# Patient Record
Sex: Female | Born: 1945 | Race: White | Hispanic: No | Marital: Single | Smoking: Never smoker
Health system: Southern US, Community
[De-identification: ages and names within clinical notes are randomized; demographics above are authoritative.]

## PROBLEM LIST (undated history)

## (undated) DIAGNOSIS — R519 Headache, unspecified: Secondary | ICD-10-CM

## (undated) DIAGNOSIS — L0390 Cellulitis, unspecified: Secondary | ICD-10-CM

## (undated) DIAGNOSIS — E039 Hypothyroidism, unspecified: Secondary | ICD-10-CM

## (undated) DIAGNOSIS — E079 Disorder of thyroid, unspecified: Secondary | ICD-10-CM

## (undated) DIAGNOSIS — K529 Noninfective gastroenteritis and colitis, unspecified: Secondary | ICD-10-CM

## (undated) DIAGNOSIS — M109 Gout, unspecified: Secondary | ICD-10-CM

## (undated) DIAGNOSIS — G8929 Other chronic pain: Secondary | ICD-10-CM

## (undated) DIAGNOSIS — E876 Hypokalemia: Secondary | ICD-10-CM

## (undated) DIAGNOSIS — R1310 Dysphagia, unspecified: Secondary | ICD-10-CM

## (undated) DIAGNOSIS — F329 Major depressive disorder, single episode, unspecified: Secondary | ICD-10-CM

## (undated) DIAGNOSIS — F419 Anxiety disorder, unspecified: Secondary | ICD-10-CM

## (undated) DIAGNOSIS — F29 Unspecified psychosis not due to a substance or known physiological condition: Secondary | ICD-10-CM

## (undated) DIAGNOSIS — M810 Age-related osteoporosis without current pathological fracture: Secondary | ICD-10-CM

## (undated) DIAGNOSIS — I82409 Acute embolism and thrombosis of unspecified deep veins of unspecified lower extremity: Secondary | ICD-10-CM

## (undated) DIAGNOSIS — L89309 Pressure ulcer of unspecified buttock, unspecified stage: Secondary | ICD-10-CM

## (undated) DIAGNOSIS — M25551 Pain in right hip: Secondary | ICD-10-CM

## (undated) DIAGNOSIS — T8859XA Other complications of anesthesia, initial encounter: Secondary | ICD-10-CM

## (undated) DIAGNOSIS — J189 Pneumonia, unspecified organism: Secondary | ICD-10-CM

## (undated) DIAGNOSIS — R112 Nausea with vomiting, unspecified: Secondary | ICD-10-CM

## (undated) DIAGNOSIS — R7881 Bacteremia: Secondary | ICD-10-CM

## (undated) DIAGNOSIS — F32A Depression, unspecified: Secondary | ICD-10-CM

## (undated) DIAGNOSIS — T4145XA Adverse effect of unspecified anesthetic, initial encounter: Secondary | ICD-10-CM

## (undated) DIAGNOSIS — D649 Anemia, unspecified: Secondary | ICD-10-CM

## (undated) DIAGNOSIS — Z9889 Other specified postprocedural states: Secondary | ICD-10-CM

## (undated) DIAGNOSIS — M549 Dorsalgia, unspecified: Secondary | ICD-10-CM

## (undated) DIAGNOSIS — K589 Irritable bowel syndrome without diarrhea: Secondary | ICD-10-CM

## (undated) DIAGNOSIS — M6281 Muscle weakness (generalized): Secondary | ICD-10-CM

## (undated) DIAGNOSIS — E785 Hyperlipidemia, unspecified: Secondary | ICD-10-CM

## (undated) DIAGNOSIS — L8990 Pressure ulcer of unspecified site, unspecified stage: Secondary | ICD-10-CM

## (undated) DIAGNOSIS — K219 Gastro-esophageal reflux disease without esophagitis: Secondary | ICD-10-CM

## (undated) HISTORY — DX: Headache, unspecified: R51.9

## (undated) HISTORY — DX: Hypothyroidism, unspecified: E03.9

## (undated) HISTORY — DX: Acute embolism and thrombosis of unspecified deep veins of unspecified lower extremity: I82.409

---

## 2000-07-04 ENCOUNTER — Encounter: Payer: Self-pay | Admitting: Rheumatology

## 2000-07-04 ENCOUNTER — Encounter (HOSPITAL_COMMUNITY): Admission: RE | Admit: 2000-07-04 | Discharge: 2000-08-03 | Payer: Self-pay | Admitting: Rheumatology

## 2000-08-29 ENCOUNTER — Encounter (HOSPITAL_COMMUNITY): Admission: RE | Admit: 2000-08-29 | Discharge: 2000-09-28 | Payer: Self-pay | Admitting: Rheumatology

## 2005-03-26 HISTORY — PX: COLONOSCOPY: SHX174

## 2005-08-03 ENCOUNTER — Ambulatory Visit: Payer: Self-pay | Admitting: Internal Medicine

## 2005-08-29 ENCOUNTER — Ambulatory Visit: Payer: Self-pay | Admitting: Internal Medicine

## 2005-08-29 ENCOUNTER — Encounter (INDEPENDENT_AMBULATORY_CARE_PROVIDER_SITE_OTHER): Payer: Self-pay | Admitting: Specialist

## 2005-08-29 ENCOUNTER — Ambulatory Visit (HOSPITAL_COMMUNITY): Admission: RE | Admit: 2005-08-29 | Discharge: 2005-08-29 | Payer: Self-pay | Admitting: Internal Medicine

## 2005-09-04 ENCOUNTER — Ambulatory Visit (HOSPITAL_COMMUNITY): Admission: RE | Admit: 2005-09-04 | Discharge: 2005-09-04 | Payer: Self-pay | Admitting: Internal Medicine

## 2006-09-12 ENCOUNTER — Ambulatory Visit (HOSPITAL_COMMUNITY): Admission: RE | Admit: 2006-09-12 | Discharge: 2006-09-12 | Payer: Self-pay | Admitting: Internal Medicine

## 2007-01-27 ENCOUNTER — Emergency Department (HOSPITAL_COMMUNITY): Admission: EM | Admit: 2007-01-27 | Discharge: 2007-01-27 | Payer: Self-pay | Admitting: Emergency Medicine

## 2007-04-14 ENCOUNTER — Emergency Department (HOSPITAL_COMMUNITY): Admission: EM | Admit: 2007-04-14 | Discharge: 2007-04-15 | Payer: Self-pay | Admitting: Emergency Medicine

## 2007-09-05 ENCOUNTER — Emergency Department (HOSPITAL_COMMUNITY): Admission: EM | Admit: 2007-09-05 | Discharge: 2007-09-05 | Payer: Self-pay | Admitting: Emergency Medicine

## 2007-10-08 ENCOUNTER — Ambulatory Visit (HOSPITAL_COMMUNITY): Admission: RE | Admit: 2007-10-08 | Discharge: 2007-10-08 | Payer: Self-pay | Admitting: Internal Medicine

## 2008-03-25 ENCOUNTER — Ambulatory Visit (HOSPITAL_COMMUNITY): Admission: RE | Admit: 2008-03-25 | Discharge: 2008-03-25 | Payer: Self-pay | Admitting: Internal Medicine

## 2008-10-19 ENCOUNTER — Ambulatory Visit (HOSPITAL_COMMUNITY): Admission: RE | Admit: 2008-10-19 | Discharge: 2008-10-19 | Payer: Self-pay | Admitting: Internal Medicine

## 2008-10-28 ENCOUNTER — Ambulatory Visit (HOSPITAL_COMMUNITY): Admission: RE | Admit: 2008-10-28 | Discharge: 2008-10-28 | Payer: Self-pay | Admitting: Internal Medicine

## 2010-01-04 ENCOUNTER — Emergency Department (HOSPITAL_COMMUNITY): Admission: EM | Admit: 2010-01-04 | Discharge: 2010-01-04 | Payer: Self-pay | Admitting: Emergency Medicine

## 2010-01-04 ENCOUNTER — Encounter: Payer: Self-pay | Admitting: Orthopedic Surgery

## 2010-01-09 ENCOUNTER — Ambulatory Visit: Payer: Self-pay | Admitting: Orthopedic Surgery

## 2010-01-09 DIAGNOSIS — S52123A Displaced fracture of head of unspecified radius, initial encounter for closed fracture: Secondary | ICD-10-CM | POA: Insufficient documentation

## 2010-01-09 DIAGNOSIS — S53106A Unspecified dislocation of unspecified ulnohumeral joint, initial encounter: Secondary | ICD-10-CM | POA: Insufficient documentation

## 2010-01-16 ENCOUNTER — Encounter: Payer: Self-pay | Admitting: Orthopedic Surgery

## 2010-01-23 ENCOUNTER — Ambulatory Visit: Payer: Self-pay | Admitting: Orthopedic Surgery

## 2010-01-24 ENCOUNTER — Encounter: Payer: Self-pay | Admitting: Orthopedic Surgery

## 2010-01-31 ENCOUNTER — Encounter: Payer: Self-pay | Admitting: Orthopedic Surgery

## 2010-03-11 ENCOUNTER — Emergency Department (HOSPITAL_COMMUNITY)
Admission: EM | Admit: 2010-03-11 | Discharge: 2010-03-11 | Payer: Self-pay | Source: Home / Self Care | Admitting: Emergency Medicine

## 2010-04-25 NOTE — Letter (Signed)
Summary: History form  History form   Imported By: Jacklynn Ganong 01/16/2010 12:10:40  _____________________________________________________________________  External Attachment:    Type:   Image     Comment:   External Document

## 2010-04-25 NOTE — Miscellaneous (Signed)
Summary: Nursing home order  Nursing home order   Imported By: Jacklynn Ganong 01/24/2010 11:26:32  _____________________________________________________________________  External Attachment:    Type:   Image     Comment:   External Document

## 2010-04-25 NOTE — Miscellaneous (Signed)
Summary: Nursing Home order  Nursing Home order   Imported By: Cammie Sickle 01/23/2010 17:40:19  _____________________________________________________________________  External Attachment:    Type:   Image     Comment:   External Document

## 2010-04-25 NOTE — Miscellaneous (Signed)
Summary: order to discontinue medication  order to discontinue medication   Imported By: Jacklynn Ganong 01/31/2010 14:49:37  _____________________________________________________________________  External Attachment:    Type:   Image     Comment:   External Document

## 2010-04-25 NOTE — Miscellaneous (Signed)
Summary: wrote dc for sling and faxed to [facility]  Clinical Lists Changes

## 2010-04-25 NOTE — Assessment & Plan Note (Signed)
Summary: [facility] FOL/UP/FX RT ELBOW/XRAY [facility] 01/04/10/MEDICAID/CAF   Visit Type:  new patient Referring Provider:  [facility]  CC:  right elbow fracture.  History of Present Illness: this is a 1st visit for this 65 year old female, [facility]. She fell on October 13, landed on her RIGHT elbow. The x-ray shows a radial head fracture, nondisplaced.  She complains of throbbing, burning, severe pain and swelling over the RIGHT elbow.  X-rays been, reviewed it's a neck fracture with minimal to no angulation or displacement.  She denies any shoulder pain or wrist pain.  The splint was a little tight, and was starting to interfere with her circulation.  Blood one Synthes plate was removed. The hand pinked up nicely.    Allergies (verified): No Known Drug Allergies  Past History:  Past Medical History: Depression IBS GERD Gout  Past Surgical History: Sinus surgery  Cholecystectomy  Review of Systems Constitutional:  Denies weight loss, weight gain, fever, chills, and fatigue. Cardiovascular:  Denies chest pain, palpitations, fainting, and murmurs. Respiratory:  Denies short of breath, wheezing, couch, tightness, pain on inspiration, and snoring . Gastrointestinal:  Complains of heartburn, diarrhea, and constipation; denies nausea, vomiting, and blood in your stools. Genitourinary:  Denies frequency, urgency, difficulty urinating, painful urination, flank pain, and bleeding in urine. Neurologic:  Complains of dizziness; denies numbness, tingling, unsteady gait, tremors, and seizure. Musculoskeletal:  Complains of joint pain and swelling; denies instability, stiffness, redness, heat, and muscle pain. Endocrine:  Denies excessive thirst, exessive urination, and heat or cold intolerance. Psychiatric:  Complains of nervousness, depression, and anxiety; denies hallucinations. Skin:  Denies changes in the skin, poor healing, rash, itching, and redness. HEENT:  Denies  blurred or double vision, eye pain, redness, and watering. Immunology:  Denies seasonal allergies, sinus problems, and allergic to bee stings. Hemoatologic:  Denies easy bleeding and brusing.  Physical Exam  Additional Exam:  normal development. Adequate grooming, and hygiene. No gross deformity. Patient is found to be in a sling and sugar tong splint in the RIGHT upper extremity.  The radial head is tender with the arm has approximately 50 of elbow flexion. It does not quite come to full extension. There is no instability. Muscle tone is normal. There is a skin abrasion, but no open fracture. The pulse and temperature of the arm are normal, including the radial and ulnar pulse. There is no lymphadenopathy in the axilla. Sensation is normal in the hand. The patient is oriented x3. Her mood is pleasant. Her gait is slow, but normal.  LOWER EXTREMS: Normal alignment and no atrophy, subluxation or tremor or contracture       Impression & Recommendations:  Problem # 1:  CLOSED FRACTURE OF HEAD OF RADIUS (ZOX-096.04) Assessment New  hospital films are as dictated.  Recommend sling for 2 weeks, then x-rayed, and active range of motion as tolerated.  Orders: New Patient Level III (54098) Rad Head Fx (11914)  Patient Instructions: 1)  Please schedule a follow-up appointment in 2 weeks. 2)  xrays of the elbow    Orders Added: 1)  New Patient Level III [99203] 2)  Rad Head Fx [24650]

## 2010-04-25 NOTE — Assessment & Plan Note (Signed)
Summary: 2 WE XR ELBOW/MEDICAID/BSF   Visit Type:  Follow-up Referring Provider:  [facility]  CC:  right elbow fracture.  History of Present Illness:   Xrays today.  ZO:[id number], date of injury October 13  Treatment:sling  MEDS: hydrocodone  Complaints:RIGHT elbow soreness  Today, scheduled for:x-ray, examination  Examination reveals full passive range of motion of the elbow with tenderness over the radial head  AP and lateral RIGHT elbow x-ray show nondisplaced radial head fracture impression nondisplaced radial head fracture in good position  Assessment start range of motion exercises for this nondisplaced RIGHT radial head fracture.  Allergies: No Known Drug Allergies   Other Orders: Post-Op Check (09811) Elbow x-ray, 2 views (91478)  Patient Instructions: 1)  Please schedule a follow-up appointment as needed.   Orders Added: 1)  Post-Op Check [99024] 2)  Elbow x-ray, 2 views [73070]

## 2010-04-25 NOTE — Miscellaneous (Signed)
Summary: Nursing Home Order from Dr. Romeo Apple  Nursing Home Order from Dr. Romeo Apple   Imported By: Jacklynn Ganong 01/16/2010 12:16:07  _____________________________________________________________________  External Attachment:    Type:   Image     Comment:   External Document

## 2010-05-28 ENCOUNTER — Emergency Department (HOSPITAL_COMMUNITY): Payer: Medicare Other

## 2010-05-28 ENCOUNTER — Emergency Department (HOSPITAL_COMMUNITY)
Admission: EM | Admit: 2010-05-28 | Discharge: 2010-05-28 | Disposition: A | Discharge status: Home / Self Care | Payer: Medicare Other | Attending: Emergency Medicine | Admitting: Emergency Medicine

## 2010-05-28 DIAGNOSIS — Z043 Encounter for examination and observation following other accident: Secondary | ICD-10-CM | POA: Insufficient documentation

## 2010-05-28 DIAGNOSIS — K219 Gastro-esophageal reflux disease without esophagitis: Secondary | ICD-10-CM | POA: Insufficient documentation

## 2010-05-28 DIAGNOSIS — W1809XA Striking against other object with subsequent fall, initial encounter: Secondary | ICD-10-CM | POA: Insufficient documentation

## 2010-05-28 DIAGNOSIS — Z79899 Other long term (current) drug therapy: Secondary | ICD-10-CM | POA: Insufficient documentation

## 2010-05-28 DIAGNOSIS — Y9210 Unspecified residential institution as the place of occurrence of the external cause: Secondary | ICD-10-CM | POA: Insufficient documentation

## 2010-05-28 DIAGNOSIS — S2249XA Multiple fractures of ribs, unspecified side, initial encounter for closed fracture: Secondary | ICD-10-CM | POA: Insufficient documentation

## 2010-05-28 DIAGNOSIS — F329 Major depressive disorder, single episode, unspecified: Secondary | ICD-10-CM | POA: Insufficient documentation

## 2010-05-28 DIAGNOSIS — F3289 Other specified depressive episodes: Secondary | ICD-10-CM | POA: Insufficient documentation

## 2010-05-28 LAB — URINALYSIS, ROUTINE W REFLEX MICROSCOPIC
Nitrite: NEGATIVE
Specific Gravity, Urine: 1.01 (ref 1.005–1.030)
pH: 6 (ref 5.0–8.0)

## 2010-05-28 LAB — POCT CARDIAC MARKERS
CKMB, poc: <1 ng/mL — ABNORMAL LOW (ref 1.0–8.0)
Troponin i, poc: <0.05 ng/mL (ref 0.00–0.09)

## 2010-05-28 LAB — BASIC METABOLIC PANEL
GFR calc non Af Amer: >60 mL/min (ref >60)
Potassium: 3.7 mEq/L (ref 3.5–5.1)
Sodium: 141 mEq/L (ref 135–145)

## 2010-05-28 LAB — CBC
HCT: 35.8 % — ABNORMAL LOW (ref 36.0–46.0)
MCHC: 33 g/dL (ref 30.0–36.0)
MCV: 90.2 fL (ref 78.0–100.0)
Platelets: 319 10*3/uL (ref 150–400)
RDW: 13.7 % (ref 11.5–15.5)

## 2010-05-28 LAB — DIFFERENTIAL
Basophils Absolute: 0 10*3/uL (ref 0.0–0.1)
Eosinophils Absolute: 0.1 10*3/uL (ref 0.0–0.7)
Eosinophils Relative: 2 % (ref 0–5)
Lymphocytes Relative: 18 % (ref 12–46)
Lymphs Abs: 1.1 10*3/uL (ref 0.7–4.0)
Monocytes Absolute: 0.7 10*3/uL (ref 0.1–1.0)

## 2010-06-19 ENCOUNTER — Other Ambulatory Visit (HOSPITAL_COMMUNITY): Payer: Self-pay | Admitting: Internal Medicine

## 2010-06-19 ENCOUNTER — Ambulatory Visit (HOSPITAL_COMMUNITY)
Admission: RE | Admit: 2010-06-19 | Discharge: 2010-06-19 | Disposition: A | Discharge status: Home / Self Care | Payer: Medicare Other | Source: Ambulatory Visit | Attending: Internal Medicine | Admitting: Internal Medicine

## 2010-06-19 DIAGNOSIS — M25559 Pain in unspecified hip: Secondary | ICD-10-CM | POA: Insufficient documentation

## 2010-08-11 NOTE — Op Note (Signed)
NAME:  [name], [name]               ACCOUNT NO.:  [id number]   MEDICAL RECORD NO.:  [id number]          PATIENT TYPE:  AMB   LOCATION:  DAY                           FACILITY:  [facility]   PHYSICIAN:  R. Roetta Sessions, M.D. DATE OF BIRTH:  Aug 13, 1945   DATE OF PROCEDURE:  08/29/2005  DATE OF DISCHARGE:                                 OPERATIVE REPORT   PROCEDURE PERFORMED:  Colonoscopy with biopsy, ileoscopy.   INDICATIONS FOR PROCEDURE:  [name] is a 65 year old lady with chronic  diarrhea, intermittent hematochezia, history of colonic adenomatous polyps.  She tells me today what is bothering her the most is she has dysuria and  increased urinary frequency.  She was started on Bentyl 10 mg by mouth each  morning with no improvement in her symptoms recently.  Colonoscopy is now  being done primarily to further evaluate hematochezia.  This approach has been discussed with the patient at length.  Potential  risks, benefits and alternatives have been reviewed, questions have been  answered, she is agreeable.  Please see documentation in the medical  records.   PROCEDURE NOTE:  Oxygen saturations, blood pressure, pulse and respirations  were monitored throughout the entire procedure.   CONSCIOUS SEDATION:  Versed 2 mg IV, Demerol 50 mg IV.   INSTRUMENT USED:  Olympus video chip system.   FINDINGS:  Digital rectal exam revealed no abnormalities.   ENDOSCOPIC FINDINGS:  Prep was good.   Rectum:  Examination of the rectal mucosa including retroflex view of the  anal verge revealed only internal hemorrhoids.  Colon:  Colonic mucosa was surveyed from the rectosigmoid junction to the  left, transverse and right colon to the area of the appendiceal orifice and  ileocecal valve and cecum.  These structures were well seen and photographed  for the record.  The terminal ileum was intubated to 10 cm.  From this  level, the scope was slowly withdrawn.  All previously mentioned mucosal  surfaces were  again seen.  The colonic mucosa appeared entirely normal  except for two adjacent 5 mm polyps at 40 cm.  They were cold  biopsied/removed.  The remainder of the colonic mucosa, distal 10 cm of  terminal ileal mucosa appeared entirely normal.  The patient tolerated the  procedure well, was reacted in endoscopy.   IMPRESSION:  1.  Internal hemorrhoids, otherwise normal rectum.  2.  Diminutive polyps, middescending colon at 40 cm.  Cold biopsied/removed.      Remainder of colonic mucosa to terminal ileum appeared normal.   RECOMMENDATIONS:  1.  Hemorrhoid literature provided to [name].  2.  10-day course of Anusol HC suppositories 1 per rectum at bedtime.  3.  Follow up on pathology.  4.  Further recommendations to follow.      Jonathon Bellows, M.D.  Electronically Signed     RMR/MEDQ  D:  08/29/2005  T:  08/29/2005  Job:  [id number]

## 2010-08-11 NOTE — Consult Note (Signed)
[name], [name]               ACCOUNT NO.:  [id number]   MEDICAL RECORD NO.:  [id number]            PATIENT TYPE:   LOCATION:                                 FACILITY:   PHYSICIAN:  R. Roetta Sessions, M.D.      DATE OF BIRTH:   DATE OF CONSULTATION:  DATE OF DISCHARGE:                                   CONSULTATION   REQUESTING PHYSICIAN:  Dr. Virgina Organ.   REASON FOR CONSULTATION:  Chronic diarrhea.   HISTORY OF PRESENT ILLNESS:  [name] is a 65 year old Caucasian female with  significant psychiatric illness who comes today accompanied by case worker  from mental health. She presents for chronic diarrhea. She is a resident of  [facility] at this time. She states she has had chronic diarrhea  for years. When we saw her back in 2003, she was complaining of the same. In  July 2003, she had a colonoscopy which revealed three small polyps with  tubular adenomas, small external hemorrhoids. Random biopsies were  unremarkable. She also had EGD at the time which revealed small sliding  hiatal hernia, normal esophagus, but her esophagus was stretched with the 54-  [place] Maloney dilator given history of solid-food dysphagia. She said she  is having two to three loose stools a day. She does occasionally not have  diarrhea. She complains of hematochezia. She has abdominal cramping relieved  with defecation. She complains of significant post-prandial diarrhea. She  says she has lost some weight but is not well documented. She does have some  occasional heartburn. Complains of dysuria which has been worked up  extensively with Dr. Virgina Organ. Denies any dysphagia or odynophagia.   CURRENT MEDICATIONS:  1.  Xanax 0.5 mg t.i.d.  2.  Klonopin 0.5 mg t.i.d.  3.  Detrol 4 mg daily.  4.  Levsin 0.25 mg b.i.d.  5.  Premarin vaginally twice a week.  6.  Protonix 40 mg daily.  7.  Zyprexa 15 mg nightly.  8.  Tylenol 2 t.i.d. p.r.n.  9.  Imodium AD 2 p.o. p.r.n.   ALLERGIES:  BENADRYL.   PAST MEDICAL HISTORY:  1.  Major depression, mood disorder, obsessive compulsive disorder.  2.  Vaginitis.  3.  Gastroesophageal reflux disease.  4.  Anxiety.  5.  History of UTIs.  6.  History of IBS.  7.  History of hypothyroidism although not on treatment at this time.  8.  She has had prior sinus surgery.  9.  Bilateral cataract extraction.  10. Cholecystectomy.   SOCIAL HISTORY:  She is divorced. She is on disability secondary to  psychiatric illness. She lives at [facility]. She has a son. Quit  smoking about a year ago. Was smoking two to three packs a day previously.  No alcohol use.   FAMILY HISTORY:  Negative for colorectal cancer. Brother died of lung  cancer.   PHYSICAL EXAMINATION:  VITAL SIGNS:  Weight 131.5, height 5 foot 7,  temperature 98.1, blood pressure 118/68, pulse 76.  GENERAL:  Pleasant, thin, Caucasian female in no acute distress.  SKIN:  Warm and dry. No jaundice.  HEENT:  Conjunctivae are pink. Sclerae are nonicteric. Oropharyngeal mucosa  moist and pink. No lesions, erythema or exudate. No lymphadenopathy or  thyromegaly.  CHEST:  Lungs are clear to auscultation.  CARDIAC:  Reveals regular rate and rhythm. Normal S1 and S2. No murmurs,  rubs, or gallops.  ABDOMEN:  Positive bowel sounds, soft, nontender, nondistended. No  organomegaly or masses. No rebound tenderness or guarding. No abdominal  bruits or hernias.  EXTREMITIES:  No edema.   IMPRESSION:  [name] is a 65 year old lady with chronic diarrhea and  history of intermittent hematochezia. Last colonoscopy was in July of 2003  as outlined above. She did have tubular adenomas at the time. Biopsies were  negative for microscopic colitis. Not mentioned above, she evaluated back in  March 2007 for diarrhea by Dr. Virgina Organ. O&P was negative. She had a  Helicobacter pylori stool antigen which was negative as well, but other  stool studies I do not have any results for, if they were  done. This may  very well be IBS with hematochezia due to hemorrhoids, but with history of  tubular adenomas, we do need to consider colonoscopy at this time. Cannot  exclude possibility of infectious enteritis, given that she lives in a rest  home.   PLAN:  1.  Colonoscopy in the near future.  2.  Will stop her Levsin and trial Bentyl 10 mg p.o. q.a.c., a prescription      for #90, three refills given.  3.  Further recommendations to follow.   Procedure to be performed by Dr. Karilyn Cota, Dr. Jena Gauss co-signer in Dr. Patty Sermons  absence.      Tana Coast, P.AJonathon Bellows, M.D.  Electronically Signed    LL/MEDQ  D:  08/03/2005  T:  08/03/2005  Job:  [id number]

## 2010-12-14 LAB — URINE CULTURE: Colony Count: 10000

## 2010-12-14 LAB — URINALYSIS, ROUTINE W REFLEX MICROSCOPIC
Bilirubin Urine: NEGATIVE
Nitrite: POSITIVE — AB
Specific Gravity, Urine: 1.025
Urobilinogen, UA: 0.2

## 2010-12-14 LAB — URINE MICROSCOPIC-ADD ON

## 2010-12-21 LAB — URIC ACID: Uric Acid, Serum: 2.8

## 2011-01-02 LAB — DIFFERENTIAL
Eosinophils Relative: 3
Lymphocytes Relative: 19
Lymphs Abs: 1.3
Monocytes Absolute: 0.5
Monocytes Relative: 7
Neutro Abs: 5

## 2011-01-02 LAB — COMPREHENSIVE METABOLIC PANEL
BUN: 22
Calcium: 9.1
Creatinine, Ser: 0.69
Glucose, Bld: 107 — ABNORMAL HIGH
Total Protein: 6.3

## 2011-01-02 LAB — URINALYSIS, ROUTINE W REFLEX MICROSCOPIC
Bilirubin Urine: NEGATIVE
Ketones, ur: NEGATIVE
Nitrite: NEGATIVE
Specific Gravity, Urine: 1.02
Urobilinogen, UA: 0.2

## 2011-01-02 LAB — CBC
HCT: 32.7 — ABNORMAL LOW
Hemoglobin: 11.2 — ABNORMAL LOW
MCHC: 34.1
MCV: 88.4
RDW: 14.2 — ABNORMAL HIGH

## 2011-01-02 LAB — URINE MICROSCOPIC-ADD ON

## 2011-03-29 DIAGNOSIS — K589 Irritable bowel syndrome without diarrhea: Secondary | ICD-10-CM

## 2011-03-29 DIAGNOSIS — K219 Gastro-esophageal reflux disease without esophagitis: Secondary | ICD-10-CM

## 2011-03-29 DIAGNOSIS — R32 Unspecified urinary incontinence: Secondary | ICD-10-CM

## 2011-03-29 DIAGNOSIS — R3911 Hesitancy of micturition: Secondary | ICD-10-CM

## 2011-03-29 DIAGNOSIS — R159 Full incontinence of feces: Secondary | ICD-10-CM

## 2011-04-03 DIAGNOSIS — R32 Unspecified urinary incontinence: Secondary | ICD-10-CM

## 2011-04-03 DIAGNOSIS — R3911 Hesitancy of micturition: Secondary | ICD-10-CM

## 2011-04-03 DIAGNOSIS — K219 Gastro-esophageal reflux disease without esophagitis: Secondary | ICD-10-CM

## 2011-04-03 DIAGNOSIS — K589 Irritable bowel syndrome without diarrhea: Secondary | ICD-10-CM

## 2011-04-03 DIAGNOSIS — R159 Full incontinence of feces: Secondary | ICD-10-CM

## 2011-04-29 ENCOUNTER — Inpatient Hospital Stay (HOSPITAL_COMMUNITY)
Admission: EM | Admit: 2011-04-29 | Discharge: 2011-05-04 | DRG: 470 | Disposition: A | Discharge status: Skilled Nursing Facility | Payer: Medicare Other | Attending: Internal Medicine | Admitting: Internal Medicine

## 2011-04-29 ENCOUNTER — Emergency Department (HOSPITAL_COMMUNITY): Payer: Medicare Other

## 2011-04-29 ENCOUNTER — Encounter (HOSPITAL_COMMUNITY): Payer: Self-pay

## 2011-04-29 DIAGNOSIS — S72001A Fracture of unspecified part of neck of right femur, initial encounter for closed fracture: Secondary | ICD-10-CM | POA: Diagnosis present

## 2011-04-29 DIAGNOSIS — K589 Irritable bowel syndrome without diarrhea: Secondary | ICD-10-CM | POA: Diagnosis present

## 2011-04-29 DIAGNOSIS — D649 Anemia, unspecified: Secondary | ICD-10-CM | POA: Diagnosis not present

## 2011-04-29 DIAGNOSIS — K219 Gastro-esophageal reflux disease without esophagitis: Secondary | ICD-10-CM | POA: Diagnosis present

## 2011-04-29 DIAGNOSIS — S72009A Fracture of unspecified part of neck of unspecified femur, initial encounter for closed fracture: Principal | ICD-10-CM | POA: Diagnosis present

## 2011-04-29 DIAGNOSIS — W1809XA Striking against other object with subsequent fall, initial encounter: Secondary | ICD-10-CM | POA: Diagnosis present

## 2011-04-29 DIAGNOSIS — E876 Hypokalemia: Secondary | ICD-10-CM | POA: Diagnosis not present

## 2011-04-29 DIAGNOSIS — M109 Gout, unspecified: Secondary | ICD-10-CM | POA: Diagnosis present

## 2011-04-29 DIAGNOSIS — F341 Dysthymic disorder: Secondary | ICD-10-CM | POA: Diagnosis present

## 2011-04-29 DIAGNOSIS — Y9210 Unspecified residential institution as the place of occurrence of the external cause: Secondary | ICD-10-CM | POA: Diagnosis present

## 2011-04-29 DIAGNOSIS — Z23 Encounter for immunization: Secondary | ICD-10-CM

## 2011-04-29 DIAGNOSIS — W19XXXA Unspecified fall, initial encounter: Secondary | ICD-10-CM | POA: Diagnosis present

## 2011-04-29 HISTORY — DX: Irritable bowel syndrome, unspecified: K58.9

## 2011-04-29 HISTORY — DX: Major depressive disorder, single episode, unspecified: F32.9

## 2011-04-29 HISTORY — DX: Gastro-esophageal reflux disease without esophagitis: K21.9

## 2011-04-29 HISTORY — DX: Depression, unspecified: F32.A

## 2011-04-29 MED ORDER — ONDANSETRON HCL 4 MG/2ML IJ SOLN
4.0000 mg | Freq: Once | INTRAMUSCULAR | Status: AC
  Administered 2011-04-30: 4 mg via INTRAVENOUS
  Filled 2011-04-29: qty 2

## 2011-04-29 MED ORDER — MORPHINE SULFATE 2 MG/ML IJ SOLN
2.0000 mg | Freq: Once | INTRAMUSCULAR | Status: AC
  Administered 2011-04-30: 2 mg via INTRAVENOUS
  Filled 2011-04-29: qty 1

## 2011-04-29 NOTE — ED Notes (Signed)
Pt presents with head pain, right arm and leg pain after falling. Pt is a [employer] pt.

## 2011-04-29 NOTE — ED Provider Notes (Signed)
History     CSN: [id number]  Arrival date & time 04/29/11  2107   First MD Initiated Contact with Patient 04/29/11 2144      Chief Complaint  Patient presents with  . Fall  . Head Injury  . Leg Pain  . Arm Pain    (Consider location/radiation/quality/duration/timing/severity/associated sxs/prior treatment) HPI Comments: Patient presents for evaluation of a fall that she took just prior to arrival this evening. She describes trying to put on her hands in order to walk to the bathroom, tripped and landed against her were wardrobe. She has pain in her her right shoulder, right upper thigh and hip, and she reports hitting her right head on the wardrobe, but denies head pain. She denies loss of consciousness, dizziness, nausea or vomiting. She states she cannot bear weight on her right leg without severe pain. Pain is constant but worse with movement.    Patient is a 66 y.o. female presenting with fall, head injury, leg pain, and arm pain. The history is provided by the patient.  Fall Pertinent negatives include no fever, no numbness, no abdominal pain, no nausea and no headaches.  Head Injury  Pertinent negatives include no numbness and no weakness.  Leg Pain  Pertinent negatives include no numbness.  Arm Pain Associated symptoms include arthralgias. Pertinent negatives include no abdominal pain, chest pain, congestion, fever, headaches, joint swelling, nausea, neck pain, numbness, rash, sore throat or weakness.    Past Medical History  Diagnosis Date  . IBS (irritable bowel syndrome)   . Depression   . GERD (gastroesophageal reflux disease)   . Gout     History reviewed. No pertinent past surgical history.  No family history on file.  History  Substance Use Topics  . Smoking status: Never Smoker   . Smokeless tobacco: Not on file  . Alcohol Use: No    OB History    Grav Para Term Preterm Abortions TAB SAB Ect Mult Living                  Review of Systems    Constitutional: Negative for fever.  HENT: Negative for congestion, sore throat and neck pain.   Eyes: Negative.   Respiratory: Negative for chest tightness and shortness of breath.   Cardiovascular: Negative for chest pain.  Gastrointestinal: Negative for nausea and abdominal pain.  Genitourinary: Negative.   Musculoskeletal: Positive for arthralgias. Negative for joint swelling.  Skin: Negative.  Negative for rash and wound.  Neurological: Negative for dizziness, weakness, light-headedness, numbness and headaches.  Hematological: Negative.   Psychiatric/Behavioral: Negative.     Allergies  Review of patient's allergies indicates no known allergies.  Home Medications  No current outpatient prescriptions on file.  BP 99/60  Pulse 56  Temp(Src) 97.5 F (36.4 C) (Oral)  Resp 18  Ht 5\' 7"  (1.702 m)  Wt 150 lb (68.04 kg)  BMI 23.49 kg/m2  SpO2 98%  Physical Exam  Nursing note and vitals reviewed. Constitutional: She is oriented to person, place, and time. She appears well-developed and well-nourished.  HENT:  Head: Normocephalic and atraumatic.  Eyes: Conjunctivae are normal.  Neck: Normal range of motion.  Cardiovascular: Normal rate, regular rhythm, normal heart sounds and intact distal pulses.   Pulmonary/Chest: Effort normal and breath sounds normal. She has no wheezes.  Abdominal: Soft. Bowel sounds are normal. There is no tenderness.  Musculoskeletal: She exhibits tenderness.       Right shoulder: She exhibits tenderness.  Right hip: She exhibits tenderness. She exhibits no swelling.       Right leg is shortened and externally rotated. Bilateral pedal pulses are intact.  Neurological: She is alert and oriented to person, place, and time.  Skin: Skin is warm and dry.  Psychiatric: She has a normal mood and affect.    ED Course  Procedures (including critical care time)  Results for orders placed during the hospital encounter of 04/29/11  CBC       Component Value Range   WBC 9.1  4.0 - 10.5 (K/uL)   RBC 4.16  3.87 - 5.11 (MIL/uL)   Hemoglobin 12.5  12.0 - 15.0 (g/dL)   HCT 78.2  95.6 - 21.3 (%)   MCV 90.6  78.0 - 100.0 (fL)   MCH 30.0  26.0 - 34.0 (pg)   MCHC 33.2  30.0 - 36.0 (g/dL)   RDW 08.6  57.8 - 46.9 (%)   Platelets 231  150 - 400 (K/uL)  DIFFERENTIAL      Component Value Range   Neutrophils Relative 83 (*) 43 - 77 (%)   Neutro Abs 7.5  1.7 - 7.7 (K/uL)   Lymphocytes Relative 11 (*) 12 - 46 (%)   Lymphs Abs 1.0  0.7 - 4.0 (K/uL)   Monocytes Relative 5  3 - 12 (%)   Monocytes Absolute 0.4  0.1 - 1.0 (K/uL)   Eosinophils Relative 1  0 - 5 (%)   Eosinophils Absolute 0.1  0.0 - 0.7 (K/uL)   Basophils Relative 0  0 - 1 (%)   Basophils Absolute 0.0  0.0 - 0.1 (K/uL)  BASIC METABOLIC PANEL      Component Value Range   Sodium 134 (*) 135 - 145 (mEq/L)   Potassium 3.5  3.5 - 5.1 (mEq/L)   Chloride 98  96 - 112 (mEq/L)   CO2 25  19 - 32 (mEq/L)   Glucose, Bld 120 (*) 70 - 99 (mg/dL)   BUN 7  6 - 23 (mg/dL)   Creatinine, Ser 6.29  0.50 - 1.10 (mg/dL)   Calcium 9.7  8.4 - 52.8 (mg/dL)   GFR calc non Af Amer 65 (*) >90 (mL/min)   GFR calc Af Amer 76 (*) >90 (mL/min)  PROTIME-INR      Component Value Range   Prothrombin Time 13.4  11.6 - 15.2 (seconds)   INR 1.00  0.00 - 1.49       Dg Chest 1 View  04/29/2011  *RADIOLOGY REPORT*  Clinical Data: Larey Seat, contusion.  CHEST - 1 VIEW  Comparison: 05/28/2010  Findings: Old left rib fractures.  Lungs clear.  Heart size upper limits normal.  No effusion.  No pneumothorax.  IMPRESSION:  1.  No acute disease.  Original Report Authenticated By: Osa Craver, M.D.   Dg Shoulder Right  04/29/2011  *RADIOLOGY REPORT*  Clinical Data: Pain post fall.  RIGHT SHOULDER - 2+ VIEW  Comparison: None.  Findings: Negative for fracture, dislocation, or other acute abnormality.  Normal alignment and mineralization. No significant degenerative change.  Regional soft tissues unremarkable.   IMPRESSION:  Negative  Original Report Authenticated By: Osa Craver, M.D.   Dg Hip Complete Right  04/29/2011  *RADIOLOGY REPORT*  Clinical Data: Right hip pain status post fall  RIGHT HIP - COMPLETE 2+ VIEW  Comparison: 06/19/2010  Findings: Right femoral neck fracture with proximal migration of the distal component.  The femoral head remains seated within the acetabulum.  The pubic  rami remain intact.  No aggressive osseous lesion identified.  IMPRESSION: Right femoral neck fracture.  Original Report Authenticated By: Waneta Martins, M.D.   Ct Head Wo Contrast  04/29/2011  *RADIOLOGY REPORT*  Clinical Data: Status post fall, head pain.  CT HEAD WITHOUT CONTRAST,CT CERVICAL SPINE WITHOUT CONTRAST  Technique:  Contiguous axial images were obtained from the base of the skull through the vertex without contrast.,Technique: Multidetector CT imaging of the cervical spine was performed. Multiplanar CT image reconstructions were also generated.  Comparison: None.  Findings:  Head: Prominence of the sulci, cisterns, and ventricles, in keeping with volume loss. There are subcortical and periventricular white matter hypodensities, a nonspecific finding most often seen with chronic microangiopathic changes.  There is no evidence for acute hemorrhage, overt hydrocephalus, mass lesion, or abnormal extra-axial fluid collection.  No definite CT evidence for acute cortical based (large artery) infarction. The visualized paranasal sinuses and mastoid air cells are predominately clear.  No displaced calvarial fracture.  Cervical spine:  C1 bone island.  No acute fracture or dislocation. Maintained craniocervical relationship.  Mild multilevel degenerative changes with anterior osteophyte formation at C5-6. No central canal narrowing.  Lung apices are clear.  No prevertebral soft tissue swelling.  IMPRESSION: White matter hypodensities, a nonspecific finding most often seen with chronic microangiopathic change.  No  definite acute intracranial abnormality.  No acute fracture or dislocation of the cervical spine.  Original Report Authenticated By: Waneta Martins, M.D.   Ct Cervical Spine Wo Contrast  04/29/2011  *RADIOLOGY REPORT*  Clinical Data: Status post fall, head pain.  CT HEAD WITHOUT CONTRAST,CT CERVICAL SPINE WITHOUT CONTRAST  Technique:  Contiguous axial images were obtained from the base of the skull through the vertex without contrast.,Technique: Multidetector CT imaging of the cervical spine was performed. Multiplanar CT image reconstructions were also generated.  Comparison: None.  Findings:  Head: Prominence of the sulci, cisterns, and ventricles, in keeping with volume loss. There are subcortical and periventricular white matter hypodensities, a nonspecific finding most often seen with chronic microangiopathic changes.  There is no evidence for acute hemorrhage, overt hydrocephalus, mass lesion, or abnormal extra-axial fluid collection.  No definite CT evidence for acute cortical based (large artery) infarction. The visualized paranasal sinuses and mastoid air cells are predominately clear.  No displaced calvarial fracture.  Cervical spine:  C1 bone island.  No acute fracture or dislocation. Maintained craniocervical relationship.  Mild multilevel degenerative changes with anterior osteophyte formation at C5-6. No central canal narrowing.  Lung apices are clear.  No prevertebral soft tissue swelling.  IMPRESSION: White matter hypodensities, a nonspecific finding most often seen with chronic microangiopathic change.  No definite acute intracranial abnormality.  No acute fracture or dislocation of the cervical spine.  Original Report Authenticated By: Waneta Martins, M.D.     1. Hip fracture       MDM  Spoke with Dr. Romeo Apple regarding patient's injury. He will consult with this patient, requests medical admission.  Dr. Colon Branch spoke with Dr. Onalee Hua with [facility] hospitalist, who will admit this  patient.        Candis Musa, PA 04/30/11 0006    Date: 04/30/2011  Rate: 66  Rhythm: normal sinus rhythm  QRS Axis: normal  Intervals: normal  ST/T Wave abnormalities: normal  Conduction Disutrbances:none  Narrative Interpretation:   Old EKG Reviewed: unchanged    Candis Musa, PA 04/30/11 0043  Candis Musa, PA 04/30/11 0126

## 2011-04-29 NOTE — ED Notes (Addendum)
Pt from [facility] and was changing clothes at facility and fell, lac to posterior of head, c/o right knee, right elbow and right upper thigh/ hip pain since fall, small skin tear noted to right elbow as well; reported that pt not able to put weight on right leg

## 2011-04-29 NOTE — ED Provider Notes (Addendum)
Patient is nursing home patient who fell. Pain to head with no lesions. Pain to right arm with no lesions. Pain to right leg with focal tenderness at the hip with limited ROM due to pain. CT  head  And cervical spine negative.Right hip with femoral neck fracture. Patient will be admitted.   Medical screening examination/treatment/procedure(s) were conducted as a shared visit with non-physician practitioner(s) and myself.  I personally evaluated the patient during the encounter.  Nicoletta Dress. Colon Branch, MD 04/29/11 1610  [id number] Midlevel spoke with Dr. Romeo Apple who will consult on the patient. He has requested a medical admission.  [id number] Spoke with Dr. Onalee Hua who will admit patient on behalf of Dr. Felecia Shelling.Patient to med surg bed.She asked why patient was in a nursing home. Patient advised that she is there for psychiatric history and inability to care for herself.   Nicoletta Dress. Colon Branch, MD 04/30/11 [phone]

## 2011-04-30 ENCOUNTER — Other Ambulatory Visit: Payer: Self-pay

## 2011-04-30 ENCOUNTER — Encounter (HOSPITAL_COMMUNITY): Payer: Self-pay | Admitting: *Deleted

## 2011-04-30 DIAGNOSIS — S72001A Fracture of unspecified part of neck of right femur, initial encounter for closed fracture: Secondary | ICD-10-CM | POA: Diagnosis present

## 2011-04-30 DIAGNOSIS — W19XXXA Unspecified fall, initial encounter: Secondary | ICD-10-CM | POA: Diagnosis present

## 2011-04-30 DIAGNOSIS — S72009A Fracture of unspecified part of neck of unspecified femur, initial encounter for closed fracture: Principal | ICD-10-CM

## 2011-04-30 LAB — BASIC METABOLIC PANEL
BUN: 7 mg/dL (ref 6–23)
BUN: 7 mg/dL (ref 6–23)
CO2: 25 mEq/L (ref 19–32)
CO2: 25 mEq/L (ref 19–32)
Calcium: 9.7 mg/dL (ref 8.4–10.5)
Chloride: 102 mEq/L (ref 96–112)
Chloride: 98 mEq/L (ref 96–112)
Creatinine, Ser: 0.9 mg/dL (ref 0.50–1.10)
GFR calc Af Amer: 76 mL/min — ABNORMAL LOW (ref >90)
GFR calc Af Amer: >90 mL/min (ref >90)
Glucose, Bld: 148 mg/dL — ABNORMAL HIGH (ref 70–99)
Potassium: 3.5 mEq/L (ref 3.5–5.1)

## 2011-04-30 LAB — URINALYSIS, ROUTINE W REFLEX MICROSCOPIC
Bilirubin Urine: NEGATIVE
Glucose, UA: NEGATIVE mg/dL
Ketones, ur: NEGATIVE mg/dL
Leukocytes, UA: NEGATIVE
Protein, ur: NEGATIVE mg/dL
pH: 6 (ref 5.0–8.0)

## 2011-04-30 LAB — DIFFERENTIAL
Basophils Absolute: 0 10*3/uL (ref 0.0–0.1)
Basophils Relative: 0 % (ref 0–1)
Eosinophils Relative: 1 % (ref 0–5)
Monocytes Absolute: 0.4 10*3/uL (ref 0.1–1.0)
Monocytes Relative: 5 % (ref 3–12)
Neutro Abs: 7.5 10*3/uL (ref 1.7–7.7)

## 2011-04-30 LAB — CBC
HCT: 35.1 % — ABNORMAL LOW (ref 36.0–46.0)
HCT: 37.7 % (ref 36.0–46.0)
Hemoglobin: 12 g/dL (ref 12.0–15.0)
Hemoglobin: 12.5 g/dL (ref 12.0–15.0)
MCH: 30.8 pg (ref 26.0–34.0)
MCHC: 33.2 g/dL (ref 30.0–36.0)
MCHC: 34.2 g/dL (ref 30.0–36.0)
MCV: 90 fL (ref 78.0–100.0)
MCV: 90.6 fL (ref 78.0–100.0)
RDW: 13.5 % (ref 11.5–15.5)

## 2011-04-30 LAB — ABO/RH: ABO/RH(D): O POS

## 2011-04-30 MED ORDER — SODIUM CHLORIDE 0.9 % IV SOLN: INTRAVENOUS | Status: DC

## 2011-04-30 MED ORDER — POTASSIUM CHLORIDE CRYS ER 10 MEQ PO TBCR
30.0000 meq | EXTENDED_RELEASE_TABLET | Freq: Two times a day (BID) | ORAL | Status: DC
  Administered 2011-04-30 (×2): 30 meq via ORAL
  Filled 2011-04-30: qty 1
  Filled 2011-04-30: qty 3

## 2011-04-30 MED ORDER — POTASSIUM CHLORIDE IN NACL 20-0.9 MEQ/L-% IV SOLN
INTRAVENOUS | Status: AC
  Administered 2011-04-30: 1000 mL via INTRAVENOUS

## 2011-04-30 MED ORDER — HYDROMORPHONE HCL PF 1 MG/ML IJ SOLN
1.0000 mg | INTRAMUSCULAR | Status: DC | PRN
  Administered 2011-04-30 – 2011-05-01 (×6): 1 mg via INTRAVENOUS
  Filled 2011-04-30 (×7): qty 1

## 2011-04-30 MED ORDER — CEFAZOLIN SODIUM 1-5 GM-% IV SOLN: 1.0000 g | INTRAVENOUS | Status: DC

## 2011-04-30 MED ORDER — SODIUM CHLORIDE 0.9 % IV SOLN
INTRAVENOUS | Status: AC
  Administered 2011-04-30: 1000 mL via INTRAVENOUS

## 2011-04-30 MED ORDER — ONDANSETRON HCL 4 MG/2ML IJ SOLN
4.0000 mg | Freq: Four times a day (QID) | INTRAMUSCULAR | Status: DC | PRN
  Administered 2011-04-30 – 2011-05-01 (×2): 4 mg via INTRAVENOUS
  Filled 2011-04-30 (×2): qty 2

## 2011-04-30 MED ORDER — ONDANSETRON HCL 4 MG PO TABS
4.0000 mg | ORAL_TABLET | Freq: Four times a day (QID) | ORAL | Status: DC | PRN
  Administered 2011-04-30: 4 mg via ORAL
  Filled 2011-04-30: qty 1

## 2011-04-30 MED ORDER — CHLORHEXIDINE GLUCONATE 4 % EX LIQD: 60.0000 mL | Freq: Once | CUTANEOUS | Status: DC

## 2011-04-30 NOTE — H&P (Signed)
PCP:   Avon Gully, MD, MD   Chief Complaint:  Mechanical fall  HPI: 66 year old female who has a history of depression, GERD, anxiety issues who lives in an assisted living for really unclear reasons. She cannot say why she lives there but is also not oriented to time and I do not know at her baseline mental status is. She did get up however to try on some pants earlier tonight and fail and hurt her right hip and subsequently now has a right hip fracture. She denies passing out. However on not really sure how reliable her history is. There is no record of dementia in her records sent from the assisted living. Only what is listed here in her past medical history. She denies any history of dementia and says that she has a history of major depression only. She denies running any fevers any recent illnesses nausea vomiting or diarrhea.  Review of Systems:  Otherwise unobtainable  Past Medical History: Past Medical History  Diagnosis Date  . IBS (irritable bowel syndrome)   . Depression   . GERD (gastroesophageal reflux disease)   . Gout    History reviewed. No pertinent past surgical history.  Medications: Prior to Admission medications   Not on File    Allergies:  No Known Allergies  Social History:  reports that she has never smoked. She does not have any smokeless tobacco history on file. She reports that she does not drink alcohol or use illicit drugs.  Family History: No family history on file.  Physical Exam: Filed Vitals:   04/29/11 2117 04/30/11 0051  BP: 99/60 102/60  Pulse: 56 60  Temp: 97.5 F (36.4 C) 99.2 F (37.3 C)  TempSrc: Oral Oral  Resp: 18 18  Height: 5\' 7"  (1.702 m)   Weight: 68.04 kg (150 lb)   SpO2: 98% 93%   BP 109/69  Pulse 55  Temp(Src) 98 F (36.7 C) (Oral)  Resp 18  Ht 5\' 7"  (1.702 m)  Wt 68.4 kg (150 lb 12.7 oz)  BMI 23.62 kg/m2  SpO2 93% General appearance: alert, cooperative and no distress Lungs: clear to auscultation  bilaterally Heart: regular rate and rhythm, S1, S2 normal, no murmur, click, rub or gallop Abdomen: soft, non-tender; bowel sounds normal; no masses,  no organomegaly Extremities: extremities normal, atraumatic, no cyanosis or edema Pulses: 2+ and symmetric Skin: Skin color, texture, turgor normal. No rashes or lesions Neurologic: Grossly normal oriented to person and to place but not to time she thinks the years 2010. She does not recall the name of her only son or her grandchildren. Unclear as to whether this is because she never sees her family her because she has forgotten.    Labs on Admission:   [facility] 04/30/11 0002  NA 134*  K 3.5  CL 98  CO2 25  GLUCOSE 120*  BUN 7  CREATININE 0.90  CALCIUM 9.7  MG --  PHOS --    Basename 04/30/11 0002  WBC 9.1  NEUTROABS 7.5  HGB 12.5  HCT 37.7  MCV 90.6  PLT 231    Radiological Exams on Admission: Dg Chest 1 View  04/29/2011  *RADIOLOGY REPORT*  Clinical Data: Larey Seat, contusion.  CHEST - 1 VIEW  Comparison: 05/28/2010  Findings: Old left rib fractures.  Lungs clear.  Heart size upper limits normal.  No effusion.  No pneumothorax.  IMPRESSION:  1.  No acute disease.  Original Report Authenticated By: Osa Craver, M.D.   Dg Shoulder  Right  04/29/2011  *RADIOLOGY REPORT*  Clinical Data: Pain post fall.  RIGHT SHOULDER - 2+ VIEW  Comparison: None.  Findings: Negative for fracture, dislocation, or other acute abnormality.  Normal alignment and mineralization. No significant degenerative change.  Regional soft tissues unremarkable.  IMPRESSION:  Negative  Original Report Authenticated By: Osa Craver, M.D.   Dg Hip Complete Right  04/29/2011  *RADIOLOGY REPORT*  Clinical Data: Right hip pain status post fall  RIGHT HIP - COMPLETE 2+ VIEW  Comparison: 06/19/2010  Findings: Right femoral neck fracture with proximal migration of the distal component.  The femoral head remains seated within the acetabulum.  The pubic rami  remain intact.  No aggressive osseous lesion identified.  IMPRESSION: Right femoral neck fracture.  Original Report Authenticated By: Waneta Martins, M.D.   Ct Head Wo Contrast  04/29/2011  *RADIOLOGY REPORT*  Clinical Data: Status post fall, head pain.  CT HEAD WITHOUT CONTRAST,CT CERVICAL SPINE WITHOUT CONTRAST  Technique:  Contiguous axial images were obtained from the base of the skull through the vertex without contrast.,Technique: Multidetector CT imaging of the cervical spine was performed. Multiplanar CT image reconstructions were also generated.  Comparison: None.  Findings:  Head: Prominence of the sulci, cisterns, and ventricles, in keeping with volume loss. There are subcortical and periventricular white matter hypodensities, a nonspecific finding most often seen with chronic microangiopathic changes.  There is no evidence for acute hemorrhage, overt hydrocephalus, mass lesion, or abnormal extra-axial fluid collection.  No definite CT evidence for acute cortical based (large artery) infarction. The visualized paranasal sinuses and mastoid air cells are predominately clear.  No displaced calvarial fracture.  Cervical spine:  C1 bone island.  No acute fracture or dislocation. Maintained craniocervical relationship.  Mild multilevel degenerative changes with anterior osteophyte formation at C5-6. No central canal narrowing.  Lung apices are clear.  No prevertebral soft tissue swelling.  IMPRESSION: White matter hypodensities, a nonspecific finding most often seen with chronic microangiopathic change.  No definite acute intracranial abnormality.  No acute fracture or dislocation of the cervical spine.  Original Report Authenticated By: Waneta Martins, M.D.   Ct Cervical Spine Wo Contrast  04/29/2011  *RADIOLOGY REPORT*  Clinical Data: Status post fall, head pain.  CT HEAD WITHOUT CONTRAST,CT CERVICAL SPINE WITHOUT CONTRAST  Technique:  Contiguous axial images were obtained from the base of the  skull through the vertex without contrast.,Technique: Multidetector CT imaging of the cervical spine was performed. Multiplanar CT image reconstructions were also generated.  Comparison: None.  Findings:  Head: Prominence of the sulci, cisterns, and ventricles, in keeping with volume loss. There are subcortical and periventricular white matter hypodensities, a nonspecific finding most often seen with chronic microangiopathic changes.  There is no evidence for acute hemorrhage, overt hydrocephalus, mass lesion, or abnormal extra-axial fluid collection.  No definite CT evidence for acute cortical based (large artery) infarction. The visualized paranasal sinuses and mastoid air cells are predominately clear.  No displaced calvarial fracture.  Cervical spine:  C1 bone island.  No acute fracture or dislocation. Maintained craniocervical relationship.  Mild multilevel degenerative changes with anterior osteophyte formation at C5-6. No central canal narrowing.  Lung apices are clear.  No prevertebral soft tissue swelling.  IMPRESSION: White matter hypodensities, a nonspecific finding most often seen with chronic microangiopathic change.  No definite acute intracranial abnormality.  No acute fracture or dislocation of the cervical spine.  Original Report Authenticated By: Waneta Martins, M.D.    Assessment/Plan Present  on Admission:  66 year old female who fell reported by the assisted living and has a subsequent right hip fracture  .Fall mechanical  .Hip fracture, right auricle consult Place n.p.o. Clarify what her baseline mental status is and why she is in assisted living. Her medical history is really not that significant to explain why she is requiring assisted living. She does say that she was under mental health services and a guy named Barbara Cower who is her counselor put her in assisted living but she doesn't really know why. I'm not sure she is a ward of the state and who her healthcare power of attorney is.  We'll obtain social work consult to try to figure this out from the assisted living. Presumptive full code.   Jazzmon Prindle [phone] 04/30/2011, 2:39 AM

## 2011-04-30 NOTE — Consult Note (Signed)
Reason for Consult:fracture right hip  Referring Physician: [name] is an 66 y.o. female.  HPI: 66 [facility], has severe dementia presents with an unexplained fall. Initial emergency room evaluation revealed the patient had right hip fracture. She also has severe depression. Is unclear who admitted her to the assisted living she cannot give a history.  Past Medical History  Diagnosis Date  . IBS (irritable bowel syndrome)   . Depression   . GERD (gastroesophageal reflux disease)   . Gout     History reviewed. No pertinent past surgical history.  History reviewed. No pertinent family history.  Social History:  reports that she has never smoked. She does not have any smokeless tobacco history on file. She reports that she does not drink alcohol or use illicit drugs.  Allergies:  Allergies  Allergen Reactions  . Diphenhydramine Other (See Comments)    Feels bad    Medications: I have reviewed the patient's current medications.  Results for orders placed during the hospital encounter of 04/29/11 (from the past 48 hour(s))  CBC     Status: Normal   Collection Time   04/30/11 12:02 AM      Component Value Range Comment   WBC 9.1  4.0 - 10.5 (K/uL)    RBC 4.16  3.87 - 5.11 (MIL/uL)    Hemoglobin 12.5  12.0 - 15.0 (g/dL)    HCT 16.1  09.6 - 04.5 (%)    MCV 90.6  78.0 - 100.0 (fL)    MCH 30.0  26.0 - 34.0 (pg)    MCHC 33.2  30.0 - 36.0 (g/dL)    RDW 40.9  81.1 - 91.4 (%)    Platelets 231  150 - 400 (K/uL)   DIFFERENTIAL     Status: Abnormal   Collection Time   04/30/11 12:02 AM      Component Value Range Comment   Neutrophils Relative 83 (*) 43 - 77 (%)    Neutro Abs 7.5  1.7 - 7.7 (K/uL)    Lymphocytes Relative 11 (*) 12 - 46 (%)    Lymphs Abs 1.0  0.7 - 4.0 (K/uL)    Monocytes Relative 5  3 - 12 (%)    Monocytes Absolute 0.4  0.1 - 1.0 (K/uL)    Eosinophils Relative 1  0 - 5 (%)    Eosinophils Absolute 0.1  0.0 - 0.7  (K/uL)    Basophils Relative 0  0 - 1 (%)    Basophils Absolute 0.0  0.0 - 0.1 (K/uL)   BASIC METABOLIC PANEL     Status: Abnormal   Collection Time   04/30/11 12:02 AM      Component Value Range Comment   Sodium 134 (*) 135 - 145 (mEq/L)    Potassium 3.5  3.5 - 5.1 (mEq/L)    Chloride 98  96 - 112 (mEq/L)    CO2 25  19 - 32 (mEq/L)    Glucose, Bld 120 (*) 70 - 99 (mg/dL)    BUN 7  6 - 23 (mg/dL)    Creatinine, Ser 7.82  0.50 - 1.10 (mg/dL)    Calcium 9.7  8.4 - 10.5 (mg/dL)    GFR calc non Af Amer 65 (*) >90 (mL/min)    GFR calc Af Amer 76 (*) >90 (mL/min)   PROTIME-INR     Status: Normal   Collection Time   04/30/11 12:02 AM      Component Value  Range Comment   Prothrombin Time 13.4  11.6 - 15.2 (seconds)    INR 1.00  0.00 - 1.49    URINALYSIS, ROUTINE W REFLEX MICROSCOPIC     Status: Abnormal   Collection Time   04/30/11  1:24 AM      Component Value Range Comment   Color, Urine YELLOW  YELLOW     APPearance CLEAR  CLEAR     Specific Gravity, Urine <1.005 (*) 1.005 - 1.030     pH 6.0  5.0 - 8.0     Glucose, UA NEGATIVE  NEGATIVE (mg/dL)    Hgb urine dipstick TRACE (*) NEGATIVE     Bilirubin Urine NEGATIVE  NEGATIVE     Ketones, ur NEGATIVE  NEGATIVE (mg/dL)    Protein, ur NEGATIVE  NEGATIVE (mg/dL)    Urobilinogen, UA 0.2  0.0 - 1.0 (mg/dL)    Nitrite NEGATIVE  NEGATIVE     Leukocytes, UA NEGATIVE  NEGATIVE    URINE MICROSCOPIC-ADD ON     Status: Abnormal   Collection Time   04/30/11  1:24 AM      Component Value Range Comment   Squamous Epithelial / LPF FEW (*) RARE     WBC, UA 0-2  <3 (WBC/hpf)    RBC / HPF 0-2  <3 (RBC/hpf)    Bacteria, UA RARE  RARE    BASIC METABOLIC PANEL     Status: Abnormal   Collection Time   04/30/11  4:50 AM      Component Value Range Comment   Sodium 136  135 - 145 (mEq/L)    Potassium 3.5  3.5 - 5.1 (mEq/L)    Chloride 102  96 - 112 (mEq/L)    CO2 25  19 - 32 (mEq/L)    Glucose, Bld 148 (*) 70 - 99 (mg/dL)    BUN 7  6 - 23 (mg/dL)     Creatinine, Ser 4.09  0.50 - 1.10 (mg/dL)    Calcium 9.4  8.4 - 10.5 (mg/dL)    GFR calc non Af Amer 87 (*) >90 (mL/min)    GFR calc Af Amer >90  >90 (mL/min)   CBC     Status: Abnormal   Collection Time   04/30/11  4:50 AM      Component Value Range Comment   WBC 7.3  4.0 - 10.5 (K/uL)    RBC 3.90  3.87 - 5.11 (MIL/uL)    Hemoglobin 12.0  12.0 - 15.0 (g/dL)    HCT 81.1 (*) 91.4 - 46.0 (%)    MCV 90.0  78.0 - 100.0 (fL)    MCH 30.8  26.0 - 34.0 (pg)    MCHC 34.2  30.0 - 36.0 (g/dL)    RDW 78.2  95.6 - 21.3 (%)    Platelets 235  150 - 400 (K/uL)     Dg Chest 1 View  04/29/2011  *RADIOLOGY REPORT*  Clinical Data: Larey Seat, contusion.  CHEST - 1 VIEW  Comparison: 05/28/2010  Findings: Old left rib fractures.  Lungs clear.  Heart size upper limits normal.  No effusion.  No pneumothorax.  IMPRESSION:  1.  No acute disease.  Original Report Authenticated By: Osa Craver, M.D.   Dg Shoulder Right  04/29/2011  *RADIOLOGY REPORT*  Clinical Data: Pain post fall.  RIGHT SHOULDER - 2+ VIEW  Comparison: None.  Findings: Negative for fracture, dislocation, or other acute abnormality.  Normal alignment and mineralization. No significant degenerative change.  Regional soft tissues unremarkable.  IMPRESSION:  Negative  Original Report Authenticated By: Osa Craver, M.D.   Dg Hip Complete Right  04/29/2011  *RADIOLOGY REPORT*  Clinical Data: Right hip pain status post fall  RIGHT HIP - COMPLETE 2+ VIEW  Comparison: 06/19/2010  Findings: Right femoral neck fracture with proximal migration of the distal component.  The femoral head remains seated within the acetabulum.  The pubic rami remain intact.  No aggressive osseous lesion identified.  IMPRESSION: Right femoral neck fracture.  Original Report Authenticated By: Waneta Martins, M.D.   Ct Head Wo Contrast  04/29/2011  *RADIOLOGY REPORT*  Clinical Data: Status post fall, head pain.  CT HEAD WITHOUT CONTRAST,CT CERVICAL SPINE WITHOUT CONTRAST   Technique:  Contiguous axial images were obtained from the base of the skull through the vertex without contrast.,Technique: Multidetector CT imaging of the cervical spine was performed. Multiplanar CT image reconstructions were also generated.  Comparison: None.  Findings:  Head: Prominence of the sulci, cisterns, and ventricles, in keeping with volume loss. There are subcortical and periventricular white matter hypodensities, a nonspecific finding most often seen with chronic microangiopathic changes.  There is no evidence for acute hemorrhage, overt hydrocephalus, mass lesion, or abnormal extra-axial fluid collection.  No definite CT evidence for acute cortical based (large artery) infarction. The visualized paranasal sinuses and mastoid air cells are predominately clear.  No displaced calvarial fracture.  Cervical spine:  C1 bone island.  No acute fracture or dislocation. Maintained craniocervical relationship.  Mild multilevel degenerative changes with anterior osteophyte formation at C5-6. No central canal narrowing.  Lung apices are clear.  No prevertebral soft tissue swelling.  IMPRESSION: White matter hypodensities, a nonspecific finding most often seen with chronic microangiopathic change.  No definite acute intracranial abnormality.  No acute fracture or dislocation of the cervical spine.  Original Report Authenticated By: Waneta Martins, M.D.   Ct Cervical Spine Wo Contrast  04/29/2011  *RADIOLOGY REPORT*  Clinical Data: Status post fall, head pain.  CT HEAD WITHOUT CONTRAST,CT CERVICAL SPINE WITHOUT CONTRAST  Technique:  Contiguous axial images were obtained from the base of the skull through the vertex without contrast.,Technique: Multidetector CT imaging of the cervical spine was performed. Multiplanar CT image reconstructions were also generated.  Comparison: None.  Findings:  Head: Prominence of the sulci, cisterns, and ventricles, in keeping with volume loss. There are subcortical and  periventricular white matter hypodensities, a nonspecific finding most often seen with chronic microangiopathic changes.  There is no evidence for acute hemorrhage, overt hydrocephalus, mass lesion, or abnormal extra-axial fluid collection.  No definite CT evidence for acute cortical based (large artery) infarction. The visualized paranasal sinuses and mastoid air cells are predominately clear.  No displaced calvarial fracture.  Cervical spine:  C1 bone island.  No acute fracture or dislocation. Maintained craniocervical relationship.  Mild multilevel degenerative changes with anterior osteophyte formation at C5-6. No central canal narrowing.  Lung apices are clear.  No prevertebral soft tissue swelling.  IMPRESSION: White matter hypodensities, a nonspecific finding most often seen with chronic microangiopathic change.  No definite acute intracranial abnormality.  No acute fracture or dislocation of the cervical spine.  Original Report Authenticated By: Waneta Martins, M.D.    Review of Systems  Unable to perform ROS: dementia   Blood pressure 112/67, pulse 58, temperature 97.1 F (36.2 C), temperature source Oral, resp. rate 18, height 5\' 7"  (1.702 m), weight 68.4 kg (150 lb 12.7 oz), SpO2 90.00%. Physical Exam  Constitutional: She appears well-developed  and well-nourished. No distress.  HENT:  Head: Normocephalic and atraumatic.  Eyes: Pupils are equal, round, and reactive to light.  Neck: Normal range of motion. No JVD present.  Cardiovascular: Normal rate.   Respiratory: Effort normal.  GI: Soft.  Neurological: She is alert.  Skin: Skin is warm and dry.  Psychiatric: She has a normal mood and affect. Her behavior is normal.       Thought content and judgment abnormal   CDV: pulses are normal   Skin: normal  Lymph: nodes were not palpable/normal  Neuro: normal sensation  Upper extremity exam  Inspection and palpation revealed no abnormalities in the upper extremities.  Range of  motion is full without contracture.  Motor exam is normal with grade 5 strength.  The joints are fully reduced without subluxation.  There is no atrophy or tremor and muscle tone is normal.  All joints are stable.   LEFT Lower extremity exam  Ambulation CAN NOT AMBULATE  Inspection and palpation revealed no tenderness or abnormality in alignment in the lower extremities. Range of motion is full.  Strength is grade 5.  And all joints are stable.  Right lower extremity externally rotated slightly short. Tenderness around the greater trochanter and any attempts at range of motion. Muscle tone is normal.   Assessment/Plan: X-ray shows right femoral neck fracture with complete displacement.  I will speak with the power of attorney name is Mrs.[email or web address]  phone number is [phone] or [phone].  SURGERY. RT BIPOLAR, DEPUY TUES FEB 2013  TYPE AND CROSS FOR 2 U HG IS 12  ADD POTASSIUM   Fuller Canada 04/30/2011, 11:16 AM

## 2011-04-30 NOTE — ED Notes (Signed)
Patient lying on stretcher.  Patient awaiting admission for right hip fracture.

## 2011-04-30 NOTE — Progress Notes (Signed)
CSW initiated SNF bed search as pt will need rehab following surgery scheduled for tomorrow.  Full note in shadow chart.  Awaiting return call from pt's guardian, Chasity with [facility].  CSW to continue to follow.   [name]

## 2011-04-30 NOTE — Plan of Care (Signed)
Problem: Consults Goal: Hip/Femur Fracture Patient Education See Patient Education Module for education specifics. Outcome: Progressing Patient being educated on need for surgery to repair fracture Goal: Skin Care Protocol Initiated - if indicated If consults are not indicated, leave blank or document N/A Outcome: Progressing Turn Q2, barrier creme, heels floated  Problem: Phase I Progression Outcomes Goal: Pre op pain controlled with appropriate interventions Outcome: Progressing Dilaudid iv therapy for pain management Goal: Pre op Medical MD consult, if indicated Outcome: Progressing Order for consult  Problem: Phase II Progression Outcomes Goal: Discharge plan established Outcome: Progressing Originally from [facility]

## 2011-04-30 NOTE — Progress Notes (Signed)
NAMELEXINGTON, DEVINE               ACCOUNT NO.:  [id number]  MEDICAL RECORD NO.:  [id number]  LOCATION:  A301                          FACILITY:  [facility]  PHYSICIAN:  Destyne Goodreau D. Felecia Shelling, MD   DATE OF BIRTH:  03/02/1946  DATE OF PROCEDURE:  04/30/2011 DATE OF DISCHARGE:                                PROGRESS NOTE   SUBJECTIVE:  The patient complains of pain on her hip where she fell and broke her hip.  No fever or chills.  OBJECTIVE:  GENERAL:  The patient is alert, awake, and chronically sick looking. VITAL SIGNS:  Blood pressure 99/60, pulse 55, respiratory rate 18, temperature 97 degrees Fahrenheit. CHEST:  Decreased air entry, few rhonchi. CARDIOVASCULAR SYSTEM:  First and second heart sounds heard.  No murmur. No gallop. ABDOMEN:  Soft and lax.  Bowel sound is positive.  No mass or organomegaly. EXTREMITIES:  The patient has tenderness and swelling of right hip.  No leg edema.  ASSESSMENT: 1. Right hip fracture. 2. History of anxiety and depression disorder. 3. History of urinary incontinence. 4. History of chronic diarrhea. 5. Irritable bowel syndrome.  PLAN:  We will continue the patient on pain management.  We will do Orthopedic consult.  Continue regular treatment.     Eilidh Marcano D. Felecia Shelling, MD     TDF/MEDQ  D:  04/30/2011  T:  04/30/2011  Job:  [id number]

## 2011-05-01 ENCOUNTER — Inpatient Hospital Stay (HOSPITAL_COMMUNITY): Payer: Medicare Other | Admitting: Anesthesiology

## 2011-05-01 ENCOUNTER — Encounter (HOSPITAL_COMMUNITY): Payer: Self-pay | Admitting: *Deleted

## 2011-05-01 ENCOUNTER — Encounter (HOSPITAL_COMMUNITY): Payer: Self-pay | Admitting: Anesthesiology

## 2011-05-01 ENCOUNTER — Inpatient Hospital Stay (HOSPITAL_COMMUNITY): Payer: Medicare Other

## 2011-05-01 ENCOUNTER — Encounter (HOSPITAL_COMMUNITY)
Admission: EM | Disposition: A | Discharge status: Skilled Nursing Facility | Payer: Self-pay | Source: Home / Self Care | Attending: Internal Medicine

## 2011-05-01 HISTORY — PX: HIP ARTHROPLASTY: SHX981

## 2011-05-01 LAB — BASIC METABOLIC PANEL
Calcium: 9.5 mg/dL (ref 8.4–10.5)
GFR calc Af Amer: >90 mL/min (ref >90)
GFR calc non Af Amer: 88 mL/min — ABNORMAL LOW (ref >90)
Glucose, Bld: 113 mg/dL — ABNORMAL HIGH (ref 70–99)
Potassium: 4.8 mEq/L (ref 3.5–5.1)
Sodium: 141 mEq/L (ref 135–145)

## 2011-05-01 SURGERY — HEMIARTHROPLASTY, HIP, DIRECT ANTERIOR APPROACH, FOR FRACTURE
Anesthesia: Spinal | Site: Hip | Laterality: Right | Wound class: Clean

## 2011-05-01 MED ORDER — FENTANYL CITRATE 0.05 MG/ML IJ SOLN
INTRAMUSCULAR | Status: DC | PRN
  Administered 2011-05-01: 50 ug via INTRAVENOUS
  Administered 2011-05-01: 20 ug via INTRAVENOUS
  Administered 2011-05-01: 20 ug via INTRATHECAL

## 2011-05-01 MED ORDER — MENTHOL 3 MG MT LOZG: 1.0000 | LOZENGE | OROMUCOSAL | Status: DC | PRN

## 2011-05-01 MED ORDER — ONDANSETRON HCL 4 MG/2ML IJ SOLN
4.0000 mg | Freq: Once | INTRAMUSCULAR | Status: AC | PRN
  Administered 2011-05-01: 4 mg via INTRAVENOUS

## 2011-05-01 MED ORDER — MIDAZOLAM HCL 2 MG/2ML IJ SOLN
1.0000 mg | INTRAMUSCULAR | Status: DC | PRN
  Administered 2011-05-01: 2 mg via INTRAVENOUS

## 2011-05-01 MED ORDER — SIMVASTATIN 20 MG PO TABS
20.0000 mg | ORAL_TABLET | Freq: Every evening | ORAL | Status: DC
  Administered 2011-05-01 – 2011-05-03 (×3): 20 mg via ORAL
  Filled 2011-05-01 (×3): qty 1

## 2011-05-01 MED ORDER — SODIUM CHLORIDE 0.9 % IR SOLN
Status: DC | PRN
  Administered 2011-05-01: 1000 mL

## 2011-05-01 MED ORDER — PHENOL 1.4 % MT LIQD: 1.0000 | OROMUCOSAL | Status: DC | PRN

## 2011-05-01 MED ORDER — DICYCLOMINE HCL 10 MG PO CAPS
10.0000 mg | ORAL_CAPSULE | Freq: Three times a day (TID) | ORAL | Status: DC
  Administered 2011-05-02 – 2011-05-04 (×8): 10 mg via ORAL
  Filled 2011-05-01 (×8): qty 1

## 2011-05-01 MED ORDER — ACETAMINOPHEN 650 MG RE SUPP: 650.0000 mg | Freq: Four times a day (QID) | RECTAL | Status: DC | PRN

## 2011-05-01 MED ORDER — FLUOXETINE HCL 20 MG PO CAPS
60.0000 mg | ORAL_CAPSULE | Freq: Every day | ORAL | Status: DC
  Administered 2011-05-01 – 2011-05-03 (×3): 60 mg via ORAL
  Filled 2011-05-01 (×3): qty 3

## 2011-05-01 MED ORDER — BISACODYL 5 MG PO TBEC
5.0000 mg | DELAYED_RELEASE_TABLET | Freq: Every day | ORAL | Status: DC | PRN
Start: 2011-05-01 — End: 2011-05-04

## 2011-05-01 MED ORDER — DOCUSATE SODIUM 100 MG PO CAPS
100.0000 mg | ORAL_CAPSULE | Freq: Two times a day (BID) | ORAL | Status: DC
  Administered 2011-05-01 – 2011-05-04 (×6): 100 mg via ORAL
  Filled 2011-05-01 (×6): qty 1

## 2011-05-01 MED ORDER — LACTATED RINGERS IV SOLN
INTRAVENOUS | Status: DC
  Administered 2011-05-01: 1000 mL via INTRAVENOUS
  Administered 2011-05-01: 15:00:00 via INTRAVENOUS

## 2011-05-01 MED ORDER — FESOTERODINE FUMARATE ER 4 MG PO TB24
4.0000 mg | ORAL_TABLET | Freq: Every day | ORAL | Status: DC
  Administered 2011-05-01 – 2011-05-04 (×4): 4 mg via ORAL
  Filled 2011-05-01 (×7): qty 1

## 2011-05-01 MED ORDER — SODIUM CHLORIDE 0.9 % IV SOLN
INTRAVENOUS | Status: DC
  Administered 2011-05-01 – 2011-05-03 (×4): via INTRAVENOUS

## 2011-05-01 MED ORDER — FENTANYL CITRATE 0.05 MG/ML IJ SOLN: 25.0000 ug | INTRAMUSCULAR | Status: DC | PRN

## 2011-05-01 MED ORDER — PROPOFOL 10 MG/ML IV BOLUS
INTRAVENOUS | Status: DC | PRN
  Administered 2011-05-01 (×4): 20 mg via INTRAVENOUS

## 2011-05-01 MED ORDER — ONDANSETRON HCL 4 MG/2ML IJ SOLN
INTRAMUSCULAR | Status: AC
  Administered 2011-05-01: 4 mg via INTRAVENOUS
  Filled 2011-05-01: qty 2

## 2011-05-01 MED ORDER — PROPOFOL 10 MG/ML IV EMUL
INTRAVENOUS | Status: DC | PRN
  Administered 2011-05-01: 25 ug/kg/min via INTRAVENOUS

## 2011-05-01 MED ORDER — ONDANSETRON HCL 4 MG/2ML IJ SOLN
4.0000 mg | Freq: Once | INTRAMUSCULAR | Status: AC
  Administered 2011-05-01: 4 mg via INTRAVENOUS

## 2011-05-01 MED ORDER — BUPIVACAINE IN DEXTROSE 0.75-8.25 % IT SOLN
INTRATHECAL | Status: DC | PRN
  Administered 2011-05-01: 15 mg via INTRATHECAL

## 2011-05-01 MED ORDER — CEFAZOLIN SODIUM 1-5 GM-% IV SOLN
INTRAVENOUS | Status: AC
  Filled 2011-05-01: qty 100

## 2011-05-01 MED ORDER — SODIUM CHLORIDE 0.9 % IV SOLN
INTRAVENOUS | Status: AC
  Administered 2011-05-01: 09:00:00 via INTRAVENOUS

## 2011-05-01 MED ORDER — PROPOFOL 10 MG/ML IV EMUL
INTRAVENOUS | Status: AC
  Filled 2011-05-01: qty 20

## 2011-05-01 MED ORDER — FENTANYL CITRATE 0.05 MG/ML IJ SOLN
INTRAMUSCULAR | Status: AC
  Filled 2011-05-01: qty 2

## 2011-05-01 MED ORDER — MIDAZOLAM HCL 2 MG/2ML IJ SOLN
INTRAMUSCULAR | Status: AC
  Filled 2011-05-01: qty 2

## 2011-05-01 MED ORDER — CEFAZOLIN SODIUM 1-5 GM-% IV SOLN
INTRAVENOUS | Status: DC | PRN
  Administered 2011-05-01: 1 g via INTRAVENOUS

## 2011-05-01 MED ORDER — MORPHINE SULFATE 2 MG/ML IJ SOLN
2.0000 mg | INTRAMUSCULAR | Status: DC | PRN
  Administered 2011-05-01 – 2011-05-02 (×6): 2 mg via INTRAVENOUS
  Filled 2011-05-01 (×6): qty 1

## 2011-05-01 MED ORDER — SENNA 8.6 MG PO TABS
1.0000 | ORAL_TABLET | Freq: Two times a day (BID) | ORAL | Status: DC
  Administered 2011-05-01 – 2011-05-04 (×6): 8.6 mg via ORAL
  Filled 2011-05-01 (×6): qty 1

## 2011-05-01 MED ORDER — CEFAZOLIN SODIUM 1-5 GM-% IV SOLN
1.0000 g | Freq: Four times a day (QID) | INTRAVENOUS | Status: AC
  Administered 2011-05-01 – 2011-05-02 (×3): 1 g via INTRAVENOUS
  Filled 2011-05-01 (×3): qty 50

## 2011-05-01 MED ORDER — ONDANSETRON HCL 4 MG PO TABS: 4.0000 mg | ORAL_TABLET | Freq: Four times a day (QID) | ORAL | Status: DC | PRN

## 2011-05-01 MED ORDER — METHOCARBAMOL 100 MG/ML IJ SOLN
500.0000 mg | Freq: Four times a day (QID) | INTRAVENOUS | Status: DC | PRN
  Filled 2011-05-01: qty 5

## 2011-05-01 MED ORDER — PANTOPRAZOLE SODIUM 40 MG PO TBEC
40.0000 mg | DELAYED_RELEASE_TABLET | Freq: Every day | ORAL | Status: DC
  Administered 2011-05-01 – 2011-05-04 (×4): 40 mg via ORAL
  Filled 2011-05-01 (×4): qty 1

## 2011-05-01 MED ORDER — BUPROPION HCL ER (XL) 300 MG PO TB24
450.0000 mg | ORAL_TABLET | Freq: Every day | ORAL | Status: DC
  Administered 2011-05-01 – 2011-05-04 (×4): 450 mg via ORAL
  Filled 2011-05-01 (×8): qty 1

## 2011-05-01 MED ORDER — SENNOSIDES-DOCUSATE SODIUM 8.6-50 MG PO TABS: 1.0000 | ORAL_TABLET | Freq: Every evening | ORAL | Status: DC | PRN

## 2011-05-01 MED ORDER — ONDANSETRON HCL 4 MG/2ML IJ SOLN: 4.0000 mg | Freq: Four times a day (QID) | INTRAMUSCULAR | Status: DC | PRN

## 2011-05-01 MED ORDER — MIDAZOLAM HCL 5 MG/5ML IJ SOLN
INTRAMUSCULAR | Status: DC | PRN
  Administered 2011-05-01: 2 mg via INTRAVENOUS

## 2011-05-01 MED ORDER — METOCLOPRAMIDE HCL 10 MG PO TABS: 5.0000 mg | ORAL_TABLET | Freq: Three times a day (TID) | ORAL | Status: DC | PRN

## 2011-05-01 MED ORDER — FLEET ENEMA 7-19 GM/118ML RE ENEM
1.0000 | ENEMA | Freq: Once | RECTAL | Status: AC | PRN
Start: 2011-05-01 — End: 2011-05-01

## 2011-05-01 MED ORDER — SODIUM CHLORIDE 0.9 % IJ SOLN
INTRAMUSCULAR | Status: AC
  Filled 2011-05-01: qty 3

## 2011-05-01 MED ORDER — BUPIVACAINE IN DEXTROSE 0.75-8.25 % IT SOLN
INTRATHECAL | Status: AC
  Filled 2011-05-01: qty 2

## 2011-05-01 MED ORDER — ALUM & MAG HYDROXIDE-SIMETH 200-200-20 MG/5ML PO SUSP: 30.0000 mL | ORAL | Status: DC | PRN

## 2011-05-01 MED ORDER — METOCLOPRAMIDE HCL 5 MG/ML IJ SOLN: 5.0000 mg | Freq: Three times a day (TID) | INTRAMUSCULAR | Status: DC | PRN

## 2011-05-01 MED ORDER — BUPIVACAINE HCL (PF) 0.5 % IJ SOLN
INTRAMUSCULAR | Status: DC | PRN
  Administered 2011-05-01: 60 mL

## 2011-05-01 MED ORDER — OLANZAPINE 5 MG PO TABS
10.0000 mg | ORAL_TABLET | Freq: Every day | ORAL | Status: DC
  Administered 2011-05-01 – 2011-05-03 (×3): 10 mg via ORAL
  Filled 2011-05-01 (×3): qty 2

## 2011-05-01 MED ORDER — CEFAZOLIN SODIUM 1-5 GM-% IV SOLN
INTRAVENOUS | Status: AC
  Filled 2011-05-01: qty 50

## 2011-05-01 MED ORDER — BUPIVACAINE-EPINEPHRINE PF 0.5-1:200000 % IJ SOLN
INTRAMUSCULAR | Status: AC
  Filled 2011-05-01: qty 20

## 2011-05-01 MED ORDER — ACETAMINOPHEN 325 MG PO TABS
650.0000 mg | ORAL_TABLET | Freq: Four times a day (QID) | ORAL | Status: DC | PRN
  Administered 2011-05-02 – 2011-05-03 (×2): 650 mg via ORAL
  Filled 2011-05-01 (×2): qty 2

## 2011-05-01 MED ORDER — MIDAZOLAM HCL 2 MG/2ML IJ SOLN
INTRAMUSCULAR | Status: AC
  Administered 2011-05-01: 2 mg via INTRAVENOUS
  Filled 2011-05-01: qty 2

## 2011-05-01 MED ORDER — METHOCARBAMOL 500 MG PO TABS
500.0000 mg | ORAL_TABLET | Freq: Four times a day (QID) | ORAL | Status: DC | PRN
  Administered 2011-05-02 (×2): 500 mg via ORAL
  Filled 2011-05-01 (×2): qty 1

## 2011-05-01 MED ORDER — ACETAMINOPHEN 10 MG/ML IV SOLN
1000.0000 mg | Freq: Four times a day (QID) | INTRAVENOUS | Status: DC
  Administered 2011-05-01: 1000 mg via INTRAVENOUS

## 2011-05-01 MED ORDER — ACETAMINOPHEN 10 MG/ML IV SOLN
INTRAVENOUS | Status: AC
  Administered 2011-05-01: 1000 mg via INTRAVENOUS
  Filled 2011-05-01: qty 100

## 2011-05-01 SURGICAL SUPPLY — 53 items
BAG HAMPER (MISCELLANEOUS) ×2 IMPLANT
BIT DRILL 2.8X128 (BIT) ×2 IMPLANT
BLADE HEX COATED 2.75 (ELECTRODE) ×2 IMPLANT
BLADE SAGITTAL 25.0X1.27X90 (BLADE) ×2 IMPLANT
BRUSH FEMORAL CANAL (MISCELLANEOUS) IMPLANT
CHLORAPREP W/TINT 26ML (MISCELLANEOUS) ×2 IMPLANT
CLOTH BEACON ORANGE TIMEOUT ST (SAFETY) ×2 IMPLANT
COVER LIGHT HANDLE STERIS (MISCELLANEOUS) ×4 IMPLANT
COVER PROBE W GEL 5X96 (DRAPES) ×2 IMPLANT
DECANTER SPIKE VIAL GLASS SM (MISCELLANEOUS) ×4 IMPLANT
DRAIN TROCAR  MED 1/8 (DRAIN) IMPLANT
DRAPE HIP W/POCKET STRL (DRAPE) ×2 IMPLANT
DRAPE U-SHAPE 47X51 STRL (DRAPES) ×2 IMPLANT
DRSG MEPILEX BORDER 4X12 (GAUZE/BANDAGES/DRESSINGS) ×1 IMPLANT
ELECT REM PT RETURN 9FT ADLT (ELECTROSURGICAL) ×2
ELECTRODE REM PT RTRN 9FT ADLT (ELECTROSURGICAL) ×1 IMPLANT
FACESHIELD LNG OPTICON STERILE (SAFETY) ×2 IMPLANT
GLOVE ECLIPSE 6.5 STRL STRAW (GLOVE) ×3 IMPLANT
GLOVE ECLIPSE 7.0 STRL STRAW (GLOVE) ×1 IMPLANT
GLOVE EXAM NITRILE MD LF STRL (GLOVE) ×1 IMPLANT
GLOVE INDICATOR 7.0 STRL GRN (GLOVE) ×1 IMPLANT
GLOVE INDICATOR 7.5 STRL GRN (GLOVE) ×1 IMPLANT
GLOVE SKINSENSE NS SZ8.0 LF (GLOVE) ×2
GLOVE SKINSENSE STRL SZ8.0 LF (GLOVE) ×2 IMPLANT
GLOVE SS N UNI LF 8.5 STRL (GLOVE) ×2 IMPLANT
GOWN STRL REIN XL XLG (GOWN DISPOSABLE) ×7 IMPLANT
INST SET MAJOR BONE (KITS) ×2 IMPLANT
KIT BLADEGUARD II DBL (SET/KITS/TRAYS/PACK) ×2 IMPLANT
KIT ROOM TURNOVER APOR (KITS) ×2 IMPLANT
MANIFOLD NEPTUNE II (INSTRUMENTS) ×2 IMPLANT
MARKER SKIN DUAL TIP RULER LAB (MISCELLANEOUS) ×3 IMPLANT
NDL HYPO 21X1.5 SAFETY (NEEDLE) ×1 IMPLANT
NEEDLE HYPO 21X1.5 SAFETY (NEEDLE) ×2 IMPLANT
NS IRRIG 1000ML POUR BTL (IV SOLUTION) ×3 IMPLANT
PACK TOTAL JOINT (CUSTOM PROCEDURE TRAY) ×2 IMPLANT
PAD ARMBOARD 7.5X6 YLW CONV (MISCELLANEOUS) ×2 IMPLANT
PILLOW HIP ABDUCTION LRG (ORTHOPEDIC SUPPLIES) IMPLANT
PILLOW HIP ABDUCTION MED (ORTHOPEDIC SUPPLIES) ×1 IMPLANT
PIN STMN 9X.142 IN (PIN) ×2 IMPLANT
SET BASIN LINEN APH (SET/KITS/TRAYS/PACK) ×2 IMPLANT
STAPLER VISISTAT 35W (STAPLE) ×2 IMPLANT
SUT BRALON NAB BRD #1 30IN (SUTURE) ×2 IMPLANT
SUT ETHIBOND 5 LR DA (SUTURE) ×4 IMPLANT
SUT MON AB 0 CT1 (SUTURE) ×2 IMPLANT
SUT MON AB 2-0 CT1 36 (SUTURE) ×2 IMPLANT
SUT VIC AB 1 CT1 27 (SUTURE) ×4
SUT VIC AB 1 CT1 27XBRD ANTBC (SUTURE) ×2 IMPLANT
SYR 30ML LL (SYRINGE) ×2 IMPLANT
SYR BULB IRRIGATION 50ML (SYRINGE) ×2 IMPLANT
TOWER CARTRIDGE SMART MIX (DISPOSABLE) IMPLANT
TRAY FOLEY CATH 14FR (SET/KITS/TRAYS/PACK) IMPLANT
WATER STERILE IRR 1000ML POUR (IV SOLUTION) ×4 IMPLANT
YANKAUER SUCT 12FT TUBE ARGYLE (SUCTIONS) ×2 IMPLANT

## 2011-05-01 NOTE — Plan of Care (Signed)
Problem: Phase I Progression Outcomes Goal: Pre op-initial discharge plan identified Outcome: Completed/Met Date Met:  05/01/11 Pt will go to snf

## 2011-05-01 NOTE — Progress Notes (Signed)
CSW spoke with pt's guardian Chasity regarding d/c plan.  She accepts bed at [facility] for pt.  [facility] notified and were also made aware of 30 day pasarr.  [facility] to arrange appointment to complete admission paperwork.  Pt scheduled for surgery today. CSW to continue to follow.  [name]

## 2011-05-01 NOTE — H&P (View-Only) (Signed)
Patient ID: [name], female   DOB: 03/26/1945, 65 y.o.   MRN: [id number] Labs  CBC    Component Value Date/Time   WBC 7.3 04/30/2011 0450   RBC 3.90 04/30/2011 0450   HGB 12.0 04/30/2011 0450   HCT 35.1* 04/30/2011 0450   PLT 235 04/30/2011 0450   MCV 90.0 04/30/2011 0450   MCH 30.8 04/30/2011 0450   MCHC 34.2 04/30/2011 0450   RDW 13.5 04/30/2011 0450   LYMPHSABS 1.0 04/30/2011 0002   MONOABS 0.4 04/30/2011 0002   EOSABS 0.1 04/30/2011 0002   BASOSABS 0.0 04/30/2011 0002     BMET    Component Value Date/Time   NA 141 05/01/2011 0452   K 4.8 05/01/2011 0452   CL 109 05/01/2011 0452   CO2 26 05/01/2011 0452   GLUCOSE 113* 05/01/2011 0452   BUN 5* 05/01/2011 0452   CREATININE 0.72 05/01/2011 0452   CALCIUM 9.5 05/01/2011 0452   GFRNONAA 88* 05/01/2011 0452   GFRAA >90 05/01/2011 0452    ekg and blood completed   

## 2011-05-01 NOTE — Transfer of Care (Signed)
Immediate Anesthesia Transfer of Care Note  Patient: [name]  Procedure(s) Performed:  ARTHROPLASTY BIPOLAR HIP  Patient Location: PACU  Anesthesia Type: General  Level of Consciousness: awake, alert  and oriented  Airway & Oxygen Therapy: Patient Spontanous Breathing and Patient connected to nasal cannula oxygen  Post-op Assessment: Report given to PACU RN  Post vital signs: Reviewed and stable  Complications: No apparent anesthesia complications

## 2011-05-01 NOTE — Interval H&P Note (Signed)
History and Physical Interval Note:  05/01/2011 1:06 PM  [name]  has presented today for surgery, with the diagnosis of RIGHT HIP FRACTURE - FEMORAL NECK DISPLACED  The various methods of treatment have been discussed with the patient and family. After consideration of risks, benefits and other options for treatment, the patient has consented to  Procedure(s): ARTHROPLASTY BIPOLAR RIGHT  HIP as a surgical intervention .  The patients' history has been reviewed, patient examined, no change in status, stable for surgery.  I have reviewed the patients' chart and labs.  Questions were answered to the patient's satisfaction.     Fuller Canada

## 2011-05-01 NOTE — Progress Notes (Signed)
Awake. Asking for judy. Sensation level at L1. Unable to move lower extremities.

## 2011-05-01 NOTE — Anesthesia Preprocedure Evaluation (Signed)
Anesthesia Evaluation  Patient identified by MRN, date of birth, ID band  Airway Mallampati: II      Dental  (+) Edentulous Upper and Edentulous Lower   Pulmonary  clear to auscultation        Cardiovascular neg cardio ROS Regular Normal    Neuro/Psych Depression    GI/Hepatic GERD-  Controlled and Medicated,  Endo/Other    Renal/GU      Musculoskeletal   Abdominal   Peds  Hematology   Anesthesia Other Findings   Reproductive/Obstetrics                           Anesthesia Physical Anesthesia Plan  ASA: III  Anesthesia Plan: Spinal   Post-op Pain Management:    Induction:   Airway Management Planned: Nasal Cannula  Additional Equipment:   Intra-op Plan:   Post-operative Plan:   Informed Consent: I have reviewed the patients History and Physical, chart, labs and discussed the procedure including the risks, benefits and alternatives for the proposed anesthesia with the patient or authorized representative who has indicated his/her understanding and acceptance.     Plan Discussed with:   Anesthesia Plan Comments:         Anesthesia Quick Evaluation

## 2011-05-01 NOTE — Progress Notes (Signed)
[name], [name]               ACCOUNT NO.:  [id number]  MEDICAL RECORD NO.:  [id number]  LOCATION:  A301                          FACILITY:  [facility]  PHYSICIAN:  Lucah Petta D. Felecia Shelling, MD   DATE OF BIRTH:  05/05/1945  DATE OF PROCEDURE:  05/01/2011 DATE OF DISCHARGE:                                PROGRESS NOTE   SUBJECTIVE:  Complains of right hip pain.  She is planned for surgery today.  OBJECTIVE:  GENERAL:  The patient is alert, awake and chronically sick looking. VITALS:  Blood pressure 115/64, pulse 70, respiratory rate 18 and temperature 99 degrees Fahrenheit. CHEST:  Decreased air entry, few rhonchi. CARDIOVASCULAR SYSTEM:  First and second heart sounds heard.  No murmur. No gallop.  ABDOMEN:  Soft and lax.  Bowel sound is positive.  No mass or organomegaly. EXTREMITIES:  No leg edema.  LABORATORY DATA:  Sodium 141, potassium 4.8, chloride 101, carbon dioxide 26, glucose 113, BUN 5, creatinine 0.72, calcium 9.5.  ASSESSMENT: 1. Right hip fracture secondary to fall. 2. History of anxiety depression disorder. 3. Irritable bowel syndrome.  PLAN:  The patient is planned for surgery today.  Continue pain management and current treatment.     Nahome Bublitz D. Felecia Shelling, MD     TDF/MEDQ  D:  05/01/2011  T:  05/01/2011  Job:  [id number]

## 2011-05-01 NOTE — Addendum Note (Signed)
Addendum  created 05/01/11 1528 by Glynn Octave, CRNA   Modules edited:Anesthesia Medication Administration

## 2011-05-01 NOTE — Anesthesia Procedure Notes (Signed)
Spinal  Patient location during procedure: OR Start time: 05/01/2011 1:36 PM Staffing CRNA/Resident: Glynn Octave Preanesthetic Checklist Completed: patient identified, site marked, surgical consent, pre-op evaluation, timeout performed, IV checked, risks and benefits discussed and monitors and equipment checked Spinal Block Patient position: right lateral decubitus Prep: Betadine Patient monitoring: heart rate, cardiac monitor, continuous pulse ox and blood pressure Approach: right paramedian Location: L3-4 Injection technique: single-shot Needle Needle type: Spinocan  Needle gauge: 22 G Needle length: 9 cm Assessment Sensory level: T8 Additional Notes Tray # [id number] 01/2012

## 2011-05-01 NOTE — Progress Notes (Signed)
Patient ID: [name], female   DOB: Dec 19, 1945, 66 y.o.   MRN: [id number] Labs  CBC    Component Value Date/Time   WBC 7.3 04/30/2011 0450   RBC 3.90 04/30/2011 0450   HGB 12.0 04/30/2011 0450   HCT 35.1* 04/30/2011 0450   PLT 235 04/30/2011 0450   MCV 90.0 04/30/2011 0450   MCH 30.8 04/30/2011 0450   MCHC 34.2 04/30/2011 0450   RDW 13.5 04/30/2011 0450   LYMPHSABS 1.0 04/30/2011 0002   MONOABS 0.4 04/30/2011 0002   EOSABS 0.1 04/30/2011 0002   BASOSABS 0.0 04/30/2011 0002     BMET    Component Value Date/Time   NA 141 05/01/2011 0452   K 4.8 05/01/2011 0452   CL 109 05/01/2011 0452   CO2 26 05/01/2011 0452   GLUCOSE 113* 05/01/2011 0452   BUN 5* 05/01/2011 0452   CREATININE 0.72 05/01/2011 0452   CALCIUM 9.5 05/01/2011 0452   GFRNONAA 88* 05/01/2011 0452   GFRAA >90 05/01/2011 0452    ekg and blood completed

## 2011-05-01 NOTE — Progress Notes (Signed)
Awake. Wants to see [name]. Denies pain. Sensation level L1. Unable to move lower extremities. Abduction pillow in place. Rt pedal pulse palpable. Rt toes pink in color/warm to touch. Report called to ciecely rn. Informed her to verify that the tylenol IV order crossed over to there orders. Voiced understanding.

## 2011-05-01 NOTE — Brief Op Note (Signed)
04/29/2011 - 05/01/2011  3:22 PM  PATIENT:  [name]  66 y.o. female  PRE-OPERATIVE DIAGNOSIS:  RIGHT HIP FRACTURE - FEMORAL NECK DISPLACED  POST-OPERATIVE DIAGNOSIS:  RIGHT HIP FRACTURE - FEMORAL NECK DISPLACED  PROCEDURE:  Procedure(s): ARTHROPLASTY BIPOLAR HIP  SURGEON:  Surgeon(s): Fuller Canada, MD  PHYSICIAN ASSISTANT:   ASSISTANTS: Chloey ASHLEY   ANESTHESIA:   spinal  EBL:  Total I/O In: 1000 [I.V.:1000] Out: 405 [Urine:345; Blood:60]  BLOOD ADMINISTERED:none  DRAINS: none   LOCAL MEDICATIONS USED:  MARCAINE WITH EPI 60-CC  SPECIMEN:  No Specimen  DISPOSITION OF SPECIMEN:  N/A  COUNTS:  YES  TOURNIQUET:  * No tourniquets in log *  DICTATION: .Dragon Dictation  PLAN OF CARE: Admit to inpatient   PATIENT DISPOSITION:  PACU - hemodynamically stable.   Delay start of Pharmacological VTE agent (>24hrs) due to surgical blood loss or risk of bleeding:  HOLD X 24 HRS

## 2011-05-01 NOTE — Addendum Note (Signed)
Addendum  created 05/01/11 1529 by Glynn Octave, CRNA   Modules edited:Anesthesia Medication Administration

## 2011-05-01 NOTE — Op Note (Signed)
04/29/2011 - 05/01/2011  3:22 PM  PATIENT:  [name]  66 y.o. female  PRE-OPERATIVE DIAGNOSIS:  RIGHT HIP FRACTURE - FEMORAL NECK DISPLACED  POST-OPERATIVE DIAGNOSIS:  RIGHT HIP FRACTURE - FEMORAL NECK DISPLACED  PROCEDURE:  Procedure(s): ARTHROPLASTY BIPOLAR HIP  SURGEON:  Surgeon(s): Fuller Canada, MD  PHYSICIAN ASSISTANT:   ASSISTANTS: Dewana ASHLEY   ANESTHESIA:   spinal  EBL:  Total I/O In: 1000 [I.V.:1000] Out: 405 [Urine:345; Blood:60]  BLOOD ADMINISTERED:none  DRAINS: none   LOCAL MEDICATIONS USED:  MARCAINE WITH EPI 60-CC  SPECIMEN:  No Specimen  DISPOSITION OF SPECIMEN:  N/A  COUNTS:  YES  TOURNIQUET:  * No tourniquets in log *  DICTATION: .Dragon Dictation  PLAN OF CARE: Admit to inpatient   PATIENT DISPOSITION:  PACU - hemodynamically stable.   Delay start of Pharmacological VTE agent (>24hrs) due to surgical blood loss or risk of bleeding:  HOLD X 24 HRS   DETAILS OF PROCEDURE:  The patient was identified in the preop holding area and the surgical site was marked and countersigned by the surgeon, the chart was updated. The consent was signed.  The patient was taken to the operating room for spinal anesthetic and then placed in the lateral decubitus position with appropriate padding and axillary roll. 1g of ANCEF were given because of the patient's weight of < 80 KG   After sterile prep and drape the timeout was executed  A lateral incision was made centered over the RIGHT greater trochanter to perform a direct lateral approach to the hip. After dividing the subcutaneous tissue down to the fascia, the fascia was split in line with the skin incision. Electrocautery was used to obtain hemostasis.   After deep retractors were placed, the gluteus medius was defined and the anterior half was peeled from the group trochanter in continuity with the vastus lateralis; the anterior branch to the femoral circumflex artery was cauterized. Subperiosteal  dissection continued until the gluteus minimus along with the gluteus medius was retracted proximally.  2 Steinmann pins were placed in the pelvis to retract the Glutei. The hip was dislocated anteriorly, a provisional femoral neck cut was made using the cutting guide. The lesser trochanter was then identified with further soft tissue dissection and a second femoral neck cut was made using the same guide.   A box osteotome was used to lateralize entry point into the femoral canal, this was followed by a femoral canal finder and subsequent broaching up to a size 6 femur. THE HEAD WAS MEASURED TO SIZE 47   Trial reductions were then performed starting with SIZE 6 F  47 H 1.5 NECK LENGTH. Leg length and stability were restored with THESE IMPLANTS . We confirmed knee flexion past 90. ROM: 5 hyperextension with 50 of external rotation. We had adequate shuck test. Sleeping position was stable. At 90 flexion the hip internally rotated 50 without dislocation.  The trial components were then removed, drill holes were placed in the trochanter and  #5 sutures were passed through the drill holes; the stem was placed followed by the femoral head.  The hip was reduced. The ROM tests were repeated and were satisfactory.The # 5 sutures were used to repair the abductors with the leg slightly internally rotated.  The wound was irrigated and 30 cc of Marcaine with epinephrine was injected into the sub-gluteus medius area. Fascia was closed with the leg abducted with #1 Bralon sutures; followed by subfascial injection of 30 cc of Marcaine with epinephrine.  Subcutaneous tissue was closed with 0 Monocryl 2-0 MONOCRYL  Skin staples were used to reapproximate the skin edges and a sterile dressing was applied. The  pain pump catheter was activated.  The patient was taken to the recovery room in stable condition.  Standard postop total hip arthroplasty protocol for direct lateral approach.

## 2011-05-01 NOTE — Progress Notes (Signed)
From OR. Awake. Sensation level at T10. Unable to move lower extremities. Abduction pillow in place without straps. Denies pain.

## 2011-05-01 NOTE — Progress Notes (Signed)
Awake. C/O nausea. No emesis. Med as noted. 

## 2011-05-01 NOTE — Anesthesia Postprocedure Evaluation (Signed)
  Anesthesia Post-op Note  Patient: [name]  Procedure(s) Performed:  ARTHROPLASTY BIPOLAR HIP  Patient Location: PACU  Anesthesia Type: General  Level of Consciousness: awake, alert  and oriented  Airway and Oxygen Therapy: Patient Spontanous Breathing  Post-op Pain: none  Post-op Assessment: Post-op Vital signs reviewed, Patient's Cardiovascular Status Stable, Respiratory Function Stable and No signs of Nausea or vomiting  Post-op Vital Signs: Reviewed and stable  Complications: No apparent anesthesia complications

## 2011-05-02 LAB — CBC
Platelets: 203 10*3/uL (ref 150–400)
RBC: 3.54 MIL/uL — ABNORMAL LOW (ref 3.87–5.11)
WBC: 6.4 10*3/uL (ref 4.0–10.5)

## 2011-05-02 LAB — BASIC METABOLIC PANEL
CO2: 28 mEq/L (ref 19–32)
Calcium: 9.4 mg/dL (ref 8.4–10.5)
Chloride: 104 mEq/L (ref 96–112)
Sodium: 138 mEq/L (ref 135–145)

## 2011-05-02 MED ORDER — BIOTENE DRY MOUTH MT LIQD
15.0000 mL | Freq: Two times a day (BID) | OROMUCOSAL | Status: DC
  Administered 2011-05-02 – 2011-05-04 (×5): 15 mL via OROMUCOSAL

## 2011-05-02 MED ORDER — HYDROCODONE-ACETAMINOPHEN 5-325 MG PO TABS
1.0000 | ORAL_TABLET | ORAL | Status: AC
  Administered 2011-05-02 – 2011-05-03 (×6): 1 via ORAL
  Filled 2011-05-02 (×6): qty 1

## 2011-05-02 MED ORDER — ALPRAZOLAM 1 MG PO TABS
1.0000 mg | ORAL_TABLET | Freq: Four times a day (QID) | ORAL | Status: DC | PRN
  Administered 2011-05-02 – 2011-05-03 (×3): 1 mg via ORAL
  Filled 2011-05-02 (×3): qty 1

## 2011-05-02 NOTE — Progress Notes (Signed)
05/02/11 1109 Patient had foley catheter d/c'd this morning at 0615 per report. Voided independently at 1100, 200 ml clear, yellow urine. Nursing to monitor.

## 2011-05-02 NOTE — Anesthesia Postprocedure Evaluation (Signed)
  Anesthesia Post-op Note  Patient: [name]  Procedure(s) Performed:  ARTHROPLASTY BIPOLAR HIP  Patient Location: Room 301  Anesthesia Type: Spinal  Level of Consciousness: awake, alert , oriented and patient cooperative  Airway and Oxygen Therapy: Patient Spontanous Breathing and Patient connected to nasal cannula oxygen  Post-op Pain: moderate  Post-op Assessment: Post-op Vital signs reviewed, Patient's Cardiovascular Status Stable, Respiratory Function Stable, Patent Airway and Adequate PO intake  Post-op Vital Signs: Reviewed and stable  Complications: No apparent anesthesia complications

## 2011-05-02 NOTE — Progress Notes (Addendum)
Subjective: 1 Day Post-Op Procedure(s) (LRB): ARTHROPLASTY BIPOLAR HIP (Right) Patient reports pain as ??? 10 level, says her back hurts .    Objective: Vital signs in last 24 hours: Temp:  [97.4 F (36.3 C)-98.4 F (36.9 C)] 98.4 F (36.9 C) (02/06 0544) Pulse Rate:  [55-77] 77  (02/06 0544) Resp:  [11-28] 18  (02/06 0747) BP: (88-138)/(52-75) 104/64 mmHg (02/06 0544) SpO2:  [95 %-100 %] 95 % (02/06 0544)  Intake/Output from previous day: 02/05 0701 - 02/06 0700 In: 4982 [I.V.:4982] Out: 1295 [Urine:1195; Blood:100] Intake/Output this shift:     Basename 05/02/11 0433 04/30/11 0450 04/30/11 0002  HGB 10.7* 12.0 12.5    Basename 05/02/11 0433 04/30/11 0450  WBC 6.4 7.3  RBC 3.54* 3.90  HCT 32.6* 35.1*  PLT 203 235    Basename 05/02/11 0433 05/01/11 0452  NA 138 141  K 4.0 4.8  CL 104 109  CO2 28 26  BUN 4* 5*  CREATININE 0.65 0.72  GLUCOSE 146* 113*  CALCIUM 9.4 9.5    Basename 04/30/11 0002  LABPT --  INR 1.00    Neurologically intact Sensation intact distally Intact pulses distally Dorsiflexion/Plantar flexion intact  Assessment/Plan: 1 Day Post-Op Procedure(s) (LRB): ARTHROPLASTY BIPOLAR HIP (Right) Up with therapy Plan for discharge tomorrow to [facility]  Stop IV tylenol continue morphine and start hydrocodone for pain   Fuller Canada 05/02/2011, 8:01 AM

## 2011-05-02 NOTE — Addendum Note (Signed)
Addendum  created 05/02/11 0755 by Corena Pilgrim, CRNA   Modules edited:Notes Section

## 2011-05-02 NOTE — Evaluation (Signed)
Physical Therapy Evaluation Patient Details Name: [name] MRN: [id number] DOB: [birth year] Today's Date: 05/02/2011  Problem List:  Patient Active Problem List  Diagnoses  . CLOSED FRACTURE OF HEAD OF RADIUS  . SUBLUXATION-RADIAL HEAD  . Fall  . Hip fracture, right    Past Medical History:  Past Medical History  Diagnosis Date  . IBS (irritable bowel syndrome)   . Depression   . GERD (gastroesophageal reflux disease)   . Gout    Past Surgical History: History reviewed. No pertinent past surgical history.  PT Assessment/Plan/Recommendation PT Assessment Clinical Impression Statement: attempted to do PT eval...pt very agitated upon my arrival, c/o severe pain...pt medicated by RN for pain during my visit.Marland KitchenMarland KitchenI attempted to gently explain the reason for my visit, what would happen and the importance of starting to mobilize, but pt simply screams with any attempt at movement (of the bed, taking the abductor pillow away, trying to move the uninvolved extremity)...it became pointless to pursue mobilization any further...will try again later on today PT Recommendation/Assessment: Patient will need skilled PT in the acute care venue PT Problem List: Decreased strength;Decreased range of motion;Decreased activity tolerance;Decreased mobility;Decreased cognition;Decreased knowledge of precautions;Decreased knowledge of use of DME;Decreased safety awareness;Pain PT Therapy Diagnosis : Difficulty walking;Generalized weakness;Acute pain PT Plan PT Frequency: Min 5X/week PT Treatment/Interventions: DME instruction;Gait training;Therapeutic activities;Functional mobility training;Therapeutic exercise;Patient/family education PT Recommendation Follow Up Recommendations: Skilled nursing facility PT Goals  Acute Rehab PT Goals PT Goal Formulation: Patient unable to participate in goal setting Time For Goal Achievement: 2 weeks Pt will go Supine/Side to Sit: with max assist PT Goal:  Supine/Side to Sit - Progress: Goal set today Pt will go Sit to Supine/Side: with max assist PT Goal: Sit to Supine/Side - Progress: Goal set today Pt will go Sit to Stand: with max assist PT Goal: Sit to Stand - Progress: Goal set today Pt will go Stand to Sit: with max assist PT Goal: Stand to Sit - Progress: Goal set today Pt will Transfer Bed to Chair/Chair to Bed: with max assist PT Transfer Goal: Bed to Chair/Chair to Bed - Progress: Goal set today  PT Evaluation Precautions/Restrictions  Precautions Precautions: Anterior Hip Precaution Booklet Issued: No Required Braces or Orthoses: No Restrictions Weight Bearing Restrictions: No RLE Weight Bearing: Non weight bearing Other Position/Activity Restrictions: Pt is WBAT R per MD order Prior Functioning  Home Living Receives Help From: Personal care attendant Type of Home: Assisted living Home Layout: One level Home Access: Level entry Home Adaptive Equipment: Walker - rolling Prior Function Level of Independence: Independent with basic ADLs;Independent with gait;Requires assistive device for independence;Independent with transfers Driving: No Cognition Cognition Arousal/Alertness: Awake/alert Overall Cognitive Status: History of cognitive impairments History of Cognitive Impairment: Appears at baseline functioning Orientation Level: Oriented to person Cognition - Other Comments: pt appears somewhat agitated when spoken to, perhaps a bit paranoid in nature...she is unable to give any specific hx Sensation/Coordination Sensation Light Touch: Appears Intact Stereognosis: Not tested Hot/Cold: Not tested Proprioception: Not tested Extremity Assessment   Mobility (including Balance)      Exercise    End of Session PT - End of Session Patient left: in bed Nurse Communication:  (unwillingness of pt to cooperate) General Behavior During Session: Agitated Cognition: Impaired, at baseline  Myrlene Broker L 05/02/2011,  9:01 AM

## 2011-05-02 NOTE — Progress Notes (Signed)
05/02/11 1845 Patient becomes more confused and anxious towards evening, reoriented as needed and provided support. Takes scheduled xanax at [place] per PTA med list. Notified Dr Juanetta Gosling (on call for [facility]), orders received to resume xanax for anxiety. Nursing to monitor.

## 2011-05-02 NOTE — Progress Notes (Signed)
NAMENORENE, OLIVERI               ACCOUNT NO.:  [id number]  MEDICAL RECORD NO.:  [id number]  LOCATION:  A301                          FACILITY:  [facility]  PHYSICIAN:  Timberlyn Pickford D. Felecia Shelling, MD   DATE OF BIRTH:  Sep 23, 1945  DATE OF PROCEDURE:  05/02/2011 DATE OF DISCHARGE:                                PROGRESS NOTE   SUBJECTIVE:  The patient complains of pain on her right hip where she had surgery.  No fever or chills.  OBJECTIVE:  GENERAL:  The patient is alert, awake, sick looking. VITAL SIGNS:  Blood pressure 138/75, pulse 77, respiratory rate 28, temperature 98 degrees Fahrenheit. CHEST:  Decreased air entry, few rhonchi. CARDIOVASCULAR:  First and second heart sounds heard.  No murmur.  No gallop. ABDOMEN:  Soft and lax.  Bowel sound is positive.  No mass or organomegaly. EXTREMITIES: No leg edema.  LABORATORY DATA:  CBC:  WBC 6.4, hemoglobin 10.7, hematocrit 32.8, and platelets 213.  BMP:  Sodium 138, potassium 4.0, chloride 104, carbon dioxide 28, glucose 146, BUN 4, creatinine 0.65.  ASSESSMENT: 1. Right hip fracture and status post right hip arthroplasty. 2. Anxiety and depression disorder. 3. History of irritable bowel syndrome.  PLAN:  Continue the patient on pain medications.  Continue current treatment according to Surgery recommendation.     Sadhana Frater D. Felecia Shelling, MD     TDF/MEDQ  D:  05/02/2011  T:  05/02/2011  Job:  [id number]

## 2011-05-03 LAB — CBC
MCV: 91.4 fL (ref 78.0–100.0)
Platelets: 190 10*3/uL (ref 150–400)
RBC: 3.25 MIL/uL — ABNORMAL LOW (ref 3.87–5.11)
WBC: 5.1 10*3/uL (ref 4.0–10.5)

## 2011-05-03 LAB — BASIC METABOLIC PANEL
CO2: 28 mEq/L (ref 19–32)
Chloride: 106 mEq/L (ref 96–112)
Creatinine, Ser: 0.67 mg/dL (ref 0.50–1.10)
Potassium: 3.2 mEq/L — ABNORMAL LOW (ref 3.5–5.1)

## 2011-05-03 MED ORDER — SODIUM CHLORIDE 0.9 % IJ SOLN
INTRAMUSCULAR | Status: AC
  Administered 2011-05-03: 3 mL
  Filled 2011-05-03: qty 3

## 2011-05-03 MED ORDER — POTASSIUM CHLORIDE CRYS ER 20 MEQ PO TBCR
20.0000 meq | EXTENDED_RELEASE_TABLET | Freq: Three times a day (TID) | ORAL | Status: DC
  Administered 2011-05-03 – 2011-05-04 (×4): 20 meq via ORAL
  Filled 2011-05-03 (×4): qty 1

## 2011-05-03 MED ORDER — ACETAMINOPHEN 325 MG PO TABS: 650.0000 mg | ORAL_TABLET | Freq: Once | ORAL | Status: AC

## 2011-05-03 MED ORDER — SODIUM CHLORIDE 0.9 % IJ SOLN
INTRAMUSCULAR | Status: AC
  Filled 2011-05-03: qty 3

## 2011-05-03 MED ORDER — HYDROCODONE-ACETAMINOPHEN 5-325 MG PO TABS
1.0000 | ORAL_TABLET | ORAL | Status: DC | PRN
  Administered 2011-05-03 – 2011-05-04 (×3): 1 via ORAL
  Filled 2011-05-03 (×3): qty 1

## 2011-05-03 NOTE — Clinical Documentation Improvement (Signed)
Anemia Blood Loss Clarification  THIS DOCUMENT IS NOT A PERMANENT PART OF THE MEDICAL RECORD  RESPOND TO THE THIS QUERY, FOLLOW THE INSTRUCTIONS BELOW:  1. If needed, update documentation for the patient's encounter via the notes activity.  2. Access this query again and click edit on the In [employer].  3. After updating, or not, click F2 to complete all highlighted (required) fields concerning your review. Select "additional documentation in the medical record" OR "no additional documentation provided".  4. Click Sign note button.  5. The deficiency will fall out of your In Basket *Please let us know if you are not able to complete this workflow by phone or e-mail (listed below).        05/03/11  Dear Dr. Romeo Apple Marton Redwood  In an effort to better capture your patient's severity of illness, reflect appropriate length of stay and utilization of resources, a review of the patient medical record has revealed the following indicators.    Based on your clinical judgment, please clarify and document in a progress note and/or discharge summary the clinical condition associated with the following supporting information:  In responding to this query please exercise your independent judgment.  The fact that a query is asked, does not imply that any particular answer is desired or expected.  Possible Clinical Conditions?   " Expected Acute Blood Loss Anemia " Acute Blood Loss Anemia " Acute on chronic blood loss anemia " Chronic blood loss anemia " Precipitous drop in Hematocrit " Other Condition________________ " Cannot Clinically Determine  Clinical Information:   Risk Factors: s/p Arthroplasty bipolar hip EBL 60ml "Postoperative anemia"  Diagnostics: H&H  2/4=12/35.1 2/6=10.7/32.6 2/7=9.8/29.7  Treatments: Transfusion: 2 units PRBC  Reviewed: additional documentation in the medical record  Thank You,  Debora T Williams RN, MSN Clinical Documentation  Specialist: Office# [phone] [facility] Information Management [place]    DONE

## 2011-05-03 NOTE — Progress Notes (Signed)
Subjective: 2 Days Post-Op Procedure(s) (LRB): ARTHROPLASTY BIPOLAR HIP (Right) Patient reports pain as moderate.    Objective: Vital signs in last 24 hours: Temp:  [98.2 F (36.8 C)-98.9 F (37.2 C)] 98.6 F (37 C) (02/07 0616) Pulse Rate:  [75-101] 101  (02/07 0616) Resp:  [16-20] 20  (02/07 0737) BP: (101-111)/(57-72) 111/57 mmHg (02/07 0616) SpO2:  [90 %-96 %] 90 % (02/07 0616)  Intake/Output from previous day: 02/06 0701 - 02/07 0700 In: 2566.7 [P.O.:300; I.V.:2266.7] Out: 700 [Urine:700] Intake/Output this shift:     Basename 05/03/11 0423 05/02/11 0433  HGB 9.8* 10.7*    Basename 05/03/11 0423 05/02/11 0433  WBC 5.1 6.4  RBC 3.25* 3.54*  HCT 29.7* 32.6*  PLT 190 203    Basename 05/03/11 0423 05/02/11 0433  NA 140 138  K 3.2* 4.0  CL 106 104  CO2 28 28  BUN 7 4*  CREATININE 0.67 0.65  GLUCOSE 110* 146*  CALCIUM 8.9 9.4   No results found for this basename: LABPT:2,INR:2 in the last 72 hours  Neurologically intact Neurovascular intact Sensation intact distally Intact pulses distally Dorsiflexion/Plantar flexion intact  Assessment/Plan: 2 Days Post-Op Procedure(s) (LRB): ARTHROPLASTY BIPOLAR HIP (Right) Up with therapy, transfuse 2 units, oral potassium  D/c snf tomorrow   Fuller Canada 05/03/2011, 7:41 AM

## 2011-05-03 NOTE — Progress Notes (Signed)
[name], [name]               ACCOUNT NO.:  [id number]  MEDICAL RECORD NO.:  [id number]  LOCATION:  A301                          FACILITY:  [facility]  PHYSICIAN:  Davanta Meuser D. Felecia Shelling, MD   DATE OF BIRTH:  02-21-1946  DATE OF PROCEDURE:  05/03/2011 DATE OF DISCHARGE:                                PROGRESS NOTE   SUBJECTIVE:  The patient continued to complain of right hip pain.  She claims she is unable to ambulate.  OBJECTIVE:  GENERAL:  The patient is alert, awake, and chronically sick looking. VITAL SIGNS:  Blood pressure 101/65, pulse 75, respiratory rate 16, temperature 98.2 degrees Fahrenheit. CHEST:  Clear lung fields.  Good air entry. CARDIOVASCULAR SYSTEM:  First and second heart sounds heard.  No murmur. No gallop. ABDOMEN:  Soft and lax.  Bowel sound is positive.  No mass or organomegaly. EXTREMITIES:  The patient has staples in her right hip where she had surgery.  LABORATORY DATA:  CBC:  WBC 5.1, hemoglobin 9.8, hematocrit 29.7.  BMP: Sodium 140, potassium 3.2, chloride 106, carbon dioxide 26, glucose 110, BUN 7, creatinine 0.7.  ASSESSMENT: 1. History of right hip fracture and status post arthroplasty. 2. Postoperative anemia. 3. Mild hypokalemia. 4. Anxiety/depression disorder. 5. Irritable bowel syndrome.  PLAN:  The patient is planned for a transfusion of 2 units of packed red blood cells by Dr. Romeo Apple.  We will also supplement her potassium.  We will continue to monitor her CBC, BMP.  Continue physical therapy.     [name] D. Felecia Shelling, MD     TDF/MEDQ  D:  05/03/2011  T:  05/03/2011  Job:  [id number]

## 2011-05-03 NOTE — Progress Notes (Signed)
05/03/11 1210 patient had temp of 100.2 at 1145 for pre-blood transfusion vitals. Called Dr Romeo Apple to notify, office had him call back at 1205. Stated to give tylenol 650 mg po prior to blood transfusion and begin blood as ordered. Pt had tylenol ordered already as PRN dose. Given tylenol 650 mg po at 1206 prior to blood transfusion. Blood started at 1210. Nursing to monitor. Attempted to notify patient's guardian, chasity clapp of orders for blood transfusion this morning, unable to reach. Blood consent signed per guardian on 05/01/11.

## 2011-05-03 NOTE — Plan of Care (Signed)
Problem: Phase II Progression Outcomes Goal: Bed to chair Outcome: Completed/Met Date Met:  05/03/11 Up with physical therapy to chair this morning and stand/pivot back to bed.

## 2011-05-03 NOTE — Progress Notes (Signed)
Physical Therapy Treatment Patient Details Name: [name] MRN: [id number] DOB: 06-Mar-1946 Today's Date: 05/03/2011  PT Assessment/Plan  PT - Assessment/Plan Comments on Treatment Session: continues to be very cooperative within a narrow window of acitivity...mod assist to transfer chair to bed...will continue to progress slowly PT Plan: Discharge plan remains appropriate;Frequency remains appropriate Recommendations for Other Services: OT consult Equipment Recommended: Defer to next venue PT Goals  Acute Rehab PT Goals Pt will go Supine/Side to Sit: with mod assist;with HOB not 0 degrees (comment degree) (HOB at 30 deg) PT Goal: Supine/Side to Sit - Progress: Updated due to goal met PT Goal: Sit to Supine/Side - Progress: Progressing toward goal Pt will go Sit to Stand: with min assist PT Goal: Sit to Stand - Progress: Updated due to goal met Pt will go Stand to Sit: with min assist PT Goal: Stand to Sit - Progress: Progressing toward goal Pt will Transfer Bed to Chair/Chair to Bed: with min assist PT Transfer Goal: Bed to Chair/Chair to Bed - Progress: Updated due to goal met  PT Treatment Precautions/Restrictions  Precautions Precautions: Anterior Hip Precaution Booklet Issued: No Required Braces or Orthoses: No Restrictions Weight Bearing Restrictions: Yes RLE Weight Bearing: Non weight bearing Other Position/Activity Restrictions: Pt is WBAT R per MD order Mobility (including Balance) Bed Mobility Bed Mobility: Yes Supine to Sit: 2: Max assist Supine to Sit Details (indicate cue type and reason): HOB at 60 deg Sitting - Scoot to Edge of Bed: 2: Max assist Sitting - Scoot to [place] of Bed Details (indicate cue type and reason): pt unable to assist...strongly resist sitting on R hip Sit to Supine: 1: +2 Total assist Transfers Transfers: Yes Sit to Stand: 4: Min assist Stand to Sit: 3: Mod assist Stand Pivot Transfers: 3: Mod assist Stand Pivot Transfer Details  (indicate cue type and reason): pt able to fully support her weight on LLE Ambulation/Gait Ambulation/Gait: No Stairs: No Wheelchair Mobility Wheelchair Mobility: No  Posture/Postural Control Posture/Postural Control: No significant limitations Balance Balance Assessed: No Exercise    End of Session PT - End of Session Equipment Utilized During Treatment: Gait belt Activity Tolerance: Patient tolerated treatment well Patient left: in bed;with call bell in reach;with bed alarm set Nurse Communication: Mobility status for transfers General Behavior During Session: [facility] for tasks performed Cognition: Impaired, at baseline  Myrlene Broker L 05/03/2011, 10:44 AM

## 2011-05-03 NOTE — Progress Notes (Signed)
Physical Therapy Treatment Patient Details Name: KRISHAWNA STIEFEL MRN: [id number] DOB: Sep 22, 1945 Today's Date: 05/03/2011  PT Assessment/Plan  PT - Assessment/Plan Recommendations for Other Services: OT consult Equipment Recommended: Defer to next venue PT Goals  Acute Rehab PT Goals Pt will go Supine/Side to Sit: with mod assist;with HOB not 0 degrees (comment degree) (HOB at 30 deg) PT Goal: Supine/Side to Sit - Progress: Updated due to goal met Pt will go Sit to Stand: with mod assist PT Goal: Sit to Stand - Progress: Updated due to goal met Pt will go Stand to Sit: with min assist PT Goal: Stand to Sit - Progress: Updated due to goals met Pt will Transfer Bed to Chair/Chair to Bed: with mod assist PT Transfer Goal: Bed to Chair/Chair to Bed - Progress: Updated due to goal met  PT Treatment Precautions/Restrictions  Precautions Precautions: Anterior Hip Precaution Booklet Issued: No Required Braces or Orthoses: No Restrictions Weight Bearing Restrictions: Yes RLE Weight Bearing: Non weight bearing Other Position/Activity Restrictions: Pt is WBAT R per MD order Mobility (including Balance) Bed Mobility Bed Mobility: Yes Supine to Sit: 2: Max assist Supine to Sit Details (indicate cue type and reason): HOB at 60 deg Sitting - Scoot to Edge of Bed: 2: Max assist Sitting - Scoot to [place] of Bed Details (indicate cue type and reason): pt unable to assist...strongly resist sitting on R hip Transfers Transfers: Yes Sit to Stand: 2: Max assist Stand to Sit: 3: Mod assist Stand Pivot Transfers: 2: Max assist Ambulation/Gait Ambulation/Gait: No Stairs: No Wheelchair Mobility Wheelchair Mobility: No  Posture/Postural Control Posture/Postural Control: No significant limitations Balance Balance Assessed: No Exercise    End of Session PT - End of Session Equipment Utilized During Treatment: Gait belt Activity Tolerance: Patient tolerated treatment well Patient left: in  chair;with call bell in reach;with bed alarm set Nurse Communication: Mobility status for transfers General Behavior During Session: [facility] for tasks performed Cognition: Impaired, at baseline  Myrlene Broker L 05/03/2011, 9:46 AM

## 2011-05-03 NOTE — Evaluation (Signed)
Occupational Therapy Evaluation Patient Details Name: ADREONNA YONTZ MRN: [id number] DOB: [birth year] Today's Date: 05/03/2011  Problem List:  Patient Active Problem List  Diagnoses  . CLOSED FRACTURE OF HEAD OF RADIUS  . SUBLUXATION-RADIAL HEAD  . Fall  . Hip fracture, right    Past Medical History:  Past Medical History  Diagnosis Date  . IBS (irritable bowel syndrome)   . Depression   . GERD (gastroesophageal reflux disease)   . Gout    Past Surgical History: History reviewed. No pertinent past surgical history.  OT Assessment/Plan/Recommendation OT Assessment Clinical Impression Statement: A: Patient able to follow basic directions and has episodes of confusion. Patient fearful or and has difficulty with movement due to pain , anxiety and decreased motor planning.Patient would benefit from OT to work on Maintaining BUE AROM and increase strength, improve mobility and activity tolerance ansd improve transfers to and from the commode. OT Recommendation/Assessment: Patient will need skilled OT in the acute care venue OT Problem List: Decreased strength;Decreased range of motion;Decreased activity tolerance;Impaired balance (sitting and/or standing);Decreased coordination;Decreased cognition;Decreased safety awareness;Decreased knowledge of use of DME or AE;Decreased knowledge of precautions;Impaired UE functional use OT Therapy Diagnosis : Generalized weakness;Cognitive deficits;Acute pain;Altered mental status;Apraxia OT Plan OT Frequency: Min 3X/week OT Treatment/Interventions: Self-care/ADL training;Therapeutic exercise;Therapeutic activities;DME and/or AE instruction;Cognitive remediation/compensation;Patient/family education OT Recommendation Follow Up Recommendations: Skilled nursing facility OT Goals Acute Rehab OT Goals OT Goal Formulation: With patient ADL Goals Pt Will Perform Grooming: with set-up;Sitting, edge of bed;with cueing (comment type and amount);with  supervision ADL Goal: Grooming - Progress: Goal set today Arm Goals Pt Will Perform AROM: with supervision, verbal cues required/provided;with minimal assist;to maintain range of motion Arm Goal: AROM - Progress: Goal set today  OT Evaluation Precautions/Restrictions  Precautions Precautions: Anterior Hip Precaution Booklet Issued: No Required Braces or Orthoses: No Restrictions Weight Bearing Restrictions: Yes RLE Weight Bearing: Non weight bearing Other Position/Activity Restrictions: Pt is WBAT R per MD order Prior Functioning Home Living Lives With: Other (Comment) ([facility]) Receives Help From: Personal care attendant Type of Home: Assisted living Home Layout: One level Home Access: Level entry Home Adaptive Equipment: Walker - rolling Prior Function Level of Independence: Needs assistance with ADLs Bath: Other (comment) (Unable to assertain level of assist required. 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 States meals brought to her room. Sensation/Coordination Sensation Light Touch: Appears Intact Stereognosis: Not tested Hot/Cold: Not tested Proprioception: Not tested Coordination Gross Motor Movements are Fluid and Coordinated: No Fine Motor Movements are Fluid and Coordinated: No Coordination and Movement Description: Labored coordination and problems motor planning when attempting to sit edge of bed and to scoot to head of bed when sitting EOB. Extremity Assessment RUE Assessment RUE Assessment:  Exceptions to [facility] RUE Strength RUE Overall Strength:  (4/5 grossly) LUE Assessment LUE Assessment: Exceptions to [facility] LUE Strength LUE Overall Strength Comments: 4/5 decreasded strength grossly. Patient had a difficult time following directions for manual muscle testing. Mobility  Bed Mobility Bed Mobility: Yes Supine to Sit: 3: Mod assist Sitting - Scoot to Edge of Bed: 3: Mod assist Sitting - Scoot to [place] of  Bed Details (indicate cue type and reason): Patient stated "I want to try to do it myself" But had difficulty with motor planning the task and hesitant to sit on right hip once sitting EOB. Sit to Supine: 1: +1 Total assist Sit to Supine - Details (indicate cue type and reason): OT helped at legs and upper trunk due to patient rigidity. Transfers Transfers: No Sit to Stand: 4: Min assist Stand to Sit: 3: Mod assist Exercises General Exercises - Upper Extremity Shoulder Flexion: AAROM;Supine Shoulder Extension: AAROM;Supine Shoulder ABduction: AAROM;Supine Shoulder ADduction: AAROM;Supine Elbow Flexion: AAROM;Supine Elbow Extension: AAROM;Supine End of Session OT - End of Session Activity Tolerance: Patient tolerated treatment well Patient left: in bed;with call bell in reach;with bed alarm set General Behavior During Session: [facility] for tasks performed Cognition: [facility] for tasks performed   Lisa Roca OTR/L 05/03/2011, 1:49 PM

## 2011-05-04 ENCOUNTER — Inpatient Hospital Stay
Admission: RE | Admit: 2011-05-04 | Discharge: 2011-05-04 | Disposition: A | Discharge status: Other Acute Inpatient Hospital | Payer: Medicaid Other | Source: Ambulatory Visit | Attending: Internal Medicine | Admitting: Internal Medicine

## 2011-05-04 ENCOUNTER — Encounter (HOSPITAL_COMMUNITY): Payer: Self-pay | Admitting: Orthopedic Surgery

## 2011-05-04 ENCOUNTER — Emergency Department (HOSPITAL_COMMUNITY): Payer: Medicare Other

## 2011-05-04 ENCOUNTER — Inpatient Hospital Stay
Admission: RE | Admit: 2011-05-04 | Discharge: 2011-05-22 | Disposition: A | Discharge status: Nursing Home (Includes ICF) | Payer: Medicaid Other | Source: Ambulatory Visit | Attending: Internal Medicine | Admitting: Internal Medicine

## 2011-05-04 ENCOUNTER — Emergency Department (HOSPITAL_COMMUNITY)
Admission: EM | Admit: 2011-05-04 | Discharge: 2011-05-04 | Disposition: A | Discharge status: Home / Self Care | Payer: Medicare Other | Attending: Emergency Medicine | Admitting: Emergency Medicine

## 2011-05-04 DIAGNOSIS — K589 Irritable bowel syndrome without diarrhea: Secondary | ICD-10-CM | POA: Insufficient documentation

## 2011-05-04 DIAGNOSIS — F329 Major depressive disorder, single episode, unspecified: Secondary | ICD-10-CM | POA: Insufficient documentation

## 2011-05-04 DIAGNOSIS — F3289 Other specified depressive episodes: Secondary | ICD-10-CM | POA: Insufficient documentation

## 2011-05-04 DIAGNOSIS — Z79899 Other long term (current) drug therapy: Secondary | ICD-10-CM | POA: Insufficient documentation

## 2011-05-04 DIAGNOSIS — W19XXXA Unspecified fall, initial encounter: Secondary | ICD-10-CM

## 2011-05-04 DIAGNOSIS — Y9210 Unspecified residential institution as the place of occurrence of the external cause: Secondary | ICD-10-CM | POA: Insufficient documentation

## 2011-05-04 DIAGNOSIS — K219 Gastro-esophageal reflux disease without esophagitis: Secondary | ICD-10-CM | POA: Insufficient documentation

## 2011-05-04 DIAGNOSIS — W06XXXA Fall from bed, initial encounter: Secondary | ICD-10-CM | POA: Insufficient documentation

## 2011-05-04 DIAGNOSIS — M25559 Pain in unspecified hip: Secondary | ICD-10-CM | POA: Insufficient documentation

## 2011-05-04 LAB — BASIC METABOLIC PANEL
BUN: 7 mg/dL (ref 6–23)
CO2: 24 mEq/L (ref 19–32)
Chloride: 108 mEq/L (ref 96–112)
Creatinine, Ser: 0.56 mg/dL (ref 0.50–1.10)
Glucose, Bld: 131 mg/dL — ABNORMAL HIGH (ref 70–99)

## 2011-05-04 LAB — TYPE AND SCREEN
Antibody Screen: NEGATIVE
Unit division: 0

## 2011-05-04 LAB — CBC
HCT: 35.6 % — ABNORMAL LOW (ref 36.0–46.0)
Hemoglobin: 12.1 g/dL (ref 12.0–15.0)
MCV: 89 fL (ref 78.0–100.0)
RBC: 4 MIL/uL (ref 3.87–5.11)
RDW: 14.5 % (ref 11.5–15.5)
WBC: 5.6 10*3/uL (ref 4.0–10.5)

## 2011-05-04 MED ORDER — ENOXAPARIN SODIUM 30 MG/0.3ML ~~LOC~~ SOLN: 30.0000 mg | SUBCUTANEOUS | Status: DC

## 2011-05-04 MED ORDER — ALPRAZOLAM 1 MG PO TABS: 1.0000 mg | ORAL_TABLET | Freq: Four times a day (QID) | ORAL | Status: DC

## 2011-05-04 MED ORDER — DICYCLOMINE HCL 10 MG PO CAPS: 10.0000 mg | ORAL_CAPSULE | Freq: Three times a day (TID) | ORAL | Status: DC

## 2011-05-04 MED ORDER — HYDROCODONE-ACETAMINOPHEN 5-325 MG PO TABS: 1.0000 | ORAL_TABLET | ORAL | Status: DC | PRN

## 2011-05-04 MED ORDER — FESOTERODINE FUMARATE ER 4 MG PO TB24: 4.0000 mg | ORAL_TABLET | Freq: Every day | ORAL | Status: DC

## 2011-05-04 MED ORDER — OLANZAPINE 10 MG PO TABS: 10.0000 mg | ORAL_TABLET | Freq: Every day | ORAL | Status: DC

## 2011-05-04 MED ORDER — BISACODYL 5 MG PO TBEC: 5.0000 mg | DELAYED_RELEASE_TABLET | Freq: Every day | ORAL | Status: DC | PRN

## 2011-05-04 MED ORDER — POTASSIUM CHLORIDE CRYS ER 20 MEQ PO TBCR: 20.0000 meq | EXTENDED_RELEASE_TABLET | Freq: Every day | ORAL | Status: DC

## 2011-05-04 NOTE — ED Notes (Signed)
Patient ambulated with assistance x 2 to wheelchair to be transferred back to [facility]. Bed alarm cleaned and returned to cabinet.

## 2011-05-04 NOTE — ED Provider Notes (Signed)
This chart was scribed for [employer]. Colon Branch, MD by Williemae Natter. The patient was seen in room APA04/APA04 at 6:55 PM.  CSN: [id number]  Arrival date & time 05/04/11  7829   First MD Initiated Contact with Patient 05/04/11 1846      Chief Complaint  Patient presents with  . Fall    (Consider location/radiation/quality/duration/timing/severity/associated sxs/prior treatment) Patient is a 66 y.o. female presenting with fall.  Fall The accident occurred 1 to 2 hours ago. The fall occurred from a bed. She landed on a hard floor. There was no blood loss. The point of impact was the right hip. The pain is present in the right hip.   [name] is a 66 y.o. female who presents to the Emergency Department complaining of right hip pain. Nursing home pt status post hip replacement 05/01/11. Just got discharged from [place] to snf at 2:30 this afternoon. Pt got disoriented and tried to get out of bed and fell per facility. Pt has right hip pain.  Past Medical History  Diagnosis Date  . IBS (irritable bowel syndrome)   . Depression   . GERD (gastroesophageal reflux disease)   . Gout     Past Surgical History  Procedure Date  . Hip arthroplasty 05/01/2011    Procedure: ARTHROPLASTY BIPOLAR HIP;  Surgeon: Fuller Canada, MD;  Location: [facility];  Service: Orthopedics;  Laterality: Right;    No family history on file.  History  Substance Use Topics  . Smoking status: Never Smoker   . Smokeless tobacco: Not on file  . Alcohol Use: No    OB History    Grav Para Term Preterm Abortions TAB SAB Ect Mult Living                  Review of Systems 10 Systems reviewed and are negative for acute change except as noted in the HPI.  Allergies  Diphenhydramine  Home Medications   Current Outpatient Rx  Name Route Sig Dispense Refill  . ALPRAZOLAM 1 MG PO TABS Oral Take 1 tablet (1 mg total) by mouth 4 (four) times daily. 30 tablet 0  . BISACODYL 5 MG PO TBEC Oral Take 1 tablet (5 mg  total) by mouth daily as needed. 30 tablet 0  . BUPROPION HCL ER (XL) 150 MG PO TB24 Oral Take 450 mg by mouth daily.    Marland Kitchen DICYCLOMINE HCL 10 MG PO CAPS Oral Take 1 capsule (10 mg total) by mouth 4 (four) times daily -  before meals and at bedtime. 40 capsule 0  . ENOXAPARIN SODIUM 30 MG/0.3ML [place] SOLN Subcutaneous Inject 0.3 mLs (30 mg total) into the skin daily. 8.4 mL 30  . FESOTERODINE FUMARATE ER 4 MG PO TB24 Oral Take 1 tablet (4 mg total) by mouth daily. 30 tablet 0  . FLUOXETINE HCL 20 MG PO CAPS Oral Take 60 mg by mouth daily.    Marland Kitchen HYDROCODONE-ACETAMINOPHEN 5-325 MG PO TABS Oral Take 1 tablet by mouth every 4 (four) hours as needed. 60 tablet 1  . OLANZAPINE 10 MG PO TABS Oral Take 1 tablet (10 mg total) by mouth at bedtime. 30 tablet 0  . OMEPRAZOLE 20 MG PO CPDR Oral Take 20 mg by mouth daily.    Marland Kitchen POTASSIUM CHLORIDE CRYS ER 20 MEQ PO TBCR Oral Take 1 tablet (20 mEq total) by mouth daily. 30 tablet 0  . SIMVASTATIN 20 MG PO TABS Oral Take 20 mg by mouth every evening.  BP 98/55  Pulse 84  Temp(Src) 98.8 F (37.1 C) (Oral)  Resp 24  SpO2 93%  Physical Exam  Nursing note and vitals reviewed. Constitutional: She appears well-developed and well-nourished.  HENT:  Head: Normocephalic and atraumatic.  Eyes: Conjunctivae are normal. Pupils are equal, round, and reactive to light.  Neck: Normal range of motion. Neck supple.  Cardiovascular: Normal rate, regular rhythm, normal heart sounds and normal pulses.   Pulmonary/Chest: Effort normal and breath sounds normal.  Musculoskeletal: She exhibits tenderness (right hip).       Operative site to right hip with 12 cm surgical site. Staples intact. Small amount of serosanguinous fluid on dressing. Surrounding site with mild erythema. Warm to palpation  Neurological: She is alert.  Skin: Skin is warm and dry.    ED Course  Procedures (including critical care time) DIAGNOSTIC STUDIES: Oxygen Saturation is 93% on room air,  adequate by my interpretation.    COORDINATION OF CARE: Medications - No data to display Dg Hip 1 View Right  05/04/2011  *RADIOLOGY REPORT*  Clinical Data: Fall, right hip pain  RIGHT HIP - 1 VIEW  Comparison: 05/01/2011  Findings: Single frontal view of the right hip submitted.  Again noted right hip prosthesis anatomic alignment.  Stable postsurgical changes with lateral skin staples. No acute fracture.  IMPRESSION: No acute fracture.  Stable right hip prosthesis.  Original Report Authenticated By: Natasha Mead, M.D.       MDM  Nursing home patient with recent (05/01/11) right hip replacement who fell getting out of bed. Imaging of the prothesis shows no acute injury. Patient will be returned to the nursing home. Pt stable in ED with no significant deterioration in condition.The patient appears reasonably screened and/or stabilized for discharge and I doubt any other medical condition or other [facility] requiring further screening, evaluation, or treatment in the ED at this time prior to discharge.  I personally performed the services described in this documentation, which was scribed in my presence. The recorded information has been reviewed and considered.  MDM Reviewed: nursing note and vitals Interpretation: x-ray         Nicoletta Dress. Colon Branch, MD 05/04/11 2107

## 2011-05-04 NOTE — ED Notes (Signed)
Nurse from [facility] called and reports pt was admitted to their facility today.  Pt is s/p r bipolar hip replacement surgery.  Reports pt is confused and got up out of bed and fell today.  Says pt told staff she had to go do her laundry.  Pt c/o headache, back pain, r hand pain, and r hip pain.  Staff says found pt laying on the floor.  Pt alert and staff reports is at baseline mental status.

## 2011-05-04 NOTE — Progress Notes (Signed)
[name], [name]               ACCOUNT NO.:  [id number]  MEDICAL RECORD NO.:  [id number]  LOCATION:  A301                          FACILITY:  [facility]  PHYSICIAN:  Fabricio Endsley D. Felecia Shelling, MD   DATE OF BIRTH:  [birth year]  DATE OF PROCEDURE:  05/04/2011 DATE OF DISCHARGE:                                PROGRESS NOTE   SUBJECTIVE:  The patient continued to complain of pain on her right hip area.  She is planned for transfer to nursing home and to continue physical therapy.  OBJECTIVE:  GENERAL:  The patient is alert, awake, and sick looking. VITAL SIGNS:  Blood pressure 98/66, pulse 73, respiratory rate 18, temperature 97.7 degrees Fahrenheit. CHEST:  Decreased air entry, few rhonchi. CARDIOVASCULAR SYSTEM:  First and second heart sounds heard.  No murmur. No gallop. ABDOMEN:  Soft and lax.  Bowel sound is positive.  No mass or organomegaly.  EXTREMITIES:  The patient has staples on right hip area.  LABS:  CBC:  WBC 5.6, hemoglobin 12.1, hematocrit 35.6, platelets 205. BMP:  Sodium 141, potassium 3.5, chloride 108, carbon dioxide 24, glucose 131, BUN 7, creatinine 0.6.  ASSESSMENT: 1. History of right hip fracture and status post arterioplasty. 2. Postoperative anemia, clinically improved. 3. Hypokalemia, corrected. 4. Anxiety, depression disorder.  PLAN:  We will continue the patient on current medications.  The patient is planned to be discharged by Dr. Romeo Apple to nursing home and continue to follow her in nursing home.     Sanela Evola D. Felecia Shelling, MD     TDF/MEDQ  D:  05/04/2011  T:  05/04/2011  Job:  [id number]

## 2011-05-04 NOTE — ED Notes (Signed)
Patient stated she had to use the bathroom, assisted with help from EMS students x 3 to put patient on bedpan. Patient used bedpan with no difficulty.

## 2011-05-04 NOTE — Progress Notes (Signed)
Pt d/c today by MD to [facility].  [name] Chasity notified by voicemail and [facility] is aware and agreeable.  Medicaid Prior Approval submitted and D/C summary faxed.  Pt to transfer with RN.  [name]

## 2011-05-04 NOTE — Discharge Summary (Signed)
Physician Discharge Summary  Patient ID: [name] MRN: [id number] DOB/AGE: 08/06/1945 66 y.o.  Admit date: 04/29/2011 Discharge date: 05/04/2011  Admission Diagnoses: right hip fracture   Discharge Diagnoses: right hip fracture  Principal Problem:  *Hip fracture, right Active Problems:  Fall   Discharged Condition: good  Hospital Course: The patient was admitted to the medical service Dr. Felecia Shelling underwent orthopedic consultation and surgery was recommended. The patient underwent surgery on Tuesday, February 5. Tolerated that well did have some anemia postoperatively with a hemoglobin of 9.8 after starting with a hemoglobin above 12. Check 2 units of blood and her hemoglobin went up. She had a low-grade temperature of 100.2 treated with Tylenol and resolved. She's complained of back pain since her admission which is chronic.  Consults: None  Significant Diagnostic Studies: labs:  CBC    Component Value Date/Time   WBC 5.6 05/04/2011 0511   RBC 4.00 05/04/2011 0511   HGB 12.1 05/04/2011 0511   HCT 35.6* 05/04/2011 0511   PLT 205 05/04/2011 0511   MCV 89.0 05/04/2011 0511   MCH 30.3 05/04/2011 0511   MCHC 34.0 05/04/2011 0511   RDW 14.5 05/04/2011 0511   LYMPHSABS 1.0 04/30/2011 0002   MONOABS 0.4 04/30/2011 0002   EOSABS 0.1 04/30/2011 0002   BASOSABS 0.0 04/30/2011 0002      Treatments: surgery: Bipolar hip replacement with a [place] pressfit stem.  Discharge Exam: Blood pressure 111/67, pulse 84, temperature 98.6 F (37 C), temperature source Oral, resp. rate 20, height 5\' 7"  (1.702 m), weight 71.7 kg (158 lb 1.1 oz), SpO2 96.00%. General appearance: alert, no distress and slowed mentation Incision/Wound: clean   Disposition: Home or Self Care  Discharge Orders    Future Orders Please Complete By Expires   Diet - low sodium heart healthy      Call MD / Call 911      Comments:   If you experience chest pain or shortness of breath, CALL 911 and be transported to the hospital  emergency room.  If you develope a fever above 101 F, pus (white drainage) or increased drainage or redness at the wound, or calf pain, call your surgeon's office.   Constipation Prevention      Comments:   Drink plenty of fluids.  Prune juice may be helpful.  You may use a stool softener, such as Colace (over the counter) 100 mg twice a day.  Use MiraLax (over the counter) for constipation as needed.   Increase activity slowly as tolerated      Weight Bearing as taught in Physical Therapy      Comments:   Use a walker or crutches as instructed.   Discharge instructions      Comments:   HIP FRACTURE INSTRUCTIONS: TAKE STAPLES OUT POD 12 APPLY STERI STRIPS WEIGHT BEARING AS TOLERATED WITH A WALKER X 6 WEEKS  LOVENOX X 30 DAYS  HIP PRECAUTIONS: yes anterior approach precautions   Follow the hip precautions as taught in Physical Therapy      Change dressing      Comments:   You may change your dressing on saturday, then change the dressing daily with sterile 4 x 4 inch gauze dressing and paper tape.  You may clean the incision with alcohol prior to redressing   TED hose      Comments:   Use stockings (TED hose) for 6 weeks on both leg(s).  You may remove them at night for sleeping.  Medication List  As of 05/04/2011  7:15 AM   TAKE these medications         ALPRAZolam 1 MG tablet   Commonly known as: XANAX   Take 1 tablet (1 mg total) by mouth 4 (four) times daily.      bisacodyl 5 MG EC tablet   Commonly known as: DULCOLAX   Take 1 tablet (5 mg total) by mouth daily as needed.      buPROPion 150 MG 24 hr tablet   Commonly known as: WELLBUTRIN XL   Take 450 mg by mouth daily.      dicyclomine 10 MG capsule   Commonly known as: BENTYL   Take 1 capsule (10 mg total) by mouth 4 (four) times daily -  before meals and at bedtime.      enoxaparin 30 MG/0.3ML Soln   Commonly known as: LOVENOX   Inject 0.3 mLs (30 mg total) into the skin daily.      fesoterodine 4 MG Tb24    Commonly known as: TOVIAZ   Take 1 tablet (4 mg total) by mouth daily.      FLUoxetine 20 MG capsule   Commonly known as: PROZAC   Take 60 mg by mouth daily.      HYDROcodone-acetaminophen 5-325 MG per tablet   Commonly known as: NORCO   Take 1 tablet by mouth every 4 (four) hours as needed.      OLANZapine 10 MG tablet   Commonly known as: ZYPREXA   Take 1 tablet (10 mg total) by mouth at bedtime.      omeprazole 20 MG capsule   Commonly known as: PRILOSEC   Take 20 mg by mouth daily.      potassium chloride SA 20 MEQ tablet   Commonly known as: K-DUR,KLOR-CON   Take 1 tablet (20 mEq total) by mouth daily.      simvastatin 20 MG tablet   Commonly known as: ZOCOR   Take 20 mg by mouth every evening.           Follow-up Information    Follow up with [facility], [place] .        Followup one month  Weightbearing as tolerated  Anterior hip precautions  Staples out in 12 days  Lovenox and TED hose both legs 30 days.  Signed: Fuller Canada 05/04/2011, 7:15 AM

## 2011-05-04 NOTE — Progress Notes (Signed)
Patient  Was discharged to [facility] in stable condition. Report was called.

## 2011-05-08 ENCOUNTER — Telehealth: Payer: Self-pay | Admitting: Orthopedic Surgery

## 2011-05-08 NOTE — Telephone Encounter (Signed)
Noted on appointment date of 06/05/11 and relayed to [facility] "no Xray" at this appointment; spoke with Robin.

## 2011-05-08 NOTE — Telephone Encounter (Signed)
Hospital fol/up appointment was scheduled per discharge instructions for 1 month (for 06/05/11) per call from [facility]. Spoke w/ Marchelle Folks at ph# [phone].    Will Xray be needed for 1 m hospital fol/up appointment?  Instructions noted as:  " Follow up one month, weight-bearing as tolerated, anterior hip precautions, staples out in 12 days, Lovenox and TED hose both lege 30 days."    Please advise if this appointment will require Xray.  If so, we will need order for Xray to be done prior.

## 2011-05-08 NOTE — Telephone Encounter (Signed)
No x rays   Instructions are correct

## 2011-05-09 NOTE — H&P (Unsigned)
[name], [name]               ACCOUNT NO.:  [id number]  MEDICAL RECORD NO.:  [id number]  LOCATION:                                 FACILITY:  PHYSICIAN:  Montavius Subramaniam D. Felecia Shelling, MD   DATE OF BIRTH:  12-06-1945  DATE OF ADMISSION: DATE OF DISCHARGE:  LH                             HISTORY & PHYSICAL   Location of the patient, currently [facility] is in [facility].  HISTORY OF PRESENT ILLNESS:  This is a 66 year old female patient with history of multiple medical illnesses who was recently admitted to [facility] due to fall and a right hip fracture.  The patient underwent right hip arthroplasty.  [facility] is transferred to [facility] for physical therapy and skilled nursing care.  The patient recently had multiple falls after she was transferred to nursing home.  She is participating with physical therapy and feeling slightly better.  REVIEW OF SYSTEMS:  No fever, chills, cough, chest pain, nausea, vomiting, abdominal pain, dysuria, urgency, or frequency of urination.  PAST MEDICAL HISTORY: 1. Right hip fracture and status post arthroplasty. 2. Postoperative anemia. 3. Hypokalemia. 4. Anxiety depression disorder. 5. History of irritable bowel syndrome. 6. History of chronic diarrhea. 7. History of gastroesophageal reflux disease.  CURRENT MEDICATIONS: 1. Xanax 1 mg p.o. 4 times a day. 2. Bisacodyl 5 mg daily as needed. 3. Wellbutrin XL 150 mg daily. 4. Bentyl 10 mg p.o. t.i.d. 5. Lovenox 30 mg subcu daily. 6. Toviaz 4 mg daily. 7. Prozac 20 mg daily. 8. Norco 5/325 one tablet p.o. q.4 hours p.r.n. 9. Zyprexa 10 mg at bedtime. 10.Prilosec 20 mg daily. 11.KCl 20 mEq daily. 12.Simvastatin 20 mg p.o. daily.  SOCIAL HISTORY:  The patient was a resident of [facility].  No history of alcohol, tobacco, or substance abuse.  PHYSICAL EXAMINATION:  GENERAL/VITAL SIGNS:  The patient is alert, awake, and sick looking with vitals blood pressure 130/80, pulse  88, respiratory rate 20, temperature 98 degrees Fahrenheit. HEENT:  Pupils are equal and reactive. NECK:  Supple. CHEST:  Clear lung fields.  Good air entry. CARDIOVASCULAR SYSTEM:  First and second heart sounds heard.  No murmur. No gallop. ABDOMEN:  Soft and lax.  Bowel sound is positive.  No mass or organomegaly. EXTREMITIES:  Surgical site is clean and healing.  No leg edema.  ASSESSMENT: 1. Right hip fracture and status post arthroplasty. 2. History of postoperative anemia and status post transfusion. 3. Hypokalemia. 4. History of anxiety depression disorder. 5. Irritable bowel syndrome. 6. History of chronic diarrhea.  PLAN:  Continue the patient on physical therapy, occupational therapy. Continue current medications.  We will do CBC, BMP.  Continue assisting in daily living activity.     Ruston Fedora D. Felecia Shelling, MD     TDF/MEDQ  D:  05/08/2011  T:  05/08/2011  Job:  [id number]

## 2011-05-21 ENCOUNTER — Telehealth: Payer: Self-pay | Admitting: Orthopedic Surgery

## 2011-05-21 NOTE — Telephone Encounter (Signed)
You'll have to bring her in tomorrow   FYI: Wound problems always can be seen

## 2011-05-21 NOTE — Telephone Encounter (Signed)
Spoke with Vicky/[facility] and scheduled patient for 05/22/11 for wound check

## 2011-05-21 NOTE — Telephone Encounter (Signed)
Per telephone call from Shontale/[facility] this morning, she says [name] has a bloody drainage coming from the right hip incision. She said Dr. Felecia Shelling gave her 1 shot of Rocephin yesterday, did a wound culture and wanted you to see her today.  Due to your surgery schedule and instructions to not add to today's clinic schedule, wasn't sure what to do.  Please advise. Shontale's # at [facility] [phone]

## 2011-05-22 ENCOUNTER — Inpatient Hospital Stay (HOSPITAL_COMMUNITY)
Admission: AD | Admit: 2011-05-22 | Discharge: 2011-05-30 | DRG: 903 | Disposition: A | Discharge status: Skilled Nursing Facility | Payer: Medicare Other | Source: Ambulatory Visit | Attending: Orthopedic Surgery | Admitting: Orthopedic Surgery

## 2011-05-22 ENCOUNTER — Encounter (HOSPITAL_COMMUNITY): Payer: Self-pay | Admitting: Orthopedic Surgery

## 2011-05-22 ENCOUNTER — Ambulatory Visit (INDEPENDENT_AMBULATORY_CARE_PROVIDER_SITE_OTHER): Payer: Medicare Other | Admitting: Orthopedic Surgery

## 2011-05-22 ENCOUNTER — Encounter: Payer: Self-pay | Admitting: Orthopedic Surgery

## 2011-05-22 VITALS — BP 118/60 | Ht 67.0 in | Wt 158.0 lb

## 2011-05-22 DIAGNOSIS — K589 Irritable bowel syndrome without diarrhea: Secondary | ICD-10-CM | POA: Diagnosis present

## 2011-05-22 DIAGNOSIS — T8140XA Infection following a procedure, unspecified, initial encounter: Secondary | ICD-10-CM

## 2011-05-22 DIAGNOSIS — Y831 Surgical operation with implant of artificial internal device as the cause of abnormal reaction of the patient, or of later complication, without mention of misadventure at the time of the procedure: Secondary | ICD-10-CM | POA: Diagnosis present

## 2011-05-22 DIAGNOSIS — Z96649 Presence of unspecified artificial hip joint: Secondary | ICD-10-CM

## 2011-05-22 DIAGNOSIS — T8132XA Disruption of internal operation (surgical) wound, not elsewhere classified, initial encounter: Principal | ICD-10-CM | POA: Diagnosis present

## 2011-05-22 DIAGNOSIS — T81329A Deep disruption or dehiscence of operation wound, unspecified, initial encounter: Principal | ICD-10-CM | POA: Diagnosis present

## 2011-05-22 DIAGNOSIS — F329 Major depressive disorder, single episode, unspecified: Secondary | ICD-10-CM | POA: Diagnosis present

## 2011-05-22 DIAGNOSIS — F3289 Other specified depressive episodes: Secondary | ICD-10-CM | POA: Diagnosis present

## 2011-05-22 DIAGNOSIS — R197 Diarrhea, unspecified: Secondary | ICD-10-CM | POA: Diagnosis not present

## 2011-05-22 DIAGNOSIS — M109 Gout, unspecified: Secondary | ICD-10-CM | POA: Diagnosis present

## 2011-05-22 DIAGNOSIS — F411 Generalized anxiety disorder: Secondary | ICD-10-CM | POA: Diagnosis present

## 2011-05-22 DIAGNOSIS — D649 Anemia, unspecified: Secondary | ICD-10-CM | POA: Diagnosis not present

## 2011-05-22 DIAGNOSIS — T8149XA Infection following a procedure, other surgical site, initial encounter: Secondary | ICD-10-CM

## 2011-05-22 DIAGNOSIS — Z79899 Other long term (current) drug therapy: Secondary | ICD-10-CM

## 2011-05-22 DIAGNOSIS — K219 Gastro-esophageal reflux disease without esophagitis: Secondary | ICD-10-CM | POA: Diagnosis present

## 2011-05-22 DIAGNOSIS — Z888 Allergy status to other drugs, medicaments and biological substances status: Secondary | ICD-10-CM

## 2011-05-22 LAB — CBC
MCH: 29.1 pg (ref 26.0–34.0)
MCV: 90.2 fL (ref 78.0–100.0)
Platelets: 439 10*3/uL — ABNORMAL HIGH (ref 150–400)
RDW: 13.7 % (ref 11.5–15.5)

## 2011-05-22 LAB — COMPREHENSIVE METABOLIC PANEL
AST: 14 U/L (ref 0–37)
Albumin: 2.6 g/dL — ABNORMAL LOW (ref 3.5–5.2)
BUN: 11 mg/dL (ref 6–23)
Calcium: 9.4 mg/dL (ref 8.4–10.5)
Creatinine, Ser: 0.74 mg/dL (ref 0.50–1.10)
GFR calc non Af Amer: 87 mL/min — ABNORMAL LOW (ref >90)

## 2011-05-22 LAB — PROTIME-INR
INR: 1.04 (ref 0.00–1.49)
Prothrombin Time: 13.8 seconds (ref 11.6–15.2)

## 2011-05-22 LAB — APTT: aPTT: 36 seconds (ref 24–37)

## 2011-05-22 MED ORDER — FLUOXETINE HCL 20 MG PO CAPS
60.0000 mg | ORAL_CAPSULE | Freq: Every day | ORAL | Status: DC
  Administered 2011-05-22 – 2011-05-23 (×2): 60 mg via ORAL
  Filled 2011-05-22: qty 3
  Filled 2011-05-22: qty 2
  Filled 2011-05-22: qty 1

## 2011-05-22 MED ORDER — HYDROCODONE-ACETAMINOPHEN 5-325 MG PO TABS
1.0000 | ORAL_TABLET | ORAL | Status: DC | PRN
  Administered 2011-05-22 – 2011-05-23 (×2): 1 via ORAL
  Administered 2011-05-23: 2 via ORAL
  Administered 2011-05-23 – 2011-05-24 (×2): 1 via ORAL
  Filled 2011-05-22: qty 2
  Filled 2011-05-22 (×4): qty 1

## 2011-05-22 MED ORDER — DICYCLOMINE HCL 10 MG PO CAPS
10.0000 mg | ORAL_CAPSULE | Freq: Three times a day (TID) | ORAL | Status: DC
  Administered 2011-05-22 – 2011-05-24 (×5): 10 mg via ORAL
  Filled 2011-05-22 (×5): qty 1

## 2011-05-22 MED ORDER — VANCOMYCIN HCL 1000 MG IV SOLR
750.0000 mg | Freq: Two times a day (BID) | INTRAVENOUS | Status: DC
  Administered 2011-05-22 – 2011-05-23 (×2): 750 mg via INTRAVENOUS
  Filled 2011-05-22 (×2): qty 750

## 2011-05-22 MED ORDER — OLANZAPINE 5 MG PO TABS
10.0000 mg | ORAL_TABLET | Freq: Every day | ORAL | Status: DC
  Administered 2011-05-22 – 2011-05-24 (×2): 10 mg via ORAL
  Filled 2011-05-22 (×2): qty 2

## 2011-05-22 MED ORDER — BUPROPION HCL ER (XL) 150 MG PO TB24
450.0000 mg | ORAL_TABLET | Freq: Every day | ORAL | Status: DC
  Administered 2011-05-22 – 2011-05-23 (×2): 450 mg via ORAL
  Filled 2011-05-22 (×5): qty 3

## 2011-05-22 MED ORDER — FESOTERODINE FUMARATE ER 4 MG PO TB24
4.0000 mg | ORAL_TABLET | Freq: Every day | ORAL | Status: DC
  Administered 2011-05-22 – 2011-05-23 (×2): 4 mg via ORAL
  Filled 2011-05-22 (×4): qty 1

## 2011-05-22 MED ORDER — SIMVASTATIN 20 MG PO TABS
20.0000 mg | ORAL_TABLET | Freq: Every evening | ORAL | Status: DC
  Administered 2011-05-23: 20 mg via ORAL
  Filled 2011-05-22: qty 1

## 2011-05-22 MED ORDER — PANTOPRAZOLE SODIUM 40 MG PO TBEC
40.0000 mg | DELAYED_RELEASE_TABLET | Freq: Every day | ORAL | Status: DC
  Administered 2011-05-22 – 2011-05-23 (×2): 40 mg via ORAL
  Filled 2011-05-22 (×2): qty 1

## 2011-05-22 MED ORDER — SODIUM CHLORIDE 0.9 % IJ SOLN
INTRAMUSCULAR | Status: AC
  Administered 2011-05-22: 17:00:00
  Filled 2011-05-22: qty 3

## 2011-05-22 MED ORDER — POTASSIUM CHLORIDE CRYS ER 20 MEQ PO TBCR
20.0000 meq | EXTENDED_RELEASE_TABLET | Freq: Every day | ORAL | Status: DC
  Administered 2011-05-22 – 2011-05-23 (×2): 20 meq via ORAL
  Filled 2011-05-22 (×2): qty 1

## 2011-05-22 MED ORDER — MORPHINE SULFATE 2 MG/ML IJ SOLN: 0.5000 mg | INTRAMUSCULAR | Status: DC | PRN

## 2011-05-22 NOTE — H&P (Signed)
[name] is an 66 y.o. female.   Chief Complaint: draining wound  HPI: s/p admit for right hip fracture on 2/3, had surgery on 2/4, discharged 2/8 developed wound drainage 2/25. Seen in office with purulent drainage. Pain and nausea and diarrhea.     Past Medical History  Diagnosis Date  . IBS (irritable bowel syndrome)   . Depression   . GERD (gastroesophageal reflux disease)   . Gout     Past Surgical History  Procedure Date  . Hip arthroplasty 05/01/2011    Procedure: right ARTHROPLASTY BIPOLAR HIP;  Surgeon: Fuller Canada, MD;  Location: [facility];  Service: Orthopedics;  Laterality: Right;    History reviewed. No pertinent family history. Social History:  reports that she has never smoked. She does not have any smokeless tobacco history on file. She reports that she does not drink alcohol or use illicit drugs.  Allergies:  Allergies  Allergen Reactions  . Diphenhydramine Other (See Comments)    Feels bad    Medications Prior to Admission  Medication Dose Route Frequency Provider Last Rate Last Dose  . sodium chloride 0.9 % injection            Medications Prior to Admission  Medication Sig Dispense Refill  . bisacodyl (DULCOLAX) 5 MG EC tablet Take 1 tablet (5 mg total) by mouth daily as needed.  30 tablet  0  . buPROPion (WELLBUTRIN XL) 150 MG 24 hr tablet Take 450 mg by mouth daily.      Marland Kitchen dicyclomine (BENTYL) 10 MG capsule Take 1 capsule (10 mg total) by mouth 4 (four) times daily -  before meals and at bedtime.  40 capsule  0  . enoxaparin (LOVENOX) 30 MG/0.3ML SOLN Inject 0.3 mLs (30 mg total) into the skin daily.  8.4 mL  30  . fesoterodine (TOVIAZ) 4 MG TB24 Take 1 tablet (4 mg total) by mouth daily.  30 tablet  0  . FLUoxetine (PROZAC) 20 MG capsule Take 60 mg by mouth daily.      Marland Kitchen OLANZapine (ZYPREXA) 10 MG tablet Take 1 tablet (10 mg total) by mouth at bedtime.  30 tablet  0  . omeprazole (PRILOSEC) 20 MG capsule Take 20 mg by mouth daily.      .  potassium chloride SA (K-DUR,KLOR-CON) 20 MEQ tablet Take 1 tablet (20 mEq total) by mouth daily.  30 tablet  0  . simvastatin (ZOCOR) 20 MG tablet Take 20 mg by mouth every evening.        No results found for this or any previous visit (from the past 48 hour(s)). No results found.  Review of Systems  Constitutional: Positive for malaise/fatigue. Negative for fever, chills, weight loss and diaphoresis.  HENT: Negative.   Eyes: Negative.   Respiratory: Negative.   Cardiovascular: Negative.   Gastrointestinal: Positive for nausea and diarrhea.  Genitourinary: Negative.   Musculoskeletal: Positive for myalgias.  Skin: Negative for itching and rash.  Neurological: Negative.  Negative for weakness.  Endo/Heme/Allergies: Negative for environmental allergies and polydipsia. Does not bruise/bleed easily.  Psychiatric/Behavioral: Positive for depression.    Blood pressure 117/63, pulse 62, temperature 98.8 F (37.1 C), temperature source Oral, resp. rate 18, height 5\' 7"  (1.702 m), weight 65.862 kg (145 lb 3.2 oz), SpO2 96.00%. Physical Exam  Constitutional: She appears well-developed and well-nourished. No distress.  HENT:  Head: Normocephalic and atraumatic.  Eyes: Pupils are equal, round, and reactive to light.  Neck: Normal range of motion. Neck  supple. No JVD present. No tracheal deviation present. No thyromegaly present.  Cardiovascular: Normal rate.   Respiratory: Effort normal.  GI: Soft.  Musculoskeletal: She exhibits no edema.       Right shoulder: Normal.       Left shoulder: Normal.       Right elbow: Normal.      Left elbow: Normal.       Right wrist: Normal.       Left wrist: Normal.       Right hip: She exhibits decreased range of motion, decreased strength, tenderness and swelling. She exhibits no deformity.       Left hip: Normal.       Right knee: Normal.       Left knee: Normal.       Right ankle: Normal.       Left ankle: Normal.       Legs: Lymphadenopathy:     She has no cervical adenopathy.  Neurological: She is alert. No cranial nerve deficit.  Skin: Skin is warm and dry. She is not diaphoretic. There is erythema.  Psychiatric: She has a normal mood and affect. Her behavior is normal. Judgment and thought content normal.     Assessment/Plan Post op wound infection   IV antibiotics re assess daily for need for surgery   Fuller Canada 05/22/2011, 2:01 PM

## 2011-05-22 NOTE — Progress Notes (Signed)
ANTIBIOTIC CONSULT NOTE - INITIAL  Pharmacy Consult for Vancomycin Indication:  Post op wound infection  Allergies  Allergen Reactions  . Diphenhydramine Other (See Comments)    Feels bad    Patient Measurements: Height: 5\' 7"  (170.2 cm) Weight: 145 lb 3.2 oz (65.862 kg) IBW/kg (Calculated) : 61.6  Adjusted Body Weight: n/a Vital Signs: Temp: 98.8 F (37.1 C) (02/26 1229) Temp src: Oral (02/26 1229) BP: 117/63 mmHg (02/26 1229) Pulse Rate: 62  (02/26 1229) Intake/Output from previous day:   Intake/Output from this shift: Total I/O In: -  Out: 500 [Urine:500]  Labs: No results found for this basename: WBC:3,HGB:3,PLT:3,LABCREA:3,CREATININE:3 in the last 72 hours Estimated Creatinine Clearance: 67.3 ml/min (by C-G formula based on Cr of 0.56).    Microbiology: Recent Results (from the past 720 hour(s))  MRSA PCR SCREENING     Status: Normal   Collection Time   05/22/11 12:48 PM      Component Value Range Status Comment   MRSA by PCR NEGATIVE  NEGATIVE  Final     Medical History: Past Medical History  Diagnosis Date  . IBS (irritable bowel syndrome)   . Depression   . GERD (gastroesophageal reflux disease)   . Gout     Medications:  Scheduled:    . buPROPion  450 mg Oral Daily  . dicyclomine  10 mg Oral TID AC & HS  . fesoterodine  4 mg Oral Daily  . FLUoxetine  60 mg Oral Daily  . OLANZapine  10 mg Oral QHS  . pantoprazole  40 mg Oral Q1200  . potassium chloride SA  20 mEq Oral Daily  . simvastatin  20 mg Oral QPM  . sodium chloride      . vancomycin  750 mg Intravenous Q12H   Assessment: Empiric therapy.  Goal of Therapy:  Vancomycin trough level 10-15 mcg/ml  Plan:  Measure antibiotic drug levels at steady state Follow up culture results Vancomycin 750 mg IV every 12 hours.  Gilman Buttner, [place] J 05/22/2011,5:18 PM

## 2011-05-22 NOTE — Patient Instructions (Signed)
ADMIT PATIENT TO HOSPITAL FOR WOUND INFECTION

## 2011-05-22 NOTE — Progress Notes (Signed)
Patient ID: [name], female   DOB: 1946/01/24, 66 y.o.   MRN: [id number] Chief Complaint  Patient presents with  . Wound Check    post op wound check    The patient had surgery 3 weeks ago presents now with draining wound of purulence and blood  She received a gram of antibiotics yesterday he was brought in today for wound check.  Recommend admission to the hospital for IV antibiotics and wound care with potential need for irrigation debridement  Status post bipolar replacement proximally 3 weeks ago.  Patient resides at [facility]

## 2011-05-23 LAB — TYPE AND SCREEN
ABO/RH(D): O POS
Antibody Screen: NEGATIVE

## 2011-05-23 MED ORDER — CHLORHEXIDINE GLUCONATE 4 % EX LIQD
60.0000 mL | Freq: Once | CUTANEOUS | Status: DC
  Filled 2011-05-23: qty 60

## 2011-05-23 MED ORDER — ALPRAZOLAM 0.5 MG PO TABS
0.5000 mg | ORAL_TABLET | Freq: Three times a day (TID) | ORAL | Status: DC
  Administered 2011-05-23 – 2011-05-30 (×20): 0.5 mg via ORAL
  Filled 2011-05-23 (×20): qty 1

## 2011-05-23 MED ORDER — OLANZAPINE 5 MG PO TABS
5.0000 mg | ORAL_TABLET | Freq: Every day | ORAL | Status: DC
  Administered 2011-05-24 – 2011-05-29 (×6): 5 mg via ORAL
  Filled 2011-05-23 (×7): qty 1

## 2011-05-23 MED ORDER — LOPERAMIDE HCL 2 MG PO CAPS
2.0000 mg | ORAL_CAPSULE | Freq: Four times a day (QID) | ORAL | Status: DC | PRN
Start: 2011-05-23 — End: 2011-05-27
  Administered 2011-05-25: 2 mg via ORAL
  Filled 2011-05-23: qty 1

## 2011-05-23 NOTE — Progress Notes (Signed)
Subjective: Malaise, no diarrhea    Objective: Vital signs in last 24 hours: Temp:  [98.2 F (36.8 C)-98.8 F (37.1 C)] 98.2 F (36.8 C) (02/27 0514) Pulse Rate:  [62-67] 67  (02/27 0514) Resp:  [18-20] 20  (02/27 0514) BP: (97-118)/(60-65) 111/65 mmHg (02/27 0514) SpO2:  [96 %-97 %] 97 % (02/27 0514) Weight:  [65.862 kg (145 lb 3.2 oz)-71.668 kg (158 lb)] 65.862 kg (145 lb 3.2 oz) (02/26 1229)  Intake/Output from previous day: 02/26 0701 - 02/27 0700 In: 240 [P.O.:240] Out: 1650 [Urine:1650] Intake/Output this shift: Total I/O In: -  Out: 900 [Urine:900]   Basename 05/22/11 1815  HGB 11.0*    Basename 05/22/11 1815  WBC 5.9  RBC 3.78*  HCT 34.1*  PLT 439*    Basename 05/22/11 1815  NA 140  K 3.6  CL 104  CO2 27  BUN 11  CREATININE 0.74  GLUCOSE 100*  CALCIUM 9.4    Basename 05/22/11 1815  LABPT --  INR 1.04    Incision: heavy drainage   Assessment/Plan: Heavy wound drainage , recommend wound irrigation and debridement Thursday    Fuller Canada 05/23/2011, 6:37 AM

## 2011-05-24 ENCOUNTER — Encounter (HOSPITAL_COMMUNITY): Payer: Self-pay | Admitting: Anesthesiology

## 2011-05-24 ENCOUNTER — Encounter (HOSPITAL_COMMUNITY): Payer: Self-pay | Admitting: *Deleted

## 2011-05-24 ENCOUNTER — Encounter (HOSPITAL_COMMUNITY)
Admission: AD | Disposition: A | Discharge status: Skilled Nursing Facility | Payer: Self-pay | Source: Ambulatory Visit | Attending: Orthopedic Surgery

## 2011-05-24 ENCOUNTER — Inpatient Hospital Stay (HOSPITAL_COMMUNITY): Payer: Medicare Other | Admitting: Anesthesiology

## 2011-05-24 DIAGNOSIS — IMO0002 Reserved for concepts with insufficient information to code with codable children: Secondary | ICD-10-CM

## 2011-05-24 DIAGNOSIS — T8450XA Infection and inflammatory reaction due to unspecified internal joint prosthesis, initial encounter: Secondary | ICD-10-CM

## 2011-05-24 HISTORY — PX: INCISION AND DRAINAGE OF WOUND: SHX1803

## 2011-05-24 SURGERY — IRRIGATION AND DEBRIDEMENT WOUND
Anesthesia: Spinal | Site: Hip | Laterality: Right | Wound class: Dirty or Infected

## 2011-05-24 MED ORDER — ACETAMINOPHEN 10 MG/ML IV SOLN
1000.0000 mg | Freq: Once | INTRAVENOUS | Status: AC
  Administered 2011-05-24: 1000 mg via INTRAVENOUS

## 2011-05-24 MED ORDER — PROPOFOL 10 MG/ML IV EMUL
INTRAVENOUS | Status: DC | PRN
  Administered 2011-05-24: 35 ug/kg/min via INTRAVENOUS

## 2011-05-24 MED ORDER — MAGNESIUM CITRATE PO SOLN: 1.0000 | Freq: Once | ORAL | Status: AC | PRN

## 2011-05-24 MED ORDER — ONDANSETRON HCL 4 MG/2ML IJ SOLN
4.0000 mg | Freq: Four times a day (QID) | INTRAMUSCULAR | Status: DC | PRN
  Administered 2011-05-24 – 2011-05-25 (×3): 4 mg via INTRAVENOUS
  Filled 2011-05-24 (×3): qty 2

## 2011-05-24 MED ORDER — TRAMADOL HCL 50 MG PO TABS
50.0000 mg | ORAL_TABLET | Freq: Four times a day (QID) | ORAL | Status: DC
  Administered 2011-05-24 – 2011-05-25 (×2): 50 mg via ORAL
  Filled 2011-05-24 (×5): qty 1

## 2011-05-24 MED ORDER — SENNA 8.6 MG PO TABS
1.0000 | ORAL_TABLET | Freq: Two times a day (BID) | ORAL | Status: DC
  Filled 2011-05-24 (×2): qty 1

## 2011-05-24 MED ORDER — TRAMADOL HCL 50 MG PO TABS
50.0000 mg | ORAL_TABLET | Freq: Once | ORAL | Status: AC
  Administered 2011-05-24: 50 mg via ORAL
  Filled 2011-05-24 (×2): qty 1

## 2011-05-24 MED ORDER — ALUM & MAG HYDROXIDE-SIMETH 200-200-20 MG/5ML PO SUSP: 30.0000 mL | ORAL | Status: DC | PRN

## 2011-05-24 MED ORDER — POLYETHYLENE GLYCOL 3350 17 G PO PACK
17.0000 g | PACK | Freq: Every day | ORAL | Status: DC
  Administered 2011-05-24: 17 g via ORAL
  Filled 2011-05-24 (×3): qty 1

## 2011-05-24 MED ORDER — VANCOMYCIN HCL IN DEXTROSE 1-5 GM/200ML-% IV SOLN
1000.0000 mg | Freq: Two times a day (BID) | INTRAVENOUS | Status: AC
  Administered 2011-05-24: 1000 mg via INTRAVENOUS
  Filled 2011-05-24: qty 200

## 2011-05-24 MED ORDER — PROPOFOL 10 MG/ML IV EMUL
INTRAVENOUS | Status: AC
  Filled 2011-05-24: qty 20

## 2011-05-24 MED ORDER — ONDANSETRON HCL 4 MG/2ML IJ SOLN: 4.0000 mg | Freq: Once | INTRAMUSCULAR | Status: DC | PRN

## 2011-05-24 MED ORDER — MIDAZOLAM HCL 2 MG/2ML IJ SOLN
INTRAMUSCULAR | Status: AC
  Administered 2011-05-24: 1 mg via INTRAVENOUS
  Filled 2011-05-24: qty 2

## 2011-05-24 MED ORDER — ZOLPIDEM TARTRATE 5 MG PO TABS
5.0000 mg | ORAL_TABLET | Freq: Every evening | ORAL | Status: DC | PRN
  Administered 2011-05-25: 5 mg via ORAL
  Filled 2011-05-24: qty 1

## 2011-05-24 MED ORDER — MIDAZOLAM HCL 2 MG/2ML IJ SOLN
1.0000 mg | INTRAMUSCULAR | Status: DC | PRN
  Administered 2011-05-24 (×2): 1 mg via INTRAVENOUS

## 2011-05-24 MED ORDER — MIDAZOLAM HCL 2 MG/2ML IJ SOLN
INTRAMUSCULAR | Status: AC
  Filled 2011-05-24: qty 2

## 2011-05-24 MED ORDER — METOCLOPRAMIDE HCL 5 MG/ML IJ SOLN: 5.0000 mg | Freq: Three times a day (TID) | INTRAMUSCULAR | Status: DC | PRN

## 2011-05-24 MED ORDER — LACTATED RINGERS IV SOLN
INTRAVENOUS | Status: DC
  Administered 2011-05-24: 12:00:00 via INTRAVENOUS

## 2011-05-24 MED ORDER — MIDAZOLAM HCL 5 MG/5ML IJ SOLN
INTRAMUSCULAR | Status: DC | PRN
  Administered 2011-05-24: 2 mg via INTRAVENOUS

## 2011-05-24 MED ORDER — FENTANYL CITRATE 0.05 MG/ML IJ SOLN
INTRAMUSCULAR | Status: AC
  Administered 2011-05-24: 25 ug via INTRAVENOUS
  Filled 2011-05-24: qty 2

## 2011-05-24 MED ORDER — SODIUM CHLORIDE 0.9 % IV SOLN
INTRAVENOUS | Status: DC
  Administered 2011-05-24 – 2011-05-27 (×6): via INTRAVENOUS

## 2011-05-24 MED ORDER — BUPIVACAINE IN DEXTROSE 0.75-8.25 % IT SOLN
INTRATHECAL | Status: AC
  Filled 2011-05-24: qty 2

## 2011-05-24 MED ORDER — METHOCARBAMOL 500 MG PO TABS
500.0000 mg | ORAL_TABLET | Freq: Four times a day (QID) | ORAL | Status: DC | PRN
  Administered 2011-05-24 – 2011-05-25 (×2): 500 mg via ORAL
  Filled 2011-05-24 (×2): qty 1

## 2011-05-24 MED ORDER — METHOCARBAMOL 100 MG/ML IJ SOLN
500.0000 mg | Freq: Four times a day (QID) | INTRAVENOUS | Status: DC | PRN
  Filled 2011-05-24: qty 5

## 2011-05-24 MED ORDER — FENTANYL CITRATE 0.05 MG/ML IJ SOLN
25.0000 ug | INTRAMUSCULAR | Status: DC | PRN
  Administered 2011-05-24: 25 ug via INTRAVENOUS

## 2011-05-24 MED ORDER — PHENOL 1.4 % MT LIQD: 1.0000 | OROMUCOSAL | Status: DC | PRN

## 2011-05-24 MED ORDER — ACETAMINOPHEN 10 MG/ML IV SOLN
INTRAVENOUS | Status: AC
  Filled 2011-05-24: qty 100

## 2011-05-24 MED ORDER — EPHEDRINE SULFATE 50 MG/ML IJ SOLN
INTRAMUSCULAR | Status: DC | PRN
  Administered 2011-05-24 (×2): 10 mg via INTRAVENOUS
  Administered 2011-05-24: 5 mg via INTRAVENOUS

## 2011-05-24 MED ORDER — DOCUSATE SODIUM 100 MG PO CAPS
100.0000 mg | ORAL_CAPSULE | Freq: Two times a day (BID) | ORAL | Status: DC
  Filled 2011-05-24 (×2): qty 1

## 2011-05-24 MED ORDER — FENTANYL CITRATE 0.05 MG/ML IJ SOLN
INTRAMUSCULAR | Status: DC | PRN
  Administered 2011-05-24 (×3): 25 ug via INTRAVENOUS

## 2011-05-24 MED ORDER — ACETAMINOPHEN 10 MG/ML IV SOLN
1000.0000 mg | Freq: Four times a day (QID) | INTRAVENOUS | Status: AC
  Administered 2011-05-24 – 2011-05-25 (×3): 1000 mg via INTRAVENOUS
  Filled 2011-05-24 (×3): qty 100

## 2011-05-24 MED ORDER — METOCLOPRAMIDE HCL 10 MG PO TABS: 5.0000 mg | ORAL_TABLET | Freq: Three times a day (TID) | ORAL | Status: DC | PRN

## 2011-05-24 MED ORDER — ACETAMINOPHEN 650 MG RE SUPP: 650.0000 mg | Freq: Four times a day (QID) | RECTAL | Status: DC | PRN

## 2011-05-24 MED ORDER — ACETAMINOPHEN 325 MG PO TABS: 650.0000 mg | ORAL_TABLET | Freq: Four times a day (QID) | ORAL | Status: DC | PRN

## 2011-05-24 MED ORDER — TRAMADOL HCL 50 MG PO TABS
ORAL_TABLET | ORAL | Status: AC
  Filled 2011-05-24: qty 1

## 2011-05-24 MED ORDER — SODIUM CHLORIDE 0.9 % IR SOLN
Status: DC | PRN
  Administered 2011-05-24: 3000 mL

## 2011-05-24 MED ORDER — ENOXAPARIN SODIUM 40 MG/0.4ML ~~LOC~~ SOLN
40.0000 mg | SUBCUTANEOUS | Status: DC
  Administered 2011-05-25 – 2011-05-29 (×5): 40 mg via SUBCUTANEOUS
  Filled 2011-05-24 (×5): qty 0.4

## 2011-05-24 MED ORDER — FENTANYL CITRATE 0.05 MG/ML IJ SOLN
INTRAMUSCULAR | Status: DC | PRN
  Administered 2011-05-24: 25 ug via INTRAVENOUS

## 2011-05-24 MED ORDER — SORBITOL 70 % SOLN
30.0000 mL | Freq: Every day | Status: DC | PRN
  Filled 2011-05-24: qty 30

## 2011-05-24 MED ORDER — SODIUM CHLORIDE 0.9 % IR SOLN
Status: DC | PRN
  Administered 2011-05-24: 1000 mL

## 2011-05-24 MED ORDER — BUPIVACAINE IN DEXTROSE 0.75-8.25 % IT SOLN
INTRATHECAL | Status: DC | PRN
  Administered 2011-05-24: 8 mg via INTRATHECAL

## 2011-05-24 MED ORDER — ONDANSETRON HCL 4 MG PO TABS
4.0000 mg | ORAL_TABLET | Freq: Four times a day (QID) | ORAL | Status: DC | PRN
  Administered 2011-05-26 – 2011-05-30 (×8): 4 mg via ORAL
  Filled 2011-05-24 (×8): qty 1

## 2011-05-24 MED ORDER — MENTHOL 3 MG MT LOZG: 1.0000 | LOZENGE | OROMUCOSAL | Status: DC | PRN

## 2011-05-24 MED ORDER — DIPHENHYDRAMINE HCL 12.5 MG/5ML PO ELIX: 12.5000 mg | ORAL_SOLUTION | ORAL | Status: DC | PRN

## 2011-05-24 MED ORDER — FENTANYL CITRATE 0.05 MG/ML IJ SOLN
INTRAMUSCULAR | Status: AC
  Filled 2011-05-24: qty 2

## 2011-05-24 SURGICAL SUPPLY — 62 items
ANCHOR CORKSCREW 5.0 FIBERWIRE (Anchor) ×1 IMPLANT
BAG HAMPER (MISCELLANEOUS) ×2 IMPLANT
BANDAGE ELASTIC 4 VELCRO NS (GAUZE/BANDAGES/DRESSINGS) IMPLANT
BANDAGE ELASTIC 6 VELCRO NS (GAUZE/BANDAGES/DRESSINGS) IMPLANT
BANDAGE ESMARK 4X12 BL STRL LF (DISPOSABLE) ×1 IMPLANT
BNDG CMPR 12X4 ELC STRL LF (DISPOSABLE) ×1
BNDG COHESIVE 4X5 TAN NS LF (GAUZE/BANDAGES/DRESSINGS) IMPLANT
BNDG ESMARK 4X12 BLUE STRL LF (DISPOSABLE) ×2
CHLORAPREP W/TINT 26ML (MISCELLANEOUS) ×1 IMPLANT
CLOTH BEACON ORANGE TIMEOUT ST (SAFETY) ×2 IMPLANT
COVER LIGHT HANDLE STERIS (MISCELLANEOUS) ×4 IMPLANT
CUFF TOURNIQUET SINGLE 18IN (TOURNIQUET CUFF) IMPLANT
CUFF TOURNIQUET SINGLE 24IN (TOURNIQUET CUFF) IMPLANT
DRAPE INCISE IOBAN 44X35 STRL (DRAPES) IMPLANT
DRAPE ORTHO 2.5IN SPLIT 77X108 (DRAPES) IMPLANT
DRAPE ORTHO SPLIT 77X108 STRL (DRAPES) ×4
DRAPE U-SHAPE 47X51 STRL (DRAPES) ×1 IMPLANT
DRESSING GELFORM 100 (GAUZE/BANDAGES/DRESSINGS) IMPLANT
DRSG TEGADERM 4X4.75 (GAUZE/BANDAGES/DRESSINGS) ×1 IMPLANT
FORMALIN 10 PREFIL 480ML (MISCELLANEOUS) ×1 IMPLANT
GAUZE PACKING IODOFORM 1 (PACKING) IMPLANT
GAUZE XEROFORM 5X9 LF (GAUZE/BANDAGES/DRESSINGS) ×1 IMPLANT
GLOVE BIOGEL PI IND STRL 7.0 (GLOVE) IMPLANT
GLOVE BIOGEL PI INDICATOR 7.0 (GLOVE) ×2
GLOVE ECLIPSE 7.0 STRL STRAW (GLOVE) ×1 IMPLANT
GLOVE EXAM NITRILE MD LF STRL (GLOVE) ×2 IMPLANT
GLOVE OPTIFIT SS 6.5 STRL BRWN (GLOVE) ×1 IMPLANT
GLOVE SKINSENSE NS SZ8.0 LF (GLOVE) ×1
GLOVE SKINSENSE STRL SZ8.0 LF (GLOVE) ×1 IMPLANT
GLOVE SS N UNI LF 8.5 STRL (GLOVE) ×2 IMPLANT
GOWN STRL REIN XL XLG (GOWN DISPOSABLE) ×6 IMPLANT
HANDPIECE INTERPULSE COAX TIP (DISPOSABLE) ×2
IV NS IRRIG 3000ML ARTHROMATIC (IV SOLUTION) ×1 IMPLANT
KIT ROOM TURNOVER APOR (KITS) ×2 IMPLANT
MANIFOLD NEPTUNE II (INSTRUMENTS) ×2 IMPLANT
NDL MA TROC 1/2 (NEEDLE) IMPLANT
NEEDLE MA TROC 1/2 (NEEDLE) ×2 IMPLANT
NS IRRIG 1000ML POUR BTL (IV SOLUTION) ×2 IMPLANT
PACK BASIC LIMB (CUSTOM PROCEDURE TRAY) ×2 IMPLANT
PAD ABD 5X9 TENDERSORB (GAUZE/BANDAGES/DRESSINGS) ×1 IMPLANT
PAD ARMBOARD 7.5X6 YLW CONV (MISCELLANEOUS) ×2 IMPLANT
PAD CAST 4YDX4 CTTN HI CHSV (CAST SUPPLIES) ×1 IMPLANT
PADDING CAST COTTON 4X4 STRL (CAST SUPPLIES)
SET BASIN LINEN APH (SET/KITS/TRAYS/PACK) ×2 IMPLANT
SET HNDPC FAN SPRY TIP SCT (DISPOSABLE) ×1 IMPLANT
SPONGE GAUZE 4X4 12PLY (GAUZE/BANDAGES/DRESSINGS) ×1 IMPLANT
SPONGE LAP 18X18 X RAY DECT (DISPOSABLE) ×2 IMPLANT
STAPLER VISISTAT 35W (STAPLE) ×1 IMPLANT
SUT BRALON NAB BRD #1 30IN (SUTURE) ×2 IMPLANT
SUT ETHILON 3 0 FSL (SUTURE) IMPLANT
SUT MON AB 2-0 CT1 36 (SUTURE) ×1 IMPLANT
SUT MON AB 2-0 SH 27 (SUTURE) ×2
SUT MON AB 2-0 SH27 (SUTURE) IMPLANT
SUT MON AB 3-0 SH 27 (SUTURE) IMPLANT
SUT VIC AB 1 CT1 27 (SUTURE) ×4
SUT VIC AB 1 CT1 27XBRD ANTBC (SUTURE) IMPLANT
SWAB CULTURE LIQ STUART DBL (MISCELLANEOUS) ×2 IMPLANT
SYR BULB IRRIGATION 50ML (SYRINGE) ×3 IMPLANT
TAPE CLOTH SURG 4X10 WHT LF (GAUZE/BANDAGES/DRESSINGS) ×1 IMPLANT
TUBE ANAEROBIC PORT A CUL  W/M (MISCELLANEOUS) ×2 IMPLANT
WATER STERILE IRR 1000ML POUR (IV SOLUTION) ×1 IMPLANT
YANKAUER SUCT BULB TIP 10FT TU (MISCELLANEOUS) ×2 IMPLANT

## 2011-05-24 NOTE — Anesthesia Preprocedure Evaluation (Signed)
Anesthesia Evaluation  Patient identified by MRN, date of birth, ID band Patient awake    Reviewed: Allergy & Precautions, H&P , NPO status , Patient's Chart, lab work & pertinent test results  History of Anesthesia Complications Negative for: history of anesthetic complications  Airway Mallampati: II      Dental  (+) Edentulous Upper and Edentulous Lower   Pulmonary  clear to auscultation        Cardiovascular neg cardio ROS Regular Normal    Neuro/Psych PSYCHIATRIC DISORDERS (dementia) Depression    GI/Hepatic GERD-  Controlled and Medicated,  Endo/Other    Renal/GU      Musculoskeletal   Abdominal   Peds  Hematology   Anesthesia Other Findings   Reproductive/Obstetrics                           Anesthesia Physical Anesthesia Plan  ASA: II  Anesthesia Plan: Spinal   Post-op Pain Management:    Induction: Intravenous  Airway Management Planned: Nasal Cannula  Additional Equipment:   Intra-op Plan:   Post-operative Plan:   Informed Consent: I have reviewed the patients History and Physical, chart, labs and discussed the procedure including the risks, benefits and alternatives for the proposed anesthesia with the patient or authorized representative who has indicated his/her understanding and acceptance.     Plan Discussed with:   Anesthesia Plan Comments:         Anesthesia Quick Evaluation

## 2011-05-24 NOTE — Anesthesia Postprocedure Evaluation (Addendum)
  Anesthesia Post-op Note  Patient: [name]  Procedure(s) Performed: Procedure(s) (LRB): IRRIGATION AND DEBRIDEMENT WOUND (Right) OPEN SURGICAL REPAIR OF GLUTEAL TENDON (Right)  Patient Location: PACU  Anesthesia Type: Spinal  Level of Consciousness: awake, alert , oriented and patient cooperative  Airway and Oxygen Therapy: Patient Spontanous Breathing  Post-op Pain: none  Post-op Assessment: Post-op Vital signs reviewed, Patient's Cardiovascular Status Stable, Respiratory Function Stable, Patent Airway and No signs of Nausea or vomiting  Post-op Vital Signs: Reviewed and stable  Complications: No apparent anesthesia complications 05/25/11  Patient up in a chair.  VSS.  Other than hip pain associated with surgery, patient has no complaints or concerns about anesthesia.  Denies headache, back pain and sensation returned to normal in lower extremities.

## 2011-05-24 NOTE — Anesthesia Procedure Notes (Signed)
Spinal  Patient location during procedure: OR Start time: 05/24/2011 1:25 PM Staffing CRNA/Resident: Doris Gruhn J Preanesthetic Checklist Completed: patient identified, site marked, surgical consent, pre-op evaluation, timeout performed, IV checked, risks and benefits discussed and monitors and equipment checked Spinal Block Patient position: right lateral decubitus Prep: Betadine Patient monitoring: heart rate, cardiac monitor, continuous pulse ox and blood pressure Approach: midline Location: L3-4 Injection technique: single-shot Needle Needle type: Spinocan  Needle gauge: 22 G Assessment Sensory level: T6 Additional Notes [id number]    Exp 01/2012

## 2011-05-24 NOTE — Progress Notes (Signed)
[name], [name]               ACCOUNT NO.:  [id number]  MEDICAL RECORD NO.:  [id number]  LOCATION:  A307                          FACILITY:  [facility]  PHYSICIAN:  Darianna Amy D. Felecia Shelling, MD   DATE OF BIRTH:  1945-12-03  DATE OF PROCEDURE:  05/24/2011 DATE OF DISCHARGE:                                PROGRESS NOTE   SUBJECTIVE:  The patient was admitted yesterday by Dr. Romeo Apple for a postoperative wound infection.  She is on vancomycin.  The patient is planned for debridement today.  She is complaining of pain and discomfort at the site of her surgery.  OBJECTIVE:  GENERAL:  The patient is alert, awake and chronically sick looking. VITALS:  Blood pressure 103/60, pulse 57, respiratory rate 18 and temperature 98.2 degrees Fahrenheit. CHEST:  Decreased air entry, few rhonchi. CARDIOVASCULAR SYSTEM:  First and second heart sounds heard.  No murmur. No gallop.  ABDOMEN:  Soft and lax.  Bowel sound is positive.  No mass, no organomegaly. EXTREMITIES:  No leg edema.  The patient has dressed wound on her right hip.  ASSESSMENT: 1. Postoperative wound infection. 2. History of right hip fracture and status post arthroplasty. 3. History of irritable bowel syndrome. 4. Anxiety depression disorder. 5. History of gastroesophageal reflux disease.  PLAN:  We will continue the patient on IV antibiotics.  Continue wound debridement as planned by Dr. Romeo Apple.  Continue regular medications.     Grover Woodfield D. Felecia Shelling, MD     TDF/MEDQ  D:  05/24/2011  T:  05/24/2011  Job:  [id number]

## 2011-05-24 NOTE — Op Note (Addendum)
05/22/2011 - 05/24/2011  2:48 PM  PATIENT:  [name]  66 y.o. female  PRE-OPERATIVE DIAGNOSIS:  wound infection right hip status post bipolar  POST-OPERATIVE DIAGNOSIS:  wound dehiscence right hip status post bipolar and gluteus medius partial tear  PROCEDURE:  Procedure(s) (LRB): IRRIGATION AND DEBRIDEMENT WOUND (Right) OPEN SURGICAL REPAIR OF GLUTEAL TENDON (Right)  Operative findings serous drainage right hip with fascial rent and partial tear of the gluteus medius tendon.  SURGEON:  Surgeon(s) and Role:    * Fuller Canada, MD - Primary  PHYSICIAN ASSISTANT:   ASSISTANTS: Valetta Close   ANESTHESIA:   spinal  EBL:  Total I/O In: 500 [I.V.:500] Out: 25 [Blood:25]  BLOOD ADMINISTERED:none  DRAINS: One Hemovac drain in the subcutaneous layer   LOCAL MEDICATIONS USED:  NONE  SPECIMEN:  Source of Specimen:  Anaerobic and aerobic cultures from the right hip wound  DISPOSITION OF SPECIMEN:  N/A  COUNTS:  YES  TOURNIQUET:  * No tourniquets in log *  DICTATION: .Dragon Dictation  PLAN OF CARE: Admit to inpatient   PATIENT DISPOSITION:  PACU - hemodynamically stable.   Delay start of Pharmacological VTE agent (>24hrs) due to surgical blood loss or risk of bleeding: yes  The patient was identified in the preop area and the right hip was confirmed as a surgical site and then marked. Chart review was performed and completed  The patient was taken to the operating room where spinal anesthetic was administered without complication. The patient was placed in the lateral decubitus position with an axillary roll and held in position with a hip positioner. The lower extremities were padded.  Betadine was used for prep followed by sterile draping. Timeout was then completed  All were in agreement with this site as right hip  The incision was explored through the right in the skin and it was tracking deep so the incision was opened and extended proximally and  distally. The fascial layer had a small rent and serous fluid was expressed. Cultures were obtained deep to the fascia. We also noted that the abductors had separated from the greater trochanter partially. The cultures were taken from the hip joint.  The wound was therefore irrigated debrided and a 5.0 suture anchor from Arthrex was placed in the trochanter and 2 sutures were passed to repair the greater trochanteric avulsion of the gluteus tendon.  The deep fascia was then closed with #1 Bralon.  The deep fat was closed with #1 Vicryl followed by 2-0 Vicryl to close the subcutaneous tissue. A Hemovac drain was placed in this layer.  The skin was closed with staples.  Postop plan remove drain in 48 hours.  Mobilized bed to chair. Left foot only.

## 2011-05-24 NOTE — Transfer of Care (Signed)
Immediate Anesthesia Transfer of Care Note  Patient: [name]  Procedure(s) Performed: Procedure(s) (LRB): IRRIGATION AND DEBRIDEMENT WOUND (Right) OPEN SURGICAL REPAIR OF GLUTEAL TENDON (Right)  Patient Location: PACU  Anesthesia Type: Spinal  Level of Consciousness: awake and patient cooperative  Airway & Oxygen Therapy: Patient Spontanous Breathing and Patient connected to face mask oxygen  Post-op Assessment: Report given to PACU RN, Post -op Vital signs reviewed and stable and Patient moving all extremities  Post vital signs: Reviewed and stable  Complications: No apparent anesthesia complications

## 2011-05-24 NOTE — Brief Op Note (Signed)
05/22/2011 - 05/24/2011  2:54 PM  PATIENT:  [name]  66 y.o. female  PRE-OPERATIVE DIAGNOSIS:  wound infection right hip status post bipolar  POST-OPERATIVE DIAGNOSIS:  wound dehiscence right hip status post bipolar and gluteus medius partial tear  PROCEDURE:  Procedure(s) (LRB): IRRIGATION AND DEBRIDEMENT WOUND (Right) OPEN SURGICAL REPAIR OF GLUTEAL TENDON (Right)  SURGEON:  Surgeon(s) and Role:    * Fuller Canada, MD - Primary  PHYSICIAN ASSISTANT: Valetta Close  ASSISTANTS:    ANESTHESIA:   spinal  EBL:  Total I/O In: 650 [I.V.:650] Out: 25 [Blood:25]  BLOOD ADMINISTERED:none  DRAINS: drain hemovac   LOCAL MEDICATIONS USED:  NONE  SPECIMEN:  No Specimen  DISPOSITION OF SPECIMEN:  N/A  COUNTS:  YES  TOURNIQUET:  * No tourniquets in log *  DICTATION: .Dragon Dictation  PLAN OF CARE: Admit to inpatient   PATIENT DISPOSITION:  PACU - hemodynamically stable.   Delay start of Pharmacological VTE agent (>24hrs) due to surgical blood loss or risk of bleeding: yes

## 2011-05-24 NOTE — Brief Op Note (Addendum)
05/22/2011 - 05/24/2011  2:48 PM  PATIENT:  [name]  66 y.o. female  PRE-OPERATIVE DIAGNOSIS:  wound infection right hip status post bipolar  POST-OPERATIVE DIAGNOSIS:  wound dehiscence right hip status post bipolar and gluteus medius partial tear  PROCEDURE:  Procedure(s) (LRB): IRRIGATION AND DEBRIDEMENT WOUND (Right) OPEN SURGICAL REPAIR OF GLUTEAL TENDON (Right)  Operative findings serous drainage right hip with fascial rent and partial tear of the gluteus medius tendon.  SURGEON:  Surgeon(s) and Role:    * Fuller Canada, MD - Primary  PHYSICIAN ASSISTANT:   ASSISTANTS: Valetta Close   ANESTHESIA:   spinal  EBL:  Total I/O In: 500 [I.V.:500] Out: 25 [Blood:25]  BLOOD ADMINISTERED:none  DRAINS: One Hemovac drain in the subcutaneous layer   LOCAL MEDICATIONS USED:  NONE  SPECIMEN:  Source of Specimen:  Anaerobic and aerobic cultures from the right hip wound  DISPOSITION OF SPECIMEN:  N/A  COUNTS:  YES  TOURNIQUET:  * No tourniquets in log *  DICTATION: .Dragon Dictation  PLAN OF CARE: Admit to inpatient   PATIENT DISPOSITION:  PACU - hemodynamically stable.   Delay start of Pharmacological VTE agent (>24hrs) due to surgical blood loss or risk of bleeding: yes

## 2011-05-25 LAB — CBC
HCT: 32.2 % — ABNORMAL LOW (ref 36.0–46.0)
Hemoglobin: 10.4 g/dL — ABNORMAL LOW (ref 12.0–15.0)
MCH: 29.3 pg (ref 26.0–34.0)
MCHC: 32.3 g/dL (ref 30.0–36.0)
MCV: 90.7 fL (ref 78.0–100.0)
RBC: 3.55 MIL/uL — ABNORMAL LOW (ref 3.87–5.11)

## 2011-05-25 LAB — BASIC METABOLIC PANEL
BUN: 7 mg/dL (ref 6–23)
CO2: 28 mEq/L (ref 19–32)
Calcium: 8.9 mg/dL (ref 8.4–10.5)
Creatinine, Ser: 0.75 mg/dL (ref 0.50–1.10)
GFR calc non Af Amer: 86 mL/min — ABNORMAL LOW (ref >90)
Glucose, Bld: 100 mg/dL — ABNORMAL HIGH (ref 70–99)

## 2011-05-25 MED ORDER — HYDROCODONE-ACETAMINOPHEN 5-325 MG PO TABS
1.0000 | ORAL_TABLET | ORAL | Status: DC | PRN
  Administered 2011-05-25 – 2011-05-30 (×12): 1 via ORAL
  Filled 2011-05-25 (×10): qty 1

## 2011-05-25 NOTE — Progress Notes (Signed)
NAMEJORDYNE, POEHLMAN               ACCOUNT NO.:  [id number]  MEDICAL RECORD NO.:  [id number]  LOCATION:  A307                          FACILITY:  [facility]  PHYSICIAN:  Trampas Stettner D. Felecia Shelling, MD   DATE OF BIRTH:  [birth year]  DATE OF PROCEDURE:  05/25/2011 DATE OF DISCHARGE:                                PROGRESS NOTE   SUBJECTIVE:  The patient complains some abdominal discomfort.  No diarrhea or vomiting.  She underwent wound debridement and drainage yesterday.  OBJECTIVE:  GENERAL:  The patient is alert, awake, and chronically sick looking. VITAL SIGNS:  Blood pressure 102/55, pulse 51, respiratory rate 12, temperature 98 degrees Fahrenheit. CHEST:  Clear lung fields.  Good air entry. CARDIOVASCULAR SYSTEM:  First and second heart sounds heard.  No murmur. No gallop. ABDOMEN:  Soft and lax.  Bowel sound is positive.  No mass or organomegaly. EXTREMITIES:  The patient has dressed wound on the left hip area with JP drainage tube.  ASSESSMENT: 1. Postoperative wound. 2. History of right hip fracture and status post arthroplasty. 3. Irritable bowel syndrome. 4. Anxiety/depression disorder.  PLAN:  We will continue the patient on current wound care.  Continue IV antibiotics.  We will follow culture results.     Verdia Bolt D. Felecia Shelling, MD     TDF/MEDQ  D:  05/25/2011  T:  05/25/2011  Job:  [id number]

## 2011-05-25 NOTE — Addendum Note (Signed)
Addendum  created 05/25/11 1044 by Glynn Octave, CRNA   Modules edited:Notes Section

## 2011-05-25 NOTE — Progress Notes (Signed)
At Neuro check at 2000, I noticed that the patients left grip is weaker than right. The grip is present, however. Pt is alert and oriented to person and place. Reassment done at 0000, grip is still weaker in left hand. Initially I thought possible pronator drift of left arm, upon reassessment, myself and another nurse determined we did not see a drift. Pupils are equal and reactive. No facial drooping noticed. Also, pt complained of some numb/tingling feeling in her hands since her surgery. Pt says at 0540 that she no longer feels this sensation. Pt is not complaining of any numbness or tingling in her feet, &  pulses are strong. Sheryn Bison

## 2011-05-25 NOTE — Progress Notes (Signed)
Physical Therapy Evaluation Patient Details Name: [name] MRN: [id number] DOB: [birth year] Today's Date: 05/25/2011  Problem List:  Patient Active Problem List  Diagnoses  . CLOSED FRACTURE OF HEAD OF RADIUS  . SUBLUXATION-RADIAL HEAD  . Fall  . Hip fracture, right  . Wound infection after surgery    Past Medical History:  Past Medical History  Diagnosis Date  . IBS (irritable bowel syndrome)   . Depression   . GERD (gastroesophageal reflux disease)   . Gout    Past Surgical History:  Past Surgical History  Procedure Date  . Hip arthroplasty 05/01/2011    Procedure: right ARTHROPLASTY BIPOLAR HIP;  Surgeon: Fuller Canada, MD;  Location: [facility];  Service: Orthopedics;  Laterality: Right;    PT Assessment/Plan/Recommendation PT Assessment Clinical Impression Statement: pt alert and cooperative...has no difficulty performing bed to chair transfer with min assist..holds RLE in hip ABD soe will get ABD pillow for positioning while supine...will ask nursing service to transfer bed to chair over the weekend and will begin gait training with walker, WBAT   R on Mon PT Recommendation/Assessment: Patient will need skilled PT in the acute care venue PT Problem List: Decreased mobility;Decreased strength;Decreased activity tolerance Barriers to Discharge: None PT Plan PT Frequency: Min 3X/week PT Treatment/Interventions: Gait training PT Recommendation Follow Up Recommendations: Skilled nursing facility Equipment Recommended: Defer to next venue PT Goals  Acute Rehab PT Goals PT Goal: Supine/Side to Sit - Progress: Discontinued (comment) Pt will go Sit to Stand: with modified independence PT Goal: Sit to Stand - Progress: Goal set today Pt will go Stand to Sit: with modified independence PT Goal: Stand to Sit - Progress: Goal set today Pt will Transfer Bed to Chair/Chair to Bed: with supervision PT Transfer Goal: Bed to Chair/Chair to Bed - Progress: Goal set today Pt  will Ambulate: 1 - 15 feet;with min assist;with rolling walker PT Goal: Ambulate - Progress: Goal set today  PT Evaluation Precautions/Restrictions  Precautions Precaution Comments: not to begin gait until 05-28-11 Restrictions Weight Bearing Restrictions: No RLE Weight Bearing: Weight bearing as tolerated Prior Functioning  Home Living Type of Home: Skilled Nursing Facility   Cognition Cognition Arousal/Alertness: Awake/alert Overall Cognitive Status: Appears within functional limits for tasks assessed Orientation Level: Oriented X4 Sensation/Coordination Sensation Light Touch: Appears Intact Stereognosis: Not tested Hot/Cold: Not tested Proprioception: Appears Intact Extremity Assessment RLE Assessment RLE Assessment: Exceptions to [facility] (when supine, RLE is ADDUCTED-will get hip ABD pillow) RLE PROM (degrees) Right Hip ABduction 0-45: 15  LLE Assessment LLE Assessment: Within Functional Limits Mobility (including Balance) Bed Mobility Supine to Sit: 5: Supervision Sitting - Scoot to Edge of Bed: 6: Modified independent (Device/Increase time) Sit to Supine: 6: Modified independent (Device/Increase time) Transfers Sit to Stand: 4: Min assist Sit to Stand Details (indicate cue type and reason): pt is WBAT R, instructed to move toward LLE as much as possible Stand to Sit: 5: Supervision Stand Pivot Transfers: 4: Min assist Ambulation/Gait Ambulation/Gait: No (not to begin until 05-28-11) Stairs: No Wheelchair Mobility Wheelchair Mobility: No    Exercise  General Exercises - Lower Extremity Ankle Circles/Pumps: AROM;Both;10 reps;Supine Quad Sets: AROM;Both;10 reps;Supine Gluteal Sets: AROM;Both;10 reps;Supine End of Session PT - End of Session Equipment Utilized During Treatment: Gait belt Activity Tolerance: Patient tolerated treatment well Patient left: in chair;with call bell in reach;with bed alarm set Nurse Communication: Mobility status for transfers (need for  abd pillow when supine) General Behavior During Session: [facility] for tasks performed Cognition: [facility]  for tasks performed  Konrad Penta 05/25/2011, 11:16 AM

## 2011-05-25 NOTE — Progress Notes (Deleted)
Checked patients O2 saturation on room air-86% drop at rest. O2 reapplied on 2L [place]-patients O2 increased to 96%.

## 2011-05-26 LAB — CBC
MCH: 29.4 pg (ref 26.0–34.0)
MCHC: 31.6 g/dL (ref 30.0–36.0)
MCV: 92.8 fL (ref 78.0–100.0)
Platelets: 354 10*3/uL (ref 150–400)
RBC: 3.61 MIL/uL — ABNORMAL LOW (ref 3.87–5.11)

## 2011-05-26 NOTE — Plan of Care (Signed)
Problem: Phase III Progression Outcomes Goal: Foley discontinued Outcome: Not Applicable Date Met:  05/26/11 Pt is standing to BSC.

## 2011-05-26 NOTE — Progress Notes (Signed)
Subjective: 2 Days Post-Op Procedure(s) (LRB): IRRIGATION AND DEBRIDEMENT WOUND (Right) OPEN SURGICAL REPAIR OF GLUTEAL TENDON (Right) Patient reports pain as less.    Objective: Vital signs in last 24 hours: Temp:  [97.9 F (36.6 C)-98.4 F (36.9 C)] 97.9 F (36.6 C) (03/02 0643) Pulse Rate:  [55-68] 68  (03/02 0643) Resp:  [18] 18  (03/02 0643) BP: (104-118)/(65-69) 107/69 mmHg (03/02 0643) SpO2:  [95 %-97 %] 97 % (03/02 0643)  Intake/Output from previous day: 03/01 0701 - 03/02 0700 In: 3046 [P.O.:640; I.V.:2400; IV Piggyback:6] Out: 370 [Urine:300; Drains:70] Intake/Output this shift:     Basename 05/26/11 0535 05/25/11 0518  HGB 10.6* 10.4*    Basename 05/26/11 0535 05/25/11 0518  WBC 6.6 6.6  RBC 3.61* 3.55*  HCT 33.5* 32.2*  PLT 354 370    Basename 05/25/11 0518  NA 144  K 3.5  CL 110  CO2 28  BUN 7  CREATININE 0.75  GLUCOSE 100*  CALCIUM 8.9   No results found for this basename: LABPT:2,INR:2 in the last 72 hours  Neurologically intact Neurovascular intact Sensation intact distally Intact pulses distally Incision: scant drainage  Assessment/Plan: 2 Days Post-Op Procedure(s) (LRB): IRRIGATION AND DEBRIDEMENT WOUND (Right) OPEN SURGICAL REPAIR OF GLUTEAL TENDON (Right) CULTURE NEGATIVE   STRONGLY SUSPECT THIS WA SNOT AN INFECTION BUT SEROUS DRAINAGE FROM PARTIAL GLUTEAL DISRUPTION, CX NGSF   Fuller Canada 05/26/2011, 9:19 AM

## 2011-05-26 NOTE — Progress Notes (Signed)
[name], [name]               ACCOUNT NO.:  [id number]  MEDICAL RECORD NO.:  [id number]  LOCATION:                                 FACILITY:  PHYSICIAN:  Janette Harvie D. Felecia Shelling, MD   DATE OF BIRTH:  10/16/1945  DATE OF PROCEDURE:  05/26/2011 DATE OF DISCHARGE:                                PROGRESS NOTE   SUBJECTIVE:  The patient is feeling slightly better, continued to have some pain and discomfort in right hip area.  No fever or chills.  OBJECTIVE:  GENERAL:  The patient is alert, awake, anxious looking, and chronically sick. VITAL SIGNS:  Blood pressure 118/65, pulse 68, respiratory rate 18, temperature 98 degrees Fahrenheit. CHEST:  Decreased air entry, few rhonchi. CARDIOVASCULAR:  First and second heart sounds heard.  No murmur.  No gallop. ABDOMEN:  Soft and lax.  Bowel sound is positive.  No mass or organomegaly. EXTREMITIES:  The patient has dressed wound on right hip area with a JP drainage tube. Extremities, no leg edema.  ASSESSMENT: 1. Infected postop right hip wound. 2. Status post right hip replacement. 3. Anxiety, depression disorder. 4. History of irritable bowel syndrome. 5. Anemia.  PLAN:  We will continue the patient on IV antibiotics.  Continue wound care, and we will continue current treatment.     Zanai Mallari D. Felecia Shelling, MD     TDF/MEDQ  D:  05/26/2011  T:  05/26/2011  Job:  [id number]

## 2011-05-27 LAB — WOUND CULTURE

## 2011-05-27 LAB — CBC
HCT: 31.2 % — ABNORMAL LOW (ref 36.0–46.0)
MCHC: 31.1 g/dL (ref 30.0–36.0)
MCV: 92.9 fL (ref 78.0–100.0)
Platelets: 306 10*3/uL (ref 150–400)
RDW: 14.3 % (ref 11.5–15.5)
WBC: 5.5 10*3/uL (ref 4.0–10.5)

## 2011-05-27 MED ORDER — VANCOMYCIN HCL IN DEXTROSE 1-5 GM/200ML-% IV SOLN
1000.0000 mg | Freq: Two times a day (BID) | INTRAVENOUS | Status: DC
  Administered 2011-05-27 – 2011-05-30 (×6): 1000 mg via INTRAVENOUS
  Filled 2011-05-27 (×8): qty 200

## 2011-05-27 MED ORDER — LOPERAMIDE HCL 2 MG PO CAPS
4.0000 mg | ORAL_CAPSULE | Freq: Four times a day (QID) | ORAL | Status: DC
  Administered 2011-05-27 – 2011-05-30 (×12): 4 mg via ORAL
  Filled 2011-05-27 (×7): qty 2
  Filled 2011-05-27: qty 4
  Filled 2011-05-27 (×3): qty 2

## 2011-05-27 NOTE — Plan of Care (Signed)
Problem: Phase III Progression Outcomes Goal: Pain controlled on oral analgesia Outcome: Progressing On night shift, pt initially complained of right hip pain and headache. Pt allowed to rest, when I returned to the room, I found the pt resting well. Therefore, the pt still complains of pain at times, but rests very well.

## 2011-05-27 NOTE — Progress Notes (Signed)
Subjective: 3 Days Post-Op Procedure(s) (LRB): IRRIGATION AND DEBRIDEMENT WOUND (Right) OPEN SURGICAL REPAIR OF GLUTEAL TENDON (Right) Patient reports pain as mild.   C/o diarrhea Objective: Vital signs in last 24 hours: Temp:  [98.1 F (36.7 C)-98.8 F (37.1 C)] 98.1 F (36.7 C) (03/03 0523) Pulse Rate:  [60-76] 64  (03/03 0523) Resp:  [16-20] 16  (03/03 0523) BP: (102-119)/(56-72) 119/72 mmHg (03/03 0523) SpO2:  [93 %-98 %] 93 % (03/03 0523)  Intake/Output from previous day: 03/02 0701 - 03/03 0700 In: 1400 [P.O.:600; I.V.:800] Out: 680 [Urine:680] Intake/Output this shift: Total I/O In: 1860 [P.O.:360; I.V.:1500] Out: 300 [Urine:300]   Basename 05/27/11 0610 05/26/11 0535 05/25/11 0518  HGB 9.7* 10.6* 10.4*    Basename 05/27/11 0610 05/26/11 0535  WBC 5.5 6.6  RBC 3.36* 3.61*  HCT 31.2* 33.5*  PLT 306 354    Basename 05/25/11 0518  NA 144  K 3.5  CL 110  CO2 28  BUN 7  CREATININE 0.75  GLUCOSE 100*  CALCIUM 8.9   STAPHYLOCOCCUS AUREUS       Antibiotic  Sensitivity  Microscan  Status    CLINDAMYCIN  Resistant  >=8  Final    Method:  MIC    ERYTHROMYCIN  Resistant  >=8  Final    Method:  MIC    GENTAMICIN  Sensitive  <=0.5  Final    Method:  MIC    LEVOFLOXACIN  Resistant  >=8  Final    Method:  MIC    MOXIFLOXACIN  Resistant  2  Final    Method:  MIC    OXACILLIN  Sensitive  0.5  Final    Method:  MIC    PENICILLIN  Resistant  >=0.5  Final    Method:  MIC    RIFAMPIN  Sensitive  <=0.5  Final    Method:  MIC    TETRACYCLINE  Sensitive  <=1  Final    Method:  MIC    TRIMETH/SULFA  Sensitive  <=10  Final    Method:  MIC    VANCOMYCIN  Sensitive  1  Final    Method:  MIC     Comments  STAPHYLOCOCCUS AUREUS (MIC)      RARE STAPHYLOCOCCUS AUREUS      No results found for this basename: LABPT:2,INR:2 in the last 72 hours  Incision: moderate drainage  Assessment/Plan: 3 Days Post-Op Procedure(s) (LRB): IRRIGATION AND DEBRIDEMENT WOUND  (Right) OPEN SURGICAL REPAIR OF GLUTEAL TENDON (Right) Advance diet Treat diarrhea picc line for 2 weeks IV vanc   Suspect gluteal disruption led to serous drainage which led to 2nd ary infection in SNF environment     [employer] 05/27/2011, 9:07 AM

## 2011-05-28 ENCOUNTER — Encounter (HOSPITAL_COMMUNITY): Payer: Medicare Other

## 2011-05-28 ENCOUNTER — Inpatient Hospital Stay (HOSPITAL_COMMUNITY): Payer: Medicare Other

## 2011-05-28 ENCOUNTER — Encounter (HOSPITAL_COMMUNITY): Payer: Self-pay | Admitting: Radiology

## 2011-05-28 MED ORDER — SODIUM CHLORIDE 0.9 % IJ SOLN
10.0000 mL | Freq: Two times a day (BID) | INTRAMUSCULAR | Status: DC
  Administered 2011-05-28: 10 mL
  Administered 2011-05-28: 20 mL
  Administered 2011-05-29 – 2011-05-30 (×3): 10 mL
  Filled 2011-05-28 (×4): qty 3

## 2011-05-28 MED ORDER — SODIUM CHLORIDE 0.9 % IJ SOLN
10.0000 mL | INTRAMUSCULAR | Status: DC | PRN
  Filled 2011-05-28: qty 3

## 2011-05-28 MED ORDER — ROCURONIUM BROMIDE 50 MG/5ML IV SOLN
INTRAVENOUS | Status: AC
  Filled 2011-05-28: qty 1

## 2011-05-28 NOTE — Progress Notes (Signed)
Physical Therapy Treatment Patient Details Name: AUNNA SNOOKS MRN: [id number] DOB: 12-31-1945 Today's Date: 05/28/2011  TIME:1201-1223/12 mins TE-77mins GT  PT Assessment/Plan  PT - Assessment/Plan Comments on Treatment Session: Pt had c/o of nausea and was limited in gait distance secondary to this. Pt was able to amb very slowly from bed to chair 8' with RW Min A. Pt performed bed exercise with good mechanics and little asistance PT Goals  Acute Rehab PT Goals PT Goal: Supine/Side to Sit - Progress: Met PT Goal: Sit to Stand - Progress: Progressing toward goal PT Goal: Stand to Sit - Progress: Progressing toward goal PT Goal: Ambulate - Progress: Progressing toward goal  PT Treatment Precautions/Restrictions  Precautions Precaution Comments: not to begin gait until 05-28-11 Restrictions Weight Bearing Restrictions: No RLE Weight Bearing: Weight bearing as tolerated Mobility (including Balance) Bed Mobility Supine to Sit: 4: Min assist Supine to Sit Details (indicate cue type and reason): assistance with trunk and slight assistance with RLE Sitting - Scoot to Edge of Bed: 6: Modified independent (Device/Increase time) Transfers Transfers: Yes Sit to Stand: 5: Supervision Stand to Sit: 5: Supervision Ambulation/Gait Ambulation/Gait: Yes Ambulation/Gait Assistance: 4: Min assist Ambulation/Gait Assistance Details (indicate cue type and reason): verbal cues for sequenceing of RW and use of UE to redistribute weight as needed from RLE Ambulation Distance (Feet): 8 Feet (Bed<>chair) Assistive device: Rolling walker Gait Pattern: Step-to pattern;Decreased stance time - right;Trunk flexed Gait velocity: extremely slow, with standing break between each 2 step Stairs: No Wheelchair Mobility Wheelchair Mobility: No    Exercise  General Exercises - Lower Extremity Ankle Circles/Pumps: Both;10 reps Quad Sets: Both;15 reps Gluteal Sets: 10 reps Short Arc Quad: Both;10 reps Heel  Slides: Both;10 reps End of Session PT - End of Session Equipment Utilized During Treatment: Gait belt Activity Tolerance: Treatment limited secondary to medical complications (Comment);Other (comment) (patient nauseous) Patient left: in chair;with call bell in reach (chair alarm set;PICC line flushed, nursing wants pt upright ) General Behavior During Session: [facility] for tasks performed Cognition: [facility] for tasks performed  Tameaka Eichhorn ATKINSO 05/28/2011, 12:40 PM

## 2011-05-28 NOTE — Progress Notes (Signed)
Subjective: 4 Days Post-Op Procedure(s) (LRB): IRRIGATION AND DEBRIDEMENT WOUND (Right) OPEN SURGICAL REPAIR OF GLUTEAL TENDON (Right) Patient reports pain as mild.    Objective: Vital signs in last 24 hours: Temp:  [98.1 F (36.7 C)-98.7 F (37.1 C)] 98.5 F (36.9 C) (03/04 0600) Pulse Rate:  [55-101] 101  (03/04 0600) Resp:  [18-20] 20  (03/04 0600) BP: (125-145)/(71-85) 125/71 mmHg (03/04 0600) SpO2:  [92 %-96 %] 92 % (03/04 0600)  Intake/Output from previous day: 03/03 0701 - 03/04 0700 In: 2340 [P.O.:840; I.V.:1500] Out: 650 [Urine:650] Intake/Output this shift: Total I/O In: 220 [P.O.:220] Out: 200 [Urine:200]   Basename 05/27/11 0610 05/26/11 0535  HGB 9.7* 10.6*    Basename 05/27/11 0610 05/26/11 0535  WBC 5.5 6.6  RBC 3.36* 3.61*  HCT 31.2* 33.5*  PLT 306 354   No results found for this basename: NA:2,K:2,CL:2,CO2:2,BUN:2,CREATININE:2,GLUCOSE:2,CALCIUM:2 in the last 72 hours No results found for this basename: LABPT:2,INR:2 in the last 72 hours  Incision: scant drainage  Assessment/Plan: 4 Days Post-Op Procedure(s) (LRB): IRRIGATION AND DEBRIDEMENT WOUND (Right) OPEN SURGICAL REPAIR OF GLUTEAL TENDON (Right) Up with therapy PICC LINE  SNF AFTER PICC AND WHEN BED AVAILABLE   Fuller Canada 05/28/2011, 10:00 AM

## 2011-05-28 NOTE — Progress Notes (Signed)
[name], [name]               ACCOUNT NO.:  [id number]  MEDICAL RECORD NO.:  [id number]  LOCATION:  A307                          FACILITY:  [facility]  PHYSICIAN:  Geralynn Capri D. Felecia Shelling, MD   DATE OF BIRTH:  February 15, 1946  DATE OF PROCEDURE:  05/28/2011 DATE OF DISCHARGE:                                PROGRESS NOTE   SUBJECTIVE:  The patient is resting.  Continued to complain of pain on her right hip area.  Her JP drain tube has been removed.  OBJECTIVE:  GENERAL:  The patient is alert, awake and resting. VITALS:  Blood pressure 125/71, pulse 55 respiratory rate 18, temperature 98 degrees Fahrenheit. CHEST:  Clear lung fields.  Good air entry. CARDIOVASCULAR SYSTEM:  First and second heart sounds heard.  No murmur. No gallop.  ABDOMEN:  Soft and lax.  Bowel sound is positive.  No mass, no organomegaly. EXTREMITIES:  No leg edema.  The patient has dressed wound in her right hip area.  ASSESSMENT: 1. Infected right hip wound. 2. Status post hip replacement. 3. Anemia. 4. Anxiety depression disorder. 5. History of irritable bowel syndrome.  PLAN:  We will continue the patient on wound care.  Continue current antibiotics and supportive care.     Caroljean Monsivais D. Felecia Shelling, MD     TDF/MEDQ  D:  05/28/2011  T:  05/28/2011  Job:  [id number]

## 2011-05-28 NOTE — Progress Notes (Signed)
NAMEDAMARIZ, PAGANELLI               ACCOUNT NO.:  [id number]  MEDICAL RECORD NO.:  [id number]  LOCATION:  A307                          FACILITY:  [facility]  PHYSICIAN:  Avalina Benko D. Felecia Shelling, MD   DATE OF BIRTH:  1945/12/14  DATE OF PROCEDURE:  05/27/2011 DATE OF DISCHARGE:                                PROGRESS NOTE   SUBJECTIVE:  The patient is still complaining of pain and discomfort on the right hip area.  The patient has discharge.  No fever or chills.  OBJECTIVE:  GENERAL:  The patient is alert, awake, and sick looking. VITAL SIGNS:  Blood pressure 126/72, pulse 86, respiratory rate 16, temperature 98 degrees Fahrenheit. CHEST:  Clear lung field.  Good air entry. CARDIOVASCULAR SYSTEM:  First and second heart sound heard.  No murmur. No gallop. ABDOMEN:  Soft and lax.  Bowel sound is positive.  No mass or organomegaly.  EXTREMITIES:  2+ edema.  ASSESSMENT: 1. Infected right hip wound. 2. Status post hip replacement. 3. Depression disorder. 4. Irritable bowel syndrome. 5. Anemia.  PLAN:  Continue the patient on IV antibiotics.  Continue wound care and current treatment.     Ehtan Delfavero D. Felecia Shelling, MD     TDF/MEDQ  D:  05/27/2011  T:  05/28/2011  Job:  [id number]

## 2011-05-29 ENCOUNTER — Encounter (HOSPITAL_COMMUNITY): Payer: Self-pay | Admitting: Orthopedic Surgery

## 2011-05-29 ENCOUNTER — Telehealth: Payer: Self-pay | Admitting: Orthopedic Surgery

## 2011-05-29 LAB — ANAEROBIC CULTURE

## 2011-05-29 NOTE — Progress Notes (Signed)
[name], [name]               ACCOUNT NO.:  [id number]  MEDICAL RECORD NO.:  [id number]  LOCATION:  A307                          FACILITY:  [facility]  PHYSICIAN:  Phyllis Abelson D. Felecia Shelling, MD   DATE OF BIRTH:  Oct 03, 1945  DATE OF PROCEDURE:  05/28/2011 DATE OF DISCHARGE:                                PROGRESS NOTE   SUBJECTIVE:  The patient continued to complain of pain on her right hip area.  She is receiving wound care.  The patient is planned to be discharged to nursing home when bed is available.  OBJECTIVE:  GENERAL:  The patient is alert, awake, and sick looking. VITAL SIGNS:  Blood pressure 96/57, pulse 50, respiratory rate 18, temperature 98.2 degrees Fahrenheit. CHEST:  Decreased air entry, few rhonchi. CARDIOVASCULAR SYSTEM:  First and second heart sound heard.  No murmur. No gallop. ABDOMEN:  Soft and lax.  Bowel sound is positive.  No mass, no organomegaly. EXTREMITIES:  No leg edema.  The patient has dressed wound on the right hip.  ASSESSMENT: 1. Right hip open wound. 2. History of right hip arthroplasty. 3. History of right hip fracture. 4. Anemia postoperative. 5. Anxiety disorder.  PLAN:  Continue the patient on wound care.  The patient will be transferred to skilled nursing home when bed is available.     Myleah Cavendish D. Felecia Shelling, MD     TDF/MEDQ  D:  05/29/2011  T:  05/29/2011  Job:  [id number]

## 2011-05-29 NOTE — Progress Notes (Signed)
Attempting to reach MD for d/c summary and orders. [facility] states they need the Med List no later than 4:30 pm.  Reece Levy, MSW, Theresia Majors [phone]

## 2011-05-29 NOTE — Progress Notes (Signed)
Subjective: 5 Days Post-Op Procedure(s) (LRB): IRRIGATION AND DEBRIDEMENT WOUND (Right) OPEN SURGICAL REPAIR OF GLUTEAL TENDON (Right) Patient reports pain as mild.    Objective: Vital signs in last 24 hours: Temp:  [98.2 F (36.8 C)-98.4 F (36.9 C)] 98.2 F (36.8 C) (03/05 0448) Pulse Rate:  [50-74] 74  (03/05 0448) Resp:  [18] 18  (03/05 0448) BP: (96-128)/(57-77) 128/75 mmHg (03/05 0448) SpO2:  [91 %-96 %] 91 % (03/05 0448)  Intake/Output from previous day: 03/04 0701 - 03/05 0700 In: 1910 [P.O.:940; I.V.:170; IV Piggyback:800] Out: 1000 [Urine:1000] Intake/Output this shift:     Basename 05/27/11 0610  HGB 9.7*    Basename 05/27/11 0610  WBC 5.5  RBC 3.36*  HCT 31.2*  PLT 306   No results found for this basename: NA:2,K:2,CL:2,CO2:2,BUN:2,CREATININE:2,GLUCOSE:2,CALCIUM:2 in the last 72 hours No results found for this basename: LABPT:2,INR:2 in the last 72 hours  Neurologically intact Incision: scant drainage  Assessment/Plan: 5 Days Post-Op Procedure(s) (LRB): IRRIGATION AND DEBRIDEMENT WOUND (Right) OPEN SURGICAL REPAIR OF GLUTEAL TENDON (Right) Discharge to SNF when bed available   [name] 05/29/2011, 7:37 AM

## 2011-05-29 NOTE — Progress Notes (Signed)
Contacted MD to advise patient is from [facility] and can return at d/c. Reece Levy, MSW, Theresia Majors [phone]

## 2011-05-29 NOTE — Consult Note (Signed)
ANTIBIOTIC CONSULT NOTE   Pharmacy Consult for Vancomycin Indication: infected wound  Allergies  Allergen Reactions  . Diphenhydramine Other (See Comments)    Feels bad   Patient Measurements: Height: 5\' 7"  (170.2 cm) Weight: 145 lb 3.2 oz (65.862 kg) IBW/kg (Calculated) : 61.6   Vital Signs: Temp: 98.2 F (36.8 C) (03/05 0448) Temp src: Oral (03/05 0448) BP: 128/75 mmHg (03/05 0448) Pulse Rate: 74  (03/05 0448) Intake/Output from previous day: 03/04 0701 - 03/05 0700 In: 1910 [P.O.:940; I.V.:170; IV Piggyback:800] Out: 1000 [Urine:1000] Intake/Output from this shift: Total I/O In: 26 [I.V.:26] Out: -   Labs:  Basename 05/27/11 0610  WBC 5.5  HGB 9.7*  PLT 306  LABCREA --  CREATININE --   Estimated Creatinine Clearance: 67.3 ml/min (by C-G formula based on Cr of 0.75). No results found for this basename: VANCOTROUGH:2,VANCOPEAK:2,VANCORANDOM:2,GENTTROUGH:2,GENTPEAK:2,GENTRANDOM:2,TOBRATROUGH:2,TOBRAPEAK:2,TOBRARND:2,AMIKACINPEAK:2,AMIKACINTROU:2,AMIKACIN:2, in the last 72 hours   Microbiology: Recent Results (from the past 720 hour(s))  MRSA PCR SCREENING     Status: Normal   Collection Time   05/22/11 12:48 PM      Component Value Range Status Comment   MRSA by PCR NEGATIVE  NEGATIVE  Final   SURGICAL PCR SCREEN     Status: Normal   Collection Time   05/24/11 12:30 PM      Component Value Range Status Comment   MRSA, PCR NEGATIVE  NEGATIVE  Final    Staphylococcus aureus NEGATIVE  NEGATIVE  Final   ANAEROBIC CULTURE     Status: Normal (Preliminary result)   Collection Time   05/24/11  2:00 PM      Component Value Range Status Comment   Specimen Description WOUND RIGHT HIP   Final    Special Requests VANCOMYCIN   Final    Gram Stain     Final    Value: RARE WBC PRESENT, PREDOMINANTLY PMN     NO SQUAMOUS EPITHELIAL CELLS SEEN     NO ORGANISMS SEEN   Culture     Final    Value: NO ANAEROBES ISOLATED; CULTURE IN PROGRESS FOR 5 DAYS   Report Status PENDING    Incomplete   WOUND CULTURE     Status: Normal   Collection Time   05/24/11  2:00 PM      Component Value Range Status Comment   Specimen Description WOUND RIGHT HIP   Final    Special Requests VANCOMYCIN   Final    Gram Stain     Final    Value: RARE WBC PRESENT, PREDOMINANTLY PMN     NO SQUAMOUS EPITHELIAL CELLS SEEN     NO ORGANISMS SEEN   Culture     Final    Value: RARE STAPHYLOCOCCUS AUREUS     Note: RIFAMPIN AND GENTAMICIN SHOULD NOT BE USED AS SINGLE DRUGS FOR TREATMENT OF STAPH INFECTIONS.   Report Status 05/27/2011 FINAL   Final    Organism ID, Bacteria STAPHYLOCOCCUS AUREUS   Final    Medical History: Past Medical History  Diagnosis Date  . IBS (irritable bowel syndrome)   . Depression   . GERD (gastroesophageal reflux disease)   . Gout    Medications:  Scheduled:    . ALPRAZolam  0.5 mg Oral TID  . enoxaparin  40 mg Subcutaneous Q24H  . loperamide  4 mg Oral Q6H  . OLANZapine  5 mg Oral QHS  . rocuronium      . sodium chloride  10-40 mL Intracatheter Q12H  . vancomycin  1,000 mg  Intravenous Q12H   Assessment: Renal fxn OK, ClCr > 60  Goal of Therapy:  Vancomycin trough level 10-15 mcg/ml  Plan: F/U level tomorrow, adjust dose as needed  [name], [name] A 05/29/2011,10:17 AM

## 2011-05-29 NOTE — Telephone Encounter (Signed)
Form received in fax, signed by Dr. Romeo Apple, and returned via fax to attention Marylu Lund, Child psychotherapist.

## 2011-05-29 NOTE — Telephone Encounter (Signed)
[name], Child psychotherapist at [facility], direct ph# [phone], called to relay that patient is "already a resident at [facility]" therefore can be discharged today; states orders say that she can be discharged when bed available. She states she is faxing FL2 form for signature.   Please advise.

## 2011-05-30 ENCOUNTER — Inpatient Hospital Stay
Admission: RE | Admit: 2011-05-30 | Discharge: 2011-06-29 | Disposition: A | Discharge status: Rehabilitation Facility | Payer: Medicaid Other | Source: Ambulatory Visit | Attending: Internal Medicine | Admitting: Internal Medicine

## 2011-05-30 LAB — VANCOMYCIN, RANDOM: Vancomycin Rm: 19.6 ug/mL

## 2011-05-30 MED ORDER — SODIUM CHLORIDE 0.9 % IJ SOLN
INTRAMUSCULAR | Status: AC
  Filled 2011-05-30: qty 3

## 2011-05-30 MED ORDER — ALPRAZOLAM 0.5 MG PO TABS: 0.5000 mg | ORAL_TABLET | Freq: Three times a day (TID) | ORAL | Status: DC

## 2011-05-30 MED ORDER — OLANZAPINE 5 MG PO TABS: 5.0000 mg | ORAL_TABLET | Freq: Every day | ORAL | Status: DC

## 2011-05-30 MED ORDER — VANCOMYCIN HCL IN DEXTROSE 1-5 GM/200ML-% IV SOLN: 1500.0000 mg | INTRAVENOUS | Status: DC

## 2011-05-30 MED ORDER — METOCLOPRAMIDE HCL 5 MG PO TABS: 5.0000 mg | ORAL_TABLET | Freq: Three times a day (TID) | ORAL | Status: DC | PRN

## 2011-05-30 MED ORDER — HYDROCODONE-ACETAMINOPHEN 5-325 MG PO TABS: 1.0000 | ORAL_TABLET | ORAL | Status: AC | PRN

## 2011-05-30 NOTE — Progress Notes (Signed)
Patient for d/c today to SNF bed at [facility]- Contacted patient/s Guardian of plans- she is agreeable to these plans. Reece Levy, MSW, Theresia Majors [phone]

## 2011-05-30 NOTE — Progress Notes (Signed)
[name], [name]               ACCOUNT NO.:  [id number]  MEDICAL RECORD NO.:  [id number]  LOCATION:  A307                          FACILITY:  [facility]  PHYSICIAN:  Felise Georgia D. Felecia Shelling, MD   DATE OF BIRTH:  01-02-1946  DATE OF PROCEDURE:  05/30/2011 DATE OF DISCHARGE:                                PROGRESS NOTE   SUBJECTIVE:  The patient feels better.  She is receiving wound care. The patient is waiting for placement in nursing home.  She received on IV vancomycin.  No new complaints.  OBJECTIVE:  GENERAL:  The patient is alert, awake, and chronically sick looking. VITAL SIGNS:  Blood pressure 142/81, pulse 72, respiratory rate 20, temperature 98.6 degrees Fahrenheit. CHEST:  Decreased air entry, few rhonchi. CARDIOVASCULAR SYSTEM:  First and second heart sounds heard.  No murmur. No gallop. ABDOMEN:  Soft, and lax.  Bowel sound is positive.  No mass or organomegaly. EXTREMITIES:  No leg edema.  The patient has dressed wound on the right hip area.  ASSESSMENT: 1. Right hip postoperative infected wound. 2. Status post hip arthroplasty. 3. Anemia. 4. Anxiety disorder.  PLAN:  Continue the patient on wound care.  Continue IV antibiotics. The patient is waiting for placement in nursing home.     Jeremiyah Cullens D. Felecia Shelling, MD     TDF/MEDQ  D:  05/30/2011  T:  05/30/2011  Job:  [id number]

## 2011-05-30 NOTE — Progress Notes (Addendum)
Patient d/c to [facility], report called to Robin nurse Patient discharging with single lumen PICC line to right upper arm, flushed and saline locked this am Patient transported by staff via wheelchair  No c/o pain at d/c D/c packet sent with patient Patient's guardian Chastity made aware of the transfer Joas Motton, Kae Heller

## 2011-05-30 NOTE — Discharge Summary (Signed)
Physician Discharge Summary  Patient ID: [name] MRN: [id number] DOB/AGE: 1945/07/03 66 y.o.  Admit date: 05/22/2011 Discharge date: 05/30/2011  Admission Diagnoses: wound infection right hip  Discharge Diagnoses: wound infection and gluteus medius rupture  Active Problems:  * No active hospital problems. *    Discharged Condition: stable  Hospital Course:  The patient presented to the office with a draining wound after bipolar hip replacement on the right. She was admitted to the hospital had surgery on February 26th. At the time of surgery she was found to have gluteus medius disruption, a large amount of serous drainage. No chronic tissue was found. The drainage was purulent or superficial.  The patient had repair of the gluteus medius tendon irrigation and debridement of the wound was started on IV vancomycin on Sunday March 3rd.   She had unremarkable hospital course. Consults: Dr Felecia Shelling   Significant Diagnostic Studies: microbiology: wound culture: positive for oxacillin sensitive staph aureus. Sensitive to vancomycin and tetracycline.  Treatments: antibiotics: vancomycin and surgery: Incision and drainage right hip repair of gluteus medius tendon  Discharge Exam: Blood pressure 99/59, pulse 50, temperature 97.6 F (36.4 C), temperature source Axillary, resp. rate 18, height 5\' 7"  (1.702 m), weight 65.862 kg (145 lb 3.2 oz), SpO2 92.00%. General appearance: alert, cooperative, appears stated age and no distress Scant wound drainage   Disposition: Intermediate Care Facility  Discharge Orders    Future Appointments: Provider: Department: Dept Phone: Center:   06/05/2011 10:15 AM Fuller Canada, MD [facility] [phone] [facility]     Future Orders Please Complete By Expires   BUN      Comments:   Friday 3/8   Creatinine      Comments:   Collect 3/8   Vancomycin, trough      Comments:   3/8 collect   Diet - low sodium heart healthy      Call MD / Call  911      Comments:   If you experience chest pain or shortness of breath, CALL 911 and be transported to the hospital emergency room.  If you develope a fever above 101 F, pus (white drainage) or increased drainage or redness at the wound, or calf pain, call your surgeon's office.   Constipation Prevention      Comments:   Drink plenty of fluids.  Prune juice may be helpful.  You may use a stool softener, such as Colace (over the counter) 100 mg twice a day.  Use MiraLax (over the counter) for constipation as needed.   Increase activity slowly as tolerated      Weight Bearing as taught in Physical Therapy      Comments:   Use a walker or crutches as instructed. Weight bearing as tolertated   Discharge instructions      Comments:   Staples remove on March 11 apply steristrips   Change dressing      Comments:   Change daily as needed   Follow the hip precautions as taught in Physical Therapy      Scheduling Instructions:   Precautions are for direct lateral approach  No active abduction x 6 weeks     Medication List  As of 05/30/2011  7:52 AM   STOP taking these medications         acetaminophen 650 MG CR tablet      bisacodyl 5 MG EC tablet         TAKE these medications  ALPRAZolam 0.5 MG tablet   Commonly known as: XANAX   Take 1 tablet (0.5 mg total) by mouth 3 (three) times daily.      dicyclomine 10 MG capsule   Commonly known as: BENTYL   Take 1 capsule (10 mg total) by mouth 4 (four) times daily -  before meals and at bedtime.      enoxaparin 30 MG/0.3ML Soln   Commonly known as: LOVENOX   Inject 0.3 mLs (30 mg total) into the skin daily.      fesoterodine 4 MG Tb24   Commonly known as: TOVIAZ   Take 1 tablet (4 mg total) by mouth daily.      FLUoxetine 20 MG capsule   Commonly known as: PROZAC   Take 60 mg by mouth daily.      HYDROcodone-acetaminophen 5-325 MG per tablet   Commonly known as: NORCO   Take 1 tablet by mouth every 4 (four) hours as  needed.      loperamide 2 MG capsule   Commonly known as: IMODIUM   Take 2-4 mg by mouth 4 (four) times daily as needed. For diarrhea, take 2 tablets (4mg ) initially then take 2 mg after each loose stool      metoCLOPramide 5 MG tablet   Commonly known as: REGLAN   Take 1-2 tablets (5-10 mg total) by mouth every 8 (eight) hours as needed (if ondansetron (ZOFRAN) ineffective.).      OLANZapine 5 MG tablet   Commonly known as: ZYPREXA   Take 1 tablet (5 mg total) by mouth at bedtime.      omeprazole 20 MG capsule   Commonly known as: PRILOSEC   Take 20 mg by mouth daily.      simvastatin 20 MG tablet   Commonly known as: ZOCOR   Take 20 mg by mouth every evening.      vancomycin 1 GM/200ML Soln   Commonly known as: VANCOCIN   Inject 300 mLs (1,500 mg total) into the vein daily.           Follow-up Information    Follow up with Fuller Canada, MD on 06/04/2011. (check wound )    Contact information:   [address], [address] [place] [address] [phone]         Directions the staples will come out March 11 apply Steri-Strips  Weightbearing status as tolerated  Hip precautions direct lateral approach  No active abduction for 6 weeks  Continue vancomycin for a total of 11 days, stop date March 17  Return to Dr. Romeo Apple on March 11  Signed: Fuller Canada 05/30/2011, 7:52 AM

## 2011-06-05 ENCOUNTER — Ambulatory Visit (INDEPENDENT_AMBULATORY_CARE_PROVIDER_SITE_OTHER): Payer: Medicare Other | Admitting: Orthopedic Surgery

## 2011-06-05 ENCOUNTER — Encounter: Payer: Self-pay | Admitting: Orthopedic Surgery

## 2011-06-05 ENCOUNTER — Telehealth: Payer: Self-pay | Admitting: Orthopedic Surgery

## 2011-06-05 VITALS — BP 100/50 | Ht 67.0 in | Wt 158.0 lb

## 2011-06-05 DIAGNOSIS — T8450XA Infection and inflammatory reaction due to unspecified internal joint prosthesis, initial encounter: Secondary | ICD-10-CM

## 2011-06-05 DIAGNOSIS — IMO0002 Reserved for concepts with insufficient information to code with codable children: Secondary | ICD-10-CM | POA: Insufficient documentation

## 2011-06-05 NOTE — Telephone Encounter (Signed)
If she has an allergy dont take it

## 2011-06-05 NOTE — Telephone Encounter (Signed)
Tetracycline not available and doesn't come in 100mg , did you want her to have doxy?

## 2011-06-05 NOTE — Telephone Encounter (Signed)
[facility] nurse Tabitha, called to relay that patient has an allergy to medication Benadryl, which had been prescribed today.  Please advise.  [facility] ph# [phone].

## 2011-06-05 NOTE — Patient Instructions (Signed)
Restart Imodium every 6 hours  50 mg of Benadryl with vancomycin doses.    Please have primary care  evaluate rash  Continue vancomycin with a stop date of March 20  Then start 4 weeks of tetracycline 100 mg b.i.d.  Please draw vancomycin trough and creatinine this week send results to the office [phone].

## 2011-06-05 NOTE — Progress Notes (Signed)
Patient ID: [name], female   DOB: 28-Jul-1945, 66 y.o.   MRN: [id number] Chief Complaint  Patient presents with  . Follow-up    Right hip wound infection. gluteus tendon rupture seroma and repair 05/24/11   PRE-OPERATIVE DIAGNOSIS: wound infection right hip status post bipolar  POST-OPERATIVE DIAGNOSIS: wound dehiscence right hip status post bipolar and gluteus medius partial tear  PROCEDURE: Procedure(s) (LRB):  IRRIGATION AND DEBRIDEMENT WOUND (Right)  OPEN SURGICAL REPAIR OF GLUTEAL TENDON (Right)            Component  Value    Specimen Description  WOUND RIGHT HIP    Special Requests  VANCOMYCIN    Gram Stain  RARE WBC PRESENT, PREDOMINANTLY PMN NO SQUAMOUS EPITHELIAL CELLS SEEN NO ORGANISMS SEEN    Culture  RARE STAPHYLOCOCCUS AUREUS Note: RIFAMPIN AND GENTAMICIN SHOULD NOT BE USED AS SINGLE DRUGS FOR TREATMENT OF STAPH INFECTIONS.    Report Status  05/27/2011 FINAL    Organism ID, Bacteria  STAPHYLOCOCCUS AUREUS    Resulting Agency  [facility]     Culture & Susceptibility     STAPHYLOCOCCUS AUREUS          Antibiotic  Sensitivity  Microscan  Status      CLINDAMYCIN  Resistant  >=8  Final      Method:  MIC      ERYTHROMYCIN  Resistant  >=8  Final      Method:  MIC      GENTAMICIN  Sensitive  <=0.5  Final      Method:  MIC      LEVOFLOXACIN  Resistant  >=8  Final      Method:  MIC      MOXIFLOXACIN  Resistant  2  Final      Method:  MIC      OXACILLIN  Sensitive  0.5  Final      Method:  MIC      PENICILLIN  Resistant  >=0.5  Final      Method:  MIC      RIFAMPIN  Sensitive  <=0.5  Final      Method:  MIC      TETRACYCLINE  Sensitive  <=1  Final      Method:  MIC      TRIMETH/SULFA  Sensitive  <=10  Final      Method:  MIC      VANCOMYCIN  Sensitive  1  Final      Method:  MIC       Comments  STAPHYLOCOCCUS AUREUS (MIC)        RARE STAPHYLOCOCCUS AUREUS    05/01/2011  PRE-OPERATIVE DIAGNOSIS: RIGHT HIP FRACTURE - FEMORAL NECK DISPLACED  POST-OPERATIVE  DIAGNOSIS: RIGHT HIP FRACTURE - FEMORAL NECK DISPLACED  PROCEDURE: Procedure(s):  ARTHROPLASTY BIPOLAR HIP   Her staples around her wound looks clean.  She's having some diarrhea and she has a rash on her back and trunk  Recommend restart Imodium.  Give Benadryl with vancomycin.  She can have the primary care physician evaluate the rash.  Continue vancomycin until March 20.  4 week f/u

## 2011-06-06 ENCOUNTER — Telehealth: Payer: Self-pay | Admitting: *Deleted

## 2011-06-06 NOTE — Telephone Encounter (Signed)
Okay for doxy per dr Romeo Apple, nurse aware

## 2011-06-06 NOTE — Telephone Encounter (Signed)
Error

## 2011-07-04 ENCOUNTER — Ambulatory Visit (INDEPENDENT_AMBULATORY_CARE_PROVIDER_SITE_OTHER): Payer: Medicare Other | Admitting: Orthopedic Surgery

## 2011-07-04 ENCOUNTER — Encounter: Payer: Self-pay | Admitting: Orthopedic Surgery

## 2011-07-04 VITALS — BP 118/64 | Ht 67.0 in | Wt 158.0 lb

## 2011-07-04 DIAGNOSIS — M25559 Pain in unspecified hip: Secondary | ICD-10-CM

## 2011-07-04 MED ORDER — IBUPROFEN 800 MG PO TABS: 800.0000 mg | ORAL_TABLET | Freq: Three times a day (TID) | ORAL | Status: AC | PRN

## 2011-07-04 NOTE — Progress Notes (Signed)
Patient ID: [name], female   DOB: December 27, 1945, 66 y.o.   MRN: [id number] Chief Complaint  Patient presents with  . Wound Check    wound check   Operative procedure February 5, bipolar replacement RIGHT hip for RIGHT hip fracture.  February 28 evacuation of seroma re\re repair abductor after partial rupture  Treatment with IV vancomycin for prophylactic treatment of possible infection, with positive culture of rare staph. Aureus.  The wound has closed. The patient complains of pain on Vicodin. We've added some ibuprofen. Follow up in May for x-ray of the pelvis.

## 2011-08-23 ENCOUNTER — Ambulatory Visit (INDEPENDENT_AMBULATORY_CARE_PROVIDER_SITE_OTHER): Payer: Medicare Other

## 2011-08-23 ENCOUNTER — Ambulatory Visit (INDEPENDENT_AMBULATORY_CARE_PROVIDER_SITE_OTHER): Payer: Medicare Other | Admitting: Orthopedic Surgery

## 2011-08-23 ENCOUNTER — Encounter: Payer: Self-pay | Admitting: Orthopedic Surgery

## 2011-08-23 VITALS — BP 100/62 | Ht 67.0 in | Wt 158.0 lb

## 2011-08-23 DIAGNOSIS — S72009A Fracture of unspecified part of neck of unspecified femur, initial encounter for closed fracture: Secondary | ICD-10-CM

## 2011-08-23 NOTE — Progress Notes (Signed)
Patient ID: [name], female   DOB: 10-Aug-1945, 66 y.o.   MRN: [id number] Chief Complaint  Patient presents with  . Follow-up    7 week recheck and xray pelvis, DOS 05/24/11   BP 100/62  Ht 5\' 7"  (1.702 m)  Wt 158 lb (71.668 kg)  BMI 24.75 kg/m2  Operative procedure February 5, bipolar replacement RIGHT hip for RIGHT hip fracture.  February 28 evacuation of seroma re\re repair abductor after partial rupture  Wound healed   Ambulates with walker   Complained of dysuria, and diarrhea.  Address with primary care doctor at [facility] nursing facility

## 2011-08-23 NOTE — Patient Instructions (Signed)
No additional instructions. 

## 2011-08-29 ENCOUNTER — Other Ambulatory Visit: Payer: Self-pay | Admitting: *Deleted

## 2011-08-29 MED ORDER — HYDROCODONE-ACETAMINOPHEN 5-325 MG PO TABS: 1.0000 | ORAL_TABLET | ORAL | Status: AC | PRN

## 2011-09-13 ENCOUNTER — Other Ambulatory Visit: Payer: Self-pay | Admitting: Orthopedic Surgery

## 2011-10-10 ENCOUNTER — Encounter: Payer: Self-pay | Admitting: Orthopedic Surgery

## 2011-10-10 ENCOUNTER — Ambulatory Visit (INDEPENDENT_AMBULATORY_CARE_PROVIDER_SITE_OTHER): Payer: Medicare Other

## 2011-10-10 ENCOUNTER — Ambulatory Visit (INDEPENDENT_AMBULATORY_CARE_PROVIDER_SITE_OTHER): Payer: Medicare Other | Admitting: Orthopedic Surgery

## 2011-10-10 VITALS — BP 100/60 | Ht 67.0 in | Wt 158.0 lb

## 2011-10-10 DIAGNOSIS — M5431 Sciatica, right side: Secondary | ICD-10-CM

## 2011-10-10 DIAGNOSIS — M25551 Pain in right hip: Secondary | ICD-10-CM

## 2011-10-10 DIAGNOSIS — M25559 Pain in unspecified hip: Secondary | ICD-10-CM

## 2011-10-10 DIAGNOSIS — M5430 Sciatica, unspecified side: Secondary | ICD-10-CM

## 2011-10-10 MED ORDER — ACETAMINOPHEN-CODEINE 300-30 MG PO TABS: 1.0000 | ORAL_TABLET | Freq: Four times a day (QID) | ORAL | Status: DC | PRN

## 2011-10-10 MED ORDER — IBUPROFEN 800 MG PO TABS: 800.0000 mg | ORAL_TABLET | Freq: Three times a day (TID) | ORAL | Status: AC | PRN

## 2011-10-10 MED ORDER — GABAPENTIN 100 MG PO CAPS: 100.0000 mg | ORAL_CAPSULE | Freq: Three times a day (TID) | ORAL | Status: DC

## 2011-10-10 NOTE — Patient Instructions (Addendum)
She has sciatica and back pain   Start PT  Heat as needed  Analgesia, gabapentin  and nsaid  Back Pain, Adult Low back pain is very common. About 1 in 5 people have back pain. The cause of low back pain is rarely dangerous. The pain often gets better over time. About half of people with a sudden onset of back pain feel better in just 2 weeks. About 8 in 10 people feel better by 6 weeks.   CAUSES Some common causes of back pain include:  Strain of the muscles or ligaments supporting the spine.   Wear and tear (degeneration) of the spinal discs.   Arthritis.   Direct injury to the back.  DIAGNOSIS Most of the time, the direct cause of low back pain is not known. However, back pain can be treated effectively even when the exact cause of the pain is unknown. Answering your caregiver's questions about your overall health and symptoms is one of the most accurate ways to make sure the cause of your pain is not dangerous. If your caregiver needs more information, he or she may order lab work or imaging tests (X-rays or MRIs). However, even if imaging tests show changes in your back, this usually does not require surgery. HOME CARE INSTRUCTIONS For many people, back pain returns. Since low back pain is rarely dangerous, it is often a condition that people can learn to manage on their own.    Remain active. It is stressful on the back to sit or stand in one place. Do not sit, drive, or stand in one place for more than 30 minutes at a time. Take short walks on level surfaces as soon as pain allows. Try to increase the length of time you walk each day.   Do not stay in bed. Resting more than 1 or 2 days can delay your recovery.   Do not avoid exercise or work. Your body is made to move. It is not dangerous to be active, even though your back may hurt. Your back will likely heal faster if you return to being active before your pain is gone.   Pay attention to your body when you  bend and lift. Many  people have less discomfort when lifting if they bend their knees, keep the load close to their bodies, and avoid twisting. Often, the most comfortable positions are those that put less stress on your recovering back.   Find a comfortable position to sleep. Use a firm mattress and lie on your side with your knees slightly bent. If you lie on your back, put a pillow under your knees.   Only take over-the-counter or prescription medicines as directed by your caregiver. Over-the-counter medicines to reduce pain and inflammation are often the most helpful. Your caregiver may prescribe muscle relaxant drugs. These medicines help dull your pain so you can more quickly return to your normal activities and healthy exercise.   Put ice on the injured area.   Put ice in a plastic bag.   Place a towel between your skin and the bag.   Leave the ice on for 15 to 20 minutes, 3 to 4 times a day for the first 2 to 3 days. After that, ice and heat may be alternated to reduce pain and spasms.   Ask your caregiver about trying back exercises and gentle massage. This may be of some benefit.   Avoid feeling anxious or stressed. Stress increases muscle tension and can worsen back pain.  It is important to recognize when you are anxious or stressed and learn ways to manage it. Exercise is a great option.  SEEK MEDICAL CARE IF:  You have pain that is not relieved with rest or medicine.   You have pain that does not improve in 1 week.   You have new symptoms.   You are generally not feeling well.  SEEK IMMEDIATE MEDICAL CARE IF:    You have pain that radiates from your back into your legs.   You develop new bowel or bladder control problems.   You have unusual weakness or numbness in your arms or legs.   You develop nausea or vomiting.   You develop abdominal pain.   You feel faint.  Document Released: 03/12/2005 Document Revised: 03/01/2011 Document Reviewed: 07/31/2010 [facility] Patient Information  2012 [place], [place].Sciatica Sciatica is a weakness and/or changes in sensation (tingling, jolts, hot and cold, numbness) along the path the sciatic nerve travels. Irritation or damage to lumbar nerve roots is often also referred to as lumbar radiculopathy.   Lumbar radiculopathy (Sciatica) is the most common form of this problem. Radiculopathy can occur in any of the nerves coming out of the spinal cord. The problems caused depend on which nerves are involved. The sciatic nerve is the large nerve supplying the branches of nerves going from the hip to the toes. It often causes a numbness or weakness in the skin and/or muscles that the sciatic nerve serves. It also may cause symptoms (problems) of pain, burning, tingling, or electric shock-like feelings in the path of this nerve. This usually comes from injury to the fibers that make up the sciatic nerve. Some of these symptoms are low back pain and/or unpleasant feelings in the following areas:  From the mid-buttock down the back of the leg to the back of the knee.   And/or the outside of the calf and top of the foot.   And/or behind the inner ankle to the sole of the foot.  CAUSES    Herniated or slipped disc. Discs are the little cushions between the bones in the back.   Pressure by the piriformis muscle in the buttock on the sciatic nerve (Piriformis Syndrome).   Misalignment of the bones in the lower back and buttocks (Sacroiliac Joint Derangement).   Narrowing of the spinal canal that puts pressure on or pinches the fibers that make up the sciatic nerve.   A slipped vertebra that is out of line with those above or beneath it.   Abnormality of the nervous system itself so that nerve fibers do not transmit signals properly, especially to feet and calves (neuropathy).   Tumor (this is rare).  Your caregiver can usually determine the cause of your sciatica and begin the treatment most likely to help you. TREATMENT   Taking over-the-counter  painkillers, physical therapy, rest, exercise, spinal manipulation, and injections of anesthetics and/or steroids may be used. Surgery, acupuncture, and Yoga can also be effective. Mind over matter techniques, mental imagery, and changing factors such as your bed, chair, desk height, posture, and activities are other treatments that may be helpful. You and your caregiver can help determine what is best for you. With proper diagnosis, the cause of most sciatica can be identified and removed. Communication and cooperation between your caregiver and you is essential. If you are not successful immediately, do not be discouraged. With time, a proper treatment can be found that will make you comfortable. HOME CARE INSTRUCTIONS    If  the pain is coming from a problem in the back, applying ice to that area for 15 to 20 minutes, 3 to 4 times per day while awake, may be helpful. Put the ice in a plastic bag. Place a towel between the bag of ice and your skin.   You may exercise or perform your usual activities if these do not aggravate your pain, or as suggested by your caregiver.   Only take over-the-counter or prescription medicines for pain, discomfort, or fever as directed by your caregiver.   If your caregiver has given you a follow-up appointment, it is very important to keep that appointment. Not keeping the appointment could result in a chronic or permanent injury, pain, and disability. If there is any problem keeping the appointment, you must call back to this facility for assistance.  SEEK IMMEDIATE MEDICAL CARE IF:    You experience loss of control of bowel or bladder.   You have increasing weakness in the trunk, buttocks, or legs.   There is numbness in any areas from the hip down to the toes.   You have difficulty walking or keeping your balance.   You have any of the above, with fever or forceful vomiting.  Document Released: 03/06/2001 Document Revised: 03/01/2011 Document Reviewed:  10/24/2007 [facility] Patient Information 2012 [place], [place].

## 2011-10-10 NOTE — Progress Notes (Signed)
  Subjective:    Patient ID: [name], female    DOB: 11/21/1945, 66 y.o.   MRN: [id number]  HPI Comments: Patient presents with:   Hip Pain - right hip pain x 2 weeks   Status post bipolar hip prosthesis presents complaining of increasing RIGHT hip pain for the last 2 weeks. Despite being on Vicodin.  She indicates that her her RIGHT leg hurts down to her foot. Denies numbness or tingling just pain. Also, having some back pain. No history of bowel or bladder dysfunction.  Review of systems there is no pain over the hip joint, groin or surgical incision site. Should be noted that she had a Rupture of the abductor repair requiring reoperation for repair and evacuation of seroma.  Surgery was February 20 13th of and subsequent surgery February 26 of 20 13th  Hip Pain       Review of Systems     Objective:   Physical Exam  Constitutional: She is oriented to person, place, and time. She appears well-developed and well-nourished.  Cardiovascular: Normal rate.   Pulmonary/Chest: Effort normal.  Musculoskeletal:       RIGHT hip exam shows no tenderness over the previous incision, which healed completely.  SHE HAS  equal reflexes bilaterally at the knee and ankle. She has tenderness over the RIGHT gluteal region lumbar spine midline in the lower segments.  Neurological: She is alert and oriented to person, place, and time. She has normal reflexes. She displays normal reflexes. She exhibits normal muscle tone.  Skin: Skin is warm and dry.  Psychiatric: She has a normal mood and affect. Her behavior is normal. Thought content normal.  Upper extremity exam  Inspection and palpation revealed no abnormalities in the upper extremities.  Range of motion is full without contracture.  Motor exam is normal with grade 5 strength.  The joints are fully reduced without subluxation.  There is no atrophy or tremor and muscle tone is normal.  All joints are stable.   X-rays show a loss  of lordosis, scoliosis, mild degenerative disc disease.        Assessment & Plan:  Sciatica, back pain. Status post RIGHT hip bipolar prosthesis. Status post repair of abductor rupture, leading to wound seroma, which healed normally.  Physical therapy and medications as ordered

## 2011-10-13 ENCOUNTER — Emergency Department (HOSPITAL_COMMUNITY): Payer: Medicare Other

## 2011-10-13 ENCOUNTER — Emergency Department (HOSPITAL_COMMUNITY)
Admission: EM | Admit: 2011-10-13 | Discharge: 2011-10-13 | Disposition: A | Discharge status: Skilled Nursing Facility | Payer: Medicare Other | Attending: Emergency Medicine | Admitting: Emergency Medicine

## 2011-10-13 ENCOUNTER — Encounter (HOSPITAL_COMMUNITY): Payer: Self-pay

## 2011-10-13 DIAGNOSIS — G8929 Other chronic pain: Secondary | ICD-10-CM

## 2011-10-13 DIAGNOSIS — M109 Gout, unspecified: Secondary | ICD-10-CM | POA: Insufficient documentation

## 2011-10-13 DIAGNOSIS — K219 Gastro-esophageal reflux disease without esophagitis: Secondary | ICD-10-CM | POA: Insufficient documentation

## 2011-10-13 DIAGNOSIS — M25559 Pain in unspecified hip: Secondary | ICD-10-CM | POA: Insufficient documentation

## 2011-10-13 DIAGNOSIS — W19XXXA Unspecified fall, initial encounter: Secondary | ICD-10-CM

## 2011-10-13 HISTORY — DX: Noninfective gastroenteritis and colitis, unspecified: K52.9

## 2011-10-13 HISTORY — DX: Other chronic pain: G89.29

## 2011-10-13 HISTORY — DX: Major depressive disorder, single episode, unspecified: F32.9

## 2011-10-13 HISTORY — DX: Pain in right hip: M25.551

## 2011-10-13 HISTORY — DX: Anxiety disorder, unspecified: F41.9

## 2011-10-13 HISTORY — DX: Depression, unspecified: F32.A

## 2011-10-13 HISTORY — DX: Dorsalgia, unspecified: M54.9

## 2011-10-13 NOTE — ED Notes (Signed)
Pt ambulated to door of room and back to bed with assist x 2. Limp present but pt states she has had the limp due to hx of right hip. Pt states she doesn't ambulate often and when she does at long term facility, she uses a walker. Pt ambulated well with assistance x 2.

## 2011-10-13 NOTE — ED Notes (Signed)
Pt is resident of [facility] where she was getting up from the bed to get into the wheelchair when she slid down and landed on her right hip on the floor.  Pt concerned because she has previous fx of right hip.

## 2011-10-13 NOTE — ED Notes (Signed)
Pt resting quietly, awaiting dispo

## 2011-10-13 NOTE — ED Provider Notes (Signed)
History     CSN: [id number]  Arrival date & time 10/13/11  4098   First MD Initiated Contact with Patient 10/13/11 [phone]      Chief Complaint  Patient presents with  . Fall  . Hip Pain    HPI Pt was seen at 0720.  Per NH report, EMS and pt, c/o sudden onset and resolution of one episode of fall while trying to get from bed to wheelchair PTA.  Pt states she "slid" to floor and landed on her right hip.  Pt c/o right shoulder, hip, and knee pain.  Denies hitting head, no LOC, no AMS since fall, no CP/SOB, no abd pain, no N/V/D, no neck or back pain.    Past Medical History  Diagnosis Date  . IBS (irritable bowel syndrome)   . Depression   . GERD (gastroesophageal reflux disease)   . Gout   . Chronic back pain   . Chronic right hip pain   . Chronic diarrhea   . Anxiety and depression     Past Surgical History  Procedure Date  . Hip arthroplasty 05/01/2011    Procedure: right ARTHROPLASTY BIPOLAR HIP;  Surgeon: Fuller Canada, MD;  Location: [facility];  Service: Orthopedics;  Laterality: Right;  . Incision and drainage of wound 05/24/2011    Procedure: IRRIGATION AND DEBRIDEMENT WOUND;  Surgeon: Fuller Canada, MD;  Location: [facility];  Service: Orthopedics;  Laterality: Right;   History  Substance Use Topics  . Smoking status: Never Smoker   . Smokeless tobacco: Not on file  . Alcohol Use: No    Review of Systems ROS: Statement: All systems negative except as marked or noted in the HPI; Constitutional: Negative for fever and chills. ; ; Eyes: Negative for eye pain, redness and discharge. ; ; ENMT: Negative for ear pain, hoarseness, nasal congestion, sinus pressure and sore throat. ; ; Cardiovascular: Negative for chest pain, palpitations, diaphoresis, dyspnea and peripheral edema. ; ; Respiratory: Negative for cough, wheezing and stridor. ; ; Gastrointestinal: Negative for nausea, vomiting, diarrhea, abdominal pain, blood in stool, hematemesis, jaundice and rectal bleeding. . ; ;  Genitourinary: Negative for dysuria, flank pain and hematuria. ; ; Musculoskeletal: +right shoulder, hip, knee pain.  Negative for back pain and neck pain. Negative for swelling; ; Skin: Negative for pruritus, rash, abrasions, blisters, bruising and skin lesion.; ; Neuro: Negative for headache, lightheadedness and neck stiffness. Negative for weakness, altered level of consciousness , altered mental status, extremity weakness, paresthesias, involuntary movement, seizure and syncope.     Allergies  Diphenhydramine  Home Medications   Current Outpatient Rx  Name Route Sig Dispense Refill  . ALPRAZOLAM 0.5 MG PO TABS Oral Take 1 tablet (0.5 mg total) by mouth 3 (three) times daily. 30 tablet 2  . DICYCLOMINE HCL 10 MG PO CAPS Oral Take 1 capsule (10 mg total) by mouth 4 (four) times daily -  before meals and at bedtime. 40 capsule 0  . FESOTERODINE FUMARATE ER 4 MG PO TB24 Oral Take 1 tablet (4 mg total) by mouth daily. 30 tablet 0  . FLUOXETINE HCL 20 MG PO CAPS Oral Take 60 mg by mouth daily.    Marland Kitchen GABAPENTIN 100 MG PO CAPS Oral Take 1 capsule (100 mg total) by mouth 3 (three) times daily. 90 capsule 2  . IBUPROFEN 800 MG PO TABS Oral Take 1 tablet (800 mg total) by mouth every 8 (eight) hours as needed for pain. 90 tablet 5  . LOPERAMIDE HCL  2 MG PO CAPS Oral Take 2-4 mg by mouth 4 (four) times daily as needed. For diarrhea, take 2 tablets (4mg ) initially then take 2 mg after each loose stool    . MIRTAZAPINE 15 MG PO TABS Oral Take 15 mg by mouth at bedtime.    . NORCO 5-325 MG PO TABS  TAKE (1) TABLET BY MOUTH EVERY FOUR HOURS AS NEEDED FOR PAIN. 60 each 5  . OLANZAPINE 5 MG PO TABS Oral Take 5 mg by mouth at bedtime.    . OMEPRAZOLE 20 MG PO CPDR Oral Take 20 mg by mouth daily.    Marland Kitchen SIMVASTATIN 20 MG PO TABS Oral Take 20 mg by mouth every evening.      BP 98/64  Pulse 76  Temp 98 F (36.7 C) (Oral)  Resp 20  Ht 5\' 7"  (1.702 m)  Wt 125 lb (56.7 kg)  BMI 19.58 kg/m2  SpO2  100%  Physical Exam 0725: Physical examination:  Nursing notes reviewed; Vital signs and O2 SAT reviewed;  Constitutional: Well developed, Well nourished, Well hydrated, In no acute distress; Head:  Normocephalic, atraumatic; Eyes: EOMI, PERRL, No scleral icterus; ENMT: Mouth and pharynx normal, Mucous membranes moist; Neck: Supple, Full range of motion, No lymphadenopathy; Cardiovascular: Regular rate and rhythm, No gallop; Respiratory: Breath sounds clear & equal bilaterally, No wheezes.  Speaking full sentences with ease, Normal respiratory effort/excursion; Chest: Nontender, Movement normal; Abdomen: Soft, Nontender, Nondistended, Normal bowel sounds; Spine:  No midline CS, TS, LS tenderness.;; Extremities: Pulses normal, Pelvis stable.  +FROM right knee, including able to lift extended RLE off stretcher, and extend right lower leg against resistance.  No ligamentous laxity.  No patellar or quad tendon step-offs.  NMS intact right foot, strong pedal pp.  No proximal fibular head tenderness.  No edema, erythema, warmth, deformity, ecchymosis, or open wounds. NT right hip/knee/ankle/foot.  NT right shoulder/elbow/wrist/hand. Right shoulder w/FROM.  NT to palp entire joint, AC joint, clavicle NT, scapula NT, proximal humerus NT, biceps tendon NT over bicipital groove.  Motor strength at shoulder normal.  Sensation intact over deltoid region, distal NMS intact with right hand having intact sensation and strength in the distribution of the median, radial, and ulnar nerve function.  Strong radial pulse.  +FROM right elbow with intact motor strength biceps and triceps muscles to resistance. No deformity, Full range of motion major/large joints of bilat UE's and LE's without pain or tenderness to palp, Neurovascularly intact, No tenderness, No edema, No calf edema or asymmetry.; Neuro: AA&Ox3, Major CN grossly intact.  Speech clear. No gross focal motor or sensory deficits in extremities.; Skin: Color normal, Warm,  Dry, no abrasions/ecchymosis to face, torso, UE's or LE's.   ED Course  Procedures   MDM  MDM Reviewed: nursing note, vitals and previous chart Reviewed previous: x-ray Interpretation: x-ray and CT scan     Dg Shoulder Right 10/13/2011  *RADIOLOGY REPORT*  Clinical Data: Fall.  Shoulder pain.  RIGHT SHOULDER - 2+ VIEW  Comparison: 04/29/2011  Findings: Mild degenerative AC joint spurring noted.  The patient was unable to externally rotate the shoulder.  No dislocation is evident.  No discrete fracture noted.  IMPRESSION:  1.  No acute bony findings.  Mildly reduced sensitivity as the patient was unable to externally rotate shoulder.  Original Report Authenticated By: Dellia Cloud, M.D.   Dg Hip Complete Right 10/13/2011  *RADIOLOGY REPORT*  Clinical Data: Fall.  Right hip pain.  RIGHT HIP - COMPLETE 2+ VIEW  Comparison: 10/10/2011  Findings: Bipolar hip prosthesis noted on the right, with a small screw anchor along the greater trochanter.  Adjacent heterotopic ossification noted.  No acute fracture or complicating feature is observed.  IMPRESSION:  1.  Right bipolar hip prosthesis noted.  No acute complicating feature is observed. 2.  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  Original Report Authenticated By: Dellia Cloud, M.D.   Ct Knee Right Wo Contrast 10/13/2011  *RADIOLOGY REPORT*  Clinical Data: Fall.  Irregular medial tibial plateau on conventional radiography.  CT OF THE RIGHT KNEE WITHOUT CONTRAST  Technique:  Multidetector CT imaging was performed according to the standard protocol. Multiplanar CT image reconstructions were also generated.  Comparison: 10/13/2011 radiographs  Findings: No fracture of the tibial plateau is identified. Sensitivity is mildly reduced due to considerable motion artifact which was involuntary.  Degenerative findings noted in the medial compartment, with loss of articular space in both the medial and lateral compartments.  No  knee effusion observed.  The outlines of the cruciate ligaments show normal slope.  IMPRESSION:  1.  No fracture identified.  Mildly reduced sensitivity for highly subtle fractures due to involuntary motion artifact. 2.  No knee effusion.  Original Report Authenticated By: Dellia Cloud, M.D.   Dg Knee Complete 4 Views Right 10/13/2011  *RADIOLOGY REPORT*  Clinical Data: Fall.  Right lower extremity pain.  RIGHT KNEE - COMPLETE 4+ VIEW  Comparison: None.  Findings: Subtle subperiosteal lucency and mild irregularity of the medial tibial plateau could be related to spurring or a subtle fracture.  Correlate with the medial tibial plateau tenderness in determining need for CT or MRI.  No knee effusion is observed on the lateral projection.  IMPRESSION:  1.  Irregularity along the medial tibial plateau could be from spurring or subtle fracture.  Correlate with tenderness in this vicinity in determining need for CT/MRI workup.  Original Report Authenticated By: Dellia Cloud, M.D.     1000:  Pt stood at bedside and ambulated to doorway without distress and assist x2 (usually walks with walker).  EPIC chart review shows pt has had multiple visits to Ortho MD for chronic right hip pain for the past several months.  Pt's BP per her baseline per previous chart review.  Dx testing d/w pt.  Questions answered.  Verb understanding, agreeable to d/c home with outpt f/u.  The patient appears reasonably screened and/or stabilized for discharge and I doubt any other medical condition or other [facility] requiring further screening, evaluation, or treatment in the ED at this time prior to discharge.          Laray Anger, DO 10/15/11 1734

## 2011-10-13 NOTE — ED Notes (Signed)
[facility] called to pick pt up. States they will send someone shortly

## 2011-10-13 NOTE — ED Notes (Signed)
Pt states right shoulder, right upper leg, and right foot pain. NAD. Pt slid out of chair at [facility]

## 2011-10-15 ENCOUNTER — Telehealth: Payer: Self-pay | Admitting: Orthopedic Surgery

## 2011-10-15 NOTE — Telephone Encounter (Signed)
Call from Lupita Leash at [facility] facility: states patient seen in Emergency Room over weekend (10/13/11).  States hit head and was also evaluated for hip, due to recent fracture surgery.  States no fractures, told by [facility] Emergency department physician.  Facility ph# [phone]. Please advise.

## 2011-10-15 NOTE — Telephone Encounter (Signed)
She has been seen and evaluated  So no other advice needed

## 2011-10-15 NOTE — Telephone Encounter (Signed)
Called back to [facility]; no answer and no answer machine message after several rings.  Faxed note with Dr. Mort Sawyers reply to attention of Lupita Leash, fax [phone].

## 2011-10-16 NOTE — Telephone Encounter (Signed)
Lupita Leash at [facility] confirmed that she received the faxed notes.

## 2011-12-05 ENCOUNTER — Encounter: Payer: Self-pay | Admitting: Orthopedic Surgery

## 2011-12-05 ENCOUNTER — Ambulatory Visit (INDEPENDENT_AMBULATORY_CARE_PROVIDER_SITE_OTHER): Payer: Medicare Other | Admitting: Orthopedic Surgery

## 2011-12-05 VITALS — BP 126/80 | Ht 67.0 in | Wt 125.0 lb

## 2011-12-05 DIAGNOSIS — IMO0002 Reserved for concepts with insufficient information to code with codable children: Secondary | ICD-10-CM

## 2011-12-05 MED ORDER — GABAPENTIN 300 MG PO CAPS: 300.0000 mg | ORAL_CAPSULE | Freq: Three times a day (TID) | ORAL | Status: DC

## 2011-12-05 MED ORDER — PREDNISONE 10 MG PO KIT: 10.0000 mg | PACK | ORAL | Status: DC

## 2011-12-05 NOTE — Progress Notes (Signed)
Patient ID: [name], female   DOB: 1946/03/01, 66 y.o.   MRN: [id number] Chief Complaint  Patient presents with  . Follow-up    8 week recheck sciatica    Status post right hip bipolar replacement complicated by wound infection  The patient recovered from that now complains of persistent radicular pain in her right lower extremity radiating from her hip down to her right foot. She does not want to bear weight on the right lower extremity her pain is not controlled by Vicodin 5 mg  She was sent for physical therapy completed 2 visits in 6 weeks presents back with continued pain  Recommend MRI lumbar  Start steroid Dosepak Increase Neurontin 20 mg 3 times a day Re\re incised and ordered physical therapy  MRI as stated call results to patient make recommendation regarding epidural series. Patient probably not surgical candidate unless severe compressive disease is found

## 2011-12-05 NOTE — Patient Instructions (Signed)
MRI L SPINE   Sciatica Sciatica is a weakness and/or changes in sensation (tingling, jolts, hot and cold, numbness) along the path the sciatic nerve travels. Irritation or damage to lumbar nerve roots is often also referred to as lumbar radiculopathy.   Lumbar radiculopathy (Sciatica) is the most common form of this problem. Radiculopathy can occur in any of the nerves coming out of the spinal cord. The problems caused depend on which nerves are involved. The sciatic nerve is the large nerve supplying the branches of nerves going from the hip to the toes. It often causes a numbness or weakness in the skin and/or muscles that the sciatic nerve serves. It also may cause symptoms (problems) of pain, burning, tingling, or electric shock-like feelings in the path of this nerve. This usually comes from injury to the fibers that make up the sciatic nerve. Some of these symptoms are low back pain and/or unpleasant feelings in the following areas:  From the mid-buttock down the back of the leg to the back of the knee.   And/or the outside of the calf and top of the foot.   And/or behind the inner ankle to the sole of the foot.  CAUSES    Herniated or slipped disc. Discs are the little cushions between the bones in the back.   Pressure by the piriformis muscle in the buttock on the sciatic nerve (Piriformis Syndrome).   Misalignment of the bones in the lower back and buttocks (Sacroiliac Joint Derangement).   Narrowing of the spinal canal that puts pressure on or pinches the fibers that make up the sciatic nerve.   A slipped vertebra that is out of line with those above or beneath it.   Abnormality of the nervous system itself so that nerve fibers do not transmit signals properly, especially to feet and calves (neuropathy).   Tumor (this is rare).  Your caregiver can usually determine the cause of your sciatica and begin the treatment most likely to help you. TREATMENT   Taking over-the-counter  painkillers, physical therapy, rest, exercise, spinal manipulation, and injections of anesthetics and/or steroids may be used. Surgery, acupuncture, and Yoga can also be effective. Mind over matter techniques, mental imagery, and changing factors such as your bed, chair, desk height, posture, and activities are other treatments that may be helpful. You and your caregiver can help determine what is best for you. With proper diagnosis, the cause of most sciatica can be identified and removed. Communication and cooperation between your caregiver and you is essential. If you are not successful immediately, do not be discouraged. With time, a proper treatment can be found that will make you comfortable. HOME CARE INSTRUCTIONS    If the pain is coming from a problem in the back, applying ice to that area for 15 to 20 minutes, 3 to 4 times per day while awake, may be helpful. Put the ice in a plastic bag. Place a towel between the bag of ice and your skin.   You may exercise or perform your usual activities if these do not aggravate your pain, or as suggested by your caregiver.   Only take over-the-counter or prescription medicines for pain, discomfort, or fever as directed by your caregiver.   If your caregiver has given you a follow-up appointment, it is very important to keep that appointment. Not keeping the appointment could result in a chronic or permanent injury, pain, and disability. If there is any problem keeping the appointment, you must call back to  this facility for assistance.  SEEK IMMEDIATE MEDICAL CARE IF:    You experience loss of control of bowel or bladder.   You have increasing weakness in the trunk, buttocks, or legs.   There is numbness in any areas from the hip down to the toes.   You have difficulty walking or keeping your balance.   You have any of the above, with fever or forceful vomiting.  Document Released: 03/06/2001 Document Revised: 03/01/2011 Document Reviewed:  10/24/2007 [facility] Patient Information 2012 [place], [place].

## 2011-12-13 ENCOUNTER — Telehealth: Payer: Self-pay | Admitting: Radiology

## 2011-12-13 NOTE — Telephone Encounter (Signed)
Patient has an MRI appointment at [facility] on 12-14-11. Patient has Medicare, no precert is needed.

## 2011-12-14 ENCOUNTER — Ambulatory Visit (HOSPITAL_COMMUNITY)
Admission: RE | Admit: 2011-12-14 | Discharge: 2011-12-14 | Disposition: A | Discharge status: Home / Self Care | Payer: Medicare Other | Source: Ambulatory Visit | Attending: Orthopedic Surgery | Admitting: Orthopedic Surgery

## 2011-12-14 DIAGNOSIS — M545 Low back pain, unspecified: Secondary | ICD-10-CM | POA: Insufficient documentation

## 2011-12-14 DIAGNOSIS — M5137 Other intervertebral disc degeneration, lumbosacral region: Secondary | ICD-10-CM | POA: Insufficient documentation

## 2011-12-14 DIAGNOSIS — M79609 Pain in unspecified limb: Secondary | ICD-10-CM | POA: Insufficient documentation

## 2011-12-14 DIAGNOSIS — M51379 Other intervertebral disc degeneration, lumbosacral region without mention of lumbar back pain or lower extremity pain: Secondary | ICD-10-CM | POA: Insufficient documentation

## 2011-12-14 DIAGNOSIS — M5380 Other specified dorsopathies, site unspecified: Secondary | ICD-10-CM | POA: Insufficient documentation

## 2011-12-14 DIAGNOSIS — IMO0002 Reserved for concepts with insufficient information to code with codable children: Secondary | ICD-10-CM

## 2011-12-28 ENCOUNTER — Other Ambulatory Visit (HOSPITAL_COMMUNITY): Payer: Self-pay | Admitting: Internal Medicine

## 2011-12-28 ENCOUNTER — Ambulatory Visit (HOSPITAL_COMMUNITY)
Admission: RE | Admit: 2011-12-28 | Discharge: 2011-12-28 | Disposition: A | Discharge status: Home / Self Care | Payer: Medicare Other | Source: Ambulatory Visit | Attending: Internal Medicine | Admitting: Internal Medicine

## 2011-12-28 DIAGNOSIS — M25569 Pain in unspecified knee: Secondary | ICD-10-CM | POA: Insufficient documentation

## 2011-12-28 DIAGNOSIS — M899 Disorder of bone, unspecified: Secondary | ICD-10-CM | POA: Insufficient documentation

## 2011-12-28 DIAGNOSIS — S8990XA Unspecified injury of unspecified lower leg, initial encounter: Secondary | ICD-10-CM | POA: Insufficient documentation

## 2011-12-28 DIAGNOSIS — M25561 Pain in right knee: Secondary | ICD-10-CM

## 2011-12-28 DIAGNOSIS — W19XXXA Unspecified fall, initial encounter: Secondary | ICD-10-CM | POA: Insufficient documentation

## 2012-01-12 ENCOUNTER — Emergency Department (HOSPITAL_COMMUNITY): Payer: Medicare Other

## 2012-01-12 ENCOUNTER — Inpatient Hospital Stay (HOSPITAL_COMMUNITY)
Admission: EM | Admit: 2012-01-12 | Discharge: 2012-01-16 | DRG: 602 | Disposition: A | Discharge status: Home Health | Payer: Medicare Other | Source: Ambulatory Visit | Attending: Internal Medicine | Admitting: Internal Medicine

## 2012-01-12 DIAGNOSIS — F29 Unspecified psychosis not due to a substance or known physiological condition: Secondary | ICD-10-CM | POA: Diagnosis present

## 2012-01-12 DIAGNOSIS — IMO0002 Reserved for concepts with insufficient information to code with codable children: Secondary | ICD-10-CM

## 2012-01-12 DIAGNOSIS — E876 Hypokalemia: Secondary | ICD-10-CM | POA: Diagnosis present

## 2012-01-12 DIAGNOSIS — F329 Major depressive disorder, single episode, unspecified: Secondary | ICD-10-CM | POA: Diagnosis present

## 2012-01-12 DIAGNOSIS — L89309 Pressure ulcer of unspecified buttock, unspecified stage: Secondary | ICD-10-CM | POA: Diagnosis present

## 2012-01-12 DIAGNOSIS — F411 Generalized anxiety disorder: Secondary | ICD-10-CM | POA: Diagnosis present

## 2012-01-12 DIAGNOSIS — L8994 Pressure ulcer of unspecified site, stage 4: Secondary | ICD-10-CM | POA: Diagnosis present

## 2012-01-12 DIAGNOSIS — F3289 Other specified depressive episodes: Secondary | ICD-10-CM | POA: Diagnosis present

## 2012-01-12 DIAGNOSIS — M129 Arthropathy, unspecified: Secondary | ICD-10-CM | POA: Diagnosis present

## 2012-01-12 DIAGNOSIS — K219 Gastro-esophageal reflux disease without esophagitis: Secondary | ICD-10-CM | POA: Diagnosis present

## 2012-01-12 DIAGNOSIS — L03115 Cellulitis of right lower limb: Secondary | ICD-10-CM | POA: Diagnosis present

## 2012-01-12 DIAGNOSIS — L0390 Cellulitis, unspecified: Secondary | ICD-10-CM

## 2012-01-12 DIAGNOSIS — D638 Anemia in other chronic diseases classified elsewhere: Secondary | ICD-10-CM | POA: Diagnosis present

## 2012-01-12 DIAGNOSIS — L89324 Pressure ulcer of left buttock, stage 4: Secondary | ICD-10-CM | POA: Diagnosis present

## 2012-01-12 DIAGNOSIS — M25559 Pain in unspecified hip: Secondary | ICD-10-CM

## 2012-01-12 DIAGNOSIS — L03119 Cellulitis of unspecified part of limb: Principal | ICD-10-CM | POA: Diagnosis present

## 2012-01-12 DIAGNOSIS — G8929 Other chronic pain: Secondary | ICD-10-CM | POA: Diagnosis present

## 2012-01-12 DIAGNOSIS — M109 Gout, unspecified: Secondary | ICD-10-CM | POA: Diagnosis present

## 2012-01-12 DIAGNOSIS — Z79899 Other long term (current) drug therapy: Secondary | ICD-10-CM

## 2012-01-12 DIAGNOSIS — K589 Irritable bowel syndrome without diarrhea: Secondary | ICD-10-CM | POA: Diagnosis present

## 2012-01-12 DIAGNOSIS — Z96649 Presence of unspecified artificial hip joint: Secondary | ICD-10-CM

## 2012-01-12 DIAGNOSIS — E669 Obesity, unspecified: Secondary | ICD-10-CM | POA: Diagnosis present

## 2012-01-12 DIAGNOSIS — Z993 Dependence on wheelchair: Secondary | ICD-10-CM

## 2012-01-12 DIAGNOSIS — F32A Depression, unspecified: Secondary | ICD-10-CM | POA: Diagnosis present

## 2012-01-12 DIAGNOSIS — L02419 Cutaneous abscess of limb, unspecified: Principal | ICD-10-CM | POA: Diagnosis present

## 2012-01-12 DIAGNOSIS — S52123A Displaced fracture of head of unspecified radius, initial encounter for closed fracture: Secondary | ICD-10-CM

## 2012-01-12 LAB — CBC WITH DIFFERENTIAL/PLATELET
Basophils Absolute: 0 10*3/uL (ref 0.0–0.1)
Eosinophils Absolute: 0.1 10*3/uL (ref 0.0–0.7)
Eosinophils Relative: 1 % (ref 0–5)
HCT: 31.9 % — ABNORMAL LOW (ref 36.0–46.0)
Lymphocytes Relative: 15 % (ref 12–46)
MCH: 25 pg — ABNORMAL LOW (ref 26.0–34.0)
MCHC: 30.1 g/dL (ref 30.0–36.0)
MCV: 83.1 fL (ref 78.0–100.0)
Monocytes Absolute: 0.7 10*3/uL (ref 0.1–1.0)
Platelets: 598 10*3/uL — ABNORMAL HIGH (ref 150–400)
RDW: 17 % — ABNORMAL HIGH (ref 11.5–15.5)

## 2012-01-12 MED ORDER — ONDANSETRON HCL 4 MG/2ML IJ SOLN
4.0000 mg | Freq: Once | INTRAMUSCULAR | Status: AC
  Administered 2012-01-12: 4 mg via INTRAVENOUS
  Filled 2012-01-12: qty 2

## 2012-01-12 MED ORDER — SODIUM CHLORIDE 0.9 % IV SOLN
Freq: Once | INTRAVENOUS | Status: AC
  Administered 2012-01-12: 75 mL/h via INTRAVENOUS

## 2012-01-12 MED ORDER — MORPHINE SULFATE 2 MG/ML IJ SOLN
2.0000 mg | Freq: Once | INTRAMUSCULAR | Status: AC
  Administered 2012-01-12: 2 mg via INTRAVENOUS
  Filled 2012-01-12: qty 1

## 2012-01-12 NOTE — ED Provider Notes (Addendum)
History   This chart was scribed for [employer]. Colon Branch, MD scribed by Magnus Sinning. The patient was seen in room APA14/APA14 at 23:17   CSN: [id number]  Arrival date & time 01/12/12  2235   Chief Complaint  Patient presents with  . Hip Pain    (Consider location/radiation/quality/duration/timing/severity/associated sxs/prior treatment) The history is provided by the patient. No language interpreter was used.   [name] is a 66 y.o. female who presents to the Emergency Department complaining of erythema and warmth at right hip around the site of where total hip replacement surgery was performed. She reports associated throbbing aching pain with palpation and nausea, but denies vomiting. Patient is not ambulatory and uses a wheelchair. She is currently living at a skilled nursing facility.  PCP: Dr. Felecia Shelling  Orthopedist Dr. Romeo Apple Past Medical History  Diagnosis Date  . IBS (irritable bowel syndrome)   . Depression   . GERD (gastroesophageal reflux disease)   . Gout   . Chronic back pain   . Chronic right hip pain   . Chronic diarrhea   . Anxiety and depression     Past Surgical History  Procedure Date  . Hip arthroplasty 05/01/2011    Procedure: right ARTHROPLASTY BIPOLAR HIP;  Surgeon: Fuller Canada, MD;  Location: [facility];  Service: Orthopedics;  Laterality: Right;  . Incision and drainage of wound 05/24/2011    Procedure: IRRIGATION AND DEBRIDEMENT WOUND;  Surgeon: Fuller Canada, MD;  Location: [facility];  Service: Orthopedics;  Laterality: Right;    No family history on file.  History  Substance Use Topics  . Smoking status: Never Smoker   . Smokeless tobacco: Not on file  . Alcohol Use: No   Review of Systems  Gastrointestinal: Positive for nausea.  Skin: Positive for color change (redness noted to right hip).  All other systems reviewed and are negative.    Allergies  Diphenhydramine  Home Medications   Current Outpatient Rx  Name Route Sig  Dispense Refill  . ALPRAZOLAM 0.5 MG PO TABS Oral Take 1 tablet (0.5 mg total) by mouth 3 (three) times daily. 30 tablet 2  . BUPROPION HCL ER (XL) 150 MG PO TB24 Oral Take 300 mg by mouth every morning.    Marland Kitchen DICYCLOMINE HCL 10 MG PO CAPS Oral Take 1 capsule (10 mg total) by mouth 4 (four) times daily -  before meals and at bedtime. 40 capsule 0  . FESOTERODINE FUMARATE ER 4 MG PO TB24 Oral Take 1 tablet (4 mg total) by mouth daily. 30 tablet 0  . FLUOXETINE HCL 20 MG PO CAPS Oral Take 60 mg by mouth daily.    Marland Kitchen GABAPENTIN 300 MG PO CAPS Oral Take 1 capsule (300 mg total) by mouth 3 (three) times daily. 90 capsule 5  . LOPERAMIDE HCL 2 MG PO CAPS Oral Take 2-4 mg by mouth 4 (four) times daily as needed. For diarrhea, take 2 tablets (4mg ) initially then take 2 mg after each loose stool    . MIRTAZAPINE 15 MG PO TABS Oral Take 15 mg by mouth at bedtime.    . NORCO 5-325 MG PO TABS  TAKE (1) TABLET BY MOUTH EVERY FOUR HOURS AS NEEDED FOR PAIN. 60 each 5  . OLANZAPINE 5 MG PO TABS Oral Take 5 mg by mouth at bedtime.    . OMEPRAZOLE 20 MG PO CPDR Oral Take 20 mg by mouth daily.    Marland Kitchen PREDNISONE 10 MG PO KIT Oral Take 1  kit (10 mg total) by mouth as directed. 1 kit 0    12 DAY ds DOSE PACK  . SIMVASTATIN 20 MG PO TABS Oral Take 20 mg by mouth every evening.      BP 97/44  Pulse 90  Temp 99.5 F (37.5 C) (Oral)  Resp 20  SpO2 97%  Physical Exam  Nursing note and vitals reviewed. Constitutional: She is oriented to person, place, and time. She appears well-developed and well-nourished. No distress.  HENT:  Head: Normocephalic and atraumatic.  Eyes: Conjunctivae normal and EOM are normal.  Neck: Neck supple. No tracheal deviation present.  Cardiovascular: Normal rate, regular rhythm and normal heart sounds.   No murmur heard. Pulmonary/Chest: Effort normal. No respiratory distress. She has no wheezes. She has no rales.  Abdominal: She exhibits no distension.  Musculoskeletal: Normal range of  motion.  Neurological: She is alert and oriented to person, place, and time. No sensory deficit.  Skin: Skin is warm and dry. There is erythema.       Cellulits that extends from upper thigh up to the hip. Skin is erythematous and warm to the touch. No peripheral edema. Induration noted at underlying area of operative site.  Psychiatric: She has a normal mood and affect. Her behavior is normal.    ED Course  Procedures (including critical care time) 23:18: Physical exam performed. DIAGNOSTIC STUDIES: Oxygen Saturation is 97% on room air, normal by my interpretation.    COORDINATION OF CARE: Results for orders placed during the hospital encounter of 01/12/12  CBC WITH DIFFERENTIAL      Component Value Range   WBC 8.9  4.0 - 10.5 K/uL   RBC 3.84 (*) 3.87 - 5.11 MIL/uL   Hemoglobin 9.6 (*) 12.0 - 15.0 g/dL   HCT 16.1 (*) 09.6 - 04.5 %   MCV 83.1  78.0 - 100.0 fL   MCH 25.0 (*) 26.0 - 34.0 pg   MCHC 30.1  30.0 - 36.0 g/dL   RDW 40.9 (*) 81.1 - 91.4 %   Platelets 598 (*) 150 - 400 K/uL   Neutrophils Relative 76  43 - 77 %   Neutro Abs 6.7  1.7 - 7.7 K/uL   Lymphocytes Relative 15  12 - 46 %   Lymphs Abs 1.3  0.7 - 4.0 K/uL   Monocytes Relative 8  3 - 12 %   Monocytes Absolute 0.7  0.1 - 1.0 K/uL   Eosinophils Relative 1  0 - 5 %   Eosinophils Absolute 0.1  0.0 - 0.7 K/uL   Basophils Relative 0  0 - 1 %   Basophils Absolute 0.0  0.0 - 0.1 K/uL  COMPREHENSIVE METABOLIC PANEL      Component Value Range   Sodium 136  135 - 145 mEq/L   Potassium 3.4 (*) 3.5 - 5.1 mEq/L   Chloride 99  96 - 112 mEq/L   CO2 29  19 - 32 mEq/L   Glucose, Bld 94  70 - 99 mg/dL   BUN 8  6 - 23 mg/dL   Creatinine, Ser 7.82  0.50 - 1.10 mg/dL   Calcium 9.2  8.4 - 95.6 mg/dL   Total Protein 6.6  6.0 - 8.3 g/dL   Albumin 2.1 (*) 3.5 - 5.2 g/dL   AST 31  0 - 37 U/L   ALT 28  0 - 35 U/L   Alkaline Phosphatase 190 (*) 39 - 117 U/L   Total Bilirubin 0.1 (*) 0.3 - 1.2 mg/dL  GFR calc non Af Amer >90  >90  mL/min   GFR calc Af Amer >90  >90 mL/min    Dg Hip Complete Right  01/13/2012  *RADIOLOGY REPORT*  Clinical Data: Erythema and discomfort status post total hip replacement  RIGHT HIP - COMPLETE 2+ VIEW  Comparison: 10/13/2011  Findings: Status post right total hip arthroplasty.  The acetabular component of the prosthesis has unchanged in orientation when compared to the prior study.  The acetabular component in now more vertical in orientation, with subsidence/erosion into the overlying pelvis.  Associated heterotopic ossification.  No fracture is seen.  No definite loosening.  Secondary foreshortening of the right proximal femur.  IMPRESSION: Status post right total hip arthroplasty.  Acetabular component of the prosthesis is now more vertical in orientation with its side/erosion into the overlying pelvis.  No fracture is seen.   Original Report Authenticated By: Charline Bills, M.D.    Ct Hip Right W Contrast  01/13/2012  *RADIOLOGY REPORT*  Clinical Data: Right hip replacement.  Redness, pain, cellulitis.  CT OF THE RIGHT HIP WITH CONTRAST  Technique:  Multidetector CT imaging was performed following the standard protocol during bolus administration of intravenous contrast.  Contrast: OMNIPAQUE IOHEXOL 300 MG/ML  SOLN  Comparison: Right hip radiographs 01/12/2012.  Findings: Right hip arthroplasty with noncemented femoral and acetabular components.  Beam hardening artifact arising from the hardware limits visualization of relevant anatomy and some portions of the study.  Heterotopic ossification lateral to the hip prosthesis.  Bone demineralization.  No definite cortical break, bone erosion, or periosteal reaction in the visualized areas.  Subcutaneous soft tissue infiltration of the fat around the right hip extending from anterior to bilaterally.  Suggestion of skin thickening.  Changes are consistent with either edema or cellulitis.  No discrete fluid collection or contrast enhancement is  demonstrated that would suggest an abscess.  IMPRESSION: Right hip arthroplasty.  Infiltration in the subcutaneous fat and skin thickening around the right hip consistent with cellulitis or edema.  No discrete abscess demonstrated.   Original Report Authenticated By: Marlon Pel, M.D.      2:34 AM:  T/C to Dr. Orvan Falconer, hospitalist case discussed, including:  HPI, pertinent PM/SHx, VS/PE, dx testing, ED course and treatment.  Agreeable to admission  Requests to write temporary orders, med surg  bed to Dr. Felecia Shelling. He asked that we obtain a CT of the hip with contrast.    MDM  Patient with right hip prosthetic here with pain to the hip and overlying cellulitis. Labs are unremarkable with an ongoing anemia.Given analgesic x 2 with some relief.Initiated antibiotic therapy. Blood cultures pending. Spoke with hospitalist to arrange admission.Pt stable in ED with no significant deterioration in condition.The patient appears reasonably stabilized for admission considering the current resources, flow, and capabilities available in the ED at this time, and I doubt any other [facility] requiring further screening and/or treatment in the ED prior to admission.  I personally performed the services described in this documentation, which was scribed in my presence. The recorded information has been reviewed and considered.   MDM Reviewed: nursing note, vitals and previous chart Reviewed previous: labs and x-ray Interpretation: x-ray, labs and CT scan            Nicoletta Dress. Colon Branch, MD 01/13/12 1610  Nicoletta Dress. Colon Branch, MD 01/13/12 [id number]

## 2012-01-12 NOTE — ED Notes (Signed)
Currently in [facility] s/p hip replacement for rehab. Pt states she noted increased pain and redness right hip this morning. Now hip red and tender to touch

## 2012-01-13 ENCOUNTER — Emergency Department (HOSPITAL_COMMUNITY): Payer: Medicare Other

## 2012-01-13 ENCOUNTER — Encounter (HOSPITAL_COMMUNITY): Payer: Self-pay

## 2012-01-13 DIAGNOSIS — K589 Irritable bowel syndrome without diarrhea: Secondary | ICD-10-CM | POA: Diagnosis present

## 2012-01-13 DIAGNOSIS — F29 Unspecified psychosis not due to a substance or known physiological condition: Secondary | ICD-10-CM | POA: Diagnosis present

## 2012-01-13 DIAGNOSIS — D638 Anemia in other chronic diseases classified elsewhere: Secondary | ICD-10-CM | POA: Diagnosis present

## 2012-01-13 DIAGNOSIS — L89324 Pressure ulcer of left buttock, stage 4: Secondary | ICD-10-CM | POA: Diagnosis present

## 2012-01-13 DIAGNOSIS — F329 Major depressive disorder, single episode, unspecified: Secondary | ICD-10-CM | POA: Diagnosis present

## 2012-01-13 DIAGNOSIS — L03115 Cellulitis of right lower limb: Secondary | ICD-10-CM | POA: Diagnosis present

## 2012-01-13 DIAGNOSIS — L02419 Cutaneous abscess of limb, unspecified: Principal | ICD-10-CM

## 2012-01-13 DIAGNOSIS — E876 Hypokalemia: Secondary | ICD-10-CM | POA: Diagnosis present

## 2012-01-13 DIAGNOSIS — G8929 Other chronic pain: Secondary | ICD-10-CM | POA: Diagnosis present

## 2012-01-13 DIAGNOSIS — L89309 Pressure ulcer of unspecified buttock, unspecified stage: Secondary | ICD-10-CM

## 2012-01-13 DIAGNOSIS — S52123A Displaced fracture of head of unspecified radius, initial encounter for closed fracture: Secondary | ICD-10-CM

## 2012-01-13 DIAGNOSIS — F32A Depression, unspecified: Secondary | ICD-10-CM | POA: Diagnosis present

## 2012-01-13 DIAGNOSIS — L8994 Pressure ulcer of unspecified site, stage 4: Secondary | ICD-10-CM

## 2012-01-13 DIAGNOSIS — M109 Gout, unspecified: Secondary | ICD-10-CM | POA: Diagnosis present

## 2012-01-13 LAB — BASIC METABOLIC PANEL
Calcium: 8.6 mg/dL (ref 8.4–10.5)
GFR calc Af Amer: >90 mL/min (ref >90)
GFR calc non Af Amer: >90 mL/min (ref >90)
Glucose, Bld: 91 mg/dL (ref 70–99)
Potassium: 3.2 mEq/L — ABNORMAL LOW (ref 3.5–5.1)
Sodium: 137 mEq/L (ref 135–145)

## 2012-01-13 LAB — CBC
MCH: 25.2 pg — ABNORMAL LOW (ref 26.0–34.0)
MCHC: 30.1 g/dL (ref 30.0–36.0)
Platelets: 539 10*3/uL — ABNORMAL HIGH (ref 150–400)
RDW: 16.9 % — ABNORMAL HIGH (ref 11.5–15.5)

## 2012-01-13 LAB — COMPREHENSIVE METABOLIC PANEL
ALT: 28 U/L (ref 0–35)
AST: 31 U/L (ref 0–37)
CO2: 29 mEq/L (ref 19–32)
Calcium: 9.2 mg/dL (ref 8.4–10.5)
Creatinine, Ser: 0.59 mg/dL (ref 0.50–1.10)
GFR calc Af Amer: >90 mL/min (ref >90)
GFR calc non Af Amer: >90 mL/min (ref >90)
Sodium: 136 mEq/L (ref 135–145)
Total Protein: 6.6 g/dL (ref 6.0–8.3)

## 2012-01-13 MED ORDER — ACETAMINOPHEN 325 MG PO TABS
650.0000 mg | ORAL_TABLET | ORAL | Status: DC | PRN
  Administered 2012-01-13 – 2012-01-16 (×3): 650 mg via ORAL
  Filled 2012-01-13 (×3): qty 2

## 2012-01-13 MED ORDER — ENOXAPARIN SODIUM 40 MG/0.4ML ~~LOC~~ SOLN
40.0000 mg | SUBCUTANEOUS | Status: DC
  Administered 2012-01-13 – 2012-01-16 (×4): 40 mg via SUBCUTANEOUS
  Filled 2012-01-13 (×4): qty 0.4

## 2012-01-13 MED ORDER — PANTOPRAZOLE SODIUM 40 MG PO TBEC
40.0000 mg | DELAYED_RELEASE_TABLET | Freq: Every day | ORAL | Status: DC
  Administered 2012-01-13 – 2012-01-16 (×4): 40 mg via ORAL
  Filled 2012-01-13 (×4): qty 1

## 2012-01-13 MED ORDER — DICYCLOMINE HCL 10 MG PO CAPS
10.0000 mg | ORAL_CAPSULE | Freq: Three times a day (TID) | ORAL | Status: DC
  Administered 2012-01-13 – 2012-01-16 (×13): 10 mg via ORAL
  Filled 2012-01-13 (×13): qty 1

## 2012-01-13 MED ORDER — HYDROMORPHONE HCL PF 1 MG/ML IJ SOLN
0.5000 mg | INTRAMUSCULAR | Status: DC | PRN
  Administered 2012-01-13 – 2012-01-16 (×8): 1 mg via INTRAVENOUS
  Filled 2012-01-13 (×8): qty 1

## 2012-01-13 MED ORDER — IOHEXOL 300 MG/ML  SOLN
100.0000 mL | Freq: Once | INTRAMUSCULAR | Status: AC | PRN
  Administered 2012-01-13: 100 mL via INTRAVENOUS

## 2012-01-13 MED ORDER — VANCOMYCIN HCL IN DEXTROSE 1-5 GM/200ML-% IV SOLN
1000.0000 mg | Freq: Once | INTRAVENOUS | Status: AC
  Administered 2012-01-13: 1000 mg via INTRAVENOUS
  Filled 2012-01-13: qty 200

## 2012-01-13 MED ORDER — ALPRAZOLAM 0.5 MG PO TABS
0.5000 mg | ORAL_TABLET | Freq: Three times a day (TID) | ORAL | Status: DC
  Administered 2012-01-13 – 2012-01-16 (×10): 0.5 mg via ORAL
  Filled 2012-01-13 (×10): qty 1

## 2012-01-13 MED ORDER — SIMVASTATIN 20 MG PO TABS
20.0000 mg | ORAL_TABLET | Freq: Every evening | ORAL | Status: DC
  Administered 2012-01-13 – 2012-01-15 (×3): 20 mg via ORAL
  Filled 2012-01-13 (×3): qty 1

## 2012-01-13 MED ORDER — VANCOMYCIN HCL IN DEXTROSE 1-5 GM/200ML-% IV SOLN
1000.0000 mg | Freq: Two times a day (BID) | INTRAVENOUS | Status: DC
  Administered 2012-01-13 – 2012-01-16 (×7): 1000 mg via INTRAVENOUS
  Filled 2012-01-13 (×9): qty 200

## 2012-01-13 MED ORDER — ACETAMINOPHEN 650 MG RE SUPP: 650.0000 mg | Freq: Four times a day (QID) | RECTAL | Status: DC | PRN

## 2012-01-13 MED ORDER — FLUOXETINE HCL 20 MG PO CAPS
60.0000 mg | ORAL_CAPSULE | Freq: Every day | ORAL | Status: DC
  Administered 2012-01-13 – 2012-01-16 (×4): 60 mg via ORAL
  Filled 2012-01-13 (×4): qty 3

## 2012-01-13 MED ORDER — CIPROFLOXACIN IN D5W 400 MG/200ML IV SOLN
INTRAVENOUS | Status: AC
  Filled 2012-01-13: qty 200

## 2012-01-13 MED ORDER — BUPROPION HCL ER (XL) 300 MG PO TB24
300.0000 mg | ORAL_TABLET | ORAL | Status: DC
  Administered 2012-01-13 – 2012-01-16 (×4): 300 mg via ORAL
  Filled 2012-01-13 (×6): qty 1

## 2012-01-13 MED ORDER — TRAZODONE HCL 50 MG PO TABS
25.0000 mg | ORAL_TABLET | Freq: Every evening | ORAL | Status: DC | PRN
  Administered 2012-01-16: 25 mg via ORAL
  Filled 2012-01-13 (×2): qty 1

## 2012-01-13 MED ORDER — FESOTERODINE FUMARATE ER 4 MG PO TB24
4.0000 mg | ORAL_TABLET | Freq: Every day | ORAL | Status: DC
  Administered 2012-01-13 – 2012-01-16 (×4): 4 mg via ORAL
  Filled 2012-01-13 (×6): qty 1

## 2012-01-13 MED ORDER — SODIUM CHLORIDE 0.9 % IV BOLUS (SEPSIS)
500.0000 mL | Freq: Once | INTRAVENOUS | Status: AC
  Administered 2012-01-13: 500 mL via INTRAVENOUS

## 2012-01-13 MED ORDER — ONDANSETRON HCL 4 MG/2ML IJ SOLN
4.0000 mg | INTRAMUSCULAR | Status: DC | PRN
  Filled 2012-01-13: qty 2

## 2012-01-13 MED ORDER — GABAPENTIN 300 MG PO CAPS
300.0000 mg | ORAL_CAPSULE | Freq: Three times a day (TID) | ORAL | Status: DC
  Administered 2012-01-13 – 2012-01-16 (×10): 300 mg via ORAL
  Filled 2012-01-13 (×14): qty 1

## 2012-01-13 MED ORDER — CIPROFLOXACIN IN D5W 400 MG/200ML IV SOLN
400.0000 mg | Freq: Two times a day (BID) | INTRAVENOUS | Status: DC
  Administered 2012-01-13 – 2012-01-16 (×7): 400 mg via INTRAVENOUS
  Filled 2012-01-13 (×8): qty 200

## 2012-01-13 MED ORDER — SODIUM CHLORIDE 0.9 % IJ SOLN
3.0000 mL | Freq: Two times a day (BID) | INTRAMUSCULAR | Status: DC
  Administered 2012-01-14 – 2012-01-16 (×3): 3 mL via INTRAVENOUS
  Filled 2012-01-13 (×3): qty 3

## 2012-01-13 MED ORDER — OLANZAPINE 5 MG PO TABS
5.0000 mg | ORAL_TABLET | Freq: Every day | ORAL | Status: DC
  Administered 2012-01-13 – 2012-01-15 (×3): 5 mg via ORAL
  Filled 2012-01-13 (×3): qty 1

## 2012-01-13 MED ORDER — OXYCODONE HCL 5 MG PO TABS
5.0000 mg | ORAL_TABLET | ORAL | Status: DC | PRN
  Administered 2012-01-13 – 2012-01-16 (×7): 5 mg via ORAL
  Filled 2012-01-13 (×7): qty 1

## 2012-01-13 MED ORDER — SACCHAROMYCES BOULARDII 250 MG PO CAPS
250.0000 mg | ORAL_CAPSULE | Freq: Two times a day (BID) | ORAL | Status: DC
  Administered 2012-01-13 – 2012-01-16 (×7): 250 mg via ORAL
  Filled 2012-01-13 (×7): qty 1

## 2012-01-13 MED ORDER — ASPIRIN EC 81 MG PO TBEC
81.0000 mg | DELAYED_RELEASE_TABLET | Freq: Every day | ORAL | Status: DC
  Administered 2012-01-13 – 2012-01-16 (×4): 81 mg via ORAL
  Filled 2012-01-13 (×4): qty 1

## 2012-01-13 MED ORDER — POTASSIUM CHLORIDE IN NACL 20-0.9 MEQ/L-% IV SOLN
INTRAVENOUS | Status: DC
  Administered 2012-01-13 – 2012-01-14 (×3): via INTRAVENOUS

## 2012-01-13 MED ORDER — MIRTAZAPINE 30 MG PO TABS
15.0000 mg | ORAL_TABLET | Freq: Every day | ORAL | Status: DC
  Administered 2012-01-13 – 2012-01-15 (×3): 15 mg via ORAL
  Filled 2012-01-13 (×4): qty 1

## 2012-01-13 MED ORDER — SODIUM CHLORIDE 0.45 % IV BOLUS
500.0000 mL | Freq: Once | INTRAVENOUS | Status: AC
  Administered 2012-01-13: 500 mL via INTRAVENOUS

## 2012-01-13 MED ORDER — FLEET ENEMA 7-19 GM/118ML RE ENEM: 1.0000 | ENEMA | Freq: Once | RECTAL | Status: AC | PRN

## 2012-01-13 MED ORDER — MORPHINE SULFATE 2 MG/ML IJ SOLN
2.0000 mg | Freq: Once | INTRAMUSCULAR | Status: AC
  Administered 2012-01-13: 2 mg via INTRAVENOUS
  Filled 2012-01-13: qty 1

## 2012-01-13 NOTE — H&P (Signed)
[facility] History and Physical  MARLYSS CISSELL  ZOX:[id number]  DOB: [birth year]   DOA: 01/13/2012   PCP:   Avon Gully, MD   Chief Complaint:  Redness of the right hip since today  HPI: [name] is an 66 y.o. female.  Caucasian lady resident of a skilled nursing, chronic medical illness and unable to give her own reliable history, sent from [facility] skilled nursing facility with new onset of redness of her right hip. Patient had right hip repair about 17 months ago, and was treated for an infection in the hip a few months ago but that has healed. She has had reportedly no fever or chills; she transfers over to a wheelchair and does not ambulate. Is no history of trauma to the area  Rewiew of Systems:   Unable to obtain a reliable history since patient changes her story frequently     Past Medical History  Diagnosis Date  . IBS (irritable bowel syndrome)   . Depression   . GERD (gastroesophageal reflux disease)   . Gout   . Chronic back pain   . Chronic right hip pain   . Chronic diarrhea   . Anxiety and depression     Past Surgical History  Procedure Date  . Hip arthroplasty 05/01/2011    Procedure: right ARTHROPLASTY BIPOLAR HIP;  Surgeon: Fuller Canada, MD;  Location: [facility];  Service: Orthopedics;  Laterality: Right;  . Incision and drainage of wound 05/24/2011    Procedure: IRRIGATION AND DEBRIDEMENT WOUND;  Surgeon: Fuller Canada, MD;  Location: [facility];  Service: Orthopedics;  Laterality: Right;    Medications:  HOME MEDS: Prior to Admission medications   Medication Sig Start Date End Date Taking? Authorizing Provider  ALPRAZolam Prudy Feeler) 0.5 MG tablet Take 1 tablet (0.5 mg total) by mouth 3 (three) times daily. 05/30/11  Yes Vickki Hearing, MD  buPROPion (WELLBUTRIN XL) 150 MG 24 hr tablet Take 300 mg by mouth every morning.   Yes Historical Provider, MD  dicyclomine (BENTYL) 10 MG capsule Take 1 capsule (10 mg total) by mouth 4 (four) times  daily -  before meals and at bedtime. 05/04/11  Yes Vickki Hearing, MD  fesoterodine (TOVIAZ) 4 MG TB24 Take 1 tablet (4 mg total) by mouth daily. 05/04/11  Yes Vickki Hearing, MD  FLUoxetine (PROZAC) 20 MG capsule Take 60 mg by mouth daily.   Yes Historical Provider, MD  gabapentin (NEURONTIN) 300 MG capsule Take 1 capsule (300 mg total) by mouth 3 (three) times daily. 12/05/11 12/04/12 Yes Vickki Hearing, MD  NORCO 5-325 MG per tablet TAKE (1) TABLET BY MOUTH EVERY FOUR HOURS AS NEEDED FOR PAIN. 09/13/11  Yes Vickki Hearing, MD  OLANZapine (ZYPREXA) 5 MG tablet Take 5 mg by mouth at bedtime.   Yes Historical Provider, MD  omeprazole (PRILOSEC) 20 MG capsule Take 20 mg by mouth daily.   Yes Historical Provider, MD  simvastatin (ZOCOR) 20 MG tablet Take 20 mg by mouth every evening.   Yes Historical Provider, MD  loperamide (IMODIUM) 2 MG capsule Take 2-4 mg by mouth 4 (four) times daily as needed. For diarrhea, take 2 tablets (4mg ) initially then take 2 mg after each loose stool    Historical Provider, MD  mirtazapine (REMERON) 15 MG tablet Take 15 mg by mouth at bedtime.    Historical Provider, MD  PredniSONE 10 MG KIT Take 1 kit (10 mg total) by mouth as directed. 12/05/11   Fernande Boyden  Romeo Apple, MD     Allergies:  Allergies  Allergen Reactions  . Diphenhydramine Other (See Comments)    Feels bad    Social History:   reports that she has never smoked. She does not have any smokeless tobacco history on file. She reports that she does not drink alcohol or use illicit drugs.  Family History: History reviewed. No pertinent family history. Family history unknown  Physical Exam: Filed Vitals:   01/12/12 2244 01/13/12 0203  BP: 97/44 94/56  Pulse: 90 79  Temp: 99.5 F (37.5 C)   TempSrc: Oral   Resp: 20 14  SpO2: 97% 94%   Blood pressure 94/56, pulse 79, temperature 99.5 F (37.5 C), temperature source Oral, resp. rate 14, SpO2 94.00%.  GEN:  Pleasant elderly Caucasian  lady lying in the stretcher in no acute distress; cooperative with exam PSYCH:  alert and oriented x2; does not appear anxious or depressed; affect is flat HEENT: Mucous membranes pink , dry and anicteric; PERRLA; EOM intact; no cervical lymphadenopathy nor thyromegaly or carotid bruit; no JVD; Breasts:: Not examined CHEST WALL: No tenderness CHEST: Normal respiration, clear to auscultation bilaterally HEART: Regular rate and rhythm; no murmurs rubs or gallops BACK: No kyphosis or scoliosis; no CVA tenderness ABDOMEN: Obese, soft non-tender; no masses, no organomegaly, normal abdominal bowel sounds; no pannus; no intertriginous candida. Rectal Exam: Not done EXTREMITIES: Erythema the entire right hip and thigh; well-healed scars from previous surgery; age-appropriate arthropathy of the hands and knees; no edema; arthropathy of the hands, knees and ankles. No edema Genitalia: not examined PULSES: 2+ and symmetric SKIN: Normal hydration; 2-3 cm  stage IV ulcer of the left buttock, yellow drainage on dressing. CNS: Cranial nerves 2-12 grossly intact no focal lateralizing neurologic deficit, the patient is paraplegic from orthopedic issues.   Labs on Admission:  Basic Metabolic Panel:  Lab 01/12/12 1610  NA 136  K 3.4*  CL 99  CO2 29  GLUCOSE 94  BUN 8  CREATININE 0.59  CALCIUM 9.2  MG --  PHOS --   Liver Function Tests:  Lab 01/12/12 2320  AST 31  ALT 28  ALKPHOS 190*  BILITOT 0.1*  PROT 6.6  ALBUMIN 2.1*   No results found for this basename: LIPASE:5,AMYLASE:5 in the last 168 hours No results found for this basename: AMMONIA:5 in the last 168 hours CBC:  Lab 01/12/12 2320  WBC 8.9  NEUTROABS 6.7  HGB 9.6*  HCT 31.9*  MCV 83.1  PLT 598*   Cardiac Enzymes: No results found for this basename: CKTOTAL:5,CKMB:5,CKMBINDEX:5,TROPONINI:5 in the last 168 hours BNP: No components found with this basename: POCBNP:5 D-dimer: No components found with this basename:  D-DIMER:5 CBG: No results found for this basename: GLUCAP:5 in the last 168 hours  Radiological Exams on Admission: Dg Hip Complete Right  01/13/2012  *RADIOLOGY REPORT*  Clinical Data: Erythema and discomfort status post total hip replacement  RIGHT HIP - COMPLETE 2+ VIEW  Comparison: 10/13/2011  Findings: Status post right total hip arthroplasty.  The acetabular component of the prosthesis has unchanged in orientation when compared to the prior study.  The acetabular component in now more vertical in orientation, with subsidence/erosion into the overlying pelvis.  Associated heterotopic ossification.  No fracture is seen.  No definite loosening.  Secondary foreshortening of the right proximal femur.  IMPRESSION: Status post right total hip arthroplasty.  Acetabular component of the prosthesis is now more vertical in orientation with its side/erosion into the overlying pelvis.  No  fracture is seen.   Original Report Authenticated By: Charline Bills, M.D.    Ct Hip Right W Contrast  01/13/2012  *RADIOLOGY REPORT*  Clinical Data: Right hip replacement.  Redness, pain, cellulitis.  CT OF THE RIGHT HIP WITH CONTRAST  Technique:  Multidetector CT imaging was performed following the standard protocol during bolus administration of intravenous contrast.  Contrast: OMNIPAQUE IOHEXOL 300 MG/ML  SOLN  Comparison: Right hip radiographs 01/12/2012.  Findings: Right hip arthroplasty with noncemented femoral and acetabular components.  Beam hardening artifact arising from the hardware limits visualization of relevant anatomy and some portions of the study.  Heterotopic ossification lateral to the hip prosthesis.  Bone demineralization.  No definite cortical break, bone erosion, or periosteal reaction in the visualized areas.  Subcutaneous soft tissue infiltration of the fat around the right hip extending from anterior to bilaterally.  Suggestion of skin thickening.  Changes are consistent with either edema or  cellulitis.  No discrete fluid collection or contrast enhancement is demonstrated that would suggest an abscess.  IMPRESSION: Right hip arthroplasty.  Infiltration in the subcutaneous fat and skin thickening around the right hip consistent with cellulitis or edema.  No discrete abscess demonstrated.   Original Report Authenticated By: Marlon Pel, M.D.       Assessment/Plan Present on Admission:  .Cellulitis of right hip .Psychosis .Depression .Gout .Chronic pain .Decubitus ulcer of left buttock, stage 4 .IBS (irritable bowel syndrome) .Hypokalemia .Anemia, chronic disease   PLAN: Will start this lady on broad-spectrum intravenous antibiotic therapy pending results of blood cultures;  Replete potassium;  wound consult;  Other plans as per orders.  Her primary physician's team will assume care later this morning.  Code Status: FULL CODE  Family Communication: She is is a ward of the state Disposition Plan: Back to [facility] skilled nursing facility once infection controlled.   Caela Huot Nocturnist [facility] Pager [phone]   01/13/2012, 4:08 AM

## 2012-01-13 NOTE — Progress Notes (Signed)
ANTIBIOTIC CONSULT NOTE - INITIAL  Pharmacy Consult for vancomycin Indication: hip cellulitis  Allergies  Allergen Reactions  . Diphenhydramine Other (See Comments)    Feels bad    Patient Measurements: Height: 5\' 6"  (167.6 cm) Weight: 132 lb 15 oz (60.3 kg) IBW/kg (Calculated) : 59.3    Vital Signs: Temp: 98.7 F (37.1 C) (10/20 0450) Temp src: Oral (10/20 0450) BP: 81/42 mmHg (10/20 0550) Pulse Rate: 81  (10/20 0450) Intake/Output from previous day:   Intake/Output from this shift:    Labs:  Basename 01/13/12 0517 01/12/12 2320  WBC 8.7 8.9  HGB 9.1* 9.6*  PLT 539* 598*  LABCREA -- --  CREATININE 0.58 0.59   Estimated Creatinine Clearance: 63.9 ml/min (by C-G formula based on Cr of 0.58). No results found for this basename: VANCOTROUGH:2,VANCOPEAK:2,VANCORANDOM:2,GENTTROUGH:2,GENTPEAK:2,GENTRANDOM:2,TOBRATROUGH:2,TOBRAPEAK:2,TOBRARND:2,AMIKACINPEAK:2,AMIKACINTROU:2,AMIKACIN:2, in the last 72 hours   Microbiology: Recent Results (from the past 720 hour(s))  CULTURE, BLOOD (ROUTINE X 2)     Status: Normal (Preliminary result)   Collection Time   01/12/12 11:21 PM      Component Value Range Status Comment   Specimen Description Blood LEFT ANTECUBITAL   Final    Special Requests BOTTLES DRAWN AEROBIC AND ANAEROBIC 8CC   Final    Culture NO GROWTH 1 DAY   Final    Report Status PENDING   Incomplete   CULTURE, BLOOD (ROUTINE X 2)     Status: Normal (Preliminary result)   Collection Time   01/12/12 11:45 PM      Component Value Range Status Comment   Specimen Description Blood RIGHT ANTECUBITAL   Final    Special Requests     Final    Value: BOTTLES DRAWN AEROBIC AND ANAEROBIC 8CC DRAWN BY RN   Culture NO GROWTH 1 DAY   Final    Report Status PENDING   Incomplete     Medical History: Past Medical History  Diagnosis Date  . IBS (irritable bowel syndrome)   . Depression   . GERD (gastroesophageal reflux disease)   . Gout   . Chronic back pain   . Chronic  right hip pain   . Chronic diarrhea   . Anxiety and depression     Medications:  Scheduled:    . sodium chloride   Intravenous Once  . ALPRAZolam  0.5 mg Oral TID  . aspirin EC  81 mg Oral Daily  . buPROPion  300 mg Oral BH-q7a  . ciprofloxacin  400 mg Intravenous Q12H  . dicyclomine  10 mg Oral TID AC & HS  . enoxaparin (LOVENOX) injection  40 mg Subcutaneous Q24H  . fesoterodine  4 mg Oral Daily  . FLUoxetine  60 mg Oral Daily  . gabapentin  300 mg Oral TID  . mirtazapine  15 mg Oral QHS  . morphine  2 mg Intravenous Once  . morphine  2 mg Intravenous Once  . OLANZapine  5 mg Oral QHS  . ondansetron  4 mg Intravenous Once  . pantoprazole  40 mg Oral Daily  . saccharomyces boulardii  250 mg Oral BID  . simvastatin  20 mg Oral QPM  . sodium chloride  3 mL Intravenous Q12H  . vancomycin  1,000 mg Intravenous Once  . vancomycin  1,000 mg Intravenous Q12H   Assessment: Pt is a 66 yo female presenting with right hip pain.  Skin on right hip is erythematous and warm.  Cellulitis extends from upper thigh to the hip.   Goal of Therapy:  Vancomycin  trough level 10-15 mcg/ml  Plan:  Vancomycin 1000 mg IV Q 12 hours.  Measure antibiotic drug levels at steady state  Wilson, [employer] 01/13/2012,7:15 AM

## 2012-01-13 NOTE — Progress Notes (Signed)
Patient has low blood pressure.  Notified Dr. Sudie Bailey of patient's low blood pressure.  At beginning of shift patient had pus pocket that burst and bloody purulent drainage from right hip.  Obtained wound culture per md order.  Patient in stable condition, alert and oriented x 3, no tachycardic and on no oxygen at this time.  Gave patient normal saline bolus per md order, will continue to monitor patient.

## 2012-01-13 NOTE — Progress Notes (Signed)
Subjective: Patient was admitted from [facility] nursing home due to diffuse redness and wound of rt hip. She is started treatment for celluitis.  Objective: Vital signs in last 24 hours: Temp:  [98.7 F (37.1 C)-99.7 F (37.6 C)] 99.5 F (37.5 C) (10/20 1402) Pulse Rate:  [79-90] 87  (10/20 1402) Resp:  [14-20] 18  (10/20 1402) BP: (74-108)/(41-56) 89/48 mmHg (10/20 1402) SpO2:  [94 %-97 %] 97 % (10/20 1402) Weight:  [60.3 kg (132 lb 15 oz)] 60.3 kg (132 lb 15 oz) (10/20 0450) Weight change:  Last BM Date: 01/11/12  Intake/Output from previous day:    PHYSICAL EXAM General appearance: alert and no distress Resp: clear to auscultation bilaterally Cardio: S1, S2 normal GI: soft, non-tender; bowel sounds normal; no masses,  no organomegaly Extremities: necrotic wound and diffuse redness of the rt hip  Lab Results:    @labtest @ [facility] No results found for this basename: PHART,PCO2,PO2ART,TCO2,HCO3 in the last 72 hours CULTURES Recent Results (from the past 240 hour(s))  CULTURE, BLOOD (ROUTINE X 2)     Status: Normal (Preliminary result)   Collection Time   01/12/12 11:21 PM      Component Value Range Status Comment   Specimen Description Blood LEFT ANTECUBITAL   Final    Special Requests BOTTLES DRAWN AEROBIC AND ANAEROBIC 8CC   Final    Culture NO GROWTH 1 DAY   Final    Report Status PENDING   Incomplete   CULTURE, BLOOD (ROUTINE X 2)     Status: Normal (Preliminary result)   Collection Time   01/12/12 11:45 PM      Component Value Range Status Comment   Specimen Description Blood RIGHT ANTECUBITAL   Final    Special Requests     Final    Value: BOTTLES DRAWN AEROBIC AND ANAEROBIC 8CC DRAWN BY RN   Culture NO GROWTH 1 DAY   Final    Report Status PENDING   Incomplete    Studies/Results: Dg Hip Complete Right  01/13/2012  *RADIOLOGY REPORT*  Clinical Data: Erythema and discomfort status post total hip replacement  RIGHT HIP - COMPLETE 2+ VIEW  Comparison: 10/13/2011   Findings: Status post right total hip arthroplasty.  The acetabular component of the prosthesis has unchanged in orientation when compared to the prior study.  The acetabular component in now more vertical in orientation, with subsidence/erosion into the overlying pelvis.  Associated heterotopic ossification.  No fracture is seen.  No definite loosening.  Secondary foreshortening of the right proximal femur.  IMPRESSION: Status post right total hip arthroplasty.  Acetabular component of the prosthesis is now more vertical in orientation with its side/erosion into the overlying pelvis.  No fracture is seen.   Original Report Authenticated By: Charline Bills, M.D.    Ct Hip Right W Contrast  01/13/2012  *RADIOLOGY REPORT*  Clinical Data: Right hip replacement.  Redness, pain, cellulitis.  CT OF THE RIGHT HIP WITH CONTRAST  Technique:  Multidetector CT imaging was performed following the standard protocol during bolus administration of intravenous contrast.  Contrast: OMNIPAQUE IOHEXOL 300 MG/ML  SOLN  Comparison: Right hip radiographs 01/12/2012.  Findings: Right hip arthroplasty with noncemented femoral and acetabular components.  Beam hardening artifact arising from the hardware limits visualization of relevant anatomy and some portions of the study.  Heterotopic ossification lateral to the hip prosthesis.  Bone demineralization.  No definite cortical break, bone erosion, or periosteal reaction in the visualized areas.  Subcutaneous soft tissue infiltration of the  fat around the right hip extending from anterior to bilaterally.  Suggestion of skin thickening.  Changes are consistent with either edema or cellulitis.  No discrete fluid collection or contrast enhancement is demonstrated that would suggest an abscess.  IMPRESSION: Right hip arthroplasty.  Infiltration in the subcutaneous fat and skin thickening around the right hip consistent with cellulitis or edema.  No discrete abscess demonstrated.    Original Report Authenticated By: Marlon Pel, M.D.     Medications: I have reviewed the patient's current medications.  Assesment: Principal Problem:  *Cellulitis of right hip Active Problems:  Psychosis  Depression  Gout  Chronic pain  Decubitus ulcer of left buttock, stage 4  IBS (irritable bowel syndrome)  Hypokalemia  Anemia, chronic disease Rt hip wound (pressure wound) Plan: Continue current treatment IV antibiotics Wound consult.    LOS: 1 day   Keshauna Degraffenreid 01/13/2012, 2:44 PM

## 2012-01-13 NOTE — Progress Notes (Signed)
Patient blood pressure still below normal limits.  Notified dr. Sudie Bailey again and received order for another bolus.  Will continue to watch patient's blood pressure closely.

## 2012-01-14 LAB — CBC
Hemoglobin: 8.7 g/dL — ABNORMAL LOW (ref 12.0–15.0)
MCHC: 30.1 g/dL (ref 30.0–36.0)
RDW: 17 % — ABNORMAL HIGH (ref 11.5–15.5)
WBC: 7.9 10*3/uL (ref 4.0–10.5)

## 2012-01-14 LAB — BASIC METABOLIC PANEL
GFR calc Af Amer: >90 mL/min (ref >90)
GFR calc non Af Amer: >90 mL/min (ref >90)
Potassium: 3.9 mEq/L (ref 3.5–5.1)
Sodium: 146 mEq/L — ABNORMAL HIGH (ref 135–145)

## 2012-01-14 MED ORDER — SODIUM CHLORIDE 0.9 % IJ SOLN
INTRAMUSCULAR | Status: AC
  Administered 2012-01-14: 3 mL
  Filled 2012-01-14: qty 3

## 2012-01-14 NOTE — Consult Note (Signed)
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  Acute inflammation and suspected infection on right hip with cellulitis Wound type:Pressure, infectious Pressure Ulcer POA: Yes (left ischial) Measurement:2cm x 2.5cm x 1cm, with 80% pink, moist base; 20% yellow slough. Right hip over previous incision line with erythema, ruptured blister (last pm) site measuring 3cm x 1cm x .2cm.  Some fluctuance, but I am not able to express any fluid tonight. Wound bed:As above Drainage (amount, consistency, odor) Serous from left ischial; serosanguinous from right hip. Periwound: right hip with periwound erythema Dressing procedure/placement/frequency:Saline dressings twice daily to right hip; silver hydrofiber to left ischial pressure ulcer, change M-W-F. Suggest surgical (probably ortho) consult and evaluation of right hip to rule out infectious process involving joint/previous surgical site. A low air loss therapeutic sleep surface is provided as patient has two sleep surfaces involved and is unable to lie on right side. The right hip is beyond the scope for WOC nursing practice. I will remain available to this patient and her medical and nursing teams, but will not follow. Please re-consult if needed. Thanks, Ladona Mow, MSN, RN, [facility], CWOCN [phone])

## 2012-01-14 NOTE — Progress Notes (Signed)
ANTIBIOTIC CONSULT NOTE Pharmacy Consult for vancomycin Indication: hip cellulitis  Allergies  Allergen Reactions  . Diphenhydramine Other (See Comments)    Feels bad    Patient Measurements: Height: 5\' 6"  (167.6 cm) Weight: 133 lb 14.4 oz (60.737 kg) IBW/kg (Calculated) : 59.3    Vital Signs: Temp: 98.3 F (36.8 C) (10/21 0500) Temp src: Oral (10/21 0500) BP: 108/67 mmHg (10/21 0944) Pulse Rate: 62  (10/21 0944) Intake/Output from previous day: 10/20 0701 - 10/21 0700 In: 2591.7 [P.O.:220; I.V.:2371.7] Out: 2600 [Urine:2600] Intake/Output from this shift: Total I/O In: -  Out: 800 [Urine:800]  Labs:  [facility] 01/14/12 0502 01/13/12 0517 01/12/12 2320  WBC 7.9 8.7 8.9  HGB 8.7* 9.1* 9.6*  PLT 492* 539* 598*  LABCREA -- -- --  CREATININE 0.59 0.58 0.59   Estimated Creatinine Clearance: 63.9 ml/min (by C-G formula based on Cr of 0.59).  Basename 01/14/12 1248  VANCOTROUGH 13.6  VANCOPEAK --  VANCORANDOM --  GENTTROUGH --  GENTPEAK --  GENTRANDOM --  TOBRATROUGH --  TOBRAPEAK --  TOBRARND --  AMIKACINPEAK --  AMIKACINTROU --  AMIKACIN --     Microbiology: Recent Results (from the past 720 hour(s))  CULTURE, BLOOD (ROUTINE X 2)     Status: Normal (Preliminary result)   Collection Time   01/12/12 11:21 PM      Component Value Range Status Comment   Specimen Description Blood LEFT ANTECUBITAL   Final    Special Requests BOTTLES DRAWN AEROBIC AND ANAEROBIC 8CC   Final    Culture NO GROWTH 2 DAYS   Final    Report Status PENDING   Incomplete   CULTURE, BLOOD (ROUTINE X 2)     Status: Normal (Preliminary result)   Collection Time   01/12/12 11:45 PM      Component Value Range Status Comment   Specimen Description Blood RIGHT ANTECUBITAL   Final    Special Requests     Final    Value: BOTTLES DRAWN AEROBIC AND ANAEROBIC 8CC DRAWN BY RN   Culture NO GROWTH 2 DAYS   Final    Report Status PENDING   Incomplete     Medical History: Past Medical History   Diagnosis Date  . IBS (irritable bowel syndrome)   . Depression   . GERD (gastroesophageal reflux disease)   . Gout   . Chronic back pain   . Chronic right hip pain   . Chronic diarrhea   . Anxiety and depression     Medications:  Scheduled:     . ALPRAZolam  0.5 mg Oral TID  . aspirin EC  81 mg Oral Daily  . buPROPion  300 mg Oral BH-q7a  . ciprofloxacin  400 mg Intravenous Q12H  . dicyclomine  10 mg Oral TID AC & HS  . enoxaparin (LOVENOX) injection  40 mg Subcutaneous Q24H  . fesoterodine  4 mg Oral Daily  . FLUoxetine  60 mg Oral Daily  . gabapentin  300 mg Oral TID  . mirtazapine  15 mg Oral QHS  . OLANZapine  5 mg Oral QHS  . pantoprazole  40 mg Oral Daily  . saccharomyces boulardii  250 mg Oral BID  . simvastatin  20 mg Oral QPM  . sodium chloride  500 mL Intravenous Once  . sodium chloride  500 mL Intravenous Once  . sodium chloride  3 mL Intravenous Q12H  . vancomycin  1,000 mg Intravenous Q12H   Assessment: Pt is a 66 yo female admitted  with cellulitis of hip currently on day#2 Vancomycin & Cipro.   Renal function at baseline & Vancomycin trough in goal range. However, pt still complaining of hip pain & spiking temp. Cx data pending.   Goal of Therapy:  Vancomycin trough level 10-15 mcg/ml  Plan:  Continue Vancomycin 1000 mg IV Q 12 hours.  Weekly vancomycin trough while on Vanc  Monitor renal function, cx data & pt progress  Abdulahi Schor, [facility] 01/14/2012,1:44 PM

## 2012-01-14 NOTE — Clinical Social Work Psychosocial (Signed)
     Clinical Social Work Department BRIEF PSYCHOSOCIAL ASSESSMENT 01/14/2012  Patient:  [name], [name]     Account Number:  [id number]     Admit date:  01/12/2012  Clinical Social Worker:  Robin Searing  Date/Time:  01/14/2012 12:24 PM  Referred by:  RN  Date Referred:  01/14/2012 Referred for  SNF Placement   Other Referral:   Interview type:  Patient Other interview type:    PSYCHOSOCIAL DATA Living Status:  FACILITY Admitted from facility:  [facility] Level of care:  Skilled Nursing Facility Primary support name:  Guardian Sales executive) Primary support relationship to patient:   Degree of support available:    CURRENT CONCERNS Current Concerns  Post-Acute Placement   Other Concerns:    SOCIAL WORK ASSESSMENT / PLAN Patient admitted from [facility] where she states she has been for about 1 week- prior to that she reports being at [facility].  Patient will rrequire SNF at d/c and I am attempting to reach her Guardian [facility] per [facility]) to confirm this plan.   Assessment/plan status:  Other - See comment Other assessment/ plan:   Will update FL2 and make contact with SNF and Guardian to confirm plans   Information/referral to community resources:   SNF    PATIENTS/FAMILYS RESPONSE TO PLAN OF CARE: Patient agreeable to plans- confirmation with Guardian is pending.

## 2012-01-14 NOTE — Evaluation (Signed)
noPhysical Therapy Evaluation Patient Details Name: [name] MRN: [id number] DOB: December 30, 1945 Today's Date: 01/14/2012 Time: 1191-4782 PT Time Calculation (min): 58 min  PT Assessment / Plan / Recommendation Clinical Impression  Pt very fearful of falling but does cooperate with therapist.  PT will need skilled PT to improve I and saftey of ambulation as well as strength of LE    PT Assessment  Patient needs continued PT services    Follow Up Recommendations  Other (comment) (SNF)    Does the patient have the potential to tolerate intense rehabilitation    no  Barriers to Discharge  none      Equipment Recommendations  None recommended by PT    Recommendations for Other Services   none  Frequency Min 3X/week    Precautions / Restrictions Precautions Precautions: Fall;Anterior Hip (bipolar hip in Feb. 2013 complicated with infection.) Precaution Booklet Issued: No   Pertinent Vitals/Pain 10/10      Mobility  Bed Mobility Bed Mobility: Rolling Right;Rolling Left;Sit to Supine;Supine to Sit Rolling Left: 3: Mod assist Supine to Sit: 2: Max assist Sit to Supine: 3: Mod assist Transfers Transfers: Sit to Stand;Stand to Sit Sit to Stand: 3: Mod assist Stand to Sit: 3: Mod assist Details for Transfer Assistance: only able to stand for 30 seconds before pt began crying out.  Pt woud not put wieght on her right LE Ambulation/Gait Ambulation/Gait Assistance: Not tested (comment)       Exercises Total Joint Exercises Ankle Circles/Pumps: Strengthening;Both;10 reps Quad Sets: Strengthening;Both;10 reps Gluteal Sets: Strengthening;Both;10 reps Heel Slides: AAROM;Both;5 reps Hip ABduction/ADduction: AAROM;Strengthening;Both;10 reps   PT Diagnosis: Difficulty walking;Generalized weakness;Acute pain  PT Problem List: Decreased strength;Decreased activity tolerance;Decreased balance;Decreased mobility;Pain PT Treatment Interventions: Gait training;Therapeutic  exercise;Therapeutic activities;Functional mobility training   PT Goals Acute Rehab PT Goals PT Goal Formulation: With patient Time For Goal Achievement: 01/16/12 Potential to Achieve Goals: Good Pt will Roll Supine to Right Side: with mod assist PT Goal: Rolling Supine to Right Side - Progress: Goal set today Pt will Roll Supine to Left Side: with min assist PT Goal: Rolling Supine to Left Side - Progress: Goal set today Pt will go Supine/Side to Sit: with mod assist PT Goal: Supine/Side to Sit - Progress: Goal set today Pt will go Sit to Supine/Side: with mod assist PT Goal: Sit to Supine/Side - Progress: Goal set today Pt will go Stand to Sit: with min assist PT Goal: Stand to Sit - Progress: Goal set today Pt will Transfer Bed to Chair/Chair to Bed: with max assist PT Transfer Goal: Bed to Chair/Chair to Bed - Progress: Goal set today  Visit Information  Last PT Received On: 01/14/12    Subjective Data  Subjective: Ms. Melnyk states that she is having pain in her hip.  She states she can not stand. Patient Stated Goal: Pt wants to be able to walk again.   Prior Functioning  Home Living Lives With:  ([facility]) Available Help at Discharge: Skilled Nursing Facility Type of Home: Independent living facility Home Access: Level entry Home Layout: One level Bathroom Shower/Tub: Health visitor: Standard Home Adaptive Equipment: Walker - rolling;Wheelchair - manual Additional Comments: Pt states that she was walking with assistance at the facility where she resides.  She states she would walk with a walker and someone would have a wheelchair behind her.  Prior Function Level of Independence: Needs assistance Needs Assistance: Gait;Meal Prep;Light Housekeeping Meal Prep: Maximal Light Housekeeping: Maximal Gait Assistance:  SBA; pt states she has had trouble dressing but she had to do this by herself.   Able to Take Stairs?: No Driving:  No Vocation: Retired Musician: No difficulties    Cognition  Overall Cognitive Status: Appears within functional limits for tasks assessed/performed Arousal/Alertness: Awake/alert Orientation Level: Appears intact for tasks assessed Behavior During Session: [facility] for tasks performed          End of Session PT - End of Session Equipment Utilized During Treatment: Gait belt Activity Tolerance: Patient tolerated treatment well Patient left: in bed;with call bell/phone within reach  GP     RUSSELL,CINDY 01/14/2012, 11:48 AM

## 2012-01-14 NOTE — Progress Notes (Signed)
Spoke with [facility] and have been advised that the patient's Guardian is [name] [phone]. Contacted her and left message for return call to discuss patient and d/c plans.  Reece Levy, MSW, Theresia Majors [phone]

## 2012-01-14 NOTE — Progress Notes (Signed)
Subjective: Patient complains of pain in her rt hip. No fever or chills. No chest pain.  Objective: Vital signs in last 24 hours: Temp:  [98.2 F (36.8 C)-101.1 F (38.4 C)] 98.3 F (36.8 C) (10/21 0500) Pulse Rate:  [75-91] 75  (10/21 0500) Resp:  [16-18] 16  (10/21 0500) BP: (72-92)/(33-55) 89/47 mmHg (10/21 0500) SpO2:  [93 %-98 %] 98 % (10/21 0500) Weight:  [60.737 kg (133 lb 14.4 oz)] 60.737 kg (133 lb 14.4 oz) (10/21 0500) Weight change: 0.437 kg (15.4 oz) Last BM Date: 01/11/12  Intake/Output from previous day: 10/20 0701 - 10/21 0700 In: 2591.7 [P.O.:220; I.V.:2371.7] Out: 2600 [Urine:2600]  PHYSICAL EXAM General appearance: alert and no distress Resp: clear to auscultation bilaterally Cardio: S1, S2 normal GI: soft, non-tender; bowel sounds normal; no masses,  no organomegaly Extremities: necrotic wound and diffuse redness of the rt hip  Lab Results:    @labtest @ [facility] No results found for this basename: PHART,PCO2,PO2ART,TCO2,HCO3 in the last 72 hours CULTURES Recent Results (from the past 240 hour(s))  CULTURE, BLOOD (ROUTINE X 2)     Status: Normal (Preliminary result)   Collection Time   01/12/12 11:21 PM      Component Value Range Status Comment   Specimen Description Blood LEFT ANTECUBITAL   Final    Special Requests BOTTLES DRAWN AEROBIC AND ANAEROBIC 8CC   Final    Culture NO GROWTH 1 DAY   Final    Report Status PENDING   Incomplete   CULTURE, BLOOD (ROUTINE X 2)     Status: Normal (Preliminary result)   Collection Time   01/12/12 11:45 PM      Component Value Range Status Comment   Specimen Description Blood RIGHT ANTECUBITAL   Final    Special Requests     Final    Value: BOTTLES DRAWN AEROBIC AND ANAEROBIC 8CC DRAWN BY RN   Culture NO GROWTH 1 DAY   Final    Report Status PENDING   Incomplete    Studies/Results: Dg Hip Complete Right  01/13/2012  *RADIOLOGY REPORT*  Clinical Data: Erythema and discomfort status post total hip replacement   RIGHT HIP - COMPLETE 2+ VIEW  Comparison: 10/13/2011  Findings: Status post right total hip arthroplasty.  The acetabular component of the prosthesis has unchanged in orientation when compared to the prior study.  The acetabular component in now more vertical in orientation, with subsidence/erosion into the overlying pelvis.  Associated heterotopic ossification.  No fracture is seen.  No definite loosening.  Secondary foreshortening of the right proximal femur.  IMPRESSION: Status post right total hip arthroplasty.  Acetabular component of the prosthesis is now more vertical in orientation with its side/erosion into the overlying pelvis.  No fracture is seen.   Original Report Authenticated By: Charline Bills, M.D.    Ct Hip Right W Contrast  01/13/2012  *RADIOLOGY REPORT*  Clinical Data: Right hip replacement.  Redness, pain, cellulitis.  CT OF THE RIGHT HIP WITH CONTRAST  Technique:  Multidetector CT imaging was performed following the standard protocol during bolus administration of intravenous contrast.  Contrast: OMNIPAQUE IOHEXOL 300 MG/ML  SOLN  Comparison: Right hip radiographs 01/12/2012.  Findings: Right hip arthroplasty with noncemented femoral and acetabular components.  Beam hardening artifact arising from the hardware limits visualization of relevant anatomy and some portions of the study.  Heterotopic ossification lateral to the hip prosthesis.  Bone demineralization.  No definite cortical break, bone erosion, or periosteal reaction in the visualized areas.  Subcutaneous soft tissue infiltration of the fat around the right hip extending from anterior to bilaterally.  Suggestion of skin thickening.  Changes are consistent with either edema or cellulitis.  No discrete fluid collection or contrast enhancement is demonstrated that would suggest an abscess.  IMPRESSION: Right hip arthroplasty.  Infiltration in the subcutaneous fat and skin thickening around the right hip consistent with  cellulitis or edema.  No discrete abscess demonstrated.   Original Report Authenticated By: Marlon Pel, M.D.     Medications: I have reviewed the patient's current medications.  Assesment: Principal Problem:  *Cellulitis of right hip Active Problems:  Psychosis  Depression  Gout  Chronic pain  Decubitus ulcer of left buttock, stage 4  IBS (irritable bowel syndrome)  Hypokalemia  Anemia, chronic disease Rt hip wound (pressure wound) Plan: Continue current treatment IV antibiotics Wound consult.    LOS: 2 days   Kion Huntsberry 01/14/2012, 7:45 AM

## 2012-01-15 ENCOUNTER — Encounter (HOSPITAL_COMMUNITY): Payer: Self-pay

## 2012-01-15 NOTE — Progress Notes (Signed)
Subjective: Patient feels better. She is receiving iv antibiotics and wound care.  Objective: Vital signs in last 24 hours: Temp:  [98.8 F (37.1 C)-100.1 F (37.8 C)] 99.2 F (37.3 C) (10/22 0456) Pulse Rate:  [62-78] 74  (10/22 0456) Resp:  [18-20] 20  (10/22 0456) BP: (96-110)/(50-72) 110/72 mmHg (10/22 0456) SpO2:  [91 %-97 %] 94 % (10/22 0456) Weight:  [59.557 kg (131 lb 4.8 oz)] 59.557 kg (131 lb 4.8 oz) (10/22 0456) Weight change: -1.179 kg (-2 lb 9.6 oz) Last BM Date: 01/11/12  Intake/Output from previous day: 10/21 0701 - 10/22 0700 In: 2263.3 [P.O.:120; I.V.:743.3; IV Piggyback:1400] Out: 2600 [Urine:2600]  PHYSICAL EXAM General appearance: alert and no distress Resp: clear to auscultation bilaterally Cardio: S1, S2 normal GI: soft, non-tender; bowel sounds normal; no masses,  no organomegaly Extremities: necrotic wound and diffuse redness of the rt hip  Lab Results:    @labtest @ ABGS No results found for this basename: PHART,PCO2,PO2ART,TCO2,HCO3 in the last 72 hours CULTURES Recent Results (from the past 240 hour(s))  CULTURE, BLOOD (ROUTINE X 2)     Status: Normal (Preliminary result)   Collection Time   01/12/12 11:21 PM      Component Value Range Status Comment   Specimen Description Blood LEFT ANTECUBITAL   Final    Special Requests BOTTLES DRAWN AEROBIC AND ANAEROBIC 8CC   Final    Culture NO GROWTH 2 DAYS   Final    Report Status PENDING   Incomplete   CULTURE, BLOOD (ROUTINE X 2)     Status: Normal (Preliminary result)   Collection Time   01/12/12 11:45 PM      Component Value Range Status Comment   Specimen Description Blood RIGHT ANTECUBITAL   Final    Special Requests     Final    Value: BOTTLES DRAWN AEROBIC AND ANAEROBIC 8CC DRAWN BY RN   Culture NO GROWTH 2 DAYS   Final    Report Status PENDING   Incomplete   WOUND CULTURE     Status: Normal (Preliminary result)   Collection Time   01/13/12 10:10 PM      Component Value Range Status  Comment   Specimen Description WOUND RIGHT HIP   Final    Special Requests NONE   Final    Gram Stain     Final    Value: RARE WBC PRESENT, PREDOMINANTLY MONONUCLEAR     NO SQUAMOUS EPITHELIAL CELLS SEEN     ABUNDANT GRAM POSITIVE COCCI IN CLUSTERS     IN PAIRS   Culture PENDING   Incomplete    Report Status PENDING   Incomplete    Studies/Results: No results found.  Medications: I have reviewed the patient's current medications.  Assesment: Principal Problem:  *Cellulitis of right hip Active Problems:  Psychosis  Depression  Gout  Chronic pain  Decubitus ulcer of left buttock, stage 4  IBS (irritable bowel syndrome)  Hypokalemia  Anemia, chronic disease Rt hip wound (pressure wound)  Plan: Continue current treatment IV antibiotics Wound care discussed with case manger to make referral to [facility] for long term treatment    LOS: 3 days   Alawna Graybeal 01/15/2012, 7:53 AM

## 2012-01-15 NOTE — Care Management Note (Signed)
    Page 1 of 1   01/16/2012     11:09:04 AM   CARE MANAGEMENT NOTE 01/16/2012  Patient:  TRYNITEE, FORE   Account Number:  [id number]  Date Initiated:  01/15/2012  Documentation initiated by:  Sharrie Rothman  Subjective/Objective Assessment:   Pt admitted from [facility] with cellulitis. Pt will require long term wound care and IV AB.     Action/Plan:   LTAC referral made. Pt to transfer to [facility] when bed available.   Anticipated DC Date:  01/16/2012   Anticipated DC Plan:  LONG TERM ACUTE CARE (LTAC)  In-house referral  Clinical Social Worker      DC Planning Services  CM consult      Choice offered to / List presented to:             Status of service:  Completed, signed off Medicare Important Message given?  YES (If response is "NO", the following Medicare IM given date fields will be blank) Date Medicare IM given:  01/16/2012 Date Additional Medicare IM given:    Discharge Disposition:  LONG TERM ACUTE CARE (LTAC)  Per UR Regulation:    If discussed at Long Length of Stay Meetings, dates discussed:    Comments:  01/16/12 1106 Arlyss Queen, RN BSN CM Pt to be discharged to [facility] today under Dr. Letitia Neri services. Pt and pts guardian, Lauris Poag, are aware and consent to transfer. Pt to be transported by [facility]. No other CM or HH needs noted.  01/15/12 1426 Arlyss Queen, RN BSN CM

## 2012-01-15 NOTE — Clinical Social Work Note (Signed)
CSW spoke with pt's guardian [name] regarding d/c plan. She is agreeable to return to [facility] if appropriate at d/c. CM discussing LTAC referral with guardian as well.  Derenda Fennel, [place] [phone]

## 2012-01-15 NOTE — Progress Notes (Signed)
Placed on low air loss mattress due to being unable to turn pt from left to rt due to wounds on each hip. Dressing changed to right hip as ordered by [facility] nurse. Cleaned with NS. Large amount of serosanguenous  fluid expelled from wound. Small pinhole noted in center of wound. Covered with dampened 2x2's and covered with dry gauze and medipore. Left ischial pressure ulcer cleaned with ns and covered with aquacel Ag. Covered with gauxe and medipore. Tolerated well.

## 2012-01-15 NOTE — Progress Notes (Signed)
Physical Therapy Treatment Patient Details Name: [name] MRN: [id number] DOB: [birth year] Today's Date: 01/15/2012 Time: 1132-1202 PT Time Calculation (min): 30 min 1 therex 1 there activity  PT Assessment / Plan / Recommendation Comments on Treatment Session  Patient able to complete bed exercises with AAROM and mutimodal cueing. Patient was able to sit EOB with supervision;however at EOB patient became anxious and had c/o of bilateral hip LE pain, standing was attempted however patient yelled out in pain after 5 seconds and requested to lie back down and needed max A to lift LEs back into bed. Once back in bed patient's anxiety decreased.    Follow Up Recommendations        Does the patient have the potential to tolerate intense rehabilitation     Barriers to Discharge        Equipment Recommendations       Recommendations for Other Services    Frequency     Plan      Precautions / Restrictions     Pertinent Vitals/Pain     Mobility  Bed Mobility Bed Mobility: Scooting to HOB Supine to Sit: 5: Supervision Sit to Supine: 3: Mod assist (due to LE pain and immediate need to lie down) Scooting to [facility]: 5: Set up Details for Bed Mobility Assistance: patient able to move to [facility] with RLE bridging and bed in slight trendelenburg Transfers Sit to Stand: 3: Mod assist Stand to Sit: 3: Mod assist Details for Transfer Assistance: Patient only able to stand for 5 seconds and began to cry out in pain and insisted on lieing down Ambulation/Gait Ambulation/Gait Assistance: Not tested (comment) Assistive device: Rolling walker Stairs: No Wheelchair Mobility Wheelchair Mobility: No    Exercises General Exercises - Lower Extremity Ankle Circles/Pumps: 15 reps;Both Quad Sets: Both;10 reps Gluteal Sets: 5 reps Long Arc Quad: Both;10 reps (AAROM LLE) Heel Slides: AAROM;10 reps;Both   PT Diagnosis:    PT Problem List:   PT Treatment Interventions:     PT Goals     Visit Information  Last PT Received On: 01/15/12    Subjective Data  Subjective: I am having so much pain in both of my legs   Cognition       Balance     End of Session PT - End of Session Equipment Utilized During Treatment: Gait belt Activity Tolerance: Patient limited by pain Patient left: in bed;with call bell/phone within reach;with bed alarm set   GP     Shyna Duignan ATKINSO 01/15/2012, 12:20 PM

## 2012-01-15 NOTE — Clinical Social Work Note (Signed)
Pt accepted at [facility]. [facility] notified that pt will not be returning from hospital.  Derenda Fennel, LCSW [phone]

## 2012-01-16 ENCOUNTER — Encounter (HOSPITAL_COMMUNITY): Payer: Self-pay

## 2012-01-16 MED ORDER — CIPROFLOXACIN IN D5W 400 MG/200ML IV SOLN: 400.0000 mg | Freq: Two times a day (BID) | INTRAVENOUS | Status: DC

## 2012-01-16 MED ORDER — VANCOMYCIN HCL IN DEXTROSE 1-5 GM/200ML-% IV SOLN: 1000.0000 mg | Freq: Two times a day (BID) | INTRAVENOUS | Status: DC

## 2012-01-16 NOTE — Progress Notes (Signed)
Report called to [facility]   I spoke to [employer] Rn  All questions answered to her satisfaction

## 2012-01-16 NOTE — Plan of Care (Signed)
Problem: Discharge Progression Outcomes Goal: Wound improving/decreased edema Outcome: Adequate for Discharge Wound care per ostomy WOC nurse instructions Goal: Patient verbalizes wound care regimen Outcome: Not Applicable Date Met:  01/16/12 Pt to receive care at [facility] Goal: Other Discharge Outcomes/Goals Outcome: Completed/Met Date Met:  01/16/12 Report called to [facility] RN [employer]

## 2012-01-16 NOTE — Discharge Summary (Signed)
Physician Discharge Summary  Patient ID: [name] MRN: [id number] DOB/AGE: Oct 02, 1945 66 y.o. Primary Care Physician:Loras Grieshop, MD Admit date: 01/12/2012 Discharge date: 01/16/2012    Discharge Diagnoses:   Principal Problem:  *Cellulitis of right hip Active Problems:  Psychosis  Depression  Gout  Chronic pain  Decubitus ulcer of left buttock, stage 4  IBS (irritable bowel syndrome)  Hypokalemia  Anemia, chronic disease     Medication List     As of 01/16/2012  8:10 AM    TAKE these medications         ALPRAZolam 0.5 MG tablet   Commonly known as: XANAX   Take 1 tablet (0.5 mg total) by mouth 3 (three) times daily.      buPROPion 150 MG 24 hr tablet   Commonly known as: WELLBUTRIN XL   Take 300 mg by mouth every morning.      ciprofloxacin 400 MG/200ML Soln   Commonly known as: CIPRO   Inject 200 mLs (400 mg total) into the vein every 12 (twelve) hours.      dicyclomine 10 MG capsule   Commonly known as: BENTYL   Take 1 capsule (10 mg total) by mouth 4 (four) times daily -  before meals and at bedtime.      fesoterodine 4 MG Tb24   Commonly known as: TOVIAZ   Take 1 tablet (4 mg total) by mouth daily.      FLUoxetine 20 MG capsule   Commonly known as: PROZAC   Take 60 mg by mouth daily.      gabapentin 300 MG capsule   Commonly known as: NEURONTIN   Take 1 capsule (300 mg total) by mouth 3 (three) times daily.      loperamide 2 MG capsule   Commonly known as: IMODIUM   Take 2-4 mg by mouth 4 (four) times daily as needed. For diarrhea, take 2 tablets (4mg ) initially then take 2 mg after each loose stool      NORCO 5-325 MG per tablet   Generic drug: HYDROcodone-acetaminophen   TAKE (1) TABLET BY MOUTH EVERY FOUR HOURS AS NEEDED FOR PAIN.      OLANZapine 5 MG tablet   Commonly known as: ZYPREXA   Take 5 mg by mouth at bedtime.      omeprazole 20 MG capsule   Commonly known as: PRILOSEC   Take 20 mg by mouth daily.      PredniSONE 10  MG Kit   Take 1 kit (10 mg total) by mouth as directed.      simvastatin 20 MG tablet   Commonly known as: ZOCOR   Take 20 mg by mouth every evening.      vancomycin 1 GM/200ML Soln   Commonly known as: VANCOCIN   Inject 200 mLs (1,000 mg total) into the vein every 12 (twelve) hours.        Discharged Condition:*  Cellulitis of right hip  Rt hip wound (pressure wound Psychosis  Depression  Gout  Chronic pain  Decubitus ulcer of left buttock, stage 4  IBS (irritable bowel syndrome)  Hypokalemia  Anemia, chronic disease    Consults:* wound care team Significant Diagnostic Studies: Dg Hip Complete Right  01/13/2012  *RADIOLOGY REPORT*  Clinical Data: Erythema and discomfort status post total hip replacement  RIGHT HIP - COMPLETE 2+ VIEW  Comparison: 10/13/2011  Findings: Status post right total hip arthroplasty.  The acetabular component of the prosthesis has unchanged in orientation when compared to the prior  study.  The acetabular component in now more vertical in orientation, with subsidence/erosion into the overlying pelvis.  Associated heterotopic ossification.  No fracture is seen.  No definite loosening.  Secondary foreshortening of the right proximal femur.  IMPRESSION: Status post right total hip arthroplasty.  Acetabular component of the prosthesis is now more vertical in orientation with its side/erosion into the overlying pelvis.  No fracture is seen.   Original Report Authenticated By: Charline Bills, M.D.    Ct Hip Right W Contrast  01/13/2012  *RADIOLOGY REPORT*  Clinical Data: Right hip replacement.  Redness, pain, cellulitis.  CT OF THE RIGHT HIP WITH CONTRAST  Technique:  Multidetector CT imaging was performed following the standard protocol during bolus administration of intravenous contrast.  Contrast: OMNIPAQUE IOHEXOL 300 MG/ML  SOLN  Comparison: Right hip radiographs 01/12/2012.  Findings: Right hip arthroplasty with noncemented femoral and acetabular  components.  Beam hardening artifact arising from the hardware limits visualization of relevant anatomy and some portions of the study.  Heterotopic ossification lateral to the hip prosthesis.  Bone demineralization.  No definite cortical break, bone erosion, or periosteal reaction in the visualized areas.  Subcutaneous soft tissue infiltration of the fat around the right hip extending from anterior to bilaterally.  Suggestion of skin thickening.  Changes are consistent with either edema or cellulitis.  No discrete fluid collection or contrast enhancement is demonstrated that would suggest an abscess.  IMPRESSION: Right hip arthroplasty.  Infiltration in the subcutaneous fat and skin thickening around the right hip consistent with cellulitis or edema.  No discrete abscess demonstrated.   Original Report Authenticated By: Marlon Pel, M.D.    Dg Knee Complete 4 Views Right  12/28/2011  *RADIOLOGY REPORT*  Clinical Data: 66 year old female with pain after fall 2 weeks ago.  RIGHT KNEE - COMPLETE 4+ VIEW  Comparison: 10/13/2011 and earlier.  Findings: No joint effusion identified.  Patella appears stable and intact.  Osteopenia about the knee appears increased.  Joint spaces are stable.  No acute fracture or dislocation identified.  Chronic deformity proximal right fibula shaft appears stable.  IMPRESSION: Increased osteopenia about the right knee since July.  No acute fracture or dislocation identified.   Original Report Authenticated By: Harley Hallmark, M.D.     Lab Results: Basic Metabolic Panel:  Basename 01/14/12 0502  NA 146*  K 3.9  CL 110  CO2 29  GLUCOSE 76  BUN 5*  CREATININE 0.59  CALCIUM 8.5  MG --  PHOS --   Liver Function Tests: No results found for this basename: AST:2,ALT:2,ALKPHOS:2,BILITOT:2,PROT:2,ALBUMIN:2 in the last 72 hours   CBC:  Basename 01/14/12 0502  WBC 7.9  NEUTROABS --  HGB 8.7*  HCT 28.9*  MCV 84.8  PLT 492*    Recent Results (from the past 240  hour(s))  CULTURE, BLOOD (ROUTINE X 2)     Status: Normal (Preliminary result)   Collection Time   01/12/12 11:21 PM      Component Value Range Status Comment   Specimen Description BLOOD LEFT ANTECUBITAL   Final    Special Requests BOTTLES DRAWN AEROBIC AND ANAEROBIC 8CC   Final    Culture NO GROWTH 3 DAYS   Final    Report Status PENDING   Incomplete   CULTURE, BLOOD (ROUTINE X 2)     Status: Normal (Preliminary result)   Collection Time   01/12/12 11:45 PM      Component Value Range Status Comment   Specimen Description BLOOD  RIGHT ANTECUBITAL DRAWN BY RN   Final    Special Requests BOTTLES DRAWN AEROBIC AND ANAEROBIC 8CC   Final    Culture NO GROWTH 3 DAYS   Final    Report Status PENDING   Incomplete   WOUND CULTURE     Status: Normal (Preliminary result)   Collection Time   01/13/12 10:10 PM      Component Value Range Status Comment   Specimen Description WOUND RIGHT HIP   Final    Special Requests NONE   Final    Gram Stain     Final    Value: RARE WBC PRESENT, PREDOMINANTLY MONONUCLEAR     NO SQUAMOUS EPITHELIAL CELLS SEEN     ABUNDANT GRAM POSITIVE COCCI IN CLUSTERS     IN PAIRS   Culture     Final    Value: MODERATE STAPHYLOCOCCUS AUREUS     Note: RIFAMPIN AND GENTAMICIN SHOULD NOT BE USED AS SINGLE DRUGS FOR TREATMENT OF STAPH INFECTIONS.   Report Status PENDING   Incomplete      Hospital Course: * This is a 66 years old female patient with history of multiple medical illness was admitted from Nursing home due to rt hip cellulitis and worsening necrotic pressure wound. Patient was started o combination IV antibiotics. Patient improved and arrangement made to transferred to [facility] hospital to continue IV antibiotics and wound care. Discharge Exam: Blood pressure 121/82, pulse 69, temperature 98.1 F (36.7 C), temperature source Oral, resp. rate 20, height 5\' 6"  (1.676 m), weight 59.557 kg (131 lb 4.8 oz), SpO2 96.00%. * Disposition: *  [facility]  hospital.     Signed: Alleyah Twombly   01/16/2012, 8:10 AM

## 2012-01-16 NOTE — Progress Notes (Signed)
UR chart review completed.  

## 2012-01-16 NOTE — Plan of Care (Signed)
Problem: Discharge Progression Outcomes Goal: Activity appropriate for discharge plan Outcome: Adequate for Discharge Pt discharged to [facility]

## 2012-01-17 LAB — CULTURE, BLOOD (ROUTINE X 2): Culture: NO GROWTH

## 2012-01-17 LAB — WOUND CULTURE

## 2012-01-30 ENCOUNTER — Telehealth: Payer: Self-pay | Admitting: Orthopedic Surgery

## 2012-01-30 NOTE — Telephone Encounter (Signed)
01/29/12 a.m. - Call from nurse supervisor, Elonda Husky, [facility], ph [phone], [phone] inquiring about scheduling appointment.  States patient is now at this facility, has faxed notes (copies scanned into chart.) *  * To Dr. Romeo Apple for review. 01/30/12 a.m. - Per Dr. Romeo Apple -  1. no appointment /  2. Dr. Felecia Shelling and infectious disease have been contacted /  3. Arrangements will be made for admit with Dr. Felecia Shelling. 01/30/12  10:28a.m. -  I called back to [facility],  left voice mail message for Elonda Husky, with this information, to call back if any further assistance is needed.  (By end of day, no further call received.)

## 2012-02-06 ENCOUNTER — Inpatient Hospital Stay (HOSPITAL_COMMUNITY): Payer: Medicare Other

## 2012-02-06 ENCOUNTER — Inpatient Hospital Stay (HOSPITAL_COMMUNITY)
Admission: AD | Admit: 2012-02-06 | Discharge: 2012-02-12 | DRG: 862 | Disposition: A | Discharge status: Skilled Nursing Facility | Payer: Medicare Other | Source: Ambulatory Visit | Attending: Internal Medicine | Admitting: Internal Medicine

## 2012-02-06 ENCOUNTER — Encounter (HOSPITAL_COMMUNITY): Payer: Self-pay | Admitting: *Deleted

## 2012-02-06 DIAGNOSIS — M549 Dorsalgia, unspecified: Secondary | ICD-10-CM | POA: Diagnosis present

## 2012-02-06 DIAGNOSIS — IMO0002 Reserved for concepts with insufficient information to code with codable children: Secondary | ICD-10-CM

## 2012-02-06 DIAGNOSIS — L89324 Pressure ulcer of left buttock, stage 4: Secondary | ICD-10-CM | POA: Diagnosis present

## 2012-02-06 DIAGNOSIS — F329 Major depressive disorder, single episode, unspecified: Secondary | ICD-10-CM | POA: Diagnosis present

## 2012-02-06 DIAGNOSIS — L03119 Cellulitis of unspecified part of limb: Secondary | ICD-10-CM | POA: Diagnosis present

## 2012-02-06 DIAGNOSIS — T8149XA Infection following a procedure, other surgical site, initial encounter: Secondary | ICD-10-CM

## 2012-02-06 DIAGNOSIS — K589 Irritable bowel syndrome without diarrhea: Secondary | ICD-10-CM | POA: Diagnosis present

## 2012-02-06 DIAGNOSIS — D649 Anemia, unspecified: Secondary | ICD-10-CM | POA: Diagnosis present

## 2012-02-06 DIAGNOSIS — K219 Gastro-esophageal reflux disease without esophagitis: Secondary | ICD-10-CM | POA: Diagnosis present

## 2012-02-06 DIAGNOSIS — M109 Gout, unspecified: Secondary | ICD-10-CM | POA: Diagnosis present

## 2012-02-06 DIAGNOSIS — T84029A Dislocation of unspecified internal joint prosthesis, initial encounter: Secondary | ICD-10-CM | POA: Diagnosis present

## 2012-02-06 DIAGNOSIS — L8994 Pressure ulcer of unspecified site, stage 4: Secondary | ICD-10-CM | POA: Diagnosis present

## 2012-02-06 DIAGNOSIS — Z96649 Presence of unspecified artificial hip joint: Secondary | ICD-10-CM

## 2012-02-06 DIAGNOSIS — S72001A Fracture of unspecified part of neck of right femur, initial encounter for closed fracture: Secondary | ICD-10-CM | POA: Diagnosis present

## 2012-02-06 DIAGNOSIS — Y831 Surgical operation with implant of artificial internal device as the cause of abnormal reaction of the patient, or of later complication, without mention of misadventure at the time of the procedure: Secondary | ICD-10-CM | POA: Diagnosis present

## 2012-02-06 DIAGNOSIS — F3289 Other specified depressive episodes: Secondary | ICD-10-CM | POA: Diagnosis present

## 2012-02-06 DIAGNOSIS — Z79899 Other long term (current) drug therapy: Secondary | ICD-10-CM

## 2012-02-06 DIAGNOSIS — T8450XA Infection and inflammatory reaction due to unspecified internal joint prosthesis, initial encounter: Secondary | ICD-10-CM

## 2012-02-06 DIAGNOSIS — E876 Hypokalemia: Secondary | ICD-10-CM | POA: Diagnosis present

## 2012-02-06 DIAGNOSIS — L03115 Cellulitis of right lower limb: Secondary | ICD-10-CM

## 2012-02-06 DIAGNOSIS — F29 Unspecified psychosis not due to a substance or known physiological condition: Secondary | ICD-10-CM | POA: Diagnosis present

## 2012-02-06 DIAGNOSIS — G8929 Other chronic pain: Secondary | ICD-10-CM | POA: Diagnosis present

## 2012-02-06 DIAGNOSIS — F411 Generalized anxiety disorder: Secondary | ICD-10-CM | POA: Diagnosis present

## 2012-02-06 DIAGNOSIS — T8140XA Infection following a procedure, unspecified, initial encounter: Principal | ICD-10-CM | POA: Diagnosis present

## 2012-02-06 DIAGNOSIS — L02419 Cutaneous abscess of limb, unspecified: Secondary | ICD-10-CM | POA: Diagnosis present

## 2012-02-06 DIAGNOSIS — L89309 Pressure ulcer of unspecified buttock, unspecified stage: Secondary | ICD-10-CM | POA: Diagnosis present

## 2012-02-06 DIAGNOSIS — F32A Depression, unspecified: Secondary | ICD-10-CM | POA: Diagnosis present

## 2012-02-06 LAB — CBC WITH DIFFERENTIAL/PLATELET
Basophils Relative: 0 % (ref 0–1)
Eosinophils Absolute: 0.3 10*3/uL (ref 0.0–0.7)
Eosinophils Relative: 4 % (ref 0–5)
HCT: 32.3 % — ABNORMAL LOW (ref 36.0–46.0)
Hemoglobin: 10 g/dL — ABNORMAL LOW (ref 12.0–15.0)
MCH: 25.8 pg — ABNORMAL LOW (ref 26.0–34.0)
MCHC: 31 g/dL (ref 30.0–36.0)
MCV: 83.5 fL (ref 78.0–100.0)
Monocytes Absolute: 0.5 10*3/uL (ref 0.1–1.0)
Monocytes Relative: 7 % (ref 3–12)

## 2012-02-06 LAB — PROTIME-INR: Prothrombin Time: 13.4 seconds (ref 11.6–15.2)

## 2012-02-06 LAB — BASIC METABOLIC PANEL
BUN: 17 mg/dL (ref 6–23)
CO2: 25 mEq/L (ref 19–32)
Chloride: 102 mEq/L (ref 96–112)
Creatinine, Ser: 0.78 mg/dL (ref 0.50–1.10)
Glucose, Bld: 124 mg/dL — ABNORMAL HIGH (ref 70–99)

## 2012-02-06 MED ORDER — FLUOXETINE HCL 20 MG PO CAPS
60.0000 mg | ORAL_CAPSULE | Freq: Every day | ORAL | Status: DC
  Administered 2012-02-07 – 2012-02-12 (×6): 60 mg via ORAL
  Filled 2012-02-06 (×6): qty 3

## 2012-02-06 MED ORDER — CHLORHEXIDINE GLUCONATE 4 % EX LIQD: 60.0000 mL | Freq: Once | CUTANEOUS | Status: DC

## 2012-02-06 MED ORDER — HYDROCODONE-ACETAMINOPHEN 5-325 MG PO TABS
1.0000 | ORAL_TABLET | Freq: Four times a day (QID) | ORAL | Status: DC | PRN
  Administered 2012-02-06 – 2012-02-07 (×3): 1 via ORAL
  Filled 2012-02-06 (×3): qty 1

## 2012-02-06 MED ORDER — BUPROPION HCL ER (XL) 300 MG PO TB24
300.0000 mg | ORAL_TABLET | Freq: Every day | ORAL | Status: DC
  Administered 2012-02-07 – 2012-02-12 (×6): 300 mg via ORAL
  Filled 2012-02-06 (×7): qty 1

## 2012-02-06 MED ORDER — ENOXAPARIN SODIUM 40 MG/0.4ML ~~LOC~~ SOLN
40.0000 mg | SUBCUTANEOUS | Status: DC
  Administered 2012-02-06 – 2012-02-07 (×2): 40 mg via SUBCUTANEOUS
  Filled 2012-02-06 (×2): qty 0.4

## 2012-02-06 MED ORDER — FESOTERODINE FUMARATE ER 4 MG PO TB24
4.0000 mg | ORAL_TABLET | Freq: Every day | ORAL | Status: DC
  Administered 2012-02-06 – 2012-02-12 (×7): 4 mg via ORAL
  Filled 2012-02-06 (×8): qty 1

## 2012-02-06 MED ORDER — ALPRAZOLAM 0.5 MG PO TABS
0.5000 mg | ORAL_TABLET | Freq: Three times a day (TID) | ORAL | Status: DC
Start: 2012-02-06 — End: 2012-02-12
  Administered 2012-02-06 – 2012-02-12 (×14): 0.5 mg via ORAL
  Filled 2012-02-06 (×14): qty 1

## 2012-02-06 MED ORDER — DOXYCYCLINE HYCLATE 100 MG PO TABS
100.0000 mg | ORAL_TABLET | Freq: Two times a day (BID) | ORAL | Status: DC
  Administered 2012-02-07 – 2012-02-08 (×2): 100 mg via ORAL
  Filled 2012-02-06 (×2): qty 1

## 2012-02-06 MED ORDER — DICYCLOMINE HCL 10 MG PO CAPS
10.0000 mg | ORAL_CAPSULE | Freq: Three times a day (TID) | ORAL | Status: DC
  Administered 2012-02-06 – 2012-02-12 (×18): 10 mg via ORAL
  Filled 2012-02-06 (×19): qty 1

## 2012-02-06 MED ORDER — LOPERAMIDE HCL 2 MG PO CAPS: 2.0000 mg | ORAL_CAPSULE | Freq: Four times a day (QID) | ORAL | Status: DC | PRN

## 2012-02-06 MED ORDER — OLANZAPINE 5 MG PO TABS
5.0000 mg | ORAL_TABLET | Freq: Every day | ORAL | Status: DC
  Administered 2012-02-06 – 2012-02-11 (×6): 5 mg via ORAL
  Filled 2012-02-06 (×6): qty 1

## 2012-02-06 MED ORDER — SIMVASTATIN 20 MG PO TABS
20.0000 mg | ORAL_TABLET | Freq: Every day | ORAL | Status: DC
  Administered 2012-02-06 – 2012-02-11 (×6): 20 mg via ORAL
  Filled 2012-02-06 (×6): qty 1

## 2012-02-06 MED ORDER — GABAPENTIN 300 MG PO CAPS
300.0000 mg | ORAL_CAPSULE | Freq: Three times a day (TID) | ORAL | Status: DC
  Administered 2012-02-06 – 2012-02-12 (×14): 300 mg via ORAL
  Filled 2012-02-06 (×14): qty 1

## 2012-02-06 MED ORDER — PANTOPRAZOLE SODIUM 40 MG PO TBEC
40.0000 mg | DELAYED_RELEASE_TABLET | Freq: Every day | ORAL | Status: DC
  Administered 2012-02-06 – 2012-02-12 (×7): 40 mg via ORAL
  Filled 2012-02-06 (×7): qty 1

## 2012-02-06 NOTE — Consult Note (Signed)
Reason for Consult:evaluation right hip  Referring Physician: [name] is an 66 y.o. female.  HPI: 66 yo female fractured her right hip in May 01, 2011. She had an uncomplicated right bipolar replacement. She did well until Feb 28. She was treated for abductor rupture, wound seroma and possible infection. The abductors were repaired and cultures grew rare oxacillin sensitive Staph. She was treated with Vancomycin and recovered nicely.  In Oct 2013 she was admitted for right hip wound cellulitis and grade 4 pressure uncle of the left ischium. She was treated with IV vancomycin and sent to a long term care hospital for convalescence.  She has not improved and is now admitted for evaluation of her hip and chronic infection   Past Medical History  Diagnosis Date  . IBS (irritable bowel syndrome)   . GERD (gastroesophageal reflux disease)   . Gout   . Chronic back pain   . Chronic right hip pain   . Chronic diarrhea   . Depression   . Anxiety and depression     Past Surgical History  Procedure Date  . Hip arthroplasty 05/01/2011    Procedure: right ARTHROPLASTY BIPOLAR HIP;  Surgeon: Fuller Canada, MD;  Location: [facility];  Service: Orthopedics;  Laterality: Right;  . Incision and drainage of wound 05/24/2011    Procedure: IRRIGATION AND DEBRIDEMENT WOUND;  Surgeon: Fuller Canada, MD;  Location: [facility];  Service: Orthopedics;  Laterality: Right;    No family history on file.  Social History:  reports that she has never smoked. She does not have any smokeless tobacco history on file. She reports that she does not drink alcohol or use illicit drugs.  Allergies:  Allergies  Allergen Reactions  . Diphenhydramine Other (See Comments)    Feels bad    Medications: I have reviewed the patient's current medications.  No results found for this or any previous visit (from the past 48 hour(s)).  No results found.  Review of Systems  Unable to perform ROS: mental acuity    Blood pressure 99/56, pulse 65, temperature 99.7 F (37.6 C), temperature source Oral, height 5\' 7"  (1.702 m), weight 127 lb 13.9 oz (58 kg), SpO2 95.00%. Physical Exam  Constitutional: She is oriented to person, place, and time. She appears well-developed. No distress.  HENT:  Head: Normocephalic.  Eyes: Pupils are equal, round, and reactive to light.  Neck: Normal range of motion.  Cardiovascular: Normal rate.   Respiratory: Effort normal.  GI: Soft. Bowel sounds are normal.  Musculoskeletal:       Upper extremity exam  Inspection and palpation revealed no abnormalities in the upper extremities.  Range of motion is full without contracture.  Motor exam is normal with grade 5 strength.  The joints are fully reduced without subluxation.  There is no atrophy or tremor and muscle tone is normal.  All joints are stable.    Right lower extremity. There is a pinpoint area of drainage from the right hip wound. There is no surrounding erythema. There is no swelling. She does have painful range of motion of the right hip including groin pain and lateral pelvic pain. Her right lower extremity shorter than the left approximately 2 cm. She also has knee pain with range of motion and tenderness in the knee but no swelling.  Left lower extremity normal  Gluteal region no evidence of pressure ulcer.  Neurological: She is alert and oriented to person, place, and time. She has normal reflexes.  Skin: Skin is warm and dry.  Psychiatric: She has a normal mood and affect. Her behavior is normal. Judgment and thought content normal.    Assessment/Plan: Differential diagnosis, wound infection superficial versus deep. Periprosthetic infection. There is superior migration of the prosthesis indicating acetabular erosion cause unknown May be secondary to underlying infection.  Plan wound exploration, possible removal of prosthesis. Irrigation debridement intraoperative cultures. Staged procedure. The  final reconstruction will depend on whether or not the wound is infected. I do suspect the prosthesis will have to be removed secondary to proximal migration.  Fuller Canada 02/06/2012, 8:09 PM

## 2012-02-06 NOTE — Progress Notes (Addendum)
Attempted to and was unable to reach [name] the patients guardian to get surgical consent.  Will try again in the am.  Called the [facility] and made the OR team aware.  Will pass to day nurse to get consent in the AM.

## 2012-02-07 ENCOUNTER — Encounter (HOSPITAL_COMMUNITY): Payer: Self-pay | Admitting: Anesthesiology

## 2012-02-07 ENCOUNTER — Encounter (HOSPITAL_COMMUNITY): Payer: Self-pay | Admitting: *Deleted

## 2012-02-07 ENCOUNTER — Encounter (HOSPITAL_COMMUNITY)
Admission: AD | Disposition: A | Discharge status: Skilled Nursing Facility | Payer: Self-pay | Source: Ambulatory Visit | Attending: Internal Medicine

## 2012-02-07 ENCOUNTER — Telehealth: Payer: Self-pay | Admitting: Orthopedic Surgery

## 2012-02-07 LAB — CBC
Hemoglobin: 9.9 g/dL — ABNORMAL LOW (ref 12.0–15.0)
MCH: 25.7 pg — ABNORMAL LOW (ref 26.0–34.0)
RBC: 3.85 MIL/uL — ABNORMAL LOW (ref 3.87–5.11)

## 2012-02-07 LAB — BASIC METABOLIC PANEL
CO2: 27 mEq/L (ref 19–32)
Glucose, Bld: 81 mg/dL (ref 70–99)
Potassium: 3.8 mEq/L (ref 3.5–5.1)
Sodium: 140 mEq/L (ref 135–145)

## 2012-02-07 LAB — SURGICAL PCR SCREEN: Staphylococcus aureus: NEGATIVE

## 2012-02-07 SURGERY — CANCELLED PROCEDURE

## 2012-02-07 MED ORDER — MIDAZOLAM HCL 2 MG/2ML IJ SOLN
2.0000 mg | INTRAMUSCULAR | Status: DC | PRN
  Administered 2012-02-07: 2 mg via INTRAVENOUS

## 2012-02-07 MED ORDER — MIDAZOLAM HCL 2 MG/2ML IJ SOLN
1.0000 mg | INTRAMUSCULAR | Status: AC | PRN
  Administered 2012-02-07 (×3): 2 mg via INTRAVENOUS

## 2012-02-07 MED ORDER — LIDOCAINE HCL (PF) 1 % IJ SOLN
INTRAMUSCULAR | Status: AC
  Filled 2012-02-07: qty 5

## 2012-02-07 MED ORDER — LACTATED RINGERS IV SOLN: INTRAVENOUS | Status: DC

## 2012-02-07 MED ORDER — MIDAZOLAM HCL 2 MG/2ML IJ SOLN
INTRAMUSCULAR | Status: AC
  Filled 2012-02-07: qty 2

## 2012-02-07 MED ORDER — CHLORHEXIDINE GLUCONATE 4 % EX LIQD
60.0000 mL | Freq: Once | CUTANEOUS | Status: AC
  Administered 2012-02-07: 4 via TOPICAL
  Filled 2012-02-07: qty 15

## 2012-02-07 MED ORDER — PROPOFOL 10 MG/ML IV EMUL
INTRAVENOUS | Status: AC
  Filled 2012-02-07: qty 20

## 2012-02-07 MED ORDER — HYDROCODONE-ACETAMINOPHEN 5-325 MG PO TABS
1.0000 | ORAL_TABLET | ORAL | Status: DC | PRN
  Administered 2012-02-07 – 2012-02-08 (×2): 1 via ORAL
  Filled 2012-02-07 (×2): qty 1

## 2012-02-07 MED ORDER — GLYCOPYRROLATE 0.2 MG/ML IJ SOLN
INTRAMUSCULAR | Status: AC
  Filled 2012-02-07: qty 1

## 2012-02-07 MED ORDER — ONDANSETRON HCL 4 MG/2ML IJ SOLN
4.0000 mg | Freq: Four times a day (QID) | INTRAMUSCULAR | Status: DC | PRN
  Administered 2012-02-07 – 2012-02-08 (×2): 4 mg via INTRAVENOUS
  Filled 2012-02-07 (×2): qty 2

## 2012-02-07 SURGICAL SUPPLY — 30 items
BAG HAMPER (MISCELLANEOUS) ×1 IMPLANT
CLOTH BEACON ORANGE TIMEOUT ST (SAFETY) ×1 IMPLANT
COVER LIGHT HANDLE STERIS (MISCELLANEOUS) ×2 IMPLANT
DRAPE HIP W/POCKET STRL (DRAPE) ×1 IMPLANT
DRAPE U-SHAPE 47X51 STRL (DRAPES) ×1 IMPLANT
ELECT REM PT RETURN 9FT ADLT (ELECTROSURGICAL)
ELECTRODE REM PT RTRN 9FT ADLT (ELECTROSURGICAL) ×1 IMPLANT
GLOVE SKINSENSE NS SZ8.0 LF (GLOVE)
GLOVE SKINSENSE STRL SZ8.0 LF (GLOVE) ×1 IMPLANT
GLOVE SS N UNI LF 8.5 STRL (GLOVE) ×1 IMPLANT
GOWN STRL REIN XL XLG (GOWN DISPOSABLE) ×2 IMPLANT
HANDPIECE INTERPULSE COAX TIP (DISPOSABLE)
INST SET MINOR BONE (KITS) ×1 IMPLANT
IV NS IRRIG 3000ML ARTHROMATIC (IV SOLUTION) IMPLANT
KIT ROOM TURNOVER APOR (KITS) ×1 IMPLANT
MANIFOLD NEPTUNE II (INSTRUMENTS) ×1 IMPLANT
MARKER SKIN DUAL TIP RULER LAB (MISCELLANEOUS) ×1 IMPLANT
NS IRRIG 1000ML POUR BTL (IV SOLUTION) ×1 IMPLANT
PACK TOTAL JOINT (CUSTOM PROCEDURE TRAY) ×1 IMPLANT
PAD ABD 5X9 TENDERSORB (GAUZE/BANDAGES/DRESSINGS) IMPLANT
PAD ARMBOARD 7.5X6 YLW CONV (MISCELLANEOUS) ×1 IMPLANT
SET BASIN LINEN APH (SET/KITS/TRAYS/PACK) ×1 IMPLANT
SET HNDPC FAN SPRY TIP SCT (DISPOSABLE) IMPLANT
SOL PREP PROV IODINE SCRUB 4OZ (MISCELLANEOUS) ×1 IMPLANT
SPONGE GAUZE 4X4 12PLY (GAUZE/BANDAGES/DRESSINGS) ×1 IMPLANT
SUT ETHILON 3 0 FSL (SUTURE) IMPLANT
SUT MON AB 2-0 CT1 36 (SUTURE) IMPLANT
SWAB CULTURE LIQ STUART DBL (MISCELLANEOUS) IMPLANT
SYR BULB IRRIGATION 50ML (SYRINGE) ×1 IMPLANT
TUBE ANAEROBIC PORT A CUL  W/M (MISCELLANEOUS) ×1 IMPLANT

## 2012-02-07 NOTE — OR Nursing (Signed)
Pt. Took back to room via her bed.

## 2012-02-07 NOTE — Telephone Encounter (Signed)
Barbara/[facility]. Called to let you know that [name]'s POA will not give consent for Henessy's surgery until she goes before a committee. Barbara's phone # [phone]

## 2012-02-07 NOTE — H&P (Signed)
CLARABELL GRABOSKI MRN: [id number] DOB/AGE: 1945-03-28 66 y.o. Primary Care Physician:Venetia Prewitt, MD Admit date: 02/06/2012 Chief Complaint: Rt hip Wound HPI:  This is a 66 years old female patient with history of multiple medical illnesses transferred from [facility]. Patient recently developed cellulitis and wound on the rt hip area. 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 She had wound care and IV antibiotics. Deep wound culture showed multidrug resist bacteria. Her condition was discussed with her orthopedist with DR. Romeo Apple and was transferred her for wound debridement or removal the prosthesis. No fever, chills, cough, nausea or vomiting. No cough, shortness of breath or chest pain.  Past Medical History  Diagnosis Date  . IBS (irritable bowel syndrome)   . GERD (gastroesophageal reflux disease)   . Gout   . Chronic back pain   . Chronic right hip pain   . Chronic diarrhea   . Depression   . Anxiety and depression    Past Surgical History  Procedure Date  . Hip arthroplasty 05/01/2011    Procedure: right ARTHROPLASTY BIPOLAR HIP;  Surgeon: Fuller Canada, MD;  Location: [facility];  Service: Orthopedics;  Laterality: Right;  . Incision and drainage of wound 05/24/2011    Procedure: IRRIGATION AND DEBRIDEMENT WOUND;  Surgeon: Fuller Canada, MD;  Location: [facility];  Service: Orthopedics;  Laterality: Right;        History reviewed. No pertinent family history.  Social History:  reports that she has never smoked. She does not have any smokeless tobacco history on file. She reports that she does not drink alcohol or use illicit drugs.   Allergies:  Allergies  Allergen Reactions  . Diphenhydramine Other (See Comments)    Feels bad    Medications Prior to Admission  Medication Sig Dispense Refill  . ALPRAZolam (XANAX) 0.5 MG tablet Take 1 tablet (0.5 mg total) by mouth 3 (three) times daily.  30 tablet  2  . buPROPion (WELLBUTRIN XL) 150 MG 24  hr tablet Take 300 mg by mouth every morning.      . ciprofloxacin (CIPRO) 400 MG/200ML SOLN Inject 200 mLs (400 mg total) into the vein every 12 (twelve) hours.  2000 mL  14  . dicyclomine (BENTYL) 10 MG capsule Take 1 capsule (10 mg total) by mouth 4 (four) times daily -  before meals and at bedtime.  40 capsule  0  . fesoterodine (TOVIAZ) 4 MG TB24 Take 1 tablet (4 mg total) by mouth daily.  30 tablet  0  . FLUoxetine (PROZAC) 20 MG capsule Take 60 mg by mouth daily.      Marland Kitchen gabapentin (NEURONTIN) 300 MG capsule Take 1 capsule (300 mg total) by mouth 3 (three) times daily.  90 capsule  5  . loperamide (IMODIUM) 2 MG capsule Take 2-4 mg by mouth 4 (four) times daily as needed. For diarrhea, take 2 tablets (4mg ) initially then take 2 mg after each loose stool      . NORCO 5-325 MG per tablet TAKE (1) TABLET BY MOUTH EVERY FOUR HOURS AS NEEDED FOR PAIN.  60 each  5  . OLANZapine (ZYPREXA) 5 MG tablet Take 5 mg by mouth at bedtime.      Marland Kitchen omeprazole (PRILOSEC) 20 MG capsule Take 20 mg by mouth daily.      . PredniSONE 10 MG KIT Take 1 kit (10 mg total) by mouth as directed.  1 kit  0  . simvastatin (ZOCOR) 20 MG tablet Take  20 mg by mouth every evening.      . vancomycin (VANCOCIN) 1 GM/200ML SOLN Inject 200 mLs (1,000 mg total) into the vein every 12 (twelve) hours.  4000 mL  10       ZOX:WRUEA from the symptoms mentioned above,there are no other symptoms referable to all systems reviewed.  Physical Exam: Blood pressure 87/55, pulse 71, temperature 98.1 F (36.7 C), temperature source Oral, resp. rate 20, height 5\' 7"  (1.702 m), weight 58 kg (127 lb 13.9 oz), SpO2 97.00%. General condition- alert and awake, chronically sicklooking HE ENT- pupils equal and reactive, neck supple Respiratory- decreased air entry CVS- S1 and S2 heard, no murmur or gallop ABD- soft and lax, bowel sound + EXT- no leg edema    Basename 02/07/12 0455 02/06/12 2000  WBC 5.2 6.6  NEUTROABS -- 3.8  HGB 9.9*  10.0*  HCT 32.5* 32.3*  MCV 84.4 83.5  PLT 299 339    Basename 02/07/12 0455 02/06/12 2000  NA 140 138  K 3.8 3.8  CL 104 102  CO2 27 25  GLUCOSE 81 124*  BUN 15 17  CREATININE 0.67 0.78  CALCIUM 9.3 9.2  MG -- --  lablast2(ast:2,ALT:2,alkphos:2,bilitot:2,prot:2,albumin:2)@    Recent Results (from the past 240 hour(s))  SURGICAL PCR SCREEN     Status: Normal   Collection Time   02/06/12 10:48 PM      Component Value Range Status Comment   MRSA, PCR NEGATIVE  NEGATIVE Final    Staphylococcus aureus NEGATIVE  NEGATIVE Final      Dg Hip Complete Right  01/13/2012  *RADIOLOGY REPORT*  Clinical Data: Erythema and discomfort status post total hip replacement  RIGHT HIP - COMPLETE 2+ VIEW  Comparison: 10/13/2011  Findings: Status post right total hip arthroplasty.  The acetabular component of the prosthesis has unchanged in orientation when compared to the prior study.  The acetabular component in now more vertical in orientation, with subsidence/erosion into the overlying pelvis.  Associated heterotopic ossification.  No fracture is seen.  No definite loosening.  Secondary foreshortening of the right proximal femur.  IMPRESSION: Status post right total hip arthroplasty.  Acetabular component of the prosthesis is now more vertical in orientation with its side/erosion into the overlying pelvis.  No fracture is seen.   Original Report Authenticated By: Charline Bills, M.D.    Dg Pelvis Portable  02/06/2012  *RADIOLOGY REPORT*  Clinical Data: Preoperative exam for surgery to remove infection  PORTABLE PELVIS  Comparison: 05/01/2011.  08/23/2011.  Findings: The left hip and left hemi pelvis appear normal.  There is subsidence of the bipolar hip replacement into the acetabulum with sclerotic change.  By history, this could be consistent with osteomyelitis and osteomalacia.  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  Clinical history states  infection and the findings could certainly go along with that.   Original Report Authenticated By: Paulina Fusi, M.D.    Ct Hip Right W Contrast  01/13/2012  *RADIOLOGY REPORT*  Clinical Data: Right hip replacement.  Redness, pain, cellulitis.  CT OF THE RIGHT HIP WITH CONTRAST  Technique:  Multidetector CT imaging was performed following the standard protocol during bolus administration of intravenous contrast.  Contrast: OMNIPAQUE IOHEXOL 300 MG/ML  SOLN  Comparison: Right hip radiographs 01/12/2012.  Findings: Right hip arthroplasty with noncemented femoral and acetabular components.  Beam hardening artifact arising from the hardware limits visualization of relevant anatomy and some portions of the study.  Heterotopic  ossification lateral to the hip prosthesis.  Bone demineralization.  No definite cortical break, bone erosion, or periosteal reaction in the visualized areas.  Subcutaneous soft tissue infiltration of the fat around the right hip extending from anterior to bilaterally.  Suggestion of skin thickening.  Changes are consistent with either edema or cellulitis.  No discrete fluid collection or contrast enhancement is demonstrated that would suggest an abscess.  IMPRESSION: Right hip arthroplasty.  Infiltration in the subcutaneous fat and skin thickening around the right hip consistent with cellulitis or edema.  No discrete abscess demonstrated.   Original Report Authenticated By: Marlon Pel, M.D.    Dg Chest Portable 1 View  02/06/2012  *RADIOLOGY REPORT*  Clinical Data: Preoperative respiratory exam for pelvic surgery.  PORTABLE CHEST - 1 VIEW  Comparison: 01/23/2012  Findings: Right arm PICC has its tip in the SVC 6 cm above the right atrium.  Heart size is normal.  Mediastinal shadows are normal.  Lungs are clear except for mild linear atelectasis at the left base.  Old healed rib fractures on the left.  IMPRESSION: Mild linear atelectasis left base.  Otherwise no active disease.    Original Report Authenticated By: Paulina Fusi, M.D.    Impression: 1. Infected rt hip prosthesis 2. Rt hip wound 3. S/P hip replacement 4. IBS 5.Anemia 6.Hypokalemia 7. Depression 8. Psychosis  Active Problems:  * No active hospital problems. *      Plan: Orthopedic consult As per orthopedic plan Continue current treatment.      Castle Lamons Pager [phone]  02/07/2012, 6:49 AM

## 2012-02-07 NOTE — Progress Notes (Addendum)
Patient ID: [name], female   DOB: 17-Dec-1945, 66 y.o.   MRN: [id number]  The surgery has been postponed it is 4:30 and the patient has still not been brought back to surgery. Uncontrolled and unforeseen circumstances has caused the surgery to be delayed and will be put on for tomorrow  The patient will be allowed to eat tonight and will be n.p.o. after midnight

## 2012-02-07 NOTE — Anesthesia Preprocedure Evaluation (Deleted)
Anesthesia Evaluation  Patient identified by MRN, date of birth, ID band Patient awake    Reviewed: Allergy & Precautions, H&P , NPO status , Patient's Chart, lab work & pertinent test results  History of Anesthesia Complications Negative for: history of anesthetic complications  Airway Mallampati: II      Dental  (+) Edentulous Upper and Edentulous Lower   Pulmonary  breath sounds clear to auscultation        Cardiovascular negative cardio ROS  Rhythm:Regular Rate:Normal     Neuro/Psych PSYCHIATRIC DISORDERS (dementia) Depression    GI/Hepatic GERD-  Controlled and Medicated,  Endo/Other    Renal/GU      Musculoskeletal   Abdominal   Peds  Hematology   Anesthesia Other Findings   Reproductive/Obstetrics                           Anesthesia Physical Anesthesia Plan  ASA: III  Anesthesia Plan: Spinal   Post-op Pain Management:    Induction:   Airway Management Planned: Nasal Cannula  Additional Equipment:   Intra-op Plan:   Post-operative Plan:   Informed Consent: I have reviewed the patients History and Physical, chart, labs and discussed the procedure including the risks, benefits and alternatives for the proposed anesthesia with the patient or authorized representative who has indicated his/her understanding and acceptance.     Plan Discussed with:   Anesthesia Plan Comments:         Anesthesia Quick Evaluation

## 2012-02-07 NOTE — Progress Notes (Signed)
Attempted to call patient's guardian, [name], this morning.  Ms. [name] mailbox was full unable to leave message.  Was able to leave call back number.  Will continue to attempt to contact patient's guardian.

## 2012-02-07 NOTE — Progress Notes (Signed)
Case management provided nurse with another contact number for patient's guardian.  Nurse attempted to call [name] at new number.  Had to leave a voicemail.

## 2012-02-07 NOTE — Progress Notes (Signed)
Subjective:  Complains of pain around her rt hip. No fever or chills. No chest pain.  Objective: Vital signs in last 24 hours: Temp:  [98 F (36.7 C)-99.7 F (37.6 C)] 98.1 F (36.7 C) (11/14 0533) Pulse Rate:  [65-71] 71  (11/14 0533) Resp:  [20] 20  (11/14 0533) BP: (87-99)/(44-56) 87/55 mmHg (11/14 0533) SpO2:  [95 %-97 %] 97 % (11/14 0533) Weight:  [58 kg (127 lb 13.9 oz)] 58 kg (127 lb 13.9 oz) (11/13 1900) Weight change:     Intake/Output from previous day:    PHYSICAL EXAM General appearance: alert and no distress Resp: clear to auscultation bilaterally Cardio: S1, S2 normal GI: soft, non-tender; bowel sounds normal; no masses,  no organomegaly Extremities: small wound with pussy drainage on the rt hip  Lab Results:    @labtest @ [facility] No results found for this basename: PHART,PCO2,PO2ART,TCO2,HCO3 in the last 72 hours CULTURES Recent Results (from the past 240 hour(s))  SURGICAL PCR SCREEN     Status: Normal   Collection Time   02/06/12 10:48 PM      Component Value Range Status Comment   MRSA, PCR NEGATIVE  NEGATIVE Final    Staphylococcus aureus NEGATIVE  NEGATIVE Final    Studies/Results: Dg Pelvis Portable  02/06/2012  *RADIOLOGY REPORT*  Clinical Data: Preoperative exam for surgery to remove infection  PORTABLE PELVIS  Comparison: 05/01/2011.  08/23/2011.  Findings: The left hip and left hemi pelvis appear normal.  There is subsidence of the bipolar hip replacement into the acetabulum with sclerotic change.  By history, this could be consistent with osteomyelitis and osteomalacia.  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  Clinical history states infection and the findings could certainly go along with that.   Original Report Authenticated By: Paulina Fusi, M.D.    Dg Chest Portable 1 View  02/06/2012  *RADIOLOGY REPORT*  Clinical Data: Preoperative respiratory exam for pelvic surgery.  PORTABLE CHEST -  1 VIEW  Comparison: 01/23/2012  Findings: Right arm PICC has its tip in the SVC 6 cm above the right atrium.  Heart size is normal.  Mediastinal shadows are normal.  Lungs are clear except for mild linear atelectasis at the left base.  Old healed rib fractures on the left.  IMPRESSION: Mild linear atelectasis left base.  Otherwise no active disease.   Original Report Authenticated By: Paulina Fusi, M.D.     Medications: I have reviewed the patient's current medications.  Assesment:  Active Problems:  * No active hospital problems. *   Infected rt hip prosthesis  2. Rt hip wound  3. S/P hip replacement  4. IBS  5.Anemia  6.Hypokalemia  7. Depression  8. Psychosis   Plan: Patient is scheduled for surgery to today Continue regular treatment.    LOS: 1 day   Ardean Simonich 02/07/2012, 8:04 AM

## 2012-02-07 NOTE — Progress Notes (Signed)
Reported off to barbara morris rn 

## 2012-02-07 NOTE — Progress Notes (Signed)
Pt c/o of pain 10 of 10 after receiving pain medication.  Dr. Felecia Shelling paged.  Received phone order to increase to Norco 5/325 mg every four hours as needed for pain.  Orders followed.

## 2012-02-07 NOTE — Progress Notes (Signed)
Received return call from patient's guardian, [name], and obtained telephone consent.  MD and OR notified.

## 2012-02-07 NOTE — OR Nursing (Signed)
Surgery postponed till tomorrow per Dr. Romeo Apple .   Report called to Casimiro Needle. Keep pt. Npo after midnight  Pt. Transported back [place].

## 2012-02-07 NOTE — Care Management Note (Signed)
    Page 1 of 1   02/12/2012     10:34:50 AM   CARE MANAGEMENT NOTE 02/12/2012  Patient:  [name], [name]   Account Number:  [id number]  Date Initiated:  02/07/2012  Documentation initiated by:  Sharrie Rothman  Subjective/Objective Assessment:   Pt admitted from [facility] with chronic right hip wound. Pt was previously admitted from [facility] and sent to [facility] for wound care and IV AB. Pt for surgery today.     Action/Plan:   CM and CSW to follow for discharge disposition.   Anticipated DC Date:  02/11/2012   Anticipated DC Plan:  SKILLED NURSING FACILITY  In-house referral  Clinical Social Worker      DC Planning Services  CM consult      Choice offered to / List presented to:             Status of service:  Completed, signed off Medicare Important Message given?  YES (If response is "NO", the following Medicare IM given date fields will be blank) Date Medicare IM given:  02/12/2012 Date Additional Medicare IM given:    Discharge Disposition:    Per UR Regulation:    If discussed at Long Length of Stay Meetings, dates discussed:   02/12/2012    Comments:  02/12/12 1033 Arlyss Queen, RN BSN CM Pt discharged to [facility] today. CSW to arrange discharge to facility.  02/07/12 1027 Arlyss Queen, RN BSN CM

## 2012-02-07 NOTE — Progress Notes (Signed)
Waiting on dr arrival

## 2012-02-08 ENCOUNTER — Encounter (HOSPITAL_COMMUNITY)
Admission: AD | Disposition: A | Discharge status: Skilled Nursing Facility | Payer: Self-pay | Source: Ambulatory Visit | Attending: Internal Medicine

## 2012-02-08 ENCOUNTER — Encounter (HOSPITAL_COMMUNITY): Payer: Self-pay | Admitting: *Deleted

## 2012-02-08 ENCOUNTER — Inpatient Hospital Stay (HOSPITAL_COMMUNITY): Payer: Medicare Other | Admitting: Anesthesiology

## 2012-02-08 ENCOUNTER — Encounter (HOSPITAL_COMMUNITY): Payer: Self-pay | Admitting: Anesthesiology

## 2012-02-08 DIAGNOSIS — T8450XA Infection and inflammatory reaction due to unspecified internal joint prosthesis, initial encounter: Secondary | ICD-10-CM

## 2012-02-08 DIAGNOSIS — S72009A Fracture of unspecified part of neck of unspecified femur, initial encounter for closed fracture: Secondary | ICD-10-CM

## 2012-02-08 DIAGNOSIS — L02419 Cutaneous abscess of limb, unspecified: Secondary | ICD-10-CM

## 2012-02-08 DIAGNOSIS — T8140XA Infection following a procedure, unspecified, initial encounter: Principal | ICD-10-CM

## 2012-02-08 HISTORY — PX: INCISION AND DRAINAGE HIP: SHX1801

## 2012-02-08 SURGERY — IRRIGATION AND DEBRIDEMENT HIP
Anesthesia: Spinal | Site: Hip | Laterality: Right | Wound class: Dirty or Infected

## 2012-02-08 MED ORDER — ONDANSETRON HCL 4 MG/2ML IJ SOLN
INTRAMUSCULAR | Status: AC
  Filled 2012-02-08: qty 2

## 2012-02-08 MED ORDER — MIDAZOLAM HCL 2 MG/2ML IJ SOLN
1.0000 mg | INTRAMUSCULAR | Status: DC | PRN
  Administered 2012-02-08 (×2): 2 mg via INTRAVENOUS

## 2012-02-08 MED ORDER — HYDROMORPHONE HCL PF 1 MG/ML IJ SOLN
0.5000 mg | INTRAMUSCULAR | Status: DC | PRN
  Administered 2012-02-08 – 2012-02-11 (×10): 0.5 mg via INTRAVENOUS
  Filled 2012-02-08 (×11): qty 1

## 2012-02-08 MED ORDER — FENTANYL CITRATE 0.05 MG/ML IJ SOLN: 25.0000 ug | Freq: Once | INTRAMUSCULAR | Status: DC

## 2012-02-08 MED ORDER — PROPOFOL 10 MG/ML IV EMUL
INTRAVENOUS | Status: AC
  Filled 2012-02-08: qty 20

## 2012-02-08 MED ORDER — METOCLOPRAMIDE HCL 5 MG/ML IJ SOLN: 5.0000 mg | Freq: Three times a day (TID) | INTRAMUSCULAR | Status: DC | PRN

## 2012-02-08 MED ORDER — SODIUM CHLORIDE 0.9 % IR SOLN
Status: DC | PRN
  Administered 2012-02-08: 1000 mL

## 2012-02-08 MED ORDER — FENTANYL CITRATE 0.05 MG/ML IJ SOLN
25.0000 ug | INTRAMUSCULAR | Status: DC | PRN
  Administered 2012-02-08: 25 ug via INTRAVENOUS
  Administered 2012-02-08 (×2): 50 ug via INTRAVENOUS
  Administered 2012-02-08: 25 ug via INTRAVENOUS

## 2012-02-08 MED ORDER — BUPIVACAINE-EPINEPHRINE 0.5% -1:200000 IJ SOLN
INTRAMUSCULAR | Status: DC | PRN
  Administered 2012-02-08: 60 mL

## 2012-02-08 MED ORDER — HYDROCODONE-ACETAMINOPHEN 7.5-325 MG PO TABS
1.0000 | ORAL_TABLET | Freq: Four times a day (QID) | ORAL | Status: DC
  Administered 2012-02-08 – 2012-02-12 (×15): 1 via ORAL
  Filled 2012-02-08 (×15): qty 1

## 2012-02-08 MED ORDER — DIPHENHYDRAMINE HCL 12.5 MG/5ML PO ELIX: 12.5000 mg | ORAL_SOLUTION | ORAL | Status: DC | PRN

## 2012-02-08 MED ORDER — ONDANSETRON HCL 4 MG/2ML IJ SOLN: 4.0000 mg | Freq: Once | INTRAMUSCULAR | Status: DC | PRN

## 2012-02-08 MED ORDER — MIDAZOLAM HCL 2 MG/2ML IJ SOLN: 1.0000 mg | INTRAMUSCULAR | Status: DC | PRN

## 2012-02-08 MED ORDER — METOCLOPRAMIDE HCL 10 MG PO TABS: 5.0000 mg | ORAL_TABLET | Freq: Three times a day (TID) | ORAL | Status: DC | PRN

## 2012-02-08 MED ORDER — FENTANYL CITRATE 0.05 MG/ML IJ SOLN
INTRAMUSCULAR | Status: AC
  Filled 2012-02-08: qty 2

## 2012-02-08 MED ORDER — BISACODYL 5 MG PO TBEC: 5.0000 mg | DELAYED_RELEASE_TABLET | Freq: Every day | ORAL | Status: DC | PRN

## 2012-02-08 MED ORDER — ONDANSETRON HCL 4 MG/2ML IJ SOLN: 4.0000 mg | Freq: Four times a day (QID) | INTRAMUSCULAR | Status: DC | PRN

## 2012-02-08 MED ORDER — EPHEDRINE SULFATE 50 MG/ML IJ SOLN
INTRAMUSCULAR | Status: AC
  Filled 2012-02-08: qty 1

## 2012-02-08 MED ORDER — BUPIVACAINE IN DEXTROSE 0.75-8.25 % IT SOLN
INTRATHECAL | Status: AC
  Filled 2012-02-08: qty 2

## 2012-02-08 MED ORDER — DOCUSATE SODIUM 100 MG PO CAPS
100.0000 mg | ORAL_CAPSULE | Freq: Two times a day (BID) | ORAL | Status: DC
  Administered 2012-02-08 – 2012-02-12 (×8): 100 mg via ORAL
  Filled 2012-02-08 (×8): qty 1

## 2012-02-08 MED ORDER — MENTHOL 3 MG MT LOZG: 1.0000 | LOZENGE | OROMUCOSAL | Status: DC | PRN

## 2012-02-08 MED ORDER — SODIUM CHLORIDE 0.9 % IV SOLN
INTRAVENOUS | Status: DC
  Administered 2012-02-08 – 2012-02-09 (×2): via INTRAVENOUS

## 2012-02-08 MED ORDER — PROPOFOL INFUSION 10 MG/ML OPTIME
INTRAVENOUS | Status: DC | PRN
  Administered 2012-02-08: 50 ug/kg/min via INTRAVENOUS

## 2012-02-08 MED ORDER — LACTATED RINGERS IV SOLN
INTRAVENOUS | Status: DC | PRN
  Administered 2012-02-08 (×2): via INTRAVENOUS

## 2012-02-08 MED ORDER — EPHEDRINE SULFATE 50 MG/ML IJ SOLN
INTRAMUSCULAR | Status: DC | PRN
  Administered 2012-02-08: 5 mg via INTRAVENOUS

## 2012-02-08 MED ORDER — ENOXAPARIN SODIUM 30 MG/0.3ML ~~LOC~~ SOLN
30.0000 mg | Freq: Two times a day (BID) | SUBCUTANEOUS | Status: DC
  Administered 2012-02-09 – 2012-02-12 (×7): 30 mg via SUBCUTANEOUS
  Filled 2012-02-08 (×7): qty 0.3

## 2012-02-08 MED ORDER — ALUM & MAG HYDROXIDE-SIMETH 200-200-20 MG/5ML PO SUSP: 30.0000 mL | ORAL | Status: DC | PRN

## 2012-02-08 MED ORDER — LACTATED RINGERS IV SOLN
INTRAVENOUS | Status: DC
  Administered 2012-02-08: 1000 mL via INTRAVENOUS

## 2012-02-08 MED ORDER — PHENOL 1.4 % MT LIQD: 1.0000 | OROMUCOSAL | Status: DC | PRN

## 2012-02-08 MED ORDER — SENNOSIDES-DOCUSATE SODIUM 8.6-50 MG PO TABS: 1.0000 | ORAL_TABLET | Freq: Every evening | ORAL | Status: DC | PRN

## 2012-02-08 MED ORDER — MIDAZOLAM HCL 2 MG/2ML IJ SOLN
INTRAMUSCULAR | Status: AC
  Filled 2012-02-08: qty 2

## 2012-02-08 MED ORDER — LIDOCAINE HCL (CARDIAC) 10 MG/ML IV SOLN
INTRAVENOUS | Status: DC | PRN
  Administered 2012-02-08: 50 mg via INTRAVENOUS

## 2012-02-08 MED ORDER — FENTANYL CITRATE 0.05 MG/ML IJ SOLN
INTRAMUSCULAR | Status: DC | PRN
  Administered 2012-02-08: 20 ug via INTRATHECAL
  Administered 2012-02-08 (×2): 25 ug via INTRAVENOUS

## 2012-02-08 MED ORDER — BUPIVACAINE-EPINEPHRINE PF 0.5-1:200000 % IJ SOLN
INTRAMUSCULAR | Status: AC
  Filled 2012-02-08: qty 20

## 2012-02-08 MED ORDER — BUPIVACAINE IN DEXTROSE 0.75-8.25 % IT SOLN
INTRATHECAL | Status: DC | PRN
  Administered 2012-02-08: 1.8 mL via INTRATHECAL

## 2012-02-08 MED ORDER — ONDANSETRON HCL 4 MG PO TABS: 4.0000 mg | ORAL_TABLET | Freq: Four times a day (QID) | ORAL | Status: DC | PRN

## 2012-02-08 MED ORDER — FENTANYL CITRATE 0.05 MG/ML IJ SOLN
INTRAMUSCULAR | Status: AC
  Administered 2012-02-08: 50 ug via INTRAVENOUS
  Filled 2012-02-08: qty 2

## 2012-02-08 MED ORDER — ONDANSETRON HCL 4 MG/2ML IJ SOLN
4.0000 mg | Freq: Once | INTRAMUSCULAR | Status: AC | PRN
  Administered 2012-02-08: 4 mg via INTRAVENOUS

## 2012-02-08 MED ORDER — VANCOMYCIN HCL IN DEXTROSE 1-5 GM/200ML-% IV SOLN
1000.0000 mg | Freq: Two times a day (BID) | INTRAVENOUS | Status: AC
  Administered 2012-02-08 – 2012-02-10 (×4): 1000 mg via INTRAVENOUS
  Filled 2012-02-08 (×4): qty 200

## 2012-02-08 MED ORDER — SODIUM CHLORIDE 0.9 % IR SOLN
Status: DC | PRN
  Administered 2012-02-08: 3000 mL

## 2012-02-08 MED ORDER — FENTANYL CITRATE 0.05 MG/ML IJ SOLN: 25.0000 ug | INTRAMUSCULAR | Status: DC | PRN

## 2012-02-08 MED ORDER — SENNA 8.6 MG PO TABS
1.0000 | ORAL_TABLET | Freq: Two times a day (BID) | ORAL | Status: DC
  Administered 2012-02-08 – 2012-02-12 (×8): 8.6 mg via ORAL
  Filled 2012-02-08 (×8): qty 1

## 2012-02-08 MED ORDER — LIDOCAINE HCL (PF) 1 % IJ SOLN
INTRAMUSCULAR | Status: AC
  Filled 2012-02-08: qty 5

## 2012-02-08 MED ORDER — MAGNESIUM CITRATE PO SOLN: 1.0000 | Freq: Once | ORAL | Status: AC | PRN

## 2012-02-08 SURGICAL SUPPLY — 46 items
BAG HAMPER (MISCELLANEOUS) ×2 IMPLANT
CLOTH BEACON ORANGE TIMEOUT ST (SAFETY) ×2 IMPLANT
COVER LIGHT HANDLE STERIS (MISCELLANEOUS) ×4 IMPLANT
DRAPE HIP W/POCKET STRL (DRAPE) ×2 IMPLANT
DRAPE U-SHAPE 47X51 STRL (DRAPES) ×2 IMPLANT
DRSG MEPILEX BORDER 4X12 (GAUZE/BANDAGES/DRESSINGS) ×1 IMPLANT
ELECT REM PT RETURN 9FT ADLT (ELECTROSURGICAL) ×2
ELECTRODE REM PT RTRN 9FT ADLT (ELECTROSURGICAL) ×1 IMPLANT
EVACUATOR 3/16  PVC DRAIN (DRAIN) ×1
EVACUATOR 3/16 PVC DRAIN (DRAIN) IMPLANT
GAUZE PACKING IODOFORM 1/2 (PACKING) ×1 IMPLANT
GLOVE BIOGEL PI IND STRL 7.0 (GLOVE) IMPLANT
GLOVE BIOGEL PI INDICATOR 7.0 (GLOVE) ×3
GLOVE EXAM NITRILE MD LF STRL (GLOVE) ×1 IMPLANT
GLOVE SKINSENSE NS SZ8.0 LF (GLOVE) ×1
GLOVE SKINSENSE STRL SZ8.0 LF (GLOVE) ×1 IMPLANT
GLOVE SS BIOGEL STRL SZ 6.5 (GLOVE) IMPLANT
GLOVE SS N UNI LF 8.5 STRL (GLOVE) ×2 IMPLANT
GLOVE SUPERSENSE BIOGEL SZ 6.5 (GLOVE) ×1
GOWN STRL REIN XL XLG (GOWN DISPOSABLE) ×5 IMPLANT
HANDPIECE INTERPULSE COAX TIP (DISPOSABLE) ×2
INST SET MINOR BONE (KITS) ×2 IMPLANT
IV NS IRRIG 3000ML ARTHROMATIC (IV SOLUTION) ×1 IMPLANT
KIT ROOM TURNOVER APOR (KITS) ×2 IMPLANT
MANIFOLD NEPTUNE II (INSTRUMENTS) ×2 IMPLANT
MARKER SKIN DUAL TIP RULER LAB (MISCELLANEOUS) ×2 IMPLANT
NS IRRIG 1000ML POUR BTL (IV SOLUTION) ×2 IMPLANT
PACK TOTAL JOINT (CUSTOM PROCEDURE TRAY) ×2 IMPLANT
PAD ABD 5X9 TENDERSORB (GAUZE/BANDAGES/DRESSINGS) IMPLANT
PAD ARMBOARD 7.5X6 YLW CONV (MISCELLANEOUS) ×2 IMPLANT
SET BASIN LINEN APH (SET/KITS/TRAYS/PACK) ×2 IMPLANT
SET HNDPC FAN SPRY TIP SCT (DISPOSABLE) IMPLANT
SOL PREP PROV IODINE SCRUB 4OZ (MISCELLANEOUS) ×1 IMPLANT
SPONGE DRAIN TRACH 4X4 STRL 2S (GAUZE/BANDAGES/DRESSINGS) ×1 IMPLANT
SPONGE GAUZE 4X4 12PLY (GAUZE/BANDAGES/DRESSINGS) ×1 IMPLANT
STAPLER VISISTAT 35W (STAPLE) ×1 IMPLANT
SUT BRALON NAB BRD #1 30IN (SUTURE) ×1 IMPLANT
SUT ETHILON 3 0 FSL (SUTURE) IMPLANT
SUT MNCRL 0 VIOLET CTX 36 (SUTURE) IMPLANT
SUT MON AB 2-0 CT1 36 (SUTURE) IMPLANT
SUT MONOCRYL 0 CTX 36 (SUTURE) ×2
SWAB CULTURE LIQ STUART DBL (MISCELLANEOUS) ×2 IMPLANT
SYR BULB IRRIGATION 50ML (SYRINGE) ×2 IMPLANT
TAPE CLOTH SURG 4X10 WHT LF (GAUZE/BANDAGES/DRESSINGS) ×1 IMPLANT
TUBE ANAEROBIC PORT A CUL  W/M (MISCELLANEOUS) ×3 IMPLANT
YANKAUER SUCT 12FT TUBE ARGYLE (SUCTIONS) ×1 IMPLANT

## 2012-02-08 NOTE — Brief Op Note (Addendum)
02/06/2012 - 02/08/2012  3:38 PM  PATIENT:  [name]  66 y.o. female  PRE-OPERATIVE DIAGNOSIS:  hip infection  POST-OPERATIVE DIAGNOSIS:  Superficial wound infection, proximal migration of hip implant  Operative findings: #1 no superficial subcutaneous fat suture granuloma #2 the prosthesis was stable #3 the prosthesis has migrated proximally into the acetabulum superiorly not immediately #4 heterotopic bone    PROCEDURE:  Procedure(s) (LRB) with comments: IRRIGATION AND DEBRIDEMENT HIP (Right)  Details of procedure The surgical site was confirmed as right hip and marked as such in the preop area. Chart review was performed.  Patient was taken to the operating for spinal anesthetic. She was placed in the lateral decubitus position with an axillary roll right side up left side down  She was prepped and draped sterilely. Timeout was executed.  The previous incision was marked entered. Immediately noted was a suture granuloma that extended through the subcutaneous fat down to but not through the fascia  This was debrided. Cultures were obtained and irrigation was performed. The sharp debridement of the granulomatous area was sent for culture  After irrigation we further deep in the incision and subperiosteally removed the soft tissues around the proximal femur entering the gluteus musculature with blunt dissection. We made a capsulotomy which was significantly scarred and scar tissue had to be excised. We took deep cultures of the joint. The prosthesis was inspected and was found to be intact with no loosening  The deep tissues were irrigated and the closure was done with 2 layers of 0 Monocryl. A portion of the wound which was draining was debrided and left open and packed with a iodoform gauze  Hemovac drain was placed in the deep tissue.  Dressing was applied  Patient was taken to recovery in stable condition  Postoperative plan broad-spectrum antibiotics will be  given until cultures are obtained.  The patient will then need referral to a reconstruction specialist to reconstruct the hip to total hip this will somewhat depend on the patients ambulatory status overall health and whether or not the wound grows back any bacteria. SURGEON:  Surgeon(s) and Role:    * Bettie Swavely E Juletta Berhe, MD - Primary  PHYSICIAN ASSISTANT:   ASSISTANTS: Zhanna ashley    ANESTHESIA:   spinal  EBL:  Total I/O In: 1000 [I.V.:1000] Out: 100 [Blood:100]  BLOOD ADMINISTERED:none  DRAINS: 1 large hemovac    LOCAL MEDICATIONS USED:  MARCAINE   , Amount: 30 ml and OTHER epi  SPECIMEN:  Source of Specimen:  tissue culture and deep (Joint) and superficial wound culture   DISPOSITION OF SPECIMEN:  micro  COUNTS:  YES  TOURNIQUET:  * No tourniquets in log *  DICTATION: .Dragon Dictation  PLAN OF CARE: Admit to inpatient   PATIENT DISPOSITION:  PACU - hemodynamically stable.   Delay start of Pharmacological VTE agent (>24hrs) due to surgical blood loss or risk of bleeding: yes 

## 2012-02-08 NOTE — Progress Notes (Signed)
Patient was complaining of nausea. Doctor was notified and new orders were given for Zofran. Will continue to monitor patient.

## 2012-02-08 NOTE — Anesthesia Postprocedure Evaluation (Signed)
  Anesthesia Post-op Note  Patient: [name]  Procedure(s) Performed: Procedure(s) (LRB) with comments: IRRIGATION AND DEBRIDEMENT HIP (Right)  Patient Location: PACU  Anesthesia Type: spinal  Level of Consciousness: awake, alert , oriented and patient cooperative  Airway and Oxygen Therapy: Patient Spontanous Breathing oxygen on nasal cannula  Post-op Pain: mild  Post-op Assessment: Post-op Vital signs reviewed, Patient's Cardiovascular Status Stable, Respiratory Function Stable, Patent Airway and No signs of Nausea or vomiting  Post-op Vital Signs: Reviewed and stable  Complications: No apparent anesthesia complications

## 2012-02-08 NOTE — Clinical Social Work Psychosocial (Signed)
Clinical Social Work Department BRIEF PSYCHOSOCIAL ASSESSMENT 02/08/2012  Patient:  [name], [name]     Account Number:  [id number]     Admit date:  02/06/2012  Clinical Social Worker:  Nancie Neas  Date/Time:  02/08/2012 03:30 PM  Referred by:  Care Management  Date Referred:  02/08/2012 Referred for  SNF Placement   Other Referral:   Interview type:  Other - See comment Other interview type:   guardian- Amelia    PSYCHOSOCIAL DATA Living Status:  FACILITY Admitted from facility:   Level of care:  Long Term Acute Primary support name:  Lauris Poag Primary support relationship to patient:  NONE Degree of support available:   Lauris Poag is legal guardian    CURRENT CONCERNS Current Concerns  Post-Acute Placement   Other Concerns:    SOCIAL WORK ASSESSMENT / PLAN CSW spoke with pt's legal guardian Lauris Poag with [facility] regarding d/c planning. Pt declared incompetant by court several years ago per [place]. Pt admitted from [facility] for wound infection. Anticipated need for SNF at d/c. CSW explained probable need for SNF and given okay to begin bed search. CSW left voicemail with Lauris Poag to receive permission to contact Lupita Leash, who is a family member listed on chart. Awaiting call back.   Assessment/plan status:  Psychosocial Support/Ongoing Assessment of Needs Other assessment/ plan:   Information/referral to community resources:   SNF    PATIENT'S/FAMILY'S RESPONSE TO PLAN OF CARE: Pt in surgery now. Amelia agreeable to SNF. CSW will follow up with bed offers when available.        Derenda Fennel, [place] [phone]

## 2012-02-08 NOTE — Progress Notes (Signed)
Subjective:  Patient is rescheduled for [place] because consent could not obtained yesterday for the surgery.  Objective: Vital signs in last 24 hours: Temp:  [97.9 F (36.6 C)-99.1 F (37.3 C)] 97.9 F (36.6 C) (11/15 0701) Pulse Rate:  [68-84] 84  (11/15 0701) Resp:  [9-20] 20  (11/15 0701) BP: (95-129)/(55-107) 104/66 mmHg (11/15 0701) SpO2:  [95 %-100 %] 95 % (11/15 0701) Weight change:  Last BM Date: 02/05/12  Intake/Output from previous day: 11/14 0701 - 11/15 0700 In: 240 [P.O.:240] Out: 550 [Urine:550]  PHYSICAL EXAM General appearance: alert and no distress Resp: clear to auscultation bilaterally Cardio: S1, S2 normal GI: soft, non-tender; bowel sounds normal; no masses,  no organomegaly Extremities: small wound with pussy drainage on the rt hip  Lab Results:    @labtest @ [facility] No results found for this basename: PHART,PCO2,PO2ART,TCO2,HCO3 in the last 72 hours CULTURES Recent Results (from the past 240 hour(s))  SURGICAL PCR SCREEN     Status: Normal   Collection Time   02/06/12 10:48 PM      Component Value Range Status Comment   MRSA, PCR NEGATIVE  NEGATIVE Final    Staphylococcus aureus NEGATIVE  NEGATIVE Final    Studies/Results: Dg Pelvis Portable  02/06/2012  *RADIOLOGY REPORT*  Clinical Data: Preoperative exam for surgery to remove infection  PORTABLE PELVIS  Comparison: 05/01/2011.  08/23/2011.  Findings: The left hip and left hemi pelvis appear normal.  There is subsidence of the bipolar hip replacement into the acetabulum with sclerotic change.  By history, this could be consistent with osteomyelitis and osteomalacia.  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  Clinical history states infection and the findings could certainly go along with that.   Original Report Authenticated By: Paulina Fusi, M.D.    Dg Chest Portable 1 View  02/06/2012  *RADIOLOGY REPORT*  Clinical Data: Preoperative  respiratory exam for pelvic surgery.  PORTABLE CHEST - 1 VIEW  Comparison: 01/23/2012  Findings: Right arm PICC has its tip in the SVC 6 cm above the right atrium.  Heart size is normal.  Mediastinal shadows are normal.  Lungs are clear except for mild linear atelectasis at the left base.  Old healed rib fractures on the left.  IMPRESSION: Mild linear atelectasis left base.  Otherwise no active disease.   Original Report Authenticated By: Paulina Fusi, M.D.     Medications: I have reviewed the patient's current medications.  Assesment:  Active Problems:  * No active hospital problems. *   Infected rt hip prosthesis  2. Rt hip wound  3. S/P hip replacement  4. IBS  5.Anemia  6.Hypokalemia  7. Depression  8. Psychosis   Plan: Patient is rescheduled for surgery to today Continue regular treatment.    LOS: 2 days   [name] 02/08/2012, 8:07 AM

## 2012-02-08 NOTE — Preoperative (Signed)
Beta Blockers   Reason not to administer Beta Blockers:Not Applicable 

## 2012-02-08 NOTE — Clinical Social Work Placement (Signed)
Clinical Social Work Department CLINICAL SOCIAL WORK PLACEMENT NOTE 02/08/2012  Patient:  [name], [name]  Account Number:  [id number] Admit date:  02/06/2012  Clinical Social Worker:  Derenda Fennel, LCSW  Date/time:  02/08/2012 04:15 PM  Clinical Social Work is seeking post-discharge placement for this patient at the following level of care:   SKILLED NURSING   (*CSW will update this form in Epic as items are completed)   02/08/2012  Patient/family provided with [facility] Department of Clinical Social Work's list of facilities offering this level of care within the geographic area requested by the patient (or if unable, by the patient's family).  02/08/2012  Patient/family informed of their freedom to choose among providers that offer the needed level of care, that participate in Medicare, Medicaid or managed care program needed by the patient, have an available bed and are willing to accept the patient.  02/08/2012  Patient/family informed of [facility]' ownership interest in [facility], as well as of the fact that they are under no obligation to receive care at this facility.  PASARR submitted to EDS on  PASARR number received from EDS on   FL2 transmitted to all facilities in geographic area requested by pt/family on  02/08/2012 FL2 transmitted to all facilities within larger geographic area on   Patient informed that his/her managed care company has contracts with or will negotiate with  certain facilities, including the following:     Patient/family informed of bed offers received:   Patient chooses bed at  Physician recommends and patient chooses bed at    Patient to be transferred to  on   Patient to be transferred to facility by   The following physician request were entered in Epic:   Additional Comments: existing pasarr.  Derenda Fennel, [place] [phone]

## 2012-02-08 NOTE — Transfer of Care (Signed)
  Anesthesia Post-op Note  Patient: [name]  Procedure(s) Performed: Procedure(s) (LRB) with comments: IRRIGATION AND DEBRIDEMENT HIP (Right)  Patient Location: PACU  Anesthesia Type: spinal  Level of Consciousness: awake, alert , oriented and patient cooperative  Airway and Oxygen Therapy: Patient Spontanous Breathing and Patient connected to nasal cannula ce mask oxygen Post-op Pain: mild  Post-op Assessment: Post-op Vital signs reviewed, Patient's Cardiovascular Status Stable, Respiratory Function Stable, Patent Airway and No signs of Nausea or vomiting  Post-op Vital Signs: Reviewed and stable  Complications: No apparent anesthesia complications

## 2012-02-08 NOTE — Progress Notes (Signed)
From OR. Awake. Talking. Rt pedal pulse palpable. Moves rt toes without difficulty.

## 2012-02-08 NOTE — Anesthesia Procedure Notes (Addendum)
Date/Time: 02/08/2012 1:42 PM Performed by: Carolyne Littles, AMY L Pre-anesthesia Checklist: Patient identified, Timeout performed, Emergency Drugs available, Suction available and Patient being monitored Patient Re-evaluated:Patient Re-evaluated prior to inductionOxygen Delivery Method: Non-rebreather mask   Spinal  Patient location during procedure: OR Start time: 02/08/2012 1:58 PM Staffing CRNA/Resident: ANDRAZA, AMY L Preanesthetic Checklist Completed: patient identified, site marked, surgical consent, pre-op evaluation, timeout performed, IV checked, risks and benefits discussed and monitors and equipment checked Spinal Block Patient position: left lateral decubitus Prep: Betadine Patient monitoring: heart rate, cardiac monitor, continuous pulse ox and blood pressure Approach: left paramedian Location: L3-4 Injection technique: single-shot Needle Needle type: Spinocan  Needle gauge: 22 G Needle length: 9 cm Assessment Sensory level: T8 Additional Notes  ATTEMPTS:1 TRAY NW:[id number] TRAY EXPIRATION DATE:02/2013 Marcaine.75%1.8cc,fentanyl 20 mcg,epi.1 injected at 1358 intrathecally; patient tolerated well.

## 2012-02-08 NOTE — Op Note (Signed)
02/06/2012 - 02/08/2012  3:38 PM  PATIENT:  [name]  66 y.o. female  PRE-OPERATIVE DIAGNOSIS:  hip infection  POST-OPERATIVE DIAGNOSIS:  Superficial wound infection, proximal migration of hip implant  Operative findings: #1 no superficial subcutaneous fat suture granuloma #2 the prosthesis was stable #3 the prosthesis has migrated proximally into the acetabulum superiorly not immediately #4 heterotopic bone    PROCEDURE:  Procedure(s) (LRB) with comments: IRRIGATION AND DEBRIDEMENT HIP (Right)  Details of procedure The surgical site was confirmed as right hip and marked as such in the preop area. Chart review was performed.  Patient was taken to the operating for spinal anesthetic. She was placed in the lateral decubitus position with an axillary roll right side up left side down  She was prepped and draped sterilely. Timeout was executed.  The previous incision was marked entered. Immediately noted was a suture granuloma that extended through the subcutaneous fat down to but not through the fascia  This was debrided. Cultures were obtained and irrigation was performed. The sharp debridement of the granulomatous area was sent for culture  After irrigation we further deep in the incision and subperiosteally removed the soft tissues around the proximal femur entering the gluteus musculature with blunt dissection. We made a capsulotomy which was significantly scarred and scar tissue had to be excised. We took deep cultures of the joint. The prosthesis was inspected and was found to be intact with no loosening  The deep tissues were irrigated and the closure was done with 2 layers of 0 Monocryl. A portion of the wound which was draining was debrided and left open and packed with a iodoform gauze  Hemovac drain was placed in the deep tissue.  Dressing was applied  Patient was taken to recovery in stable condition  Postoperative plan broad-spectrum antibiotics will be  given until cultures are obtained.  The patient will then need referral to a reconstruction specialist to reconstruct the hip to total hip this will somewhat depend on the patients ambulatory status overall health and whether or not the wound grows back any bacteria. SURGEON:  Surgeon(s) and Role:    * Vickki Hearing, MD - Primary  PHYSICIAN ASSISTANT:   ASSISTANTS: Kymberly ashley    ANESTHESIA:   spinal  EBL:  Total I/O In: 1000 [I.V.:1000] Out: 100 [Blood:100]  BLOOD ADMINISTERED:none  DRAINS: 1 large hemovac    LOCAL MEDICATIONS USED:  MARCAINE   , Amount: 30 ml and OTHER epi  SPECIMEN:  Source of Specimen:  tissue culture and deep (Joint) and superficial wound culture   DISPOSITION OF SPECIMEN:  micro  COUNTS:  YES  TOURNIQUET:  * No tourniquets in log *  DICTATION: .Dragon Dictation  PLAN OF CARE: Admit to inpatient   PATIENT DISPOSITION:  PACU - hemodynamically stable.   Delay start of Pharmacological VTE agent (>24hrs) due to surgical blood loss or risk of bleeding: yes

## 2012-02-08 NOTE — Progress Notes (Signed)
Awake. C/O sharp chest pain. Denies nausea. No emesis. Skin warm and dry. States "my chest was hurting before surgery." Med as noted.

## 2012-02-08 NOTE — Anesthesia Preprocedure Evaluation (Signed)
Anesthesia Evaluation  Patient identified by MRN, date of birth, ID band Patient awake    Reviewed: Allergy & Precautions, H&P , NPO status , Patient's Chart, lab work & pertinent test results  History of Anesthesia Complications Negative for: history of anesthetic complications  Airway Mallampati: II      Dental  (+) Edentulous Upper and Edentulous Lower   Pulmonary  breath sounds clear to auscultation        Cardiovascular negative cardio ROS  Rhythm:Regular Rate:Normal     Neuro/Psych PSYCHIATRIC DISORDERS (dementia) Depression    GI/Hepatic GERD-  Controlled and Medicated,  Endo/Other    Renal/GU      Musculoskeletal   Abdominal   Peds  Hematology   Anesthesia Other Findings   Reproductive/Obstetrics                           Anesthesia Physical Anesthesia Plan  ASA: III  Anesthesia Plan: Spinal   Post-op Pain Management:    Induction:   Airway Management Planned: Nasal Cannula  Additional Equipment:   Intra-op Plan:   Post-operative Plan:   Informed Consent: I have reviewed the patients History and Physical, chart, labs and discussed the procedure including the risks, benefits and alternatives for the proposed anesthesia with the patient or authorized representative who has indicated his/her understanding and acceptance.     Plan Discussed with:   Anesthesia Plan Comments:         Anesthesia Quick Evaluation  

## 2012-02-09 LAB — CBC
Hemoglobin: 8.6 g/dL — ABNORMAL LOW (ref 12.0–15.0)
MCH: 25.5 pg — ABNORMAL LOW (ref 26.0–34.0)
RBC: 3.37 MIL/uL — ABNORMAL LOW (ref 3.87–5.11)

## 2012-02-09 LAB — BASIC METABOLIC PANEL
CO2: 28 mEq/L (ref 19–32)
Calcium: 8.9 mg/dL (ref 8.4–10.5)
Chloride: 105 mEq/L (ref 96–112)
Glucose, Bld: 98 mg/dL (ref 70–99)
Sodium: 141 mEq/L (ref 135–145)

## 2012-02-09 NOTE — Evaluation (Signed)
Physical Therapy Evaluation Patient Details Name: [name] MRN: [id number] DOB: 01/23/1946 Today's Date: 02/09/2012 Time: 3664-4034 PT Time Calculation (min): 24 min  PT Assessment / Plan / Recommendation Clinical Impression  Pt is a 66 year old female s/p R hip IRRIGATION AND DEBRIDEMENT and is referred to PT for gait and general exercises.  At this time she requires mod A for most mobility and expreses increased fear with is her greatest limiiting factor which may result in greater deconditioning.      PT Assessment  Patient needs continued PT services    Follow Up Recommendations  SNF    Does the patient have the potential to tolerate intense rehabilitation      Barriers to Discharge Inaccessible home environment Pt comes from SNF    Equipment Recommendations  None recommended by PT    Recommendations for Other Services     Frequency Min 3X/week    Precautions / Restrictions Restrictions RLE Weight Bearing: Weight bearing as tolerated   Pertinent Vitals/Pain 10/10 to R hip.  Pt given pain medication before tx      Mobility  Bed Mobility Bed Mobility: Sit to Supine;Supine to Sit;Scooting to [facility];Sitting - Scoot to Edge of Bed Supine to Sit: 6: Modified independent (Device/Increase time);HOB elevated Sitting - Scoot to Edge of Bed: 5: Supervision;With rail Sit to Supine: 4: Min assist;With rail;HOB flat Scooting to HOB: 4: Min assist;With rail Details for Bed Mobility Assistance: cueing for scooting to [facility] for proper technique Transfers Transfers: Sit to Stand;Stand to Sit Sit to Stand: 3: Mod assist;From bed Stand to Sit: 3: Mod assist;To bed Details for Transfer Assistance: completed 2x for activity tolerance.  Pt reports increased fear when encouraged to walk.  Ambulation/Gait Ambulation/Gait Assistance: Not tested (comment) (expressed significant fear. )    Shoulder Instructions     Exercises General Exercises - Lower Extremity Ankle Circles/Pumps:  AROM;Both;20 reps;Supine Quad Sets: AROM;Right;10 reps;Supine Gluteal Sets: AROM;Both;10 reps;Supine Long Arc Quad: AROM;Both;10 reps;Seated Heel Slides: AROM;AAROM;Right;Left;10 reps;Supine;Strengthening Hip ABduction/ADduction: AROM;AAROM;Strengthening;Both;10 reps Straight Leg Raises: AROM;AAROM;Strengthening;Both;10 reps;Supine Hip Flexion/Marching: AROM;Strengthening;Both;10 reps;Seated   PT Diagnosis: Difficulty walking;Generalized weakness;Acute pain  PT Problem List: Decreased strength;Decreased activity tolerance;Decreased balance;Decreased mobility;Pain PT Treatment Interventions: Gait training;Stair training   PT Goals Acute Rehab PT Goals PT Goal Formulation: With patient Potential to Achieve Goals: Good Pt will go Supine/Side to Sit: with min assist;with HOB not 0 degrees (comment degree) PT Goal: Supine/Side to Sit - Progress: Goal set today Pt will go Sit to Supine/Side: with supervision PT Goal: Sit to Supine/Side - Progress: Goal set today Pt will go Stand to Sit: with min assist;with upper extremity assist PT Goal: Stand to Sit - Progress: Goal set today Pt will Transfer Bed to Chair/Chair to Bed: with min assist PT Transfer Goal: Bed to Chair/Chair to Bed - Progress: Goal set today Pt will Ambulate: 1 - 15 feet;with least restrictive assistive device;with mod assist PT Goal: Ambulate - Progress: Goal set today  Visit Information  Last PT Received On: 02/09/12    Subjective Data  Subjective: Do I have to get up today.     Prior Functioning  Home Living Available Help at Discharge: Skilled Nursing Facility Prior Function Level of Independence: Needs assistance Needs Assistance: Bathing;Dressing;Gait;Transfers Able to Take Stairs?: No Driving: No Vocation: Retired Musician: No difficulties    Cognition       Extremity/Trunk Assessment Right Lower Extremity Assessment RLE ROM/Strength/Tone: Deficits RLE ROM/Strength/Tone Deficits:  hip 3/5, knee 3+/5 Left Lower  Extremity Assessment LLE ROM/Strength/Tone: Within functional levels   Balance Balance Balance Assessed: Yes Static Standing Balance Static Standing - Comment/# of Minutes: Standing 2x2 minutes w/mod A w/cueing to decrease posterior lean.   End of Session PT - End of Session Equipment Utilized During Treatment: Gait belt Activity Tolerance:  (limited by fear) Patient left: in bed;with call bell/phone within reach;with bed alarm set  GP     Evart Mcdonnell, PT 02/09/2012, 1:34 PM

## 2012-02-09 NOTE — Progress Notes (Signed)
Subjective: 1 Day Post-Op Procedure(s) (LRB): IRRIGATION AND DEBRIDEMENT HIP (Right) Patient reports pain as mild.    Objective: Vital signs in last 24 hours: Temp:  [97.9 F (36.6 C)-99 F (37.2 C)] 99 F (37.2 C) (11/16 0548) Pulse Rate:  [62-78] 78  (11/16 0548) Resp:  [8-42] 18  (11/16 0548) BP: (89-120)/(45-80) 94/52 mmHg (11/16 0548) SpO2:  [93 %-100 %] 95 % (11/16 0548)  Intake/Output from previous day: 11/15 0701 - 11/16 0700 In: 2505 [I.V.:1935; IV Piggyback:400] Out: 100 [Blood:100] Intake/Output this shift: Total I/O In: -  Out: 195 [Urine:125; Drains:70]   Basename 02/09/12 0529 02/07/12 0455 02/06/12 2000  HGB 8.6* 9.9* 10.0*    Basename 02/09/12 0529 02/07/12 0455  WBC 6.1 5.2  RBC 3.37* 3.85*  HCT 28.7* 32.5*  PLT 263 299    Basename 02/09/12 0529 02/07/12 0455  NA 141 140  K 3.9 3.8  CL 105 104  CO2 28 27  BUN 11 15  CREATININE 0.72 0.67  GLUCOSE 98 81  CALCIUM 8.9 9.3    Basename 02/06/12 2000  LABPT --  INR 1.03    Neurologically intact Sensation intact distally Intact pulses distally  Assessment/Plan: 1 Day Post-Op Procedure(s) (LRB): IRRIGATION AND DEBRIDEMENT HIP (Right) Advance diet Up with therapy D/C IV fluids Transfuse 1 unit packed red cells  Fuller Canada 02/09/2012, 12:39 PM

## 2012-02-09 NOTE — Progress Notes (Signed)
Subjective: She had surgery yesterday and is apparently going to need to see a reconstruction specialist at some point. She has no other new complaints today. She is on IV antibiotics until the cultures are back  Objective: Vital signs in last 24 hours: Temp:  [97.9 F (36.6 C)-99 F (37.2 C)] 99 F (37.2 C) (11/16 0548) Pulse Rate:  [62-78] 78  (11/16 0548) Resp:  [8-42] 18  (11/16 0548) BP: (89-128)/(45-80) 94/52 mmHg (11/16 0548) SpO2:  [93 %-100 %] 95 % (11/16 0548) Weight change:  Last BM Date: 02/06/12  Intake/Output from previous day: 11/15 0701 - 11/16 0700 In: 2505 [I.V.:1935; IV Piggyback:400] Out: 100 [Blood:100]  PHYSICAL EXAM General appearance: alert, cooperative and mild distress Resp: clear to auscultation bilaterally Cardio: regular rate and rhythm, S1, S2 normal, no murmur, click, rub or gallop GI: soft, non-tender; bowel sounds normal; no masses,  no organomegaly Extremities: Postop  Lab Results:    Basic Metabolic Panel:  Basename 02/09/12 0529 02/07/12 0455  NA 141 140  K 3.9 3.8  CL 105 104  CO2 28 27  GLUCOSE 98 81  BUN 11 15  CREATININE 0.72 0.67  CALCIUM 8.9 9.3  MG -- --  PHOS -- --   Liver Function Tests: No results found for this basename: AST:2,ALT:2,ALKPHOS:2,BILITOT:2,PROT:2,ALBUMIN:2 in the last 72 hours No results found for this basename: LIPASE:2,AMYLASE:2 in the last 72 hours No results found for this basename: AMMONIA:2 in the last 72 hours CBC:  Basename 02/09/12 0529 02/07/12 0455 02/06/12 2000  WBC 6.1 5.2 --  NEUTROABS -- -- 3.8  HGB 8.6* 9.9* --  HCT 28.7* 32.5* --  MCV 85.2 84.4 --  PLT 263 299 --   Cardiac Enzymes: No results found for this basename: CKTOTAL:3,CKMB:3,CKMBINDEX:3,TROPONINI:3 in the last 72 hours BNP: No results found for this basename: PROBNP:3 in the last 72 hours D-Dimer: No results found for this basename: DDIMER:2 in the last 72 hours CBG: No results found for this basename: GLUCAP:6 in  the last 72 hours Hemoglobin A1C: No results found for this basename: HGBA1C in the last 72 hours Fasting Lipid Panel: No results found for this basename: CHOL,HDL,LDLCALC,TRIG,CHOLHDL,LDLDIRECT in the last 72 hours Thyroid Function Tests: No results found for this basename: TSH,T4TOTAL,FREET4,T3FREE,THYROIDAB in the last 72 hours Anemia Panel: No results found for this basename: VITAMINB12,FOLATE,FERRITIN,TIBC,IRON,RETICCTPCT in the last 72 hours Coagulation:  Basename 02/06/12 2000  LABPROT 13.4  INR 1.03   Urine Drug Screen: Drugs of Abuse  No results found for this basename: labopia, cocainscrnur, labbenz, amphetmu, thcu, labbarb    Alcohol Level: No results found for this basename: ETH:2 in the last 72 hours Urinalysis: No results found for this basename: COLORURINE:2,APPERANCEUR:2,LABSPEC:2,PHURINE:2,GLUCOSEU:2,HGBUR:2,BILIRUBINUR:2,KETONESUR:2,PROTEINUR:2,UROBILINOGEN:2,NITRITE:2,LEUKOCYTESUR:2 in the last 72 hours Misc. Labs:  ABGS No results found for this basename: PHART,PCO2,PO2ART,TCO2,HCO3 in the last 72 hours CULTURES Recent Results (from the past 240 hour(s))  SURGICAL PCR SCREEN     Status: Normal   Collection Time   02/06/12 10:48 PM      Component Value Range Status Comment   MRSA, PCR NEGATIVE  NEGATIVE Final    Staphylococcus aureus NEGATIVE  NEGATIVE Final   WOUND CULTURE     Status: Normal (Preliminary result)   Collection Time   02/08/12  3:07 PM      Component Value Range Status Comment   Specimen Description HIP RIGHT SUPERFICIAL WOUND   Final    Special Requests NONE   Final    Gram Stain     Final  Value: FEW WBC PRESENT,BOTH PMN AND MONONUCLEAR     NO SQUAMOUS EPITHELIAL CELLS SEEN     NO ORGANISMS SEEN   Culture PENDING   Incomplete    Report Status PENDING   Incomplete   ANAEROBIC CULTURE     Status: Normal (Preliminary result)   Collection Time   02/08/12  3:07 PM      Component Value Range Status Comment   Specimen Description HIP  RIGHT SUPERFICIAL WOUND   Final    Special Requests NONE   Final    Gram Stain     Final    Value: FEW WBC PRESENT,BOTH PMN AND MONONUCLEAR     NO SQUAMOUS EPITHELIAL CELLS SEEN     NO ORGANISMS SEEN   Culture PENDING   Incomplete    Report Status PENDING   Incomplete   WOUND CULTURE     Status: Normal (Preliminary result)   Collection Time   02/08/12  3:20 PM      Component Value Range Status Comment   Specimen Description HIP RIGHT JOINT   Final    Special Requests NONE   Final    Gram Stain     Final    Value: NO WBC SEEN     NO SQUAMOUS EPITHELIAL CELLS SEEN     NO ORGANISMS SEEN   Culture PENDING   Incomplete    Report Status PENDING   Incomplete   ANAEROBIC CULTURE     Status: Normal (Preliminary result)   Collection Time   02/08/12  3:20 PM      Component Value Range Status Comment   Specimen Description HIP RIGHT JOINT   Final    Special Requests NONE   Final    Gram Stain     Final    Value: NO WBC SEEN     NO SQUAMOUS EPITHELIAL CELLS SEEN     NO ORGANISMS SEEN   Culture PENDING   Incomplete    Report Status PENDING   Incomplete    Studies/Results: No results found.  Medications:  Scheduled:   . ALPRAZolam  0.5 mg Oral TID  . buPROPion  300 mg Oral Daily  . dicyclomine  10 mg Oral TID AC & HS  . docusate sodium  100 mg Oral BID  . enoxaparin (LOVENOX) injection  30 mg Subcutaneous Q12H  . fesoterodine  4 mg Oral Daily  . FLUoxetine  60 mg Oral Daily  . gabapentin  300 mg Oral TID  . HYDROcodone-acetaminophen  1 tablet Oral Q6H  . OLANZapine  5 mg Oral QHS  . pantoprazole  40 mg Oral Daily  . senna  1 tablet Oral BID  . simvastatin  20 mg Oral q1800  . vancomycin  1,000 mg Intravenous Q12H  . [DISCONTINUED] doxycycline  100 mg Oral Q12H  . [DISCONTINUED] enoxaparin (LOVENOX) injection  40 mg Subcutaneous Q24H  . [DISCONTINUED] fentaNYL  25-50 mcg Intravenous Once   Continuous:   . sodium chloride 75 mL/hr at 02/08/12 1724  . [DISCONTINUED] lactated  ringers 1,000 mL (02/08/12 1143)   EXB:MWUX & mag hydroxide-simeth, bisacodyl, HYDROmorphone (DILAUDID) injection, loperamide, [EXPIRED] magnesium citrate, menthol-cetylpyridinium, metoCLOPramide (REGLAN) injection, metoCLOPramide, [COMPLETED] ondansetron (ZOFRAN) IV, ondansetron (ZOFRAN) IV, ondansetron, phenol, senna-docusate, [DISCONTINUED] bupivacaine-EPINEPHrine, [DISCONTINUED] diphenhydrAMINE, [DISCONTINUED] fentaNYL, [DISCONTINUED] fentaNYL [DISCONTINUED] HYDROcodone-acetaminophen, [DISCONTINUED] midazolam, [DISCONTINUED] midazolam, [DISCONTINUED] ondansetron, [DISCONTINUED] ondansetron (ZOFRAN) IV, [DISCONTINUED] sodium chloride irrigation, [DISCONTINUED] sodium chloride irrigation  Assesment: She had surgery. There is concern about infection so she's going to stay on IV  antibiotics. She will continue everything else Active Problems:  * No active hospital problems. *     Plan: No change in medicines    LOS: 3 days   Poppi Scantling L 02/09/2012, 10:25 AM

## 2012-02-10 LAB — TYPE AND SCREEN
ABO/RH(D): O POS
Antibody Screen: NEGATIVE
Unit division: 0

## 2012-02-10 LAB — CBC
Hemoglobin: 9.1 g/dL — ABNORMAL LOW (ref 12.0–15.0)
MCH: 26.2 pg (ref 26.0–34.0)
MCHC: 31.1 g/dL (ref 30.0–36.0)
MCV: 84.4 fL (ref 78.0–100.0)
RBC: 3.47 MIL/uL — ABNORMAL LOW (ref 3.87–5.11)

## 2012-02-10 NOTE — Anesthesia Postprocedure Evaluation (Signed)
  Anesthesia Post-op Note  Patient: [name]  Procedure(s) Performed: Procedure(s) (LRB) with comments: IRRIGATION AND DEBRIDEMENT HIP (Right)  Patient Location: Room 327  Anesthesia Type: Spinal  Level of Consciousness: awake, alert , oriented and patient cooperative  Airway and Oxygen Therapy: Patient Spontanous Breathing Room air  Post-op Pain: mild  Post-op Assessment: Post-op Vital signs reviewed, Patient's Cardiovascular Status Stable, Respiratory Function Stable, Patent Airway and No signs of Nausea or vomiting  Post-op Vital Signs: Reviewed and stable  Complications: No apparent anesthesia complications

## 2012-02-10 NOTE — Addendum Note (Signed)
Addendum  created 02/10/12 1850 by Marolyn Hammock, CRNA   Modules edited:Notes Section

## 2012-02-10 NOTE — Progress Notes (Signed)
Subjective: She says she feels okay. She has no complaints. She does have some pain in her hip  Objective: Vital signs in last 24 hours: Temp:  [98 F (36.7 C)-99.9 F (37.7 C)] 98.8 F (37.1 C) (11/17 0522) Pulse Rate:  [68-76] 74  (11/17 0522) Resp:  [16-18] 18  (11/17 0522) BP: (93-101)/(48-63) 93/48 mmHg (11/17 0522) SpO2:  [91 %-96 %] 96 % (11/17 0522) Weight change:  Last BM Date: 02/06/12  Intake/Output from previous day: 11/16 0701 - 11/17 0700 In: 2189.2 [P.O.:860; I.V.:869.2; IV Piggyback:400] Out: 195 [Urine:125; Drains:70]  PHYSICAL EXAM General appearance: alert, cooperative and no distress Resp: clear to auscultation bilaterally Cardio: regular rate and rhythm, S1, S2 normal, no murmur, click, rub or gallop GI: soft, non-tender; bowel sounds normal; no masses,  no organomegaly Extremities: Surgical wound on right hip  Lab Results:    Basic Metabolic Panel:  Basename 02/09/12 0529  NA 141  K 3.9  CL 105  CO2 28  GLUCOSE 98  BUN 11  CREATININE 0.72  CALCIUM 8.9  MG --  PHOS --   Liver Function Tests: No results found for this basename: AST:2,ALT:2,ALKPHOS:2,BILITOT:2,PROT:2,ALBUMIN:2 in the last 72 hours No results found for this basename: LIPASE:2,AMYLASE:2 in the last 72 hours No results found for this basename: AMMONIA:2 in the last 72 hours CBC:  Basename 02/10/12 0635 02/09/12 0529  WBC 6.3 6.1  NEUTROABS -- --  HGB 9.1* 8.6*  HCT 29.3* 28.7*  MCV 84.4 85.2  PLT 251 263   Cardiac Enzymes: No results found for this basename: CKTOTAL:3,CKMB:3,CKMBINDEX:3,TROPONINI:3 in the last 72 hours BNP: No results found for this basename: PROBNP:3 in the last 72 hours D-Dimer: No results found for this basename: DDIMER:2 in the last 72 hours CBG: No results found for this basename: GLUCAP:6 in the last 72 hours Hemoglobin A1C: No results found for this basename: HGBA1C in the last 72 hours Fasting Lipid Panel: No results found for this  basename: CHOL,HDL,LDLCALC,TRIG,CHOLHDL,LDLDIRECT in the last 72 hours Thyroid Function Tests: No results found for this basename: TSH,T4TOTAL,FREET4,T3FREE,THYROIDAB in the last 72 hours Anemia Panel: No results found for this basename: VITAMINB12,FOLATE,FERRITIN,TIBC,IRON,RETICCTPCT in the last 72 hours Coagulation: No results found for this basename: LABPROT:2,INR:2 in the last 72 hours Urine Drug Screen: Drugs of Abuse  No results found for this basename: labopia, cocainscrnur, labbenz, amphetmu, thcu, labbarb    Alcohol Level: No results found for this basename: ETH:2 in the last 72 hours Urinalysis: No results found for this basename: COLORURINE:2,APPERANCEUR:2,LABSPEC:2,PHURINE:2,GLUCOSEU:2,HGBUR:2,BILIRUBINUR:2,KETONESUR:2,PROTEINUR:2,UROBILINOGEN:2,NITRITE:2,LEUKOCYTESUR:2 in the last 72 hours Misc. Labs:  ABGS No results found for this basename: PHART,PCO2,PO2ART,TCO2,HCO3 in the last 72 hours CULTURES Recent Results (from the past 240 hour(s))  SURGICAL PCR SCREEN     Status: Normal   Collection Time   02/06/12 10:48 PM      Component Value Range Status Comment   MRSA, PCR NEGATIVE  NEGATIVE Final    Staphylococcus aureus NEGATIVE  NEGATIVE Final   WOUND CULTURE     Status: Normal (Preliminary result)   Collection Time   02/08/12  3:07 PM      Component Value Range Status Comment   Specimen Description HIP RIGHT SUPERFICIAL WOUND   Final    Special Requests NONE   Final    Gram Stain     Final    Value: FEW WBC PRESENT,BOTH PMN AND MONONUCLEAR     NO SQUAMOUS EPITHELIAL CELLS SEEN     NO ORGANISMS SEEN   Culture NO GROWTH 1 DAY  Final    Report Status PENDING   Incomplete   ANAEROBIC CULTURE     Status: Normal (Preliminary result)   Collection Time   02/08/12  3:07 PM      Component Value Range Status Comment   Specimen Description HIP RIGHT SUPERFICIAL WOUND   Final    Special Requests NONE   Final    Gram Stain     Final    Value: FEW WBC PRESENT,BOTH PMN  AND MONONUCLEAR     NO SQUAMOUS EPITHELIAL CELLS SEEN     NO ORGANISMS SEEN   Culture     Final    Value: NO ANAEROBES ISOLATED; CULTURE IN PROGRESS FOR 5 DAYS   Report Status PENDING   Incomplete   WOUND CULTURE     Status: Normal (Preliminary result)   Collection Time   02/08/12  3:20 PM      Component Value Range Status Comment   Specimen Description HIP RIGHT JOINT   Final    Special Requests NONE   Final    Gram Stain     Final    Value: NO WBC SEEN     NO SQUAMOUS EPITHELIAL CELLS SEEN     NO ORGANISMS SEEN   Culture NO GROWTH 1 DAY   Final    Report Status PENDING   Incomplete   ANAEROBIC CULTURE     Status: Normal (Preliminary result)   Collection Time   02/08/12  3:20 PM      Component Value Range Status Comment   Specimen Description HIP RIGHT JOINT   Final    Special Requests NONE   Final    Gram Stain     Final    Value: NO WBC SEEN     NO SQUAMOUS EPITHELIAL CELLS SEEN     NO ORGANISMS SEEN   Culture     Final    Value: NO ANAEROBES ISOLATED; CULTURE IN PROGRESS FOR 5 DAYS   Report Status PENDING   Incomplete    Studies/Results: No results found.  Medications:  Prior to Admission:  Prescriptions prior to admission  Medication Sig Dispense Refill  . ALPRAZolam (XANAX) 0.5 MG tablet Take 1 tablet (0.5 mg total) by mouth 3 (three) times daily.  30 tablet  2  . buPROPion (WELLBUTRIN XL) 150 MG 24 hr tablet Take 300 mg by mouth every morning.      . ciprofloxacin (CIPRO) 400 MG/200ML SOLN Inject 200 mLs (400 mg total) into the vein every 12 (twelve) hours.  2000 mL  14  . dicyclomine (BENTYL) 10 MG capsule Take 1 capsule (10 mg total) by mouth 4 (four) times daily -  before meals and at bedtime.  40 capsule  0  . fesoterodine (TOVIAZ) 4 MG TB24 Take 1 tablet (4 mg total) by mouth daily.  30 tablet  0  . FLUoxetine (PROZAC) 20 MG capsule Take 60 mg by mouth daily.      Marland Kitchen gabapentin (NEURONTIN) 300 MG capsule Take 1 capsule (300 mg total) by mouth 3 (three) times  daily.  90 capsule  5  . loperamide (IMODIUM) 2 MG capsule Take 2-4 mg by mouth 4 (four) times daily as needed. For diarrhea, take 2 tablets (4mg ) initially then take 2 mg after each loose stool      . NORCO 5-325 MG per tablet TAKE (1) TABLET BY MOUTH EVERY FOUR HOURS AS NEEDED FOR PAIN.  60 each  5  . OLANZapine (ZYPREXA) 5 MG tablet Take  5 mg by mouth at bedtime.      Marland Kitchen omeprazole (PRILOSEC) 20 MG capsule Take 20 mg by mouth daily.      . PredniSONE 10 MG KIT Take 1 kit (10 mg total) by mouth as directed.  1 kit  0  . simvastatin (ZOCOR) 20 MG tablet Take 20 mg by mouth every evening.      . vancomycin (VANCOCIN) 1 GM/200ML SOLN Inject 200 mLs (1,000 mg total) into the vein every 12 (twelve) hours.  4000 mL  10   Scheduled:   . ALPRAZolam  0.5 mg Oral TID  . buPROPion  300 mg Oral Daily  . dicyclomine  10 mg Oral TID AC & HS  . docusate sodium  100 mg Oral BID  . enoxaparin (LOVENOX) injection  30 mg Subcutaneous Q12H  . fesoterodine  4 mg Oral Daily  . FLUoxetine  60 mg Oral Daily  . gabapentin  300 mg Oral TID  . HYDROcodone-acetaminophen  1 tablet Oral Q6H  . OLANZapine  5 mg Oral QHS  . pantoprazole  40 mg Oral Daily  . senna  1 tablet Oral BID  . simvastatin  20 mg Oral q1800  . [COMPLETED] vancomycin  1,000 mg Intravenous Q12H   Continuous:   . sodium chloride 20 mL/hr at 02/10/12 0519   JXB:JYNW & mag hydroxide-simeth, bisacodyl, HYDROmorphone (DILAUDID) injection, loperamide, menthol-cetylpyridinium, metoCLOPramide (REGLAN) injection, metoCLOPramide, ondansetron (ZOFRAN) IV, ondansetron, phenol, senna-docusate  Assesment: She has problems with her right hip. She had irrigation and debridement of her hip and we're awaiting culture results. They are negative at 24 hours. She is apparently going to need further treatment depending on how she does as far as her ability to walk Active Problems:  * No active hospital problems. *     Plan: Continue IV antibiotics PT  etc.    LOS: 4 days   Encarnacion Bole L 02/10/2012, 8:58 AM

## 2012-02-10 NOTE — Progress Notes (Signed)
Subjective: 2 Days Post-Op Procedure(s) (LRB): IRRIGATION AND DEBRIDEMENT HIP (Right)   Objective: Vital signs in last 24 hours: Temp:  [98 F (36.7 C)-99.9 F (37.7 C)] 98.8 F (37.1 C) (11/17 0522) Pulse Rate:  [68-76] 74  (11/17 0522) Resp:  [16-18] 18  (11/17 0522) BP: (93-101)/(48-63) 93/48 mmHg (11/17 0522) SpO2:  [91 %-96 %] 96 % (11/17 0522)  Intake/Output from previous day: 11/16 0701 - 11/17 0700 In: 2189.2 [P.O.:860; I.V.:869.2; IV Piggyback:400] Out: 195 [Urine:125; Drains:70] Intake/Output this shift:     [facility] 02/10/12 0635 02/09/12 0529  HGB 9.1* 8.6*    Basename 02/10/12 0635 02/09/12 0529  WBC 6.3 6.1  RBC 3.47* 3.37*  HCT 29.3* 28.7*  PLT 251 263    Basename 02/09/12 0529  NA 141  K 3.9  CL 105  CO2 28  BUN 11  CREATININE 0.72  GLUCOSE 98  CALCIUM 8.9   No results found for this basename: LABPT:2,INR:2 in the last 72 hours    Assessment/Plan: 2 Days Post-Op Procedure(s) (LRB): IRRIGATION AND DEBRIDEMENT HIP (Right) Cultures are negative so far  [employer] 02/10/2012, 9:30 AM

## 2012-02-11 ENCOUNTER — Telehealth: Payer: Self-pay | Admitting: Orthopedic Surgery

## 2012-02-11 LAB — WOUND CULTURE
Culture: NO GROWTH
Culture: NO GROWTH
Gram Stain: NONE SEEN

## 2012-02-11 LAB — CBC
HCT: 29.5 % — ABNORMAL LOW (ref 36.0–46.0)
MCHC: 30.2 g/dL (ref 30.0–36.0)
MCV: 85.5 fL (ref 78.0–100.0)
Platelets: 245 10*3/uL (ref 150–400)
RDW: 17.3 % — ABNORMAL HIGH (ref 11.5–15.5)
WBC: 5.8 10*3/uL (ref 4.0–10.5)

## 2012-02-11 MED ORDER — SODIUM CHLORIDE 0.9 % IJ SOLN: 10.0000 mL | INTRAMUSCULAR | Status: DC | PRN

## 2012-02-11 MED ORDER — SODIUM CHLORIDE 0.9 % IJ SOLN
10.0000 mL | Freq: Two times a day (BID) | INTRAMUSCULAR | Status: DC
  Administered 2012-02-11: 20 mL
  Administered 2012-02-11: 10 mL
  Filled 2012-02-11: qty 6

## 2012-02-11 NOTE — Telephone Encounter (Signed)
Message copied by Doristine Section on Mon Feb 11, 2012 11:09 AM ------      Message from: Fuller Canada E      Created: Mon Feb 11, 2012 10:24 AM       Is there a consult charge ??      ----- Message -----         From: Doristine Section         Sent: 02/11/2012   8:53 AM           To: Vickki Hearing, MD            Dr. Romeo Apple,       Ms. Danese's surgery is entered (CPT 27030) for DOS 02/08/12, however, the system has no fee attached.  I will be sending an email request for them to add the fee, so nothing further to enter at this time.      Thanks, Okey Regal      ----- Message -----         From: Vickki Hearing, MD         Sent: 02/08/2012   3:54 PM           To: Margit Banda, Tedra [place] check to see if consult charge has been posted

## 2012-02-11 NOTE — Progress Notes (Signed)
UR Chart Review Completed  

## 2012-02-11 NOTE — Progress Notes (Signed)
Physical Therapy Treatment Patient Details Name: [name] MRN: [id number] DOB: 29-Sep-1945 Today's Date: 02/11/2012 Time: 1191-4782 PT Time Calculation (min): 45 min 1 therex 1 gt  PT Assessment / Plan / Recommendation Comments on Treatment Session    Patient was very fearful and focused on not being sent back to [facility] and not having a place to live after leaving hospital. Patient was very emotional and eager to speak with Social worker, but agreed to work with therapy until they arrived. Patient was able to complete 20' of gait training with RW;Min A. Social work arrived and assured patient she would not be retuning to [employer] and patient was satisfied.                      Follow Up Recommendations        Does the patient have the potential to tolerate intense rehabilitation     Barriers to Discharge        Equipment Recommendations       Recommendations for Other Services    Frequency     Plan      Precautions / Restrictions     Pertinent Vitals/Pain     Mobility  Bed Mobility Supine to Sit: 6: Modified independent (Device/Increase time) Sitting - Scoot to Edge of Bed: 6: Modified independent (Device/Increase time) Sit to Supine: 6: Modified independent (Device/Increase time) Scooting to [facility] 5: Set up Transfers Sit to Stand: 4: Min assist Stand to Sit: 5: Supervision Ambulation/Gait Ambulation/Gait Assistance: 4: Min assist Ambulation Distance (Feet): 45 Feet Assistive device: Rolling walker Gait Pattern: Step-to pattern;Decreased stance time - right;Antalgic;Decreased step length - left Gait velocity: decreased    Exercises General Exercises - Lower Extremity Ankle Circles/Pumps: Both;10 reps Quad Sets: 10 reps;Both Short Arc Quad: Both;10 reps [employer]: 10 reps;Both Heel Slides: Both;10 reps Hip ABduction/ADduction: 10 reps;Both Other Exercises Other Exercises: sit<>stand x3 for strengthening and endurance   PT Diagnosis:    PT Problem  List:   PT Treatment Interventions:     PT Goals    Visit Information  Last PT Received On: 02/11/12    Subjective Data      Cognition       Balance     End of Session PT - End of Session Equipment Utilized During Treatment: Gait belt Activity Tolerance: Treatment limited secondary to agitation Patient left: in bed;with call bell/phone within reach;with bed alarm set;with family/visitor present   GP     [name] 02/11/2012, 2:16 PM

## 2012-02-11 NOTE — Clinical Documentation Improvement (Signed)
Anemia Blood Loss Clarification  THIS DOCUMENT IS NOT A PERMANENT PART OF THE MEDICAL RECORD  RESPOND TO THE THIS QUERY, FOLLOW THE INSTRUCTIONS BELOW:  1. If needed, update documentation for the patient's encounter via the notes activity.  2. Access this query again and click edit on the In [employer].  3. After updating, or not, click F2 to complete all highlighted (required) fields concerning your review. Select "additional documentation in the medical record" OR "no additional documentation provided".  4. Click Sign note button.  5. The deficiency will fall out of your In Basket *Please let us know if you are not able to complete this workflow by phone or e-mail (listed below).        02/11/12  Dear Dr. Felecia Shelling Marton Redwood  In an effort to better capture your patient's severity of illness, reflect appropriate length of stay and utilization of resources, a review of the patient medical record has revealed the following indicators.  Based on your clinical judgment, please clarify and document in a progress note and/or discharge summary the clinical condition associated with the following supporting information.  In responding to this query please exercise your independent judgment.  The fact that a query is asked, does not imply that any particular answer is desired or expected.  Please clarify anemia  Possible Clinical Conditions?   " Expected Acute Blood Loss Anemia " Acute Blood Loss Anemia " Acute on chronic blood loss anemia " Chronic blood loss anemia " Precipitous drop in Hematocrit " Other Condition________________ " Cannot Clinically Determine  Clinical Information:   Risk Factors:  S/P Irrigation & Debridement of Right hip EBL 100  Diagnostics: H&H  11/13=10.0/32.3 11/14= 9.9/32.5 11/16=8.6/28.7 11/17=9.1/29.3 11/18=8.9/29.5  Treatments: Transfusion: 1 unit PRBC -11/16 H&H monitoring  Reviewed: No response from MD  Thank You,  Harless Litten RN,  MSN Clinical Documentation Specialist: Office# [phone] [facility] Information Management [place]

## 2012-02-11 NOTE — Evaluation (Signed)
Occupational Therapy Evaluation Patient Details Name: [name] MRN: [id number] DOB: 01/27/1946 Today's Date: 02/11/2012 Time: 7829-5621 OT Time Calculation (min): 20 min  OT Assessment / Plan / Recommendation Clinical Impression  Patient is a 66 y/o female s/p cellulitis of Right hip presenting to acute OT wth assess of ADL performance. Patient was agitated with therapy stating that she did not life the nursing home where she came from and that they were mean to her. Pt's friend stated that patient has been in and out of nursing homes and hospitals for 7 to 8 years with problems with her hip. Patient appears to be functioning at baseline for ADL performance. Recommend pt discharge to SNF to continue therapy.; preferably at a different SNF.  All OT needs can be met at next venue of care; OT will sign off.    OT Assessment  All further OT needs can be met in the next venue of care    Follow Up Recommendations  SNF       Equipment Recommendations  None recommended by OT          Precautions / Restrictions Precautions Precautions: Anterior Hip;Fall Restrictions Weight Bearing Restrictions: Yes RLE Weight Bearing: Weight bearing as tolerated   Pertinent Vitals/Pain 10/10 pain level in right hip. RN made aware.    ADL  Lower Body Dressing: Performed;Moderate assistance Where Assessed - Lower Body Dressing: Supported sit to stand Transfers/Ambulation Related to ADLs: patient refused to get out of bed.        Visit Information  Last OT Received On: 02/11/12 Assistance Needed: +1    Subjective Data  Subjective: "I don't want to go back to the nursing home." Patient Stated Goal: To go home.   Prior Functioning     Home Living Lives With:  [facility] nursing home) Available Help at Discharge: Skilled Nursing Facility Type of Home: Skilled Nursing Facility Home Access: Level entry Home Layout: One level Bathroom Shower/Tub: Heritage manager Toilet:  Handicapped height Home Adaptive Equipment: Walker - rolling;Wheelchair - manual Additional Comments: patietn was walking with assistance at nursing facility where she resides.  Prior Function Level of Independence: Needs assistance Needs Assistance: Bathing;Dressing;Gait;Transfers Bath: Moderate Dressing: Moderate Meal Prep: Total Light Housekeeping: Total Gait Assistance: Supervision Able to Take Stairs?: No Driving: No Vocation: Retired Musician: No difficulties Dominant Hand: Right            Cognition  Overall Cognitive Status: History of cognitive impairments - at baseline Arousal/Alertness: Awake/alert Orientation Level: Appears intact for tasks assessed Behavior During Session: Agitated    Extremity/Trunk Assessment Right Upper Extremity Assessment RUE ROM/Strength/Tone: Within functional levels (MMT: 5/5) RUE Sensation: WFL - Light Touch;WFL - Proprioception RUE Coordination: WFL - gross/fine motor Left Upper Extremity Assessment LUE ROM/Strength/Tone: Within functional levels (MMT: 5/5) LUE Sensation: WFL - Light Touch;WFL - Proprioception LUE Coordination: WFL - gross/fine motor                 End of Session OT - End of Session Activity Tolerance: Treatment limited secondary to agitation Patient left: in bed;with call bell/phone within reach;with family/visitor present Nurse Communication: Patient requests pain meds    Limmie Patricia, OTR/L 02/11/2012, 2:36 PM

## 2012-02-11 NOTE — Progress Notes (Signed)
Subjective: She says she feels better. She says she think she's supposed to go home today  Objective: Vital signs in last 24 hours: Temp:  [98.4 F (36.9 C)-98.7 F (37.1 C)] 98.4 F (36.9 C) (11/18 0501) Pulse Rate:  [74-76] 74  (11/18 0501) Resp:  [18] 18  (11/18 0501) BP: (94-97)/(50-63) 97/63 mmHg (11/18 0501) SpO2:  [95 %-98 %] 98 % (11/18 0501) Weight change:  Last BM Date: 02/06/12  Intake/Output from previous day: 11/17 0701 - 11/18 0700 In: 680 [P.O.:680] Out: 120 [Drains:120]  PHYSICAL EXAM General appearance: alert, cooperative and mild distress Resp: clear to auscultation bilaterally Cardio: regular rate and rhythm, S1, S2 normal, no murmur, click, rub or gallop GI: soft, non-tender; bowel sounds normal; no masses,  no organomegaly Extremities: extremities normal, atraumatic, no cyanosis or edema  Lab Results:    Basic Metabolic Panel:  Basename 02/09/12 0529  NA 141  K 3.9  CL 105  CO2 28  GLUCOSE 98  BUN 11  CREATININE 0.72  CALCIUM 8.9  MG --  PHOS --   Liver Function Tests: No results found for this basename: AST:2,ALT:2,ALKPHOS:2,BILITOT:2,PROT:2,ALBUMIN:2 in the last 72 hours No results found for this basename: LIPASE:2,AMYLASE:2 in the last 72 hours No results found for this basename: AMMONIA:2 in the last 72 hours CBC:  Basename 02/11/12 0508 02/10/12 0635  WBC 5.8 6.3  NEUTROABS -- --  HGB 8.9* 9.1*  HCT 29.5* 29.3*  MCV 85.5 84.4  PLT 245 251   Cardiac Enzymes: No results found for this basename: CKTOTAL:3,CKMB:3,CKMBINDEX:3,TROPONINI:3 in the last 72 hours BNP: No results found for this basename: PROBNP:3 in the last 72 hours D-Dimer: No results found for this basename: DDIMER:2 in the last 72 hours CBG: No results found for this basename: GLUCAP:6 in the last 72 hours Hemoglobin A1C: No results found for this basename: HGBA1C in the last 72 hours Fasting Lipid Panel: No results found for this basename:  CHOL,HDL,LDLCALC,TRIG,CHOLHDL,LDLDIRECT in the last 72 hours Thyroid Function Tests: No results found for this basename: TSH,T4TOTAL,FREET4,T3FREE,THYROIDAB in the last 72 hours Anemia Panel: No results found for this basename: VITAMINB12,FOLATE,FERRITIN,TIBC,IRON,RETICCTPCT in the last 72 hours Coagulation: No results found for this basename: LABPROT:2,INR:2 in the last 72 hours Urine Drug Screen: Drugs of Abuse  No results found for this basename: labopia, cocainscrnur, labbenz, amphetmu, thcu, labbarb    Alcohol Level: No results found for this basename: ETH:2 in the last 72 hours Urinalysis: No results found for this basename: COLORURINE:2,APPERANCEUR:2,LABSPEC:2,PHURINE:2,GLUCOSEU:2,HGBUR:2,BILIRUBINUR:2,KETONESUR:2,PROTEINUR:2,UROBILINOGEN:2,NITRITE:2,LEUKOCYTESUR:2 in the last 72 hours Misc. Labs:  ABGS No results found for this basename: PHART,PCO2,PO2ART,TCO2,HCO3 in the last 72 hours CULTURES Recent Results (from the past 240 hour(s))  SURGICAL PCR SCREEN     Status: Normal   Collection Time   02/06/12 10:48 PM      Component Value Range Status Comment   MRSA, PCR NEGATIVE  NEGATIVE Final    Staphylococcus aureus NEGATIVE  NEGATIVE Final   WOUND CULTURE     Status: Normal   Collection Time   02/08/12  3:07 PM      Component Value Range Status Comment   Specimen Description HIP RIGHT SUPERFICIAL WOUND   Final    Special Requests NONE   Final    Gram Stain     Final    Value: FEW WBC PRESENT,BOTH PMN AND MONONUCLEAR     NO SQUAMOUS EPITHELIAL CELLS SEEN     NO ORGANISMS SEEN   Culture NO GROWTH 2 DAYS   Final  Report Status 02/11/2012 FINAL   Final   ANAEROBIC CULTURE     Status: Normal (Preliminary result)   Collection Time   02/08/12  3:07 PM      Component Value Range Status Comment   Specimen Description HIP RIGHT SUPERFICIAL WOUND   Final    Special Requests NONE   Final    Gram Stain     Final    Value: FEW WBC PRESENT,BOTH PMN AND MONONUCLEAR     NO  SQUAMOUS EPITHELIAL CELLS SEEN     NO ORGANISMS SEEN   Culture     Final    Value: NO ANAEROBES ISOLATED; CULTURE IN PROGRESS FOR 5 DAYS   Report Status PENDING   Incomplete   WOUND CULTURE     Status: Normal   Collection Time   02/08/12  3:20 PM      Component Value Range Status Comment   Specimen Description HIP RIGHT JOINT   Final    Special Requests NONE   Final    Gram Stain     Final    Value: NO WBC SEEN     NO SQUAMOUS EPITHELIAL CELLS SEEN     NO ORGANISMS SEEN   Culture NO GROWTH 2 DAYS   Final    Report Status 02/11/2012 FINAL   Final   ANAEROBIC CULTURE     Status: Normal (Preliminary result)   Collection Time   02/08/12  3:20 PM      Component Value Range Status Comment   Specimen Description HIP RIGHT JOINT   Final    Special Requests NONE   Final    Gram Stain     Final    Value: NO WBC SEEN     NO SQUAMOUS EPITHELIAL CELLS SEEN     NO ORGANISMS SEEN   Culture     Final    Value: NO ANAEROBES ISOLATED; CULTURE IN PROGRESS FOR 5 DAYS   Report Status PENDING   Incomplete    Studies/Results: No results found.  Medications:  Scheduled:   . ALPRAZolam  0.5 mg Oral TID  . buPROPion  300 mg Oral Daily  . dicyclomine  10 mg Oral TID AC & HS  . docusate sodium  100 mg Oral BID  . enoxaparin (LOVENOX) injection  30 mg Subcutaneous Q12H  . fesoterodine  4 mg Oral Daily  . FLUoxetine  60 mg Oral Daily  . gabapentin  300 mg Oral TID  . HYDROcodone-acetaminophen  1 tablet Oral Q6H  . OLANZapine  5 mg Oral QHS  . pantoprazole  40 mg Oral Daily  . senna  1 tablet Oral BID  . simvastatin  20 mg Oral q1800   Continuous:   . [DISCONTINUED] sodium chloride 20 mL/hr at 02/10/12 0519   NWG:NFAO & mag hydroxide-simeth, bisacodyl, HYDROmorphone (DILAUDID) injection, loperamide, menthol-cetylpyridinium, metoCLOPramide (REGLAN) injection, metoCLOPramide, ondansetron (ZOFRAN) IV, ondansetron, phenol, senna-docusate  Assesment: She had surgery on her hip with debridement.  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    LOS: 5 days   Exzavier Ruderman L 02/11/2012, 8:53 AM

## 2012-02-11 NOTE — Progress Notes (Signed)
Subjective: 3 Days Post-Op Procedure(s) (LRB): IRRIGATION AND DEBRIDEMENT HIP (Right) Patient reports pain as mild.    Objective: Vital signs in last 24 hours: Temp:  [98.4 F (36.9 C)-98.7 F (37.1 C)] 98.4 F (36.9 C) (11/18 0501) Pulse Rate:  [74-76] 74  (11/18 0501) Resp:  [18] 18  (11/18 0501) BP: (94-97)/(50-63) 97/63 mmHg (11/18 0501) SpO2:  [95 %-98 %] 98 % (11/18 0501)  Intake/Output from previous day: 11/17 0701 - 11/18 0700 In: 680 [P.O.:680] Out: 120 [Drains:120] Intake/Output this shift: Total I/O In: 20 [I.V.:20] Out: -    Basename 02/11/12 0508 02/10/12 0635 02/09/12 0529  HGB 8.9* 9.1* 8.6*    Basename 02/11/12 0508 02/10/12 0635  WBC 5.8 6.3  RBC 3.45* 3.47*  HCT 29.5* 29.3*  PLT 245 251    Basename 02/09/12 0529  NA 141  K 3.9  CL 105  CO2 28  BUN 11  CREATININE 0.72  GLUCOSE 98  CALCIUM 8.9   No results found for this basename: LABPT:2,INR:2 in the last 72 hours  Neurologically intact ABD soft Neurovascular intact Sensation intact distally Intact pulses distally Dorsiflexion/Plantar flexion intact Incision: scant drainage  Assessment/Plan: 3 Days Post-Op Procedure(s) (LRB): IRRIGATION AND DEBRIDEMENT HIP (Right) Discharge to SNF, tomorrow  Fuller Canada 02/11/2012, 12:17 PM

## 2012-02-11 NOTE — Telephone Encounter (Signed)
No Note

## 2012-02-12 ENCOUNTER — Encounter (HOSPITAL_COMMUNITY): Payer: Self-pay | Admitting: Orthopedic Surgery

## 2012-02-12 MED ORDER — HYDROCODONE-ACETAMINOPHEN 5-325 MG PO TABS: 1.0000 | ORAL_TABLET | ORAL | Status: DC | PRN

## 2012-02-12 NOTE — Clinical Social Work Placement (Signed)
Clinical Social Work Department CLINICAL SOCIAL WORK PLACEMENT NOTE 02/12/2012  Patient:  [name], [name]  Account Number:  [id number] Admit date:  02/06/2012  Clinical Social Worker:  Derenda Fennel, LCSW  Date/time:  02/08/2012 04:15 PM  Clinical Social Work is seeking post-discharge placement for this patient at the following level of care:   SKILLED NURSING   (*CSW will update this form in Epic as items are completed)   02/08/2012  Patient/family provided with [facility] Department of Clinical Social Work's list of facilities offering this level of care within the geographic area requested by the patient (or if unable, by the patient's family).  02/08/2012  Patient/family informed of their freedom to choose among providers that offer the needed level of care, that participate in Medicare, Medicaid or managed care program needed by the patient, have an available bed and are willing to accept the patient.  02/08/2012  Patient/family informed of [facility]' ownership interest in [facility], as well as of the fact that they are under no obligation to receive care at this facility.  PASARR submitted to [employer] on  PASARR number received from [employer]DS on   FL2 transmitted to all facilities in geographic area requested by pt/family on  02/08/2012 FL2 transmitted to all facilities within larger geographic area on   Patient informed that his/her managed care company has contracts with or will negotiate with  certain facilities, including the following:     Patient/family informed of bed offers received:  02/12/2012 Patient chooses bed at [facility] [place] Physician recommends and patient chooses bed at  [facility] [place]  Patient to be transferred to [facility] [place] on  02/12/2012 Patient to be transferred to facility by [facility]  The following physician request were entered in Epic:   Additional Comments: existing pasarr.  Derenda Fennel, [place] [phone]

## 2012-02-12 NOTE — Discharge Summary (Signed)
Physician Discharge Summary  Patient ID: [name] MRN: [id number] DOB/AGE: 08-15-1945 66 y.o.  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 Patient recently developed cellulitis and wound on the rt hip area. 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 She had wound care and IV antibiotics. Deep wound culture showed multidrug resist bacteria. Her condition was discussed with her orthopedist with DR. Romeo Apple and was transferred her for wound debridement or removal the prosthesis. No fever, chills, cough, nausea or vomiting. No cough, shortness of breath or chest pain.  The patient underwent surgical debridement of the right hip and wound exploration was found to have no evidence of infection with culture negative anaerobic and aerobic cultures including superficial and deep cultures. The patient does have orthopedic implant malfunction with proximal migration of the right femoral stem/head and articulation with the pelvis. There is no invagination into the abdomen. The prosthesis went superiorly.    Consults: None  Significant Diagnostic Studies: Hemoglobin is 8.9 cultures are negative Treatments: surgery: Surgical wound exploration and debridement.  Discharge Exam: Blood pressure 96/58, pulse 81, temperature 97.5 F (36.4 C), temperature source Oral, resp. rate 18, height 5\' 7"  (1.702 m), weight 127 lb 13.9 oz (58 kg), SpO2 91.00%. General appearance: alert and cooperative Incision/Wound: scant drainage otherwise clean right hip  wound  Disposition: 51-Hospice/Medical Facility  Discharge Orders    Future Orders Please Complete By Expires   Diet - low sodium heart healthy      Call MD / Call 911      Comments:   If you experience chest pain or shortness of breath, CALL 911 and be transported to the hospital emergency room.  If you develope a fever above 101 F, pus (white drainage) or increased drainage or redness at the wound, or calf pain, call your surgeon's office.   Constipation Prevention      Comments:   Drink plenty of fluids.  Prune juice may be helpful.  You may use a stool softener, such as Colace (over the counter) 100 mg twice a day.  Use MiraLax (over the counter) for constipation as needed.   Increase activity slowly as tolerated      Discharge instructions      Comments:   Ambulation bed to chair, stand and pivot and as needed okay to weight-bear as tolerated on the right leg  Remove staples in 10 days  Dressing as needed       Medication List     As of 02/12/2012  7:18 AM    STOP taking these medications         ciprofloxacin 400 MG/200ML Soln   Commonly known as: CIPRO      PredniSONE 10 MG Kit      vancomycin 1 GM/200ML Soln   Commonly known as: VANCOCIN      TAKE these medications         ALPRAZolam 0.5 MG tablet   Commonly known as: XANAX   Take 1 tablet (0.5 mg total) by mouth 3 (three) times daily.      buPROPion 150 MG 24  hr tablet   Commonly known as: WELLBUTRIN XL   Take 300 mg by mouth every morning.      dicyclomine 10 MG capsule   Commonly known as: BENTYL   Take 1 capsule (10 mg total) by mouth 4 (four) times daily -  before meals and at bedtime.      fesoterodine 4 MG Tb24   Commonly known as: TOVIAZ   Take 1 tablet (4 mg total) by mouth daily.      FLUoxetine 20 MG capsule   Commonly known as: PROZAC   Take 60 mg by mouth daily.      gabapentin 300 MG capsule   Commonly known as: NEURONTIN   Take 1 capsule (300 mg total) by mouth 3 (three) times  daily.      HYDROcodone-acetaminophen 5-325 MG per tablet   Commonly known as: NORCO/VICODIN   Take 1 tablet by mouth every 4 (four) hours as needed for pain.      loperamide 2 MG capsule   Commonly known as: IMODIUM   Take 2-4 mg by mouth 4 (four) times daily as needed. For diarrhea, take 2 tablets (4mg ) initially then take 2 mg after each loose stool      OLANZapine 5 MG tablet   Commonly known as: ZYPREXA   Take 5 mg by mouth at bedtime.      omeprazole 20 MG capsule   Commonly known as: PRILOSEC   Take 20 mg by mouth daily.      simvastatin 20 MG tablet   Commonly known as: ZOCOR   Take 20 mg by mouth every evening.         Signed: Fuller Canada 02/12/2012, 7:18 AM

## 2012-02-12 NOTE — Clinical Social Work Note (Addendum)
CSW spoke with pt's guardian [name] regarding bed offers and she accepts bed at [facility]. Pt also aware and agreeable. [facility] notified. Pt to transfer via [facility]. D/C summary faxed. [facility] aware of need for air mattress.  Derenda Fennel, [place] [phone]

## 2012-02-12 NOTE — Discharge Summary (Signed)
In addition to the information on Dr. Mort Sawyers discharge summary she needs an air mattress

## 2012-02-13 ENCOUNTER — Encounter: Payer: Self-pay | Admitting: Orthopedic Surgery

## 2012-02-13 LAB — ANAEROBIC CULTURE

## 2012-02-13 NOTE — Progress Notes (Signed)
Patient ID: [name], female   DOB: 03/03/1946, 65 y.o.   MRN: [id number] Non excisional

## 2012-02-25 ENCOUNTER — Ambulatory Visit: Payer: Medicare Other | Admitting: Orthopedic Surgery

## 2012-02-28 ENCOUNTER — Ambulatory Visit (INDEPENDENT_AMBULATORY_CARE_PROVIDER_SITE_OTHER): Payer: Medicare Other | Admitting: Orthopedic Surgery

## 2012-02-28 ENCOUNTER — Encounter: Payer: Self-pay | Admitting: Orthopedic Surgery

## 2012-02-28 DIAGNOSIS — L03115 Cellulitis of right lower limb: Secondary | ICD-10-CM

## 2012-02-28 DIAGNOSIS — L89324 Pressure ulcer of left buttock, stage 4: Secondary | ICD-10-CM

## 2012-02-28 DIAGNOSIS — L02419 Cutaneous abscess of limb, unspecified: Secondary | ICD-10-CM

## 2012-02-28 DIAGNOSIS — T84498A Other mechanical complication of other internal orthopedic devices, implants and grafts, initial encounter: Secondary | ICD-10-CM | POA: Insufficient documentation

## 2012-02-28 DIAGNOSIS — L8994 Pressure ulcer of unspecified site, stage 4: Secondary | ICD-10-CM

## 2012-02-28 DIAGNOSIS — Z96649 Presence of unspecified artificial hip joint: Secondary | ICD-10-CM | POA: Insufficient documentation

## 2012-02-28 DIAGNOSIS — L89309 Pressure ulcer of unspecified buttock, unspecified stage: Secondary | ICD-10-CM

## 2012-02-28 NOTE — Progress Notes (Signed)
Patient ID: [name], female   DOB: 12/31/1945, 66 y.o.   MRN: [id number] Chief Complaint  Patient presents with  . Follow-up    Hospital follow up of right hip.    1. Decubitus ulcer of left buttock, stage 4   2. Cellulitis of right hip   3. S/P hip replacement    Bipolar hip replacement right   4. Mechanical complication of internal orthopedic implant     The patient was admitted to the hospital in November 13 for what was a small area of drainage and open wound over the right hip from previous bipolar as prosthetic replacement. She also has a left decubitus ulcer on the left buttock.  She had cultures and they were negative. However her orthopedic implant has migrated into the pelvis and will require reconstruction if we can get wound to clarify.  Today the wound is still erythematous although it is closed. She's had multiple days of IV vancomycin. She has currently been on doxycycline.  She complains of radicular pain in her right leg and it is unclear if this is coming from degenerative disc disease or from the proximal migration of the prosthesis.  In any event she is very concave a patient with poor nutrition.  I think she will need to have continued care the left hip pressure ulcer and should see appropriate specialist for that  For the right hip I would restart IV vancomycin thousand milligrams a day for 14 days  4 the right leg radiculopathy I have treated her with Neurontin as well as Norco she has not improved I would see a neurosurgeon in consultation regarding that  For the prosthetic malfunction I would like to get her seen by a revision specialist greater revision of the implant removal of the implant.  I will follow her again in 4 weeks to reevaluate the right hip incision

## 2012-02-28 NOTE — Patient Instructions (Signed)
See progress note.

## 2012-03-03 ENCOUNTER — Other Ambulatory Visit (HOSPITAL_COMMUNITY): Payer: Self-pay | Admitting: Internal Medicine

## 2012-03-03 ENCOUNTER — Ambulatory Visit (HOSPITAL_COMMUNITY)
Admission: RE | Admit: 2012-03-03 | Discharge: 2012-03-03 | Disposition: A | Discharge status: Home / Self Care | Payer: Medicare Other | Source: Ambulatory Visit | Attending: Internal Medicine | Admitting: Internal Medicine

## 2012-03-03 DIAGNOSIS — L8990 Pressure ulcer of unspecified site, unspecified stage: Secondary | ICD-10-CM

## 2012-03-03 NOTE — Procedures (Signed)
R PICC No complication No blood loss. See complete dictation in [facility].

## 2012-03-12 ENCOUNTER — Telehealth: Payer: Self-pay | Admitting: Orthopedic Surgery

## 2012-03-12 NOTE — Telephone Encounter (Signed)
Advised Caswell Corwin of doctor's reply

## 2012-03-12 NOTE — Telephone Encounter (Signed)
See her jan 2

## 2012-03-12 NOTE — Telephone Encounter (Signed)
[name] called about [name], said [name] will finish the Vancomycin tomorrow and is not any better.  She is still is severe pain, and the site is still red as it was when she was here for last appointment.  She does not have any fever.  She is getting the Vicodin 5/325 ,2 tablets every 4 hours around the clock.  Her next appointment here is 03/27/12. Harriett Sine asked if you need to see her sooner or what do you advise?  Harriett Sine can be reached at [facility] # [phone]

## 2012-03-13 ENCOUNTER — Encounter: Payer: Self-pay | Admitting: Orthopedic Surgery

## 2012-03-13 NOTE — Progress Notes (Unsigned)
Patient ID: [name], female   DOB: [birth year], 66 y.o.   MRN: [id number] Called upon today to alert them that I have placed a consultation request for this patient with [facility] for possible revision hip surgery  The patient has a prosthesis which is migrated proximally and will require revision, on the most recent surgery for infection it was noted that the tissue was severely scarred thickened And I could not even get to the prosthesis  Although cultures were negative I suspect that the area is infected.  She should be kept nonweightbearing until a consultation can be arranged for possible revision  I will have this letter forwarded to [facility] to be placed in her chart she has a followup visit scheduled with [place] on January 2

## 2012-03-27 ENCOUNTER — Ambulatory Visit (INDEPENDENT_AMBULATORY_CARE_PROVIDER_SITE_OTHER): Payer: Medicare Other | Admitting: Orthopedic Surgery

## 2012-03-27 ENCOUNTER — Encounter: Payer: Self-pay | Admitting: Orthopedic Surgery

## 2012-03-27 ENCOUNTER — Telehealth: Payer: Self-pay | Admitting: *Deleted

## 2012-03-27 VITALS — BP 102/56 | Ht 67.0 in

## 2012-03-27 DIAGNOSIS — T84498A Other mechanical complication of other internal orthopedic devices, implants and grafts, initial encounter: Secondary | ICD-10-CM

## 2012-03-27 DIAGNOSIS — Z96649 Presence of unspecified artificial hip joint: Secondary | ICD-10-CM

## 2012-03-27 MED ORDER — MORPHINE SULFATE ER 15 MG PO TBCR: 15.0000 mg | EXTENDED_RELEASE_TABLET | Freq: Two times a day (BID) | ORAL | Status: DC

## 2012-03-27 NOTE — Progress Notes (Signed)
Subjective:     Patient ID: [name], female   DOB: 03-09-1946, 67 y.o.   MRN: [id number] Chief Complaint  Patient presents with  . Follow-up    wound recheck right hip   HPI    1.  Decubitus ulcer of left buttock, stage 4    2.  Cellulitis of right hip    3.  S/P hip replacement      Bipolar hip replacement right    4.  Mechanical complication of internal orthopedic implant      The patient was admitted to the hospital in November 13 for what was a small area of drainage and open wound over the right hip from previous bipolar as prosthetic replacement. She also has a left decubitus ulcer on the left buttock.  She had cultures and they were negative. However her orthopedic implant has migrated into the pelvis and will require reconstruction if we can get wound to clarify.  Today the wound is still erythematous although it is closed. She's had multiple days of IV vancomycin. She has currently been on doxycycline.  Operative procedure February 5, bipolar replacement RIGHT hip for RIGHT hip fracture.  February 28 evacuation of seroma re\re repair abductor after partial rupture   Review of Systems     Objective:   Physical Exam The right hip incision is red and somewhat tender but closed with no drainage. She has painful range of motion right hip which is restricted by escape from the acetabulum as noted. She is in no way toxic or septic    Assessment:     This case is very difficult to treat her. She basically has had questionable infection although culture was negative and an incarceration of the prosthesis into the pelvis with escape from the acetabulum.  Unfortunately, I cannot revise this case. I have referred the patient to [facility] I am awaiting confirmation that they will see and treat the patient    Plan:     Referral has been made for revision. Chronic pain medication MS Contin 15 mg twice a day with hydrocodone for breakthrough pain Keep patient  nonweightbearing on the right

## 2012-03-27 NOTE — Patient Instructions (Addendum)
Referral has been made to [facility] for revision of a right hip prosthesis versus removal/excisional arthroplasty  In the meantime the patient should be bed to chair no weightbearing on the right lower extremity  I changed her pain medication to MS Contin 15 mg twice a day this can be increased up to 30 mg twice a day as needed  The hydrocodone can be used for breakthrough pain 5 mg every 4 when necessary

## 2012-03-27 NOTE — Telephone Encounter (Signed)
Faxed referral and notes to [facility]. Awaiting appointment.

## 2012-04-08 ENCOUNTER — Telehealth: Payer: Self-pay | Admitting: Orthopedic Surgery

## 2012-04-08 NOTE — Telephone Encounter (Signed)
Ok we will try [facility] and [facility]

## 2012-04-08 NOTE — Telephone Encounter (Signed)
Raynelle Fanning at Dr. Nicola Police office at [facility] in [place] called to say that Dr. Lamar Sprinkles has reviewed the notes for [name] and has declined scheduling the appointment for [place]. His # [phone]

## 2012-04-18 ENCOUNTER — Other Ambulatory Visit: Payer: Self-pay | Admitting: *Deleted

## 2012-04-18 NOTE — Telephone Encounter (Signed)
Sent referral to [facility] # [phone]. Appointment is pending review "Dr. To Dr". Call. Patient and facility ([facility]) aware of status.

## 2012-04-21 NOTE — Telephone Encounter (Signed)
Patient has appointment with Dr. Marisue Humble at [facility] 05/16/12 at 12:30pm. [name], from [facility] orthopedics,  to call Thurnell Lose at [place] with instructions and directions.

## 2012-05-22 ENCOUNTER — Inpatient Hospital Stay (HOSPITAL_COMMUNITY)
Admission: EM | Admit: 2012-05-22 | Discharge: 2012-05-27 | DRG: 560 | Disposition: A | Discharge status: Skilled Nursing Facility | Payer: Medicare Other | Attending: Internal Medicine | Admitting: Internal Medicine

## 2012-05-22 ENCOUNTER — Telehealth: Payer: Self-pay | Admitting: *Deleted

## 2012-05-22 ENCOUNTER — Encounter (HOSPITAL_COMMUNITY): Payer: Self-pay | Admitting: Emergency Medicine

## 2012-05-22 ENCOUNTER — Emergency Department (HOSPITAL_COMMUNITY): Payer: Medicare Other

## 2012-05-22 DIAGNOSIS — F29 Unspecified psychosis not due to a substance or known physiological condition: Secondary | ICD-10-CM | POA: Diagnosis present

## 2012-05-22 DIAGNOSIS — M810 Age-related osteoporosis without current pathological fracture: Secondary | ICD-10-CM | POA: Diagnosis present

## 2012-05-22 DIAGNOSIS — K219 Gastro-esophageal reflux disease without esophagitis: Secondary | ICD-10-CM | POA: Diagnosis present

## 2012-05-22 DIAGNOSIS — M545 Low back pain, unspecified: Secondary | ICD-10-CM | POA: Diagnosis present

## 2012-05-22 DIAGNOSIS — L03115 Cellulitis of right lower limb: Secondary | ICD-10-CM

## 2012-05-22 DIAGNOSIS — D649 Anemia, unspecified: Secondary | ICD-10-CM | POA: Diagnosis present

## 2012-05-22 DIAGNOSIS — G8929 Other chronic pain: Secondary | ICD-10-CM | POA: Diagnosis present

## 2012-05-22 DIAGNOSIS — F411 Generalized anxiety disorder: Secondary | ICD-10-CM | POA: Diagnosis present

## 2012-05-22 DIAGNOSIS — L02419 Cutaneous abscess of limb, unspecified: Secondary | ICD-10-CM | POA: Diagnosis present

## 2012-05-22 DIAGNOSIS — F329 Major depressive disorder, single episode, unspecified: Secondary | ICD-10-CM | POA: Diagnosis present

## 2012-05-22 DIAGNOSIS — F3289 Other specified depressive episodes: Secondary | ICD-10-CM | POA: Diagnosis present

## 2012-05-22 DIAGNOSIS — M25551 Pain in right hip: Secondary | ICD-10-CM

## 2012-05-22 DIAGNOSIS — T8489XA Other specified complication of internal orthopedic prosthetic devices, implants and grafts, initial encounter: Principal | ICD-10-CM | POA: Diagnosis present

## 2012-05-22 DIAGNOSIS — K589 Irritable bowel syndrome without diarrhea: Secondary | ICD-10-CM | POA: Diagnosis present

## 2012-05-22 DIAGNOSIS — Y831 Surgical operation with implant of artificial internal device as the cause of abnormal reaction of the patient, or of later complication, without mention of misadventure at the time of the procedure: Secondary | ICD-10-CM | POA: Diagnosis present

## 2012-05-22 LAB — BASIC METABOLIC PANEL
BUN: 16 mg/dL (ref 6–23)
Calcium: 9.4 mg/dL (ref 8.4–10.5)
Creatinine, Ser: 0.71 mg/dL (ref 0.50–1.10)
GFR calc non Af Amer: 87 mL/min — ABNORMAL LOW (ref >90)
Glucose, Bld: 132 mg/dL — ABNORMAL HIGH (ref 70–99)

## 2012-05-22 LAB — CBC WITH DIFFERENTIAL/PLATELET
Eosinophils Absolute: 0.2 10*3/uL (ref 0.0–0.7)
Eosinophils Relative: 3 % (ref 0–5)
Hemoglobin: 9.5 g/dL — ABNORMAL LOW (ref 12.0–15.0)
Lymphs Abs: 1.5 10*3/uL (ref 0.7–4.0)
MCH: 26 pg (ref 26.0–34.0)
MCV: 81.9 fL (ref 78.0–100.0)
Monocytes Relative: 8 % (ref 3–12)
Platelets: 414 10*3/uL — ABNORMAL HIGH (ref 150–400)
WBC: 6.6 10*3/uL (ref 4.0–10.5)

## 2012-05-22 LAB — HEPATIC FUNCTION PANEL
Albumin: 2.4 g/dL — ABNORMAL LOW (ref 3.5–5.2)
Total Bilirubin: 0.1 mg/dL — ABNORMAL LOW (ref 0.3–1.2)
Total Protein: 6.5 g/dL (ref 6.0–8.3)

## 2012-05-22 LAB — LACTIC ACID, PLASMA: Lactic Acid, Venous: 0.9 mmol/L (ref 0.5–2.2)

## 2012-05-22 LAB — MAGNESIUM: Magnesium: 1.8 mg/dL (ref 1.5–2.5)

## 2012-05-22 MED ORDER — MIRTAZAPINE 30 MG PO TABS
15.0000 mg | ORAL_TABLET | Freq: Every day | ORAL | Status: DC
  Administered 2012-05-23 – 2012-05-26 (×5): 15 mg via ORAL
  Filled 2012-05-22 (×7): qty 1

## 2012-05-22 MED ORDER — SODIUM CHLORIDE 0.9 % IV SOLN
INTRAVENOUS | Status: DC
  Administered 2012-05-22 – 2012-05-26 (×6): via INTRAVENOUS

## 2012-05-22 MED ORDER — OLANZAPINE 5 MG PO TABS
7.5000 mg | ORAL_TABLET | Freq: Every day | ORAL | Status: DC
  Administered 2012-05-23 – 2012-05-26 (×5): 7.5 mg via ORAL
  Filled 2012-05-22 (×3): qty 1
  Filled 2012-05-22 (×2): qty 2
  Filled 2012-05-22: qty 1
  Filled 2012-05-22 (×2): qty 2

## 2012-05-22 MED ORDER — PRO-STAT SUGAR FREE PO LIQD
30.0000 mL | Freq: Two times a day (BID) | ORAL | Status: DC
  Administered 2012-05-23 – 2012-05-27 (×8): 30 mL via ORAL
  Filled 2012-05-22 (×14): qty 30

## 2012-05-22 MED ORDER — FESOTERODINE FUMARATE ER 4 MG PO TB24
4.0000 mg | ORAL_TABLET | Freq: Every day | ORAL | Status: DC
  Administered 2012-05-23 – 2012-05-27 (×5): 4 mg via ORAL
  Filled 2012-05-22 (×7): qty 1

## 2012-05-22 MED ORDER — BUPROPION HCL ER (XL) 300 MG PO TB24
450.0000 mg | ORAL_TABLET | Freq: Every morning | ORAL | Status: DC
  Administered 2012-05-23 – 2012-05-27 (×5): 450 mg via ORAL
  Filled 2012-05-22 (×6): qty 1

## 2012-05-22 MED ORDER — ONDANSETRON HCL 4 MG/2ML IJ SOLN
4.0000 mg | Freq: Once | INTRAMUSCULAR | Status: AC
  Administered 2012-05-22: 4 mg via INTRAVENOUS
  Filled 2012-05-22: qty 2

## 2012-05-22 MED ORDER — HYDROGEL GEL: 1.0000 "application " | Status: DC

## 2012-05-22 MED ORDER — VANCOMYCIN HCL IN DEXTROSE 1-5 GM/200ML-% IV SOLN
1000.0000 mg | Freq: Once | INTRAVENOUS | Status: AC
  Administered 2012-05-23: 1000 mg via INTRAVENOUS
  Filled 2012-05-22: qty 200

## 2012-05-22 MED ORDER — VANCOMYCIN HCL IN DEXTROSE 1-5 GM/200ML-% IV SOLN
1000.0000 mg | Freq: Once | INTRAVENOUS | Status: AC
  Administered 2012-05-22: 1000 mg via INTRAVENOUS
  Filled 2012-05-22: qty 200

## 2012-05-22 MED ORDER — IOHEXOL 300 MG/ML  SOLN
100.0000 mL | Freq: Once | INTRAMUSCULAR | Status: AC | PRN
  Administered 2012-05-22: 100 mL via INTRAVENOUS

## 2012-05-22 MED ORDER — ALPRAZOLAM 0.5 MG PO TABS
0.5000 mg | ORAL_TABLET | Freq: Three times a day (TID) | ORAL | Status: DC
  Administered 2012-05-23 – 2012-05-27 (×14): 0.5 mg via ORAL
  Filled 2012-05-22 (×14): qty 1

## 2012-05-22 MED ORDER — MORPHINE SULFATE ER 15 MG PO TBCR
15.0000 mg | EXTENDED_RELEASE_TABLET | Freq: Two times a day (BID) | ORAL | Status: DC
  Administered 2012-05-23 – 2012-05-27 (×10): 15 mg via ORAL
  Filled 2012-05-22 (×10): qty 1

## 2012-05-22 MED ORDER — FENTANYL 25 MCG/HR TD PT72
25.0000 ug | MEDICATED_PATCH | TRANSDERMAL | Status: DC
  Administered 2012-05-24: 25 ug via TRANSDERMAL
  Filled 2012-05-22: qty 1

## 2012-05-22 MED ORDER — HYDROMORPHONE HCL PF 1 MG/ML IJ SOLN
0.5000 mg | Freq: Once | INTRAMUSCULAR | Status: AC
  Administered 2012-05-22: 0.5 mg via INTRAVENOUS
  Filled 2012-05-22: qty 1

## 2012-05-22 MED ORDER — SIMVASTATIN 20 MG PO TABS
20.0000 mg | ORAL_TABLET | Freq: Every day | ORAL | Status: DC
  Administered 2012-05-23 – 2012-05-26 (×5): 20 mg via ORAL
  Filled 2012-05-22 (×5): qty 1

## 2012-05-22 MED ORDER — GABAPENTIN 300 MG PO CAPS
300.0000 mg | ORAL_CAPSULE | Freq: Three times a day (TID) | ORAL | Status: DC
  Administered 2012-05-23 – 2012-05-27 (×15): 300 mg via ORAL
  Filled 2012-05-22 (×14): qty 1

## 2012-05-22 MED ORDER — ENOXAPARIN SODIUM 40 MG/0.4ML ~~LOC~~ SOLN
40.0000 mg | SUBCUTANEOUS | Status: DC
  Administered 2012-05-23 – 2012-05-26 (×5): 40 mg via SUBCUTANEOUS
  Filled 2012-05-22 (×5): qty 0.4

## 2012-05-22 MED ORDER — BOOST PO LIQD
237.0000 mL | Freq: Two times a day (BID) | ORAL | Status: DC
  Administered 2012-05-23 – 2012-05-26 (×8): 237 mL via ORAL
  Filled 2012-05-22 (×12): qty 237

## 2012-05-22 MED ORDER — PANTOPRAZOLE SODIUM 40 MG PO TBEC: 40.0000 mg | DELAYED_RELEASE_TABLET | Freq: Every day | ORAL | Status: DC

## 2012-05-22 MED ORDER — PANTOPRAZOLE SODIUM 40 MG PO TBEC
40.0000 mg | DELAYED_RELEASE_TABLET | Freq: Every day | ORAL | Status: DC
  Administered 2012-05-23 – 2012-05-27 (×5): 40 mg via ORAL
  Filled 2012-05-22 (×5): qty 1

## 2012-05-22 MED ORDER — LOPERAMIDE HCL 2 MG PO CAPS: 2.0000 mg | ORAL_CAPSULE | Freq: Four times a day (QID) | ORAL | Status: DC | PRN

## 2012-05-22 MED ORDER — VANCOMYCIN HCL 1000 MG IV SOLR
750.0000 mg | Freq: Two times a day (BID) | INTRAVENOUS | Status: DC
  Administered 2012-05-23 – 2012-05-25 (×4): 750 mg via INTRAVENOUS
  Filled 2012-05-22 (×6): qty 750

## 2012-05-22 NOTE — Telephone Encounter (Signed)
Call from Loraine at [facility] regarding [name]'s hip, States she went to [facility] on Friday and they did not do anything. She said patient is crying and her hip is bulging out and has a pus filled area. Spoke with Dr. Romeo Apple and he recommended her call [facility] back. I advised her if she could not get through to them([facility]) send patient to Emergency Room.

## 2012-05-22 NOTE — H&P (Signed)
[id number] 

## 2012-05-22 NOTE — Progress Notes (Signed)
ANTIBIOTIC CONSULT NOTE - INITIAL  Pharmacy Consult for Vancomycin Indication: hip prosthesis infection  Allergies  Allergen Reactions  . Diphenhydramine Other (See Comments)    Feels bad    Patient Measurements: Height: 5\' 7"  (170.2 cm) Weight: 130 lb (58.968 kg) IBW/kg (Calculated) : 61.6  Vital Signs: Temp: 98.6 F (37 C) (02/27 1741) Temp src: Oral (02/27 1741) BP: 96/46 mmHg (02/27 1741) Pulse Rate: 87 (02/27 1741) Intake/Output from previous day:   Intake/Output from this shift:    Labs:  Recent Labs  05/22/12 1756  WBC 6.6  HGB 9.5*  PLT 414*  CREATININE 0.71   Estimated Creatinine Clearance: 63.6 ml/min (by C-G formula based on Cr of 0.71). No results found for this basename: VANCOTROUGH, VANCOPEAK, VANCORANDOM, GENTTROUGH, GENTPEAK, GENTRANDOM, TOBRATROUGH, TOBRAPEAK, TOBRARND, AMIKACINPEAK, AMIKACINTROU, AMIKACIN,  in the last 72 hours   Microbiology: No results found for this or any previous visit (from the past 720 hour(s)).  Medical History: Past Medical History  Diagnosis Date  . IBS (irritable bowel syndrome)   . GERD (gastroesophageal reflux disease)   . Gout   . Chronic back pain   . Chronic right hip pain   . Chronic diarrhea   . Depression   . Anxiety and depression    Medications:   (Not in a hospital admission) Assessment: Okay for Protocol Cellulitis of right hip, S/P hip replacement  Bipolar hip replacement right implant. Estimated Creatinine Clearance: 63.6 ml/min (by C-G formula based on Cr of 0.71). Patient has already received 1gm IV this evening in ER.  Goal of Therapy:  Vancomycin trough level 15-20 mcg/ml  Plan:  Additional 1gm IV in am , then 750mg  IV every 12 hours. Measure antibiotic drug levels at steady state Follow up culture results  Note: Lovenox for VTE prophylaxis will be initiated at 40mg  SQ daily.  Mady Gemma 05/22/2012,10:09 PM

## 2012-05-22 NOTE — ED Provider Notes (Signed)
History     CSN: [id number]  Arrival date & time 05/22/12  1736   First MD Initiated Contact with Patient 05/22/12 1744      Chief Complaint  Patient presents with  . Hip Pain    (Consider location/radiation/quality/duration/timing/severity/associated sxs/prior treatment) The history is provided by the patient, the EMS personnel and the nursing home. The history is limited by the condition of the patient.  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  Patient sent over for a right hip pain and possible infection in that area. Review of the previous notes shows that she was admitted for similar problem in October. Prior to that the patient did have a hip prosthesis and open reduction internal fixation of hip fracture done by Dr. Romeo Apple from orthopedics.  Patient is a historian is not reliable she is complaining of the hip pain no. States it is 8/10. There has been no report of any recent falls or injuries. Patient also has a known decubitus with a dressing on it on the left buttocks area.  Level V caveat applies.  Past Medical History  Diagnosis Date  . IBS (irritable bowel syndrome)   . GERD (gastroesophageal reflux disease)   . Gout   . Chronic back pain   . Chronic right hip pain   . Chronic diarrhea   . Depression   . Anxiety and depression     Past Surgical History  Procedure Laterality Date  . Hip arthroplasty  05/01/2011    Procedure: right ARTHROPLASTY BIPOLAR HIP;  Surgeon: Fuller Canada, MD;  Location: [facility];  Service: Orthopedics;  Laterality: Right;  . Incision and drainage of wound  05/24/2011    Procedure: IRRIGATION AND DEBRIDEMENT WOUND;  Surgeon: Fuller Canada, MD;  Location: [facility];  Service: Orthopedics;  Laterality: Right;  . Incision and drainage hip  02/08/2012    Procedure: IRRIGATION AND DEBRIDEMENT HIP;  Surgeon: Vickki Hearing, MD;  Location: [facility];  Service: Orthopedics;   Laterality: Right;    History reviewed. No pertinent family history.  History  Substance Use Topics  . Smoking status: Never Smoker   . Smokeless tobacco: Not on file  . Alcohol Use: No    OB History   Grav Para Term Preterm Abortions TAB SAB Ect Mult Living                  Review of Systems  Unable to perform ROS Skin: Positive for color change.  level V caveat applies patient condition makes history unreliable.  Allergies  Diphenhydramine  Home Medications   Current Outpatient Rx  Name  Route  Sig  Dispense  Refill  . ALPRAZolam (XANAX) 0.5 MG tablet   Oral   Take 1 tablet (0.5 mg total) by mouth 3 (three) times daily.   30 tablet   2   . Amino Acids-Protein Hydrolys (PRO-STAT AWC) LIQD   Oral   Take 30 mLs by mouth 2 (two) times daily.         Marland Kitchen buPROPion (WELLBUTRIN XL) 150 MG 24 hr tablet   Oral   Take 450 mg by mouth every morning.          Joycelyn Rua Gel Base (HYDROGEL) GEL   Topical   Apply 1 application topically every other day. Applied to the left hip for wound cleansing with normal saline or wound cleanser         . dicyclomine (BENTYL)  10 MG capsule   Oral   Take 1 capsule (10 mg total) by mouth 4 (four) times daily -  before meals and at bedtime.   40 capsule   0   . fentaNYL (DURAGESIC - DOSED MCG/HR) 25 MCG/HR   Transdermal   Place 1 patch onto the skin every 3 (three) days. Applied for right hip pain for 72 hours. Remove and discard used patches         . fesoterodine (TOVIAZ) 4 MG TB24   Oral   Take 1 tablet (4 mg total) by mouth daily.   30 tablet   0   . gabapentin (NEURONTIN) 300 MG capsule   Oral   Take 1 capsule (300 mg total) by mouth 3 (three) times daily.   90 capsule   5   . HYDROcodone-acetaminophen (NORCO) 5-325 MG per tablet   Oral   Take 1 tablet by mouth every 4 (four) hours as needed for pain.   60 tablet   5   . loperamide (IMODIUM) 2 MG capsule   Oral   Take 2-4 mg by mouth 4 (four) times daily  as needed. For diarrhea, take 2 tablets (4mg ) initially then take 2 mg after each loose stool         . mirtazapine (REMERON) 15 MG tablet   Oral   Take 15 mg by mouth every evening.         Marland Kitchen morphine (MS CONTIN) 15 MG 12 hr tablet   Oral   Take 1 tablet (15 mg total) by mouth 2 (two) times daily.   60 tablet   0   . Nutritional Supplements (RESOURCE 2.0) LIQD   Oral   Take 90 mLs by mouth 2 (two) times daily.         Marland Kitchen OLANZapine (ZYPREXA) 7.5 MG tablet   Oral   Take 7.5 mg by mouth at bedtime.         Marland Kitchen omeprazole (PRILOSEC) 20 MG capsule   Oral   Take 20 mg by mouth daily.         . simvastatin (ZOCOR) 20 MG tablet   Oral   Take 20 mg by mouth at bedtime.            BP 95/53  Pulse 76  Temp(Src) 98.6 F (37 C) (Oral)  Resp 18  Ht 5\' 7"  (1.702 m)  Wt 130 lb (58.968 kg)  BMI 20.36 kg/m2  SpO2 96%  Physical Exam  Nursing note and vitals reviewed. Constitutional: She appears well-developed and well-nourished. No distress.  HENT:  Head: Normocephalic and atraumatic.  Mouth/Throat: Oropharynx is clear and moist.  Eyes: Conjunctivae and EOM are normal. Pupils are equal, round, and reactive to light.  Neck: Normal range of motion.  Cardiovascular: Normal rate, regular rhythm and normal heart sounds.   No murmur heard. Pulmonary/Chest: Effort normal and breath sounds normal. No respiratory distress.  Abdominal: Soft. There is no tenderness.  Musculoskeletal: She exhibits tenderness.  Normal except for the following. Right hip area lateral longitudinal incision with surrounding erythema probably about 15 cm central area some questionable fluctuance. No real induration. Incision itself is well-healed. Tender to palpation increased pain with movement of the hip. Also left buttocks area with a decubitus dressing in place. Distally cap refill is normal at 2 seconds. Patient has good range of motion of the feet and toes.  Neurological: She is alert. No cranial  nerve deficit. She exhibits normal muscle tone. Coordination normal.  Skin: Skin is warm. There is erythema.    ED Course  Procedures (including critical care time)  Labs Reviewed  CBC WITH DIFFERENTIAL - Abnormal; Notable for the following:    RBC 3.65 (*)    Hemoglobin 9.5 (*)    HCT 29.9 (*)    RDW 15.7 (*)    Platelets 414 (*)    All other components within normal limits  BASIC METABOLIC PANEL - Abnormal; Notable for the following:    Sodium 133 (*)    Chloride 94 (*)    Glucose, Bld 132 (*)    GFR calc non Af Amer 87 (*)    All other components within normal limits  HEPATIC FUNCTION PANEL - Abnormal; Notable for the following:    Albumin 2.4 (*)    Alkaline Phosphatase 129 (*)    Total Bilirubin 0.1 (*)    All other components within normal limits  RETICULOCYTES - Abnormal; Notable for the following:    RBC. 3.68 (*)    All other components within normal limits  CULTURE, BLOOD (ROUTINE X 2)  CULTURE, BLOOD (ROUTINE X 2)  LACTIC ACID, PLASMA  MAGNESIUM  BASIC METABOLIC PANEL  TSH  URINALYSIS, ROUTINE W REFLEX MICROSCOPIC  VITAMIN B12  FOLATE  IRON AND TIBC  FERRITIN   Ct Hip Right W Contrast  05/22/2012  *RADIOLOGY REPORT*  Clinical Data: Hip pain.  Possible infection, recent surgery.  CT OF THE RIGHT HIP WITH CONTRAST  Technique:  Multidetector CT imaging was performed following the standard protocol during bolus administration of intravenous contrast.  Contrast: OMNIPAQUE IOHEXOL 300 MG/ML  SOLN  Comparison: CT right hip 01/13/2012.  CT pelvis 01/22/2012.  Findings: Redemonstrated is bipolar right hip replacement. Extensive streak artifact is noted as a result of the metallic components.  There is subsidence of the bipolar prosthesis into the acetabulum again noted with regional sclerosis and cephalad migration.  No loosening of the femoral component is seen.  There is increasing heterotopic ossification surrounding the proximal aspect of the femoral component.   Again noted is   cellulitic change and subcutaneous stranding in the region of the greater trochanter.  The soft tissue inflammatory component appears increased from priors suggesting cellulitis and myositis.  There is no drainable abscess identified.  Small bubbles of air are seen in the soft tissues lateral to the greater trochanter (images 53 and 54, series 2), which were not clearly noted previously.   Concern is raised for infection.  No definite changes of osteomyelitis.  IMPRESSION: Postoperative changes as described with subsidence of the bipolar prosthesis.  Increasingly prominent changes of cellulitis, now with small bubbles of air laterally.  There is no drainable fluid collection, but concern is raised for infection of the soft tissues along the course of the incision.   Original Report Authenticated By: Davonna Belling, M.D.    Results for orders placed during the hospital encounter of 05/22/12  CBC WITH DIFFERENTIAL      Result Value Range   WBC 6.6  4.0 - 10.5 K/uL   RBC 3.65 (*) 3.87 - 5.11 MIL/uL   Hemoglobin 9.5 (*) 12.0 - 15.0 g/dL   HCT 16.1 (*) 09.6 - 04.5 %   MCV 81.9  78.0 - 100.0 fL   MCH 26.0  26.0 - 34.0 pg   MCHC 31.8  30.0 - 36.0 g/dL   RDW 40.9 (*) 81.1 - 91.4 %   Platelets 414 (*) 150 - 400 K/uL   Neutrophils Relative 66  43 - 77 %   Neutro Abs 4.4  1.7 - 7.7 K/uL   Lymphocytes Relative 22  12 - 46 %   Lymphs Abs 1.5  0.7 - 4.0 K/uL   Monocytes Relative 8  3 - 12 %   Monocytes Absolute 0.5  0.1 - 1.0 K/uL   Eosinophils Relative 3  0 - 5 %   Eosinophils Absolute 0.2  0.0 - 0.7 K/uL   Basophils Relative 0  0 - 1 %   Basophils Absolute 0.0  0.0 - 0.1 K/uL  BASIC METABOLIC PANEL      Result Value Range   Sodium 133 (*) 135 - 145 mEq/L   Potassium 3.6  3.5 - 5.1 mEq/L   Chloride 94 (*) 96 - 112 mEq/L   CO2 29  19 - 32 mEq/L   Glucose, Bld 132 (*) 70 - 99 mg/dL   BUN 16  6 - 23 mg/dL   Creatinine, Ser 4.09  0.50 - 1.10 mg/dL   Calcium 9.4  8.4 - 81.1 mg/dL   GFR  calc non Af Amer 87 (*) >90 mL/min   GFR calc Af Amer >90  >90 mL/min  LACTIC ACID, PLASMA      Result Value Range   Lactic Acid, Venous 0.9  0.5 - 2.2 mmol/L  MAGNESIUM      Result Value Range   Magnesium 1.8  1.5 - 2.5 mg/dL  HEPATIC FUNCTION PANEL      Result Value Range   Total Protein 6.5  6.0 - 8.3 g/dL   Albumin 2.4 (*) 3.5 - 5.2 g/dL   AST 20  0 - 37 U/L   ALT 12  0 - 35 U/L   Alkaline Phosphatase 129 (*) 39 - 117 U/L   Total Bilirubin 0.1 (*) 0.3 - 1.2 mg/dL   Bilirubin, Direct <9.1  0.0 - 0.3 mg/dL   Indirect Bilirubin NOT CALCULATED  0.3 - 0.9 mg/dL  RETICULOCYTES      Result Value Range   Retic Ct Pct 0.7  0.4 - 3.1 %   RBC. 3.68 (*) 3.87 - 5.11 MIL/uL   Retic Count, Manual 25.8  19.0 - 186.0 K/uL     1. Hip joint pain, right   2. Cellulitis of right hip       MDM   Patient from [facility]. Presents with fluid right-sided hip pain and possible infection. Patient was seen for similar symptoms October 19 treated for a cellulitis in the hip area prior that Dr. Romeo Apple had done a hip prosthesis in that area.  Workup in the emergency department CT with contrast shows no abscess seems to be consistent with a cellulitis some concern for some irritable is in the area. No crepitus to palpation. Bony aspects of the hip are normal.  Patient is followed by Dr. Felecia Shelling Dr. Delbert Harness will admit the patient consultation with Dr. Romeo Apple may be necessary. Early on in her ED course she was treated with vancomycin. Patient's afebrile no leukocytosis not toxic in the emergency department.        Shelda Jakes, MD 05/22/12 [phone]

## 2012-05-22 NOTE — ED Notes (Signed)
Pt comes from [facility] with c/o right hip pain and possible infection in right hip. Pt recently had sx on right hip and is currently at [facility] for rehabilitation.

## 2012-05-23 ENCOUNTER — Encounter (HOSPITAL_COMMUNITY): Payer: Self-pay | Admitting: *Deleted

## 2012-05-23 LAB — FERRITIN: Ferritin: 263 ng/mL (ref 10–291)

## 2012-05-23 LAB — URINALYSIS, ROUTINE W REFLEX MICROSCOPIC
Glucose, UA: NEGATIVE mg/dL
Ketones, ur: NEGATIVE mg/dL
Leukocytes, UA: NEGATIVE
pH: 6.5 (ref 5.0–8.0)

## 2012-05-23 LAB — BASIC METABOLIC PANEL
Chloride: 104 mEq/L (ref 96–112)
Creatinine, Ser: 0.61 mg/dL (ref 0.50–1.10)
GFR calc Af Amer: >90 mL/min (ref >90)
Potassium: 3.7 mEq/L (ref 3.5–5.1)
Sodium: 142 mEq/L (ref 135–145)

## 2012-05-23 LAB — IRON AND TIBC: Iron: <10 ug/dL — ABNORMAL LOW (ref 42–135)

## 2012-05-23 LAB — VITAMIN B12: Vitamin B-12: 631 pg/mL (ref 211–911)

## 2012-05-23 LAB — FOLATE: Folate: 14.5 ng/mL

## 2012-05-23 MED ORDER — ONDANSETRON HCL 4 MG/2ML IJ SOLN: 4.0000 mg | Freq: Four times a day (QID) | INTRAMUSCULAR | Status: DC | PRN

## 2012-05-23 MED ORDER — HYDROCODONE-ACETAMINOPHEN 5-325 MG PO TABS
1.0000 | ORAL_TABLET | Freq: Four times a day (QID) | ORAL | Status: DC | PRN
  Administered 2012-05-23 – 2012-05-26 (×6): 1 via ORAL
  Filled 2012-05-23 (×7): qty 1

## 2012-05-23 MED ORDER — ONDANSETRON HCL 4 MG/2ML IJ SOLN
INTRAMUSCULAR | Status: AC
  Filled 2012-05-23: qty 2

## 2012-05-23 MED ORDER — ALTEPLASE 2 MG IJ SOLR
2.0000 mg | Freq: Once | INTRAMUSCULAR | Status: AC
  Administered 2012-05-23: 2 mg
  Filled 2012-05-23: qty 2

## 2012-05-23 MED ORDER — VANCOMYCIN HCL IN DEXTROSE 1-5 GM/200ML-% IV SOLN
INTRAVENOUS | Status: AC
  Filled 2012-05-23: qty 200

## 2012-05-23 NOTE — Progress Notes (Signed)
Subjective: I have reviewed the images and the ER records and history and physical dictated. 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 I have attempted to remove the prosthesis at the last surgical drainage.  However, secondary to the acetabular erosion and prosthetic escape from the acetabulum I could not remove the prosthesis safely. There was so much scar tissue that I could not even get to the prosthesis.  At this time I would recommend IV antibiotics for 6 weeks with a PICC line. I will need to see what the doctors at [facility] said about this case. I cannot operate on her again. I do not feel it would be safe. I do not feel comfortable taking the prosthesis out of the pelvis with the risk of bowel injury and neurovascular injury  She currently has a normal white count. She is not septic.  I Will not be able to see her today I will see her tomorrow morning  Objective: Vital signs in last 24 hours: Temp:  [98.6 F (37 C)-99.7 F (37.6 C)] 99.7 F (37.6 C) (02/28 0445) Pulse Rate:  [76-87] 77 (02/28 0445) Resp:  [18] 18 (02/28 0445) BP: (92-99)/(46-58) 92/52 mmHg (02/28 0445) SpO2:  [94 %-98 %] 96 % (02/28 0445) Weight:  [125 lb 8 oz (56.926 kg)-130 lb (58.968 kg)] 125 lb 8 oz (56.926 kg) (02/27 2313)  Intake/Output from previous day: 02/27 0701 - 02/28 0700 In: -  Out: 600 [Urine:600] Intake/Output this shift:     Recent Labs  05/22/12 1756  HGB 9.5*    Recent Labs  05/22/12 1756  WBC 6.6  RBC 3.65*  HCT 29.9*  PLT 414*    Recent Labs  05/22/12 1756 05/23/12 0528  NA 133* 142  K 3.6 3.7  CL 94* 104  CO2 29 27  BUN 16 8  CREATININE 0.71 0.61  GLUCOSE 132* 95  CALCIUM 9.4 9.3   No results found for this basename: LABPT, INR,  in the last 72 hours   Fuller Canada 05/23/2012, 10:02 AM

## 2012-05-23 NOTE — Progress Notes (Signed)
Pt c/o of nausea.  Dr. Renard Matter paged.  Returned page and gave orders for patient to have Zofran 4mg  IV every 6 hours prn nausea/vomiting.  Orders followed.

## 2012-05-23 NOTE — H&P (Signed)
[name], BREELAND NO.:  [id number]  MEDICAL RECORD NO.:  [id number]  LOCATION:  A327                          FACILITY:  [facility]  PHYSICIAN:  Melvyn Novas, MDDATE OF BIRTH:  04/26/1945  DATE OF ADMISSION:  05/22/2012 DATE OF DISCHARGE:  LH                             HISTORY & PHYSICAL   HISTORY OF PRESENT ILLNESS:  The patient is a 67 year old white female, status post right hip bipolar hip prosthesis several weeks ago, who is undergoing a rehab at [facility].  The patient of Dr. Felecia Shelling. Apparently, she complains of increasing hip pain over the preceding several days.  There seems to be some significant erythema, swelling, and rubor over the outer thigh area.  CT scan of the hip done tonight reveals a postop changes which are as previous.  However, there is increasing prominent changes of cellulitis and soft tissue area surrounding with possible small bubbles of air laterally.  There is no drainable fluid collection.  Concern for cellulitis and infection of the soft tissues was raised surrounding the prosthesis, should be admitted, placed on cultures of blood, urine, and site.  There is no drainage from the site and placed on vancomycin empirically with Orthopedic consult in a.m.  She is admitted to Dr. Letitia Neri service.  She denies any angina, orthopnea, PND, dyspnea, cough, sputum, or hemoptysis.  PAST MEDICAL HISTORY:  Significant for cellulitis of right hip.  History of anemia, microcytic indices, etiology undetermined, irritable bowel syndrome, GERD, chronic low back pain, depression, anxiety, history of gout, history of psychosis, history of seroma surrounding right hip.  PAST SURGICAL HISTORY:  Remarkable for right hip arthroplasty on May 01, 2011.  Incision and drainage on 02/28, and incision and drainage in 01/2012.  She has also had a cholecystectomy.  She does not smoke currently.  She currently resides in [facility] and is  undergoing rehab.  CURRENT MEDICINES:  Xanax 0.5 mg p.o. t.i.d., Wellbutrin XR 25 mg daily for 24 hours 450 mg daily, fentanyl 25 mcg q.72 hours, Toviaz 4 mg p.o. daily, Neurontin 300 mg p.o. t.i.d., Remeron 15 mg p.o. at bedtime, MS Contin 15 mg p.o. b.i.d., Zyprexa 7.5 mg p.o. at bedtime, Protonix 40 mg p.o. daily, simvastatin 20 mg p.o. at bedtime.  ALLERGIES:  She has no known allergies.  REVIEW OF SYSTEMS:  Negative for seizures, tremors, polyuria, polydipsia, nausea, vomiting, melena, hematemesis, hematochezia.  PHYSICAL EXAMINATION:  VITAL SIGNS:  Blood pressure is currently 96/46, which responded to intravenous fluids.  Temperature 98.6, pulse is 87 and regular, respiratory rate is 18, O2 sat is 98%.  Hemoglobin 9.5. HEENT:  Eyes: PERRLA intact.  Sclerae clear.  Conjunctivae pink. NECK:  No JVD.  No carotid bruits.  No thyromegaly.  No thyroid bruits. LUNGS:  Prolonged expiratory phase.  Diminished breath sounds at bases. No rales, wheeze, or rhonchi appreciable. HEART:  Regular rhythm.  No murmurs, gallops, heaves, thrills, or rubs. ABDOMEN:  Soft, nontender.  Bowel sounds normoactive.  No guarding, rebound, mass, or organomegaly.  Right hip shows diffuse erythema, rubor, and tenderness, some swelling and there is limitation of motion with movement and increased pain. EXTREMITIES:  Trace to 1+  pedal edema. NEUROLOGIC:  Cranial nerves II through XII grossly intact.  The patient moves all 4 extremities.  Plantars downgoing.  IMPRESSION: 1. Cellulitis of right hip status post prosthesis with possible air     bubbles and soft tissues. 2. Anemia; hemoglobin 9.5, etiology undetermined. 3. Depression, anxiety. 4. History of psychosis. 5. Irritable bowel syndrome, gastroesophageal reflux disease. 6. Chronic low back pain and osteoporosis.  PLAN:  To admit, get blood cultures, culture wound, and empiric, give vancomycin.  We will get Orthopedic consult in a.m.  Get lactic acid  to rule out any septicemia and culture urine.     Melvyn Novas, MD     RMD/MEDQ  D:  05/22/2012  T:  05/23/2012  Job:  [id number]

## 2012-05-23 NOTE — Progress Notes (Signed)
UR Chart Review Completed  

## 2012-05-23 NOTE — Progress Notes (Signed)
PICC line TPAed.  Clot broke loose, 5cc blood withdrawn.  10 cc NS instilled.

## 2012-05-23 NOTE — Clinical Social Work Psychosocial (Signed)
Clinical Social Work Department BRIEF PSYCHOSOCIAL ASSESSMENT 05/23/2012  Patient:  CLAUDENE, GATLIFF     Account Number:  [id number]     Admit date:  05/22/2012  Clinical Social Worker:  Nancie Neas  Date/Time:  05/23/2012 09:15 AM  Referred by:  CSW  Date Referred:  05/23/2012 Referred for  SNF Placement   Other Referral:   Interview type:  Patient Other interview type:   Lauris Poag- guardian    PSYCHOSOCIAL DATA Living Status:  FACILITY Admitted from facility:  [facility] Level of care:  Skilled Nursing Facility Primary support name:  Lauris Poag Primary support relationship to patient:  NONE Degree of support available:   guardian    CURRENT CONCERNS Current Concerns  Post-Acute Placement   Other Concerns:    SOCIAL WORK ASSESSMENT / PLAN CSW met with pt at bedside. Pt alert but oriented to self only. She is very anxious about where she will be going at d/c. CSW informed her that [facility] was agreeable to her returning and she appeared satisfied, but then quickly asked again about where she would go. CSW spoke with Eunice Blase at facility who reports pt has been resident there since November 2013 after hospitalization at [facility]. Pt is receiving restorative therapy and will be long term resident. Okay to return at d/c per Debbie. CSW left voicemail for pt's guardian at [facility] [place], [name]. Awaiting return call.   Assessment/plan status:  Psychosocial Support/Ongoing Assessment of Needs Other assessment/ plan:   Information/referral to community resources:   [facility]    PATIENT'S/FAMILY'S RESPONSE TO PLAN OF CARE: Pt has legal guardian and attempts have been made to contact her this morning. One number, the voicemail is full and message left at [phone]. CSW to continue to follow.        Derenda Fennel, [place] [phone]

## 2012-05-23 NOTE — Progress Notes (Signed)
Subjective:  complains of rt hip pain. She was admitted last night due infected prosthesis and cellulitis of the rt hip.  Objective: Vital signs in last 24 hours: Temp:  [98.6 F (37 C)-99.7 F (37.6 C)] 99.7 F (37.6 C) (02/28 0445) Pulse Rate:  [76-87] 77 (02/28 0445) Resp:  [18] 18 (02/28 0445) BP: (92-99)/(46-58) 92/52 mmHg (02/28 0445) SpO2:  [94 %-98 %] 96 % (02/28 0445) Weight:  [56.926 kg (125 lb 8 oz)-58.968 kg (130 lb)] 56.926 kg (125 lb 8 oz) (02/27 2313) Weight change:  Last BM Date: 05/21/12  Intake/Output from previous day: 02/27 0701 - 02/28 0700 In: -  Out: 600 [Urine:600]  PHYSICAL EXAM General appearance: alert and no distress Resp: clear to auscultation bilaterally Cardio: regular rate and rhythm, S1, S2 normal, no murmur, click, rub or gallop GI: soft, non-tender; bowel sounds normal; no masses,  no organomegaly Extremities: rt hip wound is dressed. There is diffuse redness and tendeeness  Lab Results:    @labtest @ [facility] No results found for this basename: PHART, PCO2, PO2ART, TCO2, HCO3,  in the last 72 hours CULTURES Recent Results (from the past 240 hour(s))  CULTURE, BLOOD (ROUTINE X 2)     Status: None   Collection Time    05/22/12  9:42 PM      Result Value Range Status   Specimen Description BLOOD RIGHT ARM   Final   Special Requests BOTTLES DRAWN AEROBIC AND ANAEROBIC 6CC   Final   Culture NO GROWTH 1 DAY   Final   Report Status PENDING   Incomplete  CULTURE, BLOOD (ROUTINE X 2)     Status: None   Collection Time    05/22/12  9:47 PM      Result Value Range Status   Specimen Description BLOOD RIGHT HAND   Final   Special Requests BOTTLES DRAWN AEROBIC AND ANAEROBIC 6CC   Final   Culture NO GROWTH 1 DAY   Final   Report Status PENDING   Incomplete   Studies/Results: Ct Hip Right W Contrast  05/22/2012  *RADIOLOGY REPORT*  Clinical Data: Hip pain.  Possible infection, recent surgery.  CT OF THE RIGHT HIP WITH CONTRAST  Technique:   Multidetector CT imaging was performed following the standard protocol during bolus administration of intravenous contrast.  Contrast: OMNIPAQUE IOHEXOL 300 MG/ML  SOLN  Comparison: CT right hip 01/13/2012.  CT pelvis 01/22/2012.  Findings: Redemonstrated is bipolar right hip replacement. Extensive streak artifact is noted as a result of the metallic components.  There is subsidence of the bipolar prosthesis into the acetabulum again noted with regional sclerosis and cephalad migration.  No loosening of the femoral component is seen.  There is increasing heterotopic ossification surrounding the proximal aspect of the femoral component.  Again noted is   cellulitic change and subcutaneous stranding in the region of the greater trochanter.  The soft tissue inflammatory component appears increased from priors suggesting cellulitis and myositis.  There is no drainable abscess identified.  Small bubbles of air are seen in the soft tissues lateral to the greater trochanter (images 53 and 54, series 2), which were not clearly noted previously.   Concern is raised for infection.  No definite changes of osteomyelitis.  IMPRESSION: Postoperative changes as described with subsidence of the bipolar prosthesis.  Increasingly prominent changes of cellulitis, now with small bubbles of air laterally.  There is no drainable fluid collection, but concern is raised for infection of the soft tissues along the course  of the incision.   Original Report Authenticated By: Davonna Belling, M.D.     Medications: I have reviewed the patient's current medications.  Assesment: 1. Infection of Rt hip prosthesis 2. Cellulitis of rt hip 3. Anemia 4. H/o psychosis 5. H/O IBs Active Problems:   * No active hospital problems. *    Plan: Continue Iv antibiotics We will follow culture result Orthopedic consult    LOS: 1 day   Robi Dewolfe 05/23/2012, 8:38 AM

## 2012-05-23 NOTE — Progress Notes (Signed)
Pt's PICC line will not flush. Unable to get blood return.  Dr. Felecia Shelling notified.  Gave order for TPA.  Orders followed.

## 2012-05-23 NOTE — Care Management Note (Addendum)
    Page 1 of 1   05/27/2012     11:55:02 AM   CARE MANAGEMENT NOTE 05/27/2012  Patient:  [name], [name]   Account Number:  [id number]  Date Initiated:  05/23/2012  Documentation initiated by:  Sharrie Rothman  Subjective/Objective Assessment:   Pt admitted from [facility] with cellulitis. Pt will return to [facility] with IV AB at discharge.     Action/Plan:   Pt awaiting ortho consult. CSW will arrange discharge to facility when medically stable.   Anticipated DC Date:  05/26/2012   Anticipated DC Plan:  SKILLED NURSING FACILITY  In-house referral  Clinical Social Worker      DC Planning Services  CM consult      Choice offered to / List presented to:             Status of service:  Completed, signed off Medicare Important Message given?  YES (If response is "NO", the following Medicare IM given date fields will be blank) Date Medicare IM given:  05/27/2012 Date Additional Medicare IM given:    Discharge Disposition:  SKILLED NURSING FACILITY  Per UR Regulation:    If discussed at Long Length of Stay Meetings, dates discussed:   05/27/2012    Comments:  05/27/12 1155 Arlyss Queen, RN BSN CM Received return phone call from pts guardian, Lauris Poag. IM given over the phone and CSW witnessed IM.  05/27/12 1125 Arlyss Queen, RN BSN CM Pt discharged to [facility] today. Unable to reach guardian, [name], via numerous phone calls by [facility] and CSW. copy of IM sent with pt. CSW to arrange discharge to facility.  05/23/12 1123 Arlyss Queen, RN BSN CM

## 2012-05-24 DIAGNOSIS — M25559 Pain in unspecified hip: Secondary | ICD-10-CM

## 2012-05-24 DIAGNOSIS — L02419 Cutaneous abscess of limb, unspecified: Secondary | ICD-10-CM

## 2012-05-24 LAB — BASIC METABOLIC PANEL
CO2: 27 mEq/L (ref 19–32)
Chloride: 108 mEq/L (ref 96–112)
Glucose, Bld: 95 mg/dL (ref 70–99)
Potassium: 3.7 mEq/L (ref 3.5–5.1)
Sodium: 143 mEq/L (ref 135–145)

## 2012-05-24 NOTE — Progress Notes (Signed)
ANTIBIOTIC CONSULT NOTE   Pharmacy Consult for Vancomycin Indication: hip prosthesis infection  Allergies  Allergen Reactions  . Diphenhydramine Other (See Comments)    Feels bad   Patient Measurements: Height: 5\' 7"  (170.2 cm) Weight: 125 lb 8 oz (56.926 kg) IBW/kg (Calculated) : 61.6  Vital Signs: Temp: 98.7 F (37.1 C) (03/01 0300) Temp src: Oral (03/01 0300) BP: 94/55 mmHg (03/01 0300) Pulse Rate: 89 (03/01 0300) Intake/Output from previous day: 02/28 0701 - 03/01 0700 In: 3849.5 [P.O.:717; I.V.:2432.5; IV Piggyback:700] Out: -  Intake/Output from this shift:    Labs:  Recent Labs  05/22/12 1756 05/23/12 0528 05/24/12 0554  WBC 6.6  --   --   HGB 9.5*  --   --   PLT 414*  --   --   CREATININE 0.71 0.61 0.57   Estimated Creatinine Clearance: 61.3 ml/min (by C-G formula based on Cr of 0.57). No results found for this basename: VANCOTROUGH, Leodis Binet, VANCORANDOM, GENTTROUGH, GENTPEAK, GENTRANDOM, TOBRATROUGH, TOBRAPEAK, TOBRARND, AMIKACINPEAK, AMIKACINTROU, AMIKACIN,  in the last 72 hours   Microbiology: Recent Results (from the past 720 hour(s))  CULTURE, BLOOD (ROUTINE X 2)     Status: None   Collection Time    05/22/12  9:42 PM      Result Value Range Status   Specimen Description BLOOD RIGHT ARM   Final   Special Requests BOTTLES DRAWN AEROBIC AND ANAEROBIC 6CC   Final   Culture NO GROWTH 2 DAYS   Final   Report Status PENDING   Incomplete  CULTURE, BLOOD (ROUTINE X 2)     Status: None   Collection Time    05/22/12  9:47 PM      Result Value Range Status   Specimen Description BLOOD RIGHT HAND   Final   Special Requests BOTTLES DRAWN AEROBIC AND ANAEROBIC 6CC   Final   Culture NO GROWTH 2 DAYS   Final   Report Status PENDING   Incomplete  MRSA PCR SCREENING     Status: None   Collection Time    05/23/12  6:20 PM      Result Value Range Status   MRSA by PCR NEGATIVE  NEGATIVE Final   Comment:            The GeneXpert MRSA Assay (FDA   approved for NASAL specimens     only), is one component of a     comprehensive MRSA colonization     surveillance program. It is not     intended to diagnose MRSA     infection nor to guide or     monitor treatment for     MRSA infections.   Medical History: Past Medical History  Diagnosis Date  . IBS (irritable bowel syndrome)   . GERD (gastroesophageal reflux disease)   . Gout   . Chronic back pain   . Chronic right hip pain   . Chronic diarrhea   . Depression   . Anxiety and depression    Medications:  Prescriptions prior to admission  Medication Sig Dispense Refill  . ALPRAZolam (XANAX) 0.5 MG tablet Take 1 tablet (0.5 mg total) by mouth 3 (three) times daily.  30 tablet  2  . Amino Acids-Protein Hydrolys (PRO-STAT AWC) LIQD Take 30 mLs by mouth 2 (two) times daily.      Marland Kitchen buPROPion (WELLBUTRIN XL) 150 MG 24 hr tablet Take 450 mg by mouth every morning.       Joycelyn Rua Gel Base (HYDROGEL) GEL  Apply 1 application topically every other day. Applied to the left hip for wound cleansing with normal saline or wound cleanser      . dicyclomine (BENTYL) 10 MG capsule Take 1 capsule (10 mg total) by mouth 4 (four) times daily -  before meals and at bedtime.  40 capsule  0  . fentaNYL (DURAGESIC - DOSED MCG/HR) 25 MCG/HR Place 1 patch onto the skin every 3 (three) days. Applied for right hip pain for 72 hours. Remove and discard used patches      . fesoterodine (TOVIAZ) 4 MG TB24 Take 1 tablet (4 mg total) by mouth daily.  30 tablet  0  . gabapentin (NEURONTIN) 300 MG capsule Take 1 capsule (300 mg total) by mouth 3 (three) times daily.  90 capsule  5  . HYDROcodone-acetaminophen (NORCO) 5-325 MG per tablet Take 1 tablet by mouth every 4 (four) hours as needed for pain.  60 tablet  5  . loperamide (IMODIUM) 2 MG capsule Take 2-4 mg by mouth 4 (four) times daily as needed. For diarrhea, take 2 tablets (4mg ) initially then take 2 mg after each loose stool      . mirtazapine (REMERON) 15  MG tablet Take 15 mg by mouth every evening.      Marland Kitchen morphine (MS CONTIN) 15 MG 12 hr tablet Take 1 tablet (15 mg total) by mouth 2 (two) times daily.  60 tablet  0  . Nutritional Supplements (RESOURCE 2.0) LIQD Take 90 mLs by mouth 2 (two) times daily.      Marland Kitchen OLANZapine (ZYPREXA) 7.5 MG tablet Take 7.5 mg by mouth at bedtime.      Marland Kitchen omeprazole (PRILOSEC) 20 MG capsule Take 20 mg by mouth daily.      . simvastatin (ZOCOR) 20 MG tablet Take 20 mg by mouth at bedtime.        Assessment: Okay for Protocol.  Cellulitis of right hip, S/P hip replacement  Bipolar hip replacement right implant. Estimated Creatinine Clearance: 61.3 ml/min (by C-G formula based on Cr of 0.57). Anticipate 6 weeks of IV ABX per Dr Romeo Apple.  Goal of Therapy:  Vancomycin trough level 15-20 mcg/ml  Plan:  Vancomycin 750mg  IV q12hrs Check trough tomorrow and weekly. Measure antibiotic drug levels at steady state Follow up culture results  Valrie Hart A 05/24/2012,9:41 AM

## 2012-05-24 NOTE — Progress Notes (Signed)
Subjective:  Patient continue to complain of pain i her hip and knees. She is receiving IV antibiotics. Objective: Vital signs in last 24 hours: Temp:  [98.7 F (37.1 C)-98.9 F (37.2 C)] 98.7 F (37.1 C) (03/01 0300) Pulse Rate:  [78-89] 89 (03/01 0300) Resp:  [16-18] 18 (03/01 0300) BP: (81-94)/(46-55) 94/55 mmHg (03/01 0300) SpO2:  [95 %-99 %] 95 % (03/01 0300) Weight change:  Last BM Date: 05/21/12  Intake/Output from previous day: 02/28 0701 - 03/01 0700 In: 3849.5 [P.O.:717; I.V.:2432.5; IV Piggyback:700] Out: -   PHYSICAL EXAM General appearance: alert and no distress Resp: clear to auscultation bilaterally Cardio: regular rate and rhythm, S1, S2 normal, no murmur, click, rub or gallop GI: soft, non-tender; bowel sounds normal; no masses,  no organomegaly Extremities: rt hip wound is dressed. There is diffuse redness and tendeeness  Lab Results:    @labtest @ ABGS No results found for this basename: PHART, PCO2, PO2ART, TCO2, HCO3,  in the last 72 hours CULTURES Recent Results (from the past 240 hour(s))  CULTURE, BLOOD (ROUTINE X 2)     Status: None   Collection Time    05/22/12  9:42 PM      Result Value Range Status   Specimen Description BLOOD RIGHT ARM   Final   Special Requests BOTTLES DRAWN AEROBIC AND ANAEROBIC 6CC   Final   Culture NO GROWTH 2 DAYS   Final   Report Status PENDING   Incomplete  CULTURE, BLOOD (ROUTINE X 2)     Status: None   Collection Time    05/22/12  9:47 PM      Result Value Range Status   Specimen Description BLOOD RIGHT HAND   Final   Special Requests BOTTLES DRAWN AEROBIC AND ANAEROBIC 6CC   Final   Culture NO GROWTH 2 DAYS   Final   Report Status PENDING   Incomplete  MRSA PCR SCREENING     Status: None   Collection Time    05/23/12  6:20 PM      Result Value Range Status   MRSA by PCR NEGATIVE  NEGATIVE Final   Comment:            The GeneXpert MRSA Assay (FDA     approved for NASAL specimens     only), is one  component of a     comprehensive MRSA colonization     surveillance program. It is not     intended to diagnose MRSA     infection nor to guide or     monitor treatment for     MRSA infections.   Studies/Results: Ct Hip Right W Contrast  05/22/2012  *RADIOLOGY REPORT*  Clinical Data: Hip pain.  Possible infection, recent surgery.  CT OF THE RIGHT HIP WITH CONTRAST  Technique:  Multidetector CT imaging was performed following the standard protocol during bolus administration of intravenous contrast.  Contrast: OMNIPAQUE IOHEXOL 300 MG/ML  SOLN  Comparison: CT right hip 01/13/2012.  CT pelvis 01/22/2012.  Findings: Redemonstrated is bipolar right hip replacement. Extensive streak artifact is noted as a result of the metallic components.  There is subsidence of the bipolar prosthesis into the acetabulum again noted with regional sclerosis and cephalad migration.  No loosening of the femoral component is seen.  There is increasing heterotopic ossification surrounding the proximal aspect of the femoral component.  Again noted is   cellulitic change and subcutaneous stranding in the region of the greater trochanter.  The soft tissue inflammatory  component appears increased from priors suggesting cellulitis and myositis.  There is no drainable abscess identified.  Small bubbles of air are seen in the soft tissues lateral to the greater trochanter (images 53 and 54, series 2), which were not clearly noted previously.   Concern is raised for infection.  No definite changes of osteomyelitis.  IMPRESSION: Postoperative changes as described with subsidence of the bipolar prosthesis.  Increasingly prominent changes of cellulitis, now with small bubbles of air laterally.  There is no drainable fluid collection, but concern is raised for infection of the soft tissues along the course of the incision.   Original Report Authenticated By: Davonna Belling, M.D.     Medications: I have reviewed the patient's current  medications.  Assesment: 1. Infection of Rt hip prosthesis 2. Cellulitis of rt hip 3. Anemia 4. H/o psychosis 5. H/O IBs Active Problems:   * No active hospital problems. *    Plan: Continue Iv antibiotics We will follow culture result Orthopedic consult appreciated.    LOS: 2 days   Hannelore Bova 05/24/2012, 10:40 AM

## 2012-05-24 NOTE — Consult Note (Signed)
Reason for Consult: Right hip cellulitis Referring Physician: [name] is an 67 y.o. female.  HPI: 67 year-old female with previous right hip fracture treated with bipolar prosthesis. The patient initially did well then developed abductor rupture with seroma. She was then treated with incision drainage and repair  Subsequent to that she developed infection again was treated with IV antibiotics  She was then lost to followup presented back to the hospital again with infection she had incision and drainage was noted acetabular escape  At that procedure was understood chronic inflammatory changes heterotopic bone formation and entrapped prosthetic device. Because of the position of the implant and lack of ability to reconstruct the acetabulum it was felt that further tissue debridement was not warranted. Is also felt that the position of the implant was dangerously close to the pelvis and neurovascular compromise was probable. Therefore incision and drainage was performed with further IV antibiotics  She did well presents back again with continued cellulitis.  White count is normal she is afebrile   Past Medical History  Diagnosis Date  . IBS (irritable bowel syndrome)   . GERD (gastroesophageal reflux disease)   . Gout   . Chronic back pain   . Chronic right hip pain   . Chronic diarrhea   . Depression   . Anxiety and depression     Past Surgical History  Procedure Laterality Date  . Hip arthroplasty  05/01/2011    Procedure: right ARTHROPLASTY BIPOLAR HIP;  Surgeon: Fuller Canada, MD;  Location: [facility];  Service: Orthopedics;  Laterality: Right;  . Incision and drainage of wound  05/24/2011    Procedure: IRRIGATION AND DEBRIDEMENT WOUND;  Surgeon: Fuller Canada, MD;  Location: [facility];  Service: Orthopedics;  Laterality: Right;  . Incision and drainage hip  02/08/2012    Procedure: IRRIGATION AND DEBRIDEMENT HIP;  Surgeon: Vickki Hearing, MD;  Location: [facility];  Service: Orthopedics;  Laterality: Right;    History reviewed. No pertinent family history.  Social History:  reports that she has never smoked. She does not have any smokeless tobacco history on file. She reports that she does not drink alcohol or use illicit drugs.  Allergies:  Allergies  Allergen Reactions  . Diphenhydramine Other (See Comments)    Feels bad    Medications: I have reviewed the patient's current medications.  Results for orders placed during the hospital encounter of 05/22/12 (from the past 48 hour(s))  CBC WITH DIFFERENTIAL     Status: Abnormal   Collection Time    05/22/12  5:56 PM      Result Value Range   WBC 6.6  4.0 - 10.5 K/uL   RBC 3.65 (*) 3.87 - 5.11 MIL/uL   Hemoglobin 9.5 (*) 12.0 - 15.0 g/dL   HCT 96.0 (*) 45.4 - 09.8 %   MCV 81.9  78.0 - 100.0 fL   MCH 26.0  26.0 - 34.0 pg   MCHC 31.8  30.0 - 36.0 g/dL   RDW 11.9 (*) 14.7 - 82.9 %   Platelets 414 (*) 150 - 400 K/uL   Neutrophils Relative 66  43 - 77 %   Neutro Abs 4.4  1.7 - 7.7 K/uL   Lymphocytes Relative 22  12 - 46 %   Lymphs Abs 1.5  0.7 - 4.0 K/uL   Monocytes Relative 8  3 - 12 %   Monocytes Absolute 0.5  0.1 - 1.0 K/uL   Eosinophils Relative 3  0 - 5 %   Eosinophils Absolute 0.2  0.0 - 0.7 K/uL   Basophils Relative 0  0 - 1 %   Basophils Absolute 0.0  0.0 - 0.1 K/uL  BASIC METABOLIC PANEL     Status: Abnormal   Collection Time    05/22/12  5:56 PM      Result Value Range   Sodium 133 (*) 135 - 145 mEq/L   Potassium 3.6  3.5 - 5.1 mEq/L   Chloride 94 (*) 96 - 112 mEq/L   CO2 29  19 - 32 mEq/L   Glucose, Bld 132 (*) 70 - 99 mg/dL   BUN 16  6 - 23 mg/dL   Creatinine, Ser 4.54  0.50 - 1.10 mg/dL   Calcium 9.4  8.4 - 09.8 mg/dL   GFR calc non Af Amer 87 (*) >90 mL/min   GFR calc Af Amer >90  >90 mL/min   Comment:            The eGFR has been calculated     using the CKD EPI equation.     This calculation has not been     validated in all clinical     situations.      eGFR's persistently     <90 mL/min signify     possible Chronic Kidney Disease.  MAGNESIUM     Status: None   Collection Time    05/22/12  9:42 PM      Result Value Range   Magnesium 1.8  1.5 - 2.5 mg/dL  HEPATIC FUNCTION PANEL     Status: Abnormal   Collection Time    05/22/12  9:42 PM      Result Value Range   Total Protein 6.5  6.0 - 8.3 g/dL   Albumin 2.4 (*) 3.5 - 5.2 g/dL   AST 20  0 - 37 U/L   ALT 12  0 - 35 U/L   Alkaline Phosphatase 129 (*) 39 - 117 U/L   Total Bilirubin 0.1 (*) 0.3 - 1.2 mg/dL   Bilirubin, Direct <1.1  0.0 - 0.3 mg/dL   Indirect Bilirubin NOT CALCULATED  0.3 - 0.9 mg/dL  CULTURE, BLOOD (ROUTINE X 2)     Status: None   Collection Time    05/22/12  9:42 PM      Result Value Range   Specimen Description BLOOD RIGHT ARM     Special Requests BOTTLES DRAWN AEROBIC AND ANAEROBIC 6CC     Culture NO GROWTH 2 DAYS     Report Status PENDING    LACTIC ACID, PLASMA     Status: None   Collection Time    05/22/12  9:47 PM      Result Value Range   Lactic Acid, Venous 0.9  0.5 - 2.2 mmol/L  TSH     Status: None   Collection Time    05/22/12  9:47 PM      Result Value Range   TSH 2.516  0.350 - 4.500 uIU/mL  CULTURE, BLOOD (ROUTINE X 2)     Status: None   Collection Time    05/22/12  9:47 PM      Result Value Range   Specimen Description BLOOD RIGHT HAND     Special Requests BOTTLES DRAWN AEROBIC AND ANAEROBIC 6CC     Culture NO GROWTH 2 DAYS     Report Status PENDING    VITAMIN B12     Status: None   Collection Time  05/22/12  9:55 PM      Result Value Range   Vitamin B-12 631  211 - 911 pg/mL  FOLATE     Status: None   Collection Time    05/22/12  9:55 PM      Result Value Range   Folate 14.5     Comment: (NOTE)     Reference Ranges            Deficient:       0.4 - 3.3 ng/mL            Indeterminate:   3.4 - 5.4 ng/mL            Normal:              > 5.4 ng/mL  IRON AND TIBC     Status: Abnormal   Collection Time    05/22/12  9:55 PM       Result Value Range   Iron <10 (*) 42 - 135 ug/dL   TIBC Not calculated due to Iron <10.  250 - 470 ug/dL   Saturation Ratios Not calculated due to Iron <10.  20 - 55 %   UIBC 184  125 - 400 ug/dL  FERRITIN     Status: None   Collection Time    05/22/12  9:55 PM      Result Value Range   Ferritin 263  10 - 291 ng/mL  RETICULOCYTES     Status: Abnormal   Collection Time    05/22/12  9:55 PM      Result Value Range   Retic Ct Pct 0.7  0.4 - 3.1 %   RBC. 3.68 (*) 3.87 - 5.11 MIL/uL   Retic Count, Manual 25.8  19.0 - 186.0 K/uL  URINALYSIS, ROUTINE W REFLEX MICROSCOPIC     Status: Abnormal   Collection Time    05/23/12  5:02 AM      Result Value Range   Color, Urine YELLOW  YELLOW   APPearance CLEAR  CLEAR   Specific Gravity, Urine <1.005 (*) 1.005 - 1.030   pH 6.5  5.0 - 8.0   Glucose, UA NEGATIVE  NEGATIVE mg/dL   Hgb urine dipstick NEGATIVE  NEGATIVE   Bilirubin Urine NEGATIVE  NEGATIVE   Ketones, ur NEGATIVE  NEGATIVE mg/dL   Protein, ur NEGATIVE  NEGATIVE mg/dL   Urobilinogen, UA 0.2  0.0 - 1.0 mg/dL   Nitrite NEGATIVE  NEGATIVE   Leukocytes, UA NEGATIVE  NEGATIVE   Comment: MICROSCOPIC NOT DONE ON URINES WITH NEGATIVE PROTEIN, BLOOD, LEUKOCYTES, NITRITE, OR GLUCOSE <1000 mg/dL.  BASIC METABOLIC PANEL     Status: None   Collection Time    05/23/12  5:28 AM      Result Value Range   Sodium 142  135 - 145 mEq/L   Comment: DELTA CHECK NOTED   Potassium 3.7  3.5 - 5.1 mEq/L   Chloride 104  96 - 112 mEq/L   CO2 27  19 - 32 mEq/L   Glucose, Bld 95  70 - 99 mg/dL   BUN 8  6 - 23 mg/dL   Creatinine, Ser 8.41  0.50 - 1.10 mg/dL   Calcium 9.3  8.4 - 32.4 mg/dL   GFR calc non Af Amer >90  >90 mL/min   GFR calc Af Amer >90  >90 mL/min   Comment:            The eGFR has been calculated     using  the CKD EPI equation.     This calculation has not been     validated in all clinical     situations.     eGFR's persistently     <90 mL/min signify     possible Chronic Kidney  Disease.  MRSA PCR SCREENING     Status: None   Collection Time    05/23/12  6:20 PM      Result Value Range   MRSA by PCR NEGATIVE  NEGATIVE   Comment:            The GeneXpert MRSA Assay (FDA     approved for NASAL specimens     only), is one component of a     comprehensive MRSA colonization     surveillance program. It is not     intended to diagnose MRSA     infection nor to guide or     monitor treatment for     MRSA infections.  BASIC METABOLIC PANEL     Status: None   Collection Time    05/24/12  5:54 AM      Result Value Range   Sodium 143  135 - 145 mEq/L   Potassium 3.7  3.5 - 5.1 mEq/L   Chloride 108  96 - 112 mEq/L   CO2 27  19 - 32 mEq/L   Glucose, Bld 95  70 - 99 mg/dL   BUN 11  6 - 23 mg/dL   Creatinine, Ser 8.41  0.50 - 1.10 mg/dL   Calcium 8.8  8.4 - 32.4 mg/dL   GFR calc non Af Amer >90  >90 mL/min   GFR calc Af Amer >90  >90 mL/min   Comment:            The eGFR has been calculated     using the CKD EPI equation.     This calculation has not been     validated in all clinical     situations.     eGFR's persistently     <90 mL/min signify     possible Chronic Kidney Disease.    Ct Hip Right W Contrast  05/22/2012  *RADIOLOGY REPORT*  Clinical Data: Hip pain.  Possible infection, recent surgery.  CT OF THE RIGHT HIP WITH CONTRAST  Technique:  Multidetector CT imaging was performed following the standard protocol during bolus administration of intravenous contrast.  Contrast: OMNIPAQUE IOHEXOL 300 MG/ML  SOLN  Comparison: CT right hip 01/13/2012.  CT pelvis 01/22/2012.  Findings: Redemonstrated is bipolar right hip replacement. Extensive streak artifact is noted as a result of the metallic components.  There is subsidence of the bipolar prosthesis into the acetabulum again noted with regional sclerosis and cephalad migration.  No loosening of the femoral component is seen.  There is increasing heterotopic ossification surrounding the proximal aspect of  the femoral component.  Again noted is   cellulitic change and subcutaneous stranding in the region of the greater trochanter.  The soft tissue inflammatory component appears increased from priors suggesting cellulitis and myositis.  There is no drainable abscess identified.  Small bubbles of air are seen in the soft tissues lateral to the greater trochanter (images 53 and 54, series 2), which were not clearly noted previously.   Concern is raised for infection.  No definite changes of osteomyelitis.  IMPRESSION: Postoperative changes as described with subsidence of the bipolar prosthesis.  Increasingly prominent changes of cellulitis, now with small bubbles of air laterally.  There is no drainable fluid collection, but concern is raised for infection of the soft tissues along the course of the incision.   Original Report Authenticated By: Davonna Belling, M.D.     Review of Systems  Constitutional: Negative for malaise/fatigue and diaphoresis.  HENT: Positive for ear pain. Negative for hearing loss and ear discharge.   Eyes: Negative for blurred vision.  Respiratory: Negative for cough.   Cardiovascular: Negative for chest pain.  Gastrointestinal: Negative for vomiting.  Genitourinary: Negative for flank pain.  Musculoskeletal: Positive for myalgias and joint pain.  Skin: Negative for itching and rash.  Neurological: Negative for dizziness.  Endo/Heme/Allergies: Does not bruise/bleed easily.  Psychiatric/Behavioral: Negative for depression.   Blood pressure 94/55, pulse 89, temperature 98.7 F (37.1 C), temperature source Oral, resp. rate 18, height 5\' 7"  (1.702 m), weight 125 lb 8 oz (56.926 kg), SpO2 95.00%. Physical Exam  Nursing note and vitals reviewed. Constitutional: She is oriented to person, place, and time. She appears well-developed and well-nourished.  HENT:  Head: Normocephalic.  Eyes: Pupils are equal, round, and reactive to light.  Neck: Normal range of motion. Neck supple.    Cardiovascular: Normal rate and intact distal pulses.   Respiratory: Effort normal.  GI: Soft. She exhibits no distension.  Musculoskeletal:       Legs: Upper extremity exam  The right and left upper extremity:  Inspection and palpation revealed no abnormalities in the upper extremities.  Range of motion is full without contracture. Motor exam is normal with grade 5 strength. The joints are fully reduced without subluxation. There is no atrophy or tremor and muscle tone is normal.  All joints are stable.   Left lower extremity there is a small superficial pressure sore over the left greater trochanter with no surrounding erythema. Muscle tone normal. No gross deformity. Joints without contracture or subluxation hip knee and ankle     Lymphadenopathy:    She has no cervical adenopathy.  Neurological: She is alert and oriented to person, place, and time.  Skin: Skin is warm and dry.  Psychiatric: She has a normal mood and affect. Her behavior is normal. Judgment and thought content normal.   Right lower extremity is shortened from the superior escape of the implant from the acetabulum. There is painful range of motion as well.   Assessment/Plan: Again, there is no drainable abscess so antibiotics are appropriate in this setting. I do not feel that incision and drainage is necessary without drainable abscess. This patient again will need followup with a tertiary care facility to determine the best treatment for this situation. On Monday try to get the notes from her visit to [facility] 05/24/2012, 8:58 AM

## 2012-05-25 LAB — BASIC METABOLIC PANEL
BUN: 8 mg/dL (ref 6–23)
Chloride: 106 mEq/L (ref 96–112)
GFR calc Af Amer: >90 mL/min (ref >90)
GFR calc non Af Amer: >90 mL/min (ref >90)
Glucose, Bld: 94 mg/dL (ref 70–99)
Potassium: 3.8 mEq/L (ref 3.5–5.1)
Sodium: 142 mEq/L (ref 135–145)

## 2012-05-25 MED ORDER — VANCOMYCIN HCL IN DEXTROSE 1-5 GM/200ML-% IV SOLN
1000.0000 mg | Freq: Two times a day (BID) | INTRAVENOUS | Status: DC
  Administered 2012-05-25 – 2012-05-27 (×4): 1000 mg via INTRAVENOUS
  Filled 2012-05-25 (×6): qty 200

## 2012-05-25 NOTE — Progress Notes (Signed)
ANTIBIOTIC CONSULT NOTE   Pharmacy Consult for Vancomycin Indication: hip prosthesis infection  Allergies  Allergen Reactions  . Diphenhydramine Other (See Comments)    Feels bad   Patient Measurements: Height: 5\' 7"  (170.2 cm) Weight: 125 lb 8 oz (56.926 kg) IBW/kg (Calculated) : 61.6  Vital Signs: Temp: 98.2 F (36.8 C) (03/02 0556) Temp src: Oral (03/01 2123) BP: 121/60 mmHg (03/02 0556) Pulse Rate: 91 (03/02 0556) Intake/Output from previous day: 03/01 0701 - 03/02 0700 In: 2519.2 [P.O.:1020; I.V.:1199.2; IV Piggyback:300] Out: 1350 [Urine:1350] Intake/Output from this shift:    Labs:  Recent Labs  05/22/12 1756 05/23/12 0528 05/24/12 0554 05/25/12 0439  WBC 6.6  --   --   --   HGB 9.5*  --   --   --   PLT 414*  --   --   --   CREATININE 0.71 0.61 0.57 0.59   Estimated Creatinine Clearance: 61.3 ml/min (by C-G formula based on Cr of 0.59).  Recent Labs  05/25/12 0439  VANCOTROUGH 8.9*    Microbiology: Recent Results (from the past 720 hour(s))  CULTURE, BLOOD (ROUTINE X 2)     Status: None   Collection Time    05/22/12  9:42 PM      Result Value Range Status   Specimen Description BLOOD RIGHT ARM   Final   Special Requests BOTTLES DRAWN AEROBIC AND ANAEROBIC 6CC   Final   Culture NO GROWTH 2 DAYS   Final   Report Status PENDING   Incomplete  CULTURE, BLOOD (ROUTINE X 2)     Status: None   Collection Time    05/22/12  9:47 PM      Result Value Range Status   Specimen Description BLOOD RIGHT HAND   Final   Special Requests BOTTLES DRAWN AEROBIC AND ANAEROBIC 6CC   Final   Culture NO GROWTH 2 DAYS   Final   Report Status PENDING   Incomplete  MRSA PCR SCREENING     Status: None   Collection Time    05/23/12  6:20 PM      Result Value Range Status   MRSA by PCR NEGATIVE  NEGATIVE Final   Comment:            The GeneXpert MRSA Assay (FDA     approved for NASAL specimens     only), is one component of a     comprehensive MRSA colonization   surveillance program. It is not     intended to diagnose MRSA     infection nor to guide or     monitor treatment for     MRSA infections.   Medical History: Past Medical History  Diagnosis Date  . IBS (irritable bowel syndrome)   . GERD (gastroesophageal reflux disease)   . Gout   . Chronic back pain   . Chronic right hip pain   . Chronic diarrhea   . Depression   . Anxiety and depression    Medications:  Prescriptions prior to admission  Medication Sig Dispense Refill  . ALPRAZolam (XANAX) 0.5 MG tablet Take 1 tablet (0.5 mg total) by mouth 3 (three) times daily.  30 tablet  2  . Amino Acids-Protein Hydrolys (PRO-STAT AWC) LIQD Take 30 mLs by mouth 2 (two) times daily.      Marland Kitchen buPROPion (WELLBUTRIN XL) 150 MG 24 hr tablet Take 450 mg by mouth every morning.       . Carbomer Gel Base (HYDROGEL) GEL Apply 1  application topically every other day. Applied to the left hip for wound cleansing with normal saline or wound cleanser      . dicyclomine (BENTYL) 10 MG capsule Take 1 capsule (10 mg total) by mouth 4 (four) times daily -  before meals and at bedtime.  40 capsule  0  . fentaNYL (DURAGESIC - DOSED MCG/HR) 25 MCG/HR Place 1 patch onto the skin every 3 (three) days. Applied for right hip pain for 72 hours. Remove and discard used patches      . fesoterodine (TOVIAZ) 4 MG TB24 Take 1 tablet (4 mg total) by mouth daily.  30 tablet  0  . gabapentin (NEURONTIN) 300 MG capsule Take 1 capsule (300 mg total) by mouth 3 (three) times daily.  90 capsule  5  . HYDROcodone-acetaminophen (NORCO) 5-325 MG per tablet Take 1 tablet by mouth every 4 (four) hours as needed for pain.  60 tablet  5  . loperamide (IMODIUM) 2 MG capsule Take 2-4 mg by mouth 4 (four) times daily as needed. For diarrhea, take 2 tablets (4mg ) initially then take 2 mg after each loose stool      . mirtazapine (REMERON) 15 MG tablet Take 15 mg by mouth every evening.      Marland Kitchen morphine (MS CONTIN) 15 MG 12 hr tablet Take 1 tablet  (15 mg total) by mouth 2 (two) times daily.  60 tablet  0  . Nutritional Supplements (RESOURCE 2.0) LIQD Take 90 mLs by mouth 2 (two) times daily.      Marland Kitchen OLANZapine (ZYPREXA) 7.5 MG tablet Take 7.5 mg by mouth at bedtime.      Marland Kitchen omeprazole (PRILOSEC) 20 MG capsule Take 20 mg by mouth daily.      . simvastatin (ZOCOR) 20 MG tablet Take 20 mg by mouth at bedtime.        Assessment: Okay for Protocol.  Cellulitis of right hip, S/P hip replacement  Bipolar hip replacement right implant. Estimated Creatinine Clearance: 61.3 ml/min (by C-G formula based on Cr of 0.59).   Trough level is below goal.  Renal fxn is stable.  Anticipate 6 weeks of IV ABX per Dr Romeo Apple.  Vancomycin  2/27  >>  Goal of Therapy:  Vancomycin trough level 15-20 mcg/ml  Plan:  Increase Vancomycin to 1000mg  IV q12hrs Check trough weekly.  Monitor renal fxn.  Measure antibiotic drug levels at steady state Follow up culture results  Valrie Hart A 05/25/2012,8:18 AM

## 2012-05-25 NOTE — Progress Notes (Signed)
Subjective: Complains of rt hip pain. No fever or chills. Objective: Vital signs in last 24 hours: Temp:  [98.2 F (36.8 C)-99.7 F (37.6 C)] 98.2 F (36.8 C) (03/02 0556) Pulse Rate:  [86-91] 91 (03/02 0556) Resp:  [20] 20 (03/02 0556) BP: (82-121)/(40-64) 121/60 mmHg (03/02 0556) SpO2:  [95 %-100 %] 100 % (03/02 0556) Weight change:  Last BM Date: 05/21/12  Intake/Output from previous day: 03/01 0701 - 03/02 0700 In: 2519.2 [P.O.:1020; I.V.:1199.2; IV Piggyback:300] Out: 1350 [Urine:1350]  PHYSICAL EXAM General appearance: alert and no distress Resp: clear to auscultation bilaterally Cardio: regular rate and rhythm, S1, S2 normal, no murmur, click, rub or gallop GI: soft, non-tender; bowel sounds normal; no masses,  no organomegaly Extremities: tenderness of rt hip incision sitye. No discharge or redness  Lab Results:    @labtest @ [facility] No results found for this basename: PHART, PCO2, PO2ART, TCO2, HCO3,  in the last 72 hours CULTURES Recent Results (from the past 240 hour(s))  CULTURE, BLOOD (ROUTINE X 2)     Status: None   Collection Time    05/22/12  9:42 PM      Result Value Range Status   Specimen Description BLOOD RIGHT ARM   Final   Special Requests BOTTLES DRAWN AEROBIC AND ANAEROBIC 6CC   Final   Culture NO GROWTH 3 DAYS   Final   Report Status PENDING   Incomplete  CULTURE, BLOOD (ROUTINE X 2)     Status: None   Collection Time    05/22/12  9:47 PM      Result Value Range Status   Specimen Description BLOOD RIGHT HAND   Final   Special Requests BOTTLES DRAWN AEROBIC AND ANAEROBIC 6CC   Final   Culture NO GROWTH 3 DAYS   Final   Report Status PENDING   Incomplete  MRSA PCR SCREENING     Status: None   Collection Time    05/23/12  6:20 PM      Result Value Range Status   MRSA by PCR NEGATIVE  NEGATIVE Final   Comment:            The GeneXpert MRSA Assay (FDA     approved for NASAL specimens     only), is one component of a     comprehensive MRSA  colonization     surveillance program. It is not     intended to diagnose MRSA     infection nor to guide or     monitor treatment for     MRSA infections.   Studies/Results: No results found.  Medications: I have reviewed the patient's current medications.  Assesment: 1. Infection of Rt hip prosthesis 2. Cellulitis of rt hip 3. Anemia 4. H/o psychosis 5. H/O IBs Active Problems:   * No active hospital problems. *    Plan: Continue Iv antibiotics We will follow culture result Orthopedic consult appreciated.    LOS: 3 days   FANTA,TESFAYE 05/25/2012, 8:42 AM

## 2012-05-26 MED ORDER — VANCOMYCIN HCL IN DEXTROSE 1-5 GM/200ML-% IV SOLN
INTRAVENOUS | Status: AC
  Filled 2012-05-26: qty 200

## 2012-05-26 NOTE — Progress Notes (Signed)
Subjective:   Patient continue to complain of rt hip pain. No fever or chills. Objective: Vital signs in last 24 hours: Temp:  [98.6 F (37 C)-99.2 F (37.3 C)] 98.6 F (37 C) (03/03 0522) Pulse Rate:  [73-77] 76 (03/03 0522) Resp:  [16-20] 20 (03/03 0522) BP: (71-99)/(33-73) 94/58 mmHg (03/03 0522) SpO2:  [95 %-97 %] 95 % (03/03 0522) Weight change:  Last BM Date: 05/25/12  Intake/Output from previous day: 03/02 0701 - 03/03 0700 In: 2427 [P.O.:777; I.V.:1250; IV Piggyback:400] Out: 2350 [Urine:2350]  PHYSICAL EXAM General appearance: alert and no distress Resp: clear to auscultation bilaterally Cardio: regular rate and rhythm, S1, S2 normal, no murmur, click, rub or gallop GI: soft, non-tender; bowel sounds normal; no masses,  no organomegaly Extremities: tenderness of rt hip incision sitye. No discharge or redness  Lab Results:    @labtest @ [facility] No results found for this basename: PHART, PCO2, PO2ART, TCO2, HCO3,  in the last 72 hours CULTURES Recent Results (from the past 240 hour(s))  CULTURE, BLOOD (ROUTINE X 2)     Status: None   Collection Time    05/22/12  9:42 PM      Result Value Range Status   Specimen Description BLOOD RIGHT ARM   Final   Special Requests BOTTLES DRAWN AEROBIC AND ANAEROBIC 6CC   Final   Culture NO GROWTH 3 DAYS   Final   Report Status PENDING   Incomplete  CULTURE, BLOOD (ROUTINE X 2)     Status: None   Collection Time    05/22/12  9:47 PM      Result Value Range Status   Specimen Description BLOOD RIGHT HAND   Final   Special Requests BOTTLES DRAWN AEROBIC AND ANAEROBIC 6CC   Final   Culture NO GROWTH 3 DAYS   Final   Report Status PENDING   Incomplete  MRSA PCR SCREENING     Status: None   Collection Time    05/23/12  6:20 PM      Result Value Range Status   MRSA by PCR NEGATIVE  NEGATIVE Final   Comment:            The GeneXpert MRSA Assay (FDA     approved for NASAL specimens     only), is one component of a   comprehensive MRSA colonization     surveillance program. It is not     intended to diagnose MRSA     infection nor to guide or     monitor treatment for     MRSA infections.   Studies/Results: No results found.  Medications: I have reviewed the patient's current medications.  Assesment: 1. Infection of Rt hip prosthesis 2. Cellulitis of rt hip 3. Anemia 4. H/o psychosis 5. H/O IBs Active Problems:   * No active hospital problems. *    Plan: Continue Iv antibiotics We will follow culture result Continue current treatment   LOS: 4 days   FANTA,TESFAYE 05/26/2012, 8:26 AM

## 2012-05-27 LAB — CULTURE, BLOOD (ROUTINE X 2)

## 2012-05-27 MED ORDER — VANCOMYCIN HCL IN DEXTROSE 1-5 GM/200ML-% IV SOLN: 1000.0000 mg | Freq: Two times a day (BID) | INTRAVENOUS | Status: DC

## 2012-05-27 NOTE — Progress Notes (Signed)
ANTICOAGULATION CONSULT NOTE  Pharmacy Consult for Lovenox Indication: VTE prophylaxis  Allergies  Allergen Reactions  . Diphenhydramine Other (See Comments)    Feels bad    Patient Measurements: Height: 5\' 7"  (170.2 cm) Weight: 125 lb 8 oz (56.926 kg) IBW/kg (Calculated) : 61.6  Vital Signs: Temp: 98.9 F (37.2 C) (03/04 0541) Temp src: Oral (03/04 0541) BP: 101/64 mmHg (03/04 0541) Pulse Rate: 76 (03/04 0541)  Labs:  Recent Labs  05/25/12 0439  CREATININE 0.59    Estimated Creatinine Clearance: 61.3 ml/min (by C-G formula based on Cr of 0.59).   Medical History: Past Medical History  Diagnosis Date  . IBS (irritable bowel syndrome)   . GERD (gastroesophageal reflux disease)   . Gout   . Chronic back pain   . Chronic right hip pain   . Chronic diarrhea   . Depression   . Anxiety and depression     Medications:  Scheduled:  . ALPRAZolam  0.5 mg Oral TID  . buPROPion  450 mg Oral q morning - 10a  . enoxaparin (LOVENOX) injection  40 mg Subcutaneous Q24H  . feeding supplement  30 mL Oral BID  . fentaNYL  25 mcg Transdermal Q72H  . fesoterodine  4 mg Oral Daily  . gabapentin  300 mg Oral TID  . lactose free nutrition  237 mL Oral BID  . mirtazapine  15 mg Oral QHS  . morphine  15 mg Oral BID  . OLANZapine  7.5 mg Oral QHS  . pantoprazole  40 mg Oral Daily  . simvastatin  20 mg Oral QHS  . vancomycin  1,000 mg Intravenous Q12H    Assessment: Patient has been on Lovenox for VTE px since admission.  Renal function has been stable.  No bleeding noted.   Goal of Therapy:  Monitor platelets by anticoagulation protocol: Yes   Plan:  Continue Lovenox 40mg  sq daily while admitted Pharmacy to sign off. Please re-consult as needed.   Elson Clan 05/27/2012,8:07 AM

## 2012-05-27 NOTE — Progress Notes (Signed)
Pt. Assisted to transport, packet included with pt. Items returned, IV removed.

## 2012-05-27 NOTE — Clinical Social Work Note (Signed)
Pt d/c today back to [facility]. Pt and facility aware and agreeable. CSW attempted several times since Friday to get in touch with pt's guardian Lauris Poag and left voicemails, which have not been returned. Debbie at [employer] notified of this and will continue to try to reach her. Pt to transport via Orlene Plum, LCSW [phone]

## 2012-05-27 NOTE — Clinical Social Work Note (Signed)
CSW received call from [place]. Agreeable to return today.  Derenda Fennel, [place] [phone]

## 2012-05-27 NOTE — Discharge Summary (Signed)
Physician Discharge Summary  Patient ID: [name] MRN: [id number] DOB/AGE: December 04, 1945 67 y.o. Primary Care Physician:FANTA,TESFAYE, MD Admit date: 05/22/2012 Discharge date: 05/27/2012    Discharge Diagnoses:  1. Infection of Rt hip prosthesis  2. Cellulitis of rt hip  3. Anemia  4. H/o psychosis  5. H/O IBs  Active Problems:   * No active hospital problems. *     Medication List    TAKE these medications       ALPRAZolam 0.5 MG tablet  Commonly known as:  XANAX  Take 1 tablet (0.5 mg total) by mouth 3 (three) times daily.     buPROPion 150 MG 24 hr tablet  Commonly known as:  WELLBUTRIN XL  Take 450 mg by mouth every morning.     dicyclomine 10 MG capsule  Commonly known as:  BENTYL  Take 1 capsule (10 mg total) by mouth 4 (four) times daily -  before meals and at bedtime.     fentaNYL 25 MCG/HR  Commonly known as:  DURAGESIC - dosed mcg/hr  Place 1 patch onto the skin every 3 (three) days. Applied for right hip pain for 72 hours. Remove and discard used patches     fesoterodine 4 MG Tb24  Commonly known as:  TOVIAZ  Take 1 tablet (4 mg total) by mouth daily.     gabapentin 300 MG capsule  Commonly known as:  NEURONTIN  Take 1 capsule (300 mg total) by mouth 3 (three) times daily.     HYDROcodone-acetaminophen 5-325 MG per tablet  Commonly known as:  NORCO  Take 1 tablet by mouth every 4 (four) hours as needed for pain.     HYDROGEL Gel  Apply 1 application topically every other day. Applied to the left hip for wound cleansing with normal saline or wound cleanser     loperamide 2 MG capsule  Commonly known as:  IMODIUM  Take 2-4 mg by mouth 4 (four) times daily as needed. For diarrhea, take 2 tablets (4mg ) initially then take 2 mg after each loose stool     mirtazapine 15 MG tablet  Commonly known as:  REMERON  Take 15 mg by mouth every evening.     morphine 15 MG 12 hr tablet  Commonly known as:  MS CONTIN  Take 1 tablet (15 mg total) by mouth  2 (two) times daily.     OLANZapine 7.5 MG tablet  Commonly known as:  ZYPREXA  Take 7.5 mg by mouth at bedtime.     omeprazole 20 MG capsule  Commonly known as:  PRILOSEC  Take 20 mg by mouth daily.     Pro-Stat AWC Liqd  Take 30 mLs by mouth 2 (two) times daily.     Resource 2.0 Liqd  Take 90 mLs by mouth 2 (two) times daily.     simvastatin 20 MG tablet  Commonly known as:  ZOCOR  Take 20 mg by mouth at bedtime.     vancomycin 1 GM/200ML Soln  Commonly known as:  VANCOCIN  Inject 200 mLs (1,000 mg total) into the vein every 12 (twelve) hours.        Discharged Condition: improved    Consults: orthopedics  Significant Diagnostic Studies: Ct Hip Right W Contrast  05/22/2012  *RADIOLOGY REPORT*  Clinical Data: Hip pain.  Possible infection, recent surgery.  CT OF THE RIGHT HIP WITH CONTRAST  Technique:  Multidetector CT imaging was performed following the standard protocol during bolus administration of intravenous contrast.  Contrast: OMNIPAQUE IOHEXOL 300 MG/ML  SOLN  Comparison: CT right hip 01/13/2012.  CT pelvis 01/22/2012.  Findings: Redemonstrated is bipolar right hip replacement. Extensive streak artifact is noted as a result of the metallic components.  There is subsidence of the bipolar prosthesis into the acetabulum again noted with regional sclerosis and cephalad migration.  No loosening of the femoral component is seen.  There is increasing heterotopic ossification surrounding the proximal aspect of the femoral component.  Again noted is   cellulitic change and subcutaneous stranding in the region of the greater trochanter.  The soft tissue inflammatory component appears increased from priors suggesting cellulitis and myositis.  There is no drainable abscess identified.  Small bubbles of air are seen in the soft tissues lateral to the greater trochanter (images 53 and 54, series 2), which were not clearly noted previously.   Concern is raised for infection.  No  definite changes of osteomyelitis.  IMPRESSION: Postoperative changes as described with subsidence of the bipolar prosthesis.  Increasingly prominent changes of cellulitis, now with small bubbles of air laterally.  There is no drainable fluid collection, but concern is raised for infection of the soft tissues along the course of the incision.   Original Report Authenticated By: Davonna Belling, M.D.     Lab Results: Basic Metabolic Panel:  Recent Labs  78/29/56 0439  NA 142  K 3.8  CL 106  CO2 27  GLUCOSE 94  BUN 8  CREATININE 0.59  CALCIUM 8.6   Liver Function Tests: No results found for this basename: AST, ALT, ALKPHOS, BILITOT, PROT, ALBUMIN,  in the last 72 hours   CBC: No results found for this basename: WBC, NEUTROABS, HGB, HCT, MCV, PLT,  in the last 72 hours  Recent Results (from the past 240 hour(s))  CULTURE, BLOOD (ROUTINE X 2)     Status: None   Collection Time    05/22/12  9:42 PM      Result Value Range Status   Specimen Description BLOOD RIGHT ARM   Final   Special Requests BOTTLES DRAWN AEROBIC AND ANAEROBIC 6CC   Final   Culture NO GROWTH 4 DAYS   Final   Report Status PENDING   Incomplete  CULTURE, BLOOD (ROUTINE X 2)     Status: None   Collection Time    05/22/12  9:47 PM      Result Value Range Status   Specimen Description BLOOD RIGHT HAND   Final   Special Requests BOTTLES DRAWN AEROBIC AND ANAEROBIC 6CC   Final   Culture NO GROWTH 4 DAYS   Final   Report Status PENDING   Incomplete  MRSA PCR SCREENING     Status: None   Collection Time    05/23/12  6:20 PM      Result Value Range Status   MRSA by PCR NEGATIVE  NEGATIVE Final   Comment:            The GeneXpert MRSA Assay (FDA     approved for NASAL specimens     only), is one component of a     comprehensive MRSA colonization     surveillance program. It is not     intended to diagnose MRSA     infection nor to guide or     monitor treatment for     MRSA infections.     Hospital Course:   This is a 67 years old female patient with history of multiple medical illnesses who  was admitted from [facility] nursing home due to cellulitis and rt hip prosthesis infection. She was seen by her orthopest who suggested to discharge patient on IV vancomycin for 6 weeks.  Discharge Exam: Blood pressure 101/64, pulse 76, temperature 98.9 F (37.2 C), temperature source Oral, resp. rate 18, height 5\' 7"  (1.702 m), weight 56.926 kg (125 lb 8 oz), SpO2 94.00%.   Disposition:  Nursing home.      Signed: FANTA,TESFAYE   05/27/2012, 8:44 AM

## 2012-06-05 ENCOUNTER — Telehealth: Payer: Self-pay | Admitting: Orthopedic Surgery

## 2012-06-05 NOTE — Telephone Encounter (Signed)
Called back to [facility], relayed per Dr. Romeo Apple + regarding previous medical report not received.   Forwarding note to nurse also.

## 2012-06-05 NOTE — Telephone Encounter (Signed)
Dr. Romeo Apple indicates follow up at a tertiary facility, and that notes/reports have not been received by [facility] as of yet.   CALL [facility] JUST LIKE THEY CALLED [place]   I HAVE NO FURTHER ADVICE

## 2012-06-05 NOTE — Telephone Encounter (Signed)
Call received from Patsy at [facility] facility, requesting hospital follow up appointment.  Hospital discharge by Dr. Felecia Shelling; orthopedic consult note by Dr. Romeo Apple indicates follow up at a tertiary facility, and that notes/reports have not been received by [facility] as of yet.  Please advise regarding scheduling of appointment.  Ph# at [facility]: [phone] - Patsy said we may ask for her or [place] Ana.

## 2012-06-12 ENCOUNTER — Encounter: Payer: Self-pay | Admitting: Orthopedic Surgery

## 2012-06-12 ENCOUNTER — Telehealth: Payer: Self-pay | Admitting: *Deleted

## 2012-06-12 NOTE — Telephone Encounter (Signed)
Faxed [facility] to request office notes from Dr. Marisue Humble

## 2012-06-13 NOTE — Telephone Encounter (Signed)
Per call received 06/12/12 from [place] from [place], ph# [phone] as follow-up; relayed the following information: Re-iterated that Dr. Romeo Apple is awaiting reports from [facility], and we have no further notes to schedule patient here at this time.  We have contacted [facility] for report - faxed request (See separate note) *  and have received consult report from Dr. Marisue Humble at [facility].  Forwarding to nurse and to Dr. Romeo Apple for review.

## 2012-06-13 NOTE — Telephone Encounter (Signed)
Forwarding to Dr. Harrison 

## 2012-07-20 ENCOUNTER — Inpatient Hospital Stay (HOSPITAL_COMMUNITY)
Admission: EM | Admit: 2012-07-20 | Discharge: 2012-07-24 | DRG: 560 | Disposition: A | Discharge status: Skilled Nursing Facility | Payer: Medicare Other | Attending: Internal Medicine | Admitting: Internal Medicine

## 2012-07-20 ENCOUNTER — Encounter (HOSPITAL_COMMUNITY): Payer: Self-pay

## 2012-07-20 ENCOUNTER — Emergency Department (HOSPITAL_COMMUNITY): Payer: Medicare Other

## 2012-07-20 DIAGNOSIS — Y831 Surgical operation with implant of artificial internal device as the cause of abnormal reaction of the patient, or of later complication, without mention of misadventure at the time of the procedure: Secondary | ICD-10-CM | POA: Diagnosis present

## 2012-07-20 DIAGNOSIS — T8450XS Infection and inflammatory reaction due to unspecified internal joint prosthesis, sequela: Secondary | ICD-10-CM

## 2012-07-20 DIAGNOSIS — T8489XA Other specified complication of internal orthopedic prosthetic devices, implants and grafts, initial encounter: Principal | ICD-10-CM | POA: Diagnosis present

## 2012-07-20 DIAGNOSIS — Z79899 Other long term (current) drug therapy: Secondary | ICD-10-CM

## 2012-07-20 DIAGNOSIS — F329 Major depressive disorder, single episode, unspecified: Secondary | ICD-10-CM | POA: Diagnosis present

## 2012-07-20 DIAGNOSIS — F3289 Other specified depressive episodes: Secondary | ICD-10-CM | POA: Diagnosis present

## 2012-07-20 DIAGNOSIS — K219 Gastro-esophageal reflux disease without esophagitis: Secondary | ICD-10-CM | POA: Diagnosis present

## 2012-07-20 DIAGNOSIS — R7881 Bacteremia: Secondary | ICD-10-CM | POA: Diagnosis present

## 2012-07-20 DIAGNOSIS — Z96649 Presence of unspecified artificial hip joint: Secondary | ICD-10-CM

## 2012-07-20 DIAGNOSIS — L03115 Cellulitis of right lower limb: Secondary | ICD-10-CM

## 2012-07-20 DIAGNOSIS — Z87891 Personal history of nicotine dependence: Secondary | ICD-10-CM

## 2012-07-20 DIAGNOSIS — L03119 Cellulitis of unspecified part of limb: Secondary | ICD-10-CM | POA: Diagnosis present

## 2012-07-20 DIAGNOSIS — M549 Dorsalgia, unspecified: Secondary | ICD-10-CM | POA: Diagnosis present

## 2012-07-20 DIAGNOSIS — L0390 Cellulitis, unspecified: Secondary | ICD-10-CM

## 2012-07-20 DIAGNOSIS — G8929 Other chronic pain: Secondary | ICD-10-CM | POA: Diagnosis present

## 2012-07-20 DIAGNOSIS — B9689 Other specified bacterial agents as the cause of diseases classified elsewhere: Secondary | ICD-10-CM | POA: Diagnosis present

## 2012-07-20 DIAGNOSIS — F411 Generalized anxiety disorder: Secondary | ICD-10-CM | POA: Diagnosis present

## 2012-07-20 DIAGNOSIS — K589 Irritable bowel syndrome without diarrhea: Secondary | ICD-10-CM | POA: Diagnosis present

## 2012-07-20 DIAGNOSIS — M86659 Other chronic osteomyelitis, unspecified thigh: Secondary | ICD-10-CM | POA: Diagnosis present

## 2012-07-20 DIAGNOSIS — L02419 Cutaneous abscess of limb, unspecified: Secondary | ICD-10-CM | POA: Diagnosis present

## 2012-07-20 DIAGNOSIS — D649 Anemia, unspecified: Secondary | ICD-10-CM | POA: Diagnosis present

## 2012-07-20 HISTORY — DX: Anemia, unspecified: D64.9

## 2012-07-20 HISTORY — DX: Cellulitis, unspecified: L03.90

## 2012-07-20 LAB — CBC WITH DIFFERENTIAL/PLATELET
Hemoglobin: 9.5 g/dL — ABNORMAL LOW (ref 12.0–15.0)
Lymphocytes Relative: 17 % (ref 12–46)
Lymphs Abs: 1.3 10*3/uL (ref 0.7–4.0)
Monocytes Relative: 9 % (ref 3–12)
Neutrophils Relative %: 73 % (ref 43–77)
Platelets: 369 10*3/uL (ref 150–400)
RBC: 3.67 MIL/uL — ABNORMAL LOW (ref 3.87–5.11)
WBC: 7.6 10*3/uL (ref 4.0–10.5)

## 2012-07-20 LAB — BASIC METABOLIC PANEL
BUN: 20 mg/dL (ref 6–23)
CO2: 28 mEq/L (ref 19–32)
Chloride: 99 mEq/L (ref 96–112)
Glucose, Bld: 107 mg/dL — ABNORMAL HIGH (ref 70–99)
Potassium: 4.4 mEq/L (ref 3.5–5.1)
Sodium: 136 mEq/L (ref 135–145)

## 2012-07-20 MED ORDER — LOPERAMIDE HCL 2 MG PO CAPS: 2.0000 mg | ORAL_CAPSULE | Freq: Four times a day (QID) | ORAL | Status: DC | PRN

## 2012-07-20 MED ORDER — VANCOMYCIN HCL IN DEXTROSE 1-5 GM/200ML-% IV SOLN: 1000.0000 mg | Freq: Once | INTRAVENOUS | Status: DC

## 2012-07-20 MED ORDER — FESOTERODINE FUMARATE ER 4 MG PO TB24
4.0000 mg | ORAL_TABLET | Freq: Every day | ORAL | Status: DC
  Filled 2012-07-20: qty 1

## 2012-07-20 MED ORDER — ALPRAZOLAM 0.5 MG PO TABS
0.5000 mg | ORAL_TABLET | Freq: Two times a day (BID) | ORAL | Status: DC | PRN
  Administered 2012-07-23: 0.5 mg via ORAL
  Filled 2012-07-20: qty 1

## 2012-07-20 MED ORDER — GABAPENTIN 300 MG PO CAPS
300.0000 mg | ORAL_CAPSULE | Freq: Three times a day (TID) | ORAL | Status: DC
  Administered 2012-07-20 – 2012-07-24 (×12): 300 mg via ORAL
  Filled 2012-07-20 (×17): qty 1

## 2012-07-20 MED ORDER — VANCOMYCIN HCL IN DEXTROSE 1-5 GM/200ML-% IV SOLN
1000.0000 mg | Freq: Two times a day (BID) | INTRAVENOUS | Status: DC
  Administered 2012-07-20 – 2012-07-24 (×8): 1000 mg via INTRAVENOUS
  Filled 2012-07-20 (×10): qty 200

## 2012-07-20 MED ORDER — MORPHINE SULFATE ER 15 MG PO TBCR
15.0000 mg | EXTENDED_RELEASE_TABLET | Freq: Two times a day (BID) | ORAL | Status: DC
  Administered 2012-07-20 – 2012-07-24 (×8): 15 mg via ORAL
  Filled 2012-07-20 (×8): qty 1

## 2012-07-20 MED ORDER — BIOTENE DRY MOUTH MT LIQD
15.0000 mL | Freq: Two times a day (BID) | OROMUCOSAL | Status: DC
  Administered 2012-07-20 – 2012-07-24 (×8): 15 mL via OROMUCOSAL

## 2012-07-20 MED ORDER — ENOXAPARIN SODIUM 40 MG/0.4ML ~~LOC~~ SOLN: 40.0000 mg | SUBCUTANEOUS | Status: DC

## 2012-07-20 MED ORDER — VANCOMYCIN HCL IN DEXTROSE 1-5 GM/200ML-% IV SOLN
INTRAVENOUS | Status: AC
  Filled 2012-07-20: qty 200

## 2012-07-20 MED ORDER — ENSURE COMPLETE PO LIQD
237.0000 mL | Freq: Two times a day (BID) | ORAL | Status: DC
  Administered 2012-07-21 – 2012-07-24 (×7): 237 mL via ORAL

## 2012-07-20 MED ORDER — FENTANYL 25 MCG/HR TD PT72
25.0000 ug | MEDICATED_PATCH | TRANSDERMAL | Status: DC
  Administered 2012-07-20 – 2012-07-23 (×2): 25 ug via TRANSDERMAL
  Filled 2012-07-20 (×2): qty 1

## 2012-07-20 MED ORDER — FENTANYL 25 MCG/HR TD PT72: 25.0000 ug | MEDICATED_PATCH | TRANSDERMAL | Status: DC

## 2012-07-20 MED ORDER — OLANZAPINE 5 MG PO TABS
10.0000 mg | ORAL_TABLET | Freq: Every day | ORAL | Status: DC
  Administered 2012-07-20 – 2012-07-23 (×4): 10 mg via ORAL
  Filled 2012-07-20 (×3): qty 2
  Filled 2012-07-20: qty 1
  Filled 2012-07-20: qty 2

## 2012-07-20 MED ORDER — ENOXAPARIN SODIUM 40 MG/0.4ML ~~LOC~~ SOLN
40.0000 mg | SUBCUTANEOUS | Status: DC
  Administered 2012-07-20 – 2012-07-23 (×4): 40 mg via SUBCUTANEOUS
  Filled 2012-07-20 (×5): qty 0.4

## 2012-07-20 MED ORDER — ONDANSETRON HCL 4 MG/2ML IJ SOLN: 4.0000 mg | Freq: Four times a day (QID) | INTRAMUSCULAR | Status: DC | PRN

## 2012-07-20 MED ORDER — ALUM & MAG HYDROXIDE-SIMETH 200-200-20 MG/5ML PO SUSP: 30.0000 mL | Freq: Four times a day (QID) | ORAL | Status: DC | PRN

## 2012-07-20 MED ORDER — ACETAMINOPHEN 650 MG RE SUPP: 650.0000 mg | Freq: Four times a day (QID) | RECTAL | Status: DC | PRN

## 2012-07-20 MED ORDER — GABAPENTIN 300 MG PO CAPS
300.0000 mg | ORAL_CAPSULE | Freq: Three times a day (TID) | ORAL | Status: DC
  Filled 2012-07-20 (×2): qty 1

## 2012-07-20 MED ORDER — PANTOPRAZOLE SODIUM 40 MG PO TBEC
40.0000 mg | DELAYED_RELEASE_TABLET | Freq: Every day | ORAL | Status: DC
  Administered 2012-07-20 – 2012-07-24 (×5): 40 mg via ORAL
  Filled 2012-07-20 (×5): qty 1

## 2012-07-20 MED ORDER — SIMVASTATIN 20 MG PO TABS
20.0000 mg | ORAL_TABLET | Freq: Every day | ORAL | Status: DC
  Administered 2012-07-20 – 2012-07-23 (×4): 20 mg via ORAL
  Filled 2012-07-20 (×5): qty 1

## 2012-07-20 MED ORDER — PANTOPRAZOLE SODIUM 40 MG PO TBEC: 40.0000 mg | DELAYED_RELEASE_TABLET | Freq: Every day | ORAL | Status: DC

## 2012-07-20 MED ORDER — MIRTAZAPINE 15 MG PO TABS: 15.0000 mg | ORAL_TABLET | Freq: Every evening | ORAL | Status: DC

## 2012-07-20 MED ORDER — CITALOPRAM HYDROBROMIDE 10 MG PO TABS
10.0000 mg | ORAL_TABLET | Freq: Every day | ORAL | Status: DC
  Filled 2012-07-20: qty 1

## 2012-07-20 MED ORDER — ACETAMINOPHEN 325 MG PO TABS: 650.0000 mg | ORAL_TABLET | Freq: Four times a day (QID) | ORAL | Status: DC | PRN

## 2012-07-20 MED ORDER — FESOTERODINE FUMARATE ER 4 MG PO TB24
4.0000 mg | ORAL_TABLET | Freq: Every day | ORAL | Status: DC
  Administered 2012-07-20 – 2012-07-24 (×5): 4 mg via ORAL
  Filled 2012-07-20 (×6): qty 1

## 2012-07-20 MED ORDER — VANCOMYCIN HCL IN DEXTROSE 1-5 GM/200ML-% IV SOLN: 1000.0000 mg | Freq: Two times a day (BID) | INTRAVENOUS | Status: DC

## 2012-07-20 MED ORDER — BUPROPION HCL ER (XL) 300 MG PO TB24
450.0000 mg | ORAL_TABLET | Freq: Every morning | ORAL | Status: DC
  Administered 2012-07-21 – 2012-07-24 (×4): 450 mg via ORAL
  Filled 2012-07-20 (×5): qty 1

## 2012-07-20 MED ORDER — ONDANSETRON HCL 4 MG PO TABS: 4.0000 mg | ORAL_TABLET | Freq: Four times a day (QID) | ORAL | Status: DC | PRN

## 2012-07-20 MED ORDER — CITALOPRAM HYDROBROMIDE 20 MG PO TABS
10.0000 mg | ORAL_TABLET | Freq: Every day | ORAL | Status: DC
  Administered 2012-07-20 – 2012-07-24 (×5): 10 mg via ORAL
  Filled 2012-07-20 (×5): qty 1
  Filled 2012-07-20: qty 2
  Filled 2012-07-20 (×2): qty 1

## 2012-07-20 MED ORDER — MIRTAZAPINE 30 MG PO TABS
15.0000 mg | ORAL_TABLET | Freq: Every evening | ORAL | Status: DC
  Administered 2012-07-20 – 2012-07-24 (×5): 15 mg via ORAL
  Filled 2012-07-20 (×7): qty 1

## 2012-07-20 MED ORDER — HYDROCODONE-ACETAMINOPHEN 5-325 MG PO TABS
1.0000 | ORAL_TABLET | ORAL | Status: DC | PRN
  Administered 2012-07-20 – 2012-07-24 (×10): 1 via ORAL
  Filled 2012-07-20 (×10): qty 1

## 2012-07-20 MED ORDER — DICYCLOMINE HCL 10 MG PO CAPS
10.0000 mg | ORAL_CAPSULE | Freq: Three times a day (TID) | ORAL | Status: DC
  Administered 2012-07-20 – 2012-07-24 (×16): 10 mg via ORAL
  Filled 2012-07-20 (×16): qty 1

## 2012-07-20 NOTE — ED Provider Notes (Signed)
History    This chart was scribed for Gilda Crease, MD, by Frederik Pear, ED scribe. The patient was seen in room AP[facility]2/AP[facility]2 and the patient's care was started at 1547.    CSN: [id number]  Arrival date & time 07/20/12  1533   First MD Initiated Contact with Patient 07/20/12 1547      Chief Complaint  Patient presents with  . Abnormal Lab    (Consider location/radiation/quality/duration/timing/severity/associated sxs/prior treatment) The history is provided by the patient, the nursing home and medical records. No language interpreter was used.    [name] is a 67 y.o. female brought in by ambulance from [facility] home per Dr. Ouida Sills after her most recent blood cultures exhibited gram positive cocci clusters. 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 She was discharges with a course of Vancomycin, but staff reports that she completed the course a few weeks ago. [facility] staff states that the recent lab work was performed because the patient, who is stable while ambulating at baseline has been experiencing falls, including two with in the past 2 days. They also report increased erythema, warmth, and tenderness to her right hip and that she began febrile today. She denies any CP, SOB, or increased frequency. In the ED, her temperature is 99.8.   Past Medical History  Diagnosis Date  . IBS (irritable bowel syndrome)   . GERD (gastroesophageal reflux disease)   . Gout   . Chronic back pain   . Chronic right hip pain   . Chronic diarrhea   . Depression   . Anxiety and depression     Past Surgical History  Procedure Laterality Date  . Hip arthroplasty  05/01/2011    Procedure: right ARTHROPLASTY BIPOLAR HIP;  Surgeon: Fuller Canada, MD;  Location: [facility];  Service: Orthopedics;  Laterality: Right;  . Incision and drainage of wound  05/24/2011     Procedure: IRRIGATION AND DEBRIDEMENT WOUND;  Surgeon: Fuller Canada, MD;  Location: [facility];  Service: Orthopedics;  Laterality: Right;  . Incision and drainage hip  02/08/2012    Procedure: IRRIGATION AND DEBRIDEMENT HIP;  Surgeon: Vickki Hearing, MD;  Location: [facility];  Service: Orthopedics;  Laterality: Right;    No family history on file.  History  Substance Use Topics  . Smoking status: Never Smoker   . Smokeless tobacco: Not on file  . Alcohol Use: No    OB History   Grav Para Term Preterm Abortions TAB SAB Ect Mult Living                  Review of Systems  Constitutional: Positive for fever.  Respiratory: Negative for shortness of breath.   Cardiovascular: Negative for chest pain.  Genitourinary: Negative for frequency and decreased urine volume.  Musculoskeletal: Positive for arthralgias (right hip).  All other systems reviewed and are negative.   Allergies  Diphenhydramine  Home Medications   Current Outpatient Rx  Name  Route  Sig  Dispense  Refill  . ALPRAZolam (XANAX) 0.5 MG tablet   Oral   Take 1 tablet (0.5 mg total) by mouth 3 (three) times daily.   30 tablet   2   . Amino Acids-Protein Hydrolys (PRO-STAT AWC) LIQD   Oral   Take 30 mLs by mouth 2 (two) times daily.         Marland Kitchen buPROPion (  WELLBUTRIN XL) 150 MG 24 hr tablet   Oral   Take 450 mg by mouth every morning.          Joycelyn Rua Gel Base (HYDROGEL) GEL   Topical   Apply 1 application topically every other day. Applied to the left hip for wound cleansing with normal saline or wound cleanser         . dicyclomine (BENTYL) 10 MG capsule   Oral   Take 1 capsule (10 mg total) by mouth 4 (four) times daily -  before meals and at bedtime.   40 capsule   0   . fentaNYL (DURAGESIC - DOSED MCG/HR) 25 MCG/HR   Transdermal   Place 1 patch onto the skin every 3 (three) days. Applied for right hip pain for 72 hours. Remove and discard used patches         . fesoterodine (TOVIAZ) 4  MG TB24   Oral   Take 1 tablet (4 mg total) by mouth daily.   30 tablet   0   . gabapentin (NEURONTIN) 300 MG capsule   Oral   Take 1 capsule (300 mg total) by mouth 3 (three) times daily.   90 capsule   5   . HYDROcodone-acetaminophen (NORCO) 5-325 MG per tablet   Oral   Take 1 tablet by mouth every 4 (four) hours as needed for pain.   60 tablet   5   . loperamide (IMODIUM) 2 MG capsule   Oral   Take 2-4 mg by mouth 4 (four) times daily as needed. For diarrhea, take 2 tablets (4mg ) initially then take 2 mg after each loose stool         . mirtazapine (REMERON) 15 MG tablet   Oral   Take 15 mg by mouth every evening.         Marland Kitchen morphine (MS CONTIN) 15 MG 12 hr tablet   Oral   Take 1 tablet (15 mg total) by mouth 2 (two) times daily.   60 tablet   0   . Nutritional Supplements (RESOURCE 2.0) LIQD   Oral   Take 90 mLs by mouth 2 (two) times daily.         Marland Kitchen omeprazole (PRILOSEC) 20 MG capsule   Oral   Take 20 mg by mouth daily.         . simvastatin (ZOCOR) 20 MG tablet   Oral   Take 20 mg by mouth at bedtime.            BP 85/43  Pulse 87  Temp(Src) 99.8 F (37.7 C) (Oral)  Resp 16  Ht 5\' 7"  (1.702 m)  Wt 125 lb (56.7 kg)  BMI 19.57 kg/m2  SpO2 95%  Physical Exam  Constitutional: She is oriented to person, place, and time. She appears well-developed and well-nourished. No distress.  HENT:  Head: Normocephalic and atraumatic.  Right Ear: Hearing normal.  Nose: Nose normal.  Mouth/Throat: Oropharynx is clear and moist and mucous membranes are normal.  Eyes: Conjunctivae and EOM are normal. Pupils are equal, round, and reactive to light.  Neck: Normal range of motion. Neck supple.  Cardiovascular: Normal rate, regular rhythm, S1 normal and S2 normal.  Exam reveals no gallop and no friction rub.   No murmur heard. Pulmonary/Chest: Effort normal and breath sounds normal. No respiratory distress. She exhibits no tenderness.  Abdominal: Soft.  Normal appearance and bowel sounds are normal. There is no hepatosplenomegaly. There is no tenderness. There is  no rebound, no guarding, no tenderness at McBurney's point and negative Murphy's sign. No hernia.  Musculoskeletal: Normal range of motion.       Right hip: She exhibits tenderness. She exhibits no deformity.  Neurological: She is alert and oriented to person, place, and time. She has normal strength. No cranial nerve deficit or sensory deficit. Coordination normal. GCS eye subscore is 4. GCS verbal subscore is 5. GCS motor subscore is 6.  Skin: Skin is warm, dry and intact. No rash noted. No cyanosis.     Psychiatric: She has a normal mood and affect. Her speech is normal and behavior is normal. Thought content normal.    ED Course  Procedures (including critical care time)  DIAGNOSTIC STUDIES: Oxygen Saturation is 95% on room air, adequate by my interpretation.    COORDINATION OF CARE:  16:05- Discussed planned course of treatment with the patient, including blood work, who is agreeable at this time.   Labs Reviewed  CBC WITH DIFFERENTIAL - Abnormal; Notable for the following:    RBC 3.67 (*)    Hemoglobin 9.5 (*)    HCT 29.9 (*)    MCH 25.9 (*)    RDW 17.5 (*)    All other components within normal limits  BASIC METABOLIC PANEL - Abnormal; Notable for the following:    Glucose, Bld 107 (*)    All other components within normal limits  CULTURE, BLOOD (ROUTINE X 2)  CULTURE, BLOOD (ROUTINE X 2)   No results found.   Diagnosis: 1. Right Hip Cellulitis 2. Hip Prosthesis Infection    MDM  Patient sent to the ER for evaluation of positive Gram stain and blood culture. Patient has a previous history of right hip area cellulitis and prosthetic hip infection. She was on prolonged outpatient vancomycin, stopped 2 weeks ago. In the last few days has had increasing pain in the area of the hip, generalized weakness with falls. She had a fever earlier. The area where the  infection was previously has not become red and swollen again. Because of these symptoms, blood work was performed in results today revealed gram-positive cocci in the blood cultures. Patient return to the ER for evaluation.  Evaluation reveals a nonseptic looking woman with obvious repeat infection of the soft tissues around the right hip prosthesis. There does not appear to be any drainable fluid collections. She will require repeat antibiotic therapy and possible surgical intervention.  Status with Dr. Ouida Sills, covering for Dr. Felecia Shelling. He will see the patient in the ER and determine if the patient is a candidate for continued outpatient Vancomycin therapy or if she requires hospitalization.  I personally performed the services described in this documentation, which was scribed in my presence. The recorded information has been reviewed and is accurate.        Gilda Crease, MD 07/20/12 [phone]

## 2012-07-20 NOTE — ED Notes (Signed)
Dr. Ouida Sills here to eval for admission

## 2012-07-20 NOTE — ED Notes (Signed)
Pt via EMS from [facility] sent by [facility] due to abnormal lab value. Blood cultures showed gram positive cocci clusters.

## 2012-07-20 NOTE — ED Notes (Signed)
MD at bedside. 

## 2012-07-20 NOTE — ED Notes (Signed)
Pt here from [facility] for eval of

## 2012-07-21 MED ORDER — ALTEPLASE 2 MG IJ SOLR
2.0000 mg | Freq: Once | INTRAMUSCULAR | Status: AC
  Administered 2012-07-21: 2 mg
  Filled 2012-07-21: qty 2

## 2012-07-21 MED ORDER — ALTEPLASE 100 MG IV SOLR: 2.0000 mg | Freq: Once | INTRAVENOUS | Status: DC

## 2012-07-21 MED ORDER — PRO-STAT SUGAR FREE PO LIQD
30.0000 mL | Freq: Three times a day (TID) | ORAL | Status: DC
  Administered 2012-07-21 – 2012-07-23 (×6): 30 mL via ORAL
  Filled 2012-07-21 (×6): qty 30

## 2012-07-21 NOTE — H&P (Signed)
NAMESHANITRA, PHILLIPPI               ACCOUNT NO.:  [id number]  MEDICAL RECORD NO.:  [id number]  LOCATION:  A327                          FACILITY:  [facility]  PHYSICIAN:  Kingsley Callander. Ouida Sills, MD       DATE OF BIRTH:  23-Aug-1945  DATE OF ADMISSION:  07/20/2012 DATE OF DISCHARGE:  04/27/2014LH                             HISTORY & PHYSICAL   CHIEF COMPLAINT:  Right hip pain.  HISTORY OF PRESENT ILLNESS:  This patient is a 67 year old, white female, patient at [facility] and patient of Dr. Felecia Shelling, who presented to the emergency room , after I was notified that she had positive blood cultures with gram-positive cocci in clusters.  She had had a recent fever.  She had developed redness in her right hip area.  She previously had a right hip replacement.  She has had previous infections in this area.  She most recently received intravenous vancomycin from March 31 to April 16.  She noticed increasing pain in this area.  She is minimally mobile and is not able to walk independently.  PAST MEDICAL HISTORY: 1. Right hip replacement with previous staph infection. 2. IBS. 3. GERD. 4. Gout. 5. Chronic back pain. 6. Depression and anxiety.  MEDICATIONS:  Alprazolam 0.5 mg q.12 hours p.r.n., Pro-Stat 30 mL b.i.d., bupropion XL 450 mg daily, Celexa 10 mg daily, Bentyl 10 mg q.i.d., fentanyl patch 25 mcg every 3 days, Toviaz 4 mg daily, gabapentin 300 mg t.i.d., Norco 5/325 q.4 p.r.n., Imodium 2 mg 1-2 q.i.d. p.r.n., mirtazapine 15 mg at bedtime, MS Contin 15 mg b.i.d., __________ 90 mL b.i.d., Zyprexa 10 mg at bedtime, omeprazole 20 mg daily, simvastatin 20 mg daily.  ALLERGIES:  Diphenhydramine.  SOCIAL HISTORY:  She does not use tobacco, alcohol, or recreational drugs.  FAMILY HISTORY:  Unknown.  REVIEW OF SYSTEMS:  Noncontributory.  PHYSICAL EXAMINATION:  VITAL SIGNS Temperature 99.8, pulse 87, respirations 16, blood pressure 109/64, oxygen saturation 96%.  GENERAL Alert and in no  distress.  HEENT No scleral icterus.  Oropharynx appears edentulous.  NECK Supple with no JVD or thyromegaly.  LUNGS Clear.  HEART Regular with no murmurs.  ABDOMEN Soft and nontender with no palpable organomegaly.  EXTREMITIES She has redness at the right hip, but no drainage or ulceration at the site of her hip wound.  Distal pulses intact.  No clubbing or edema.  NEURO No focal weakness.  LYMPH NODES No cervical or supraclavicular enlargement.  LABORATORY DATA:  White count 7.6, hemoglobin 9.5, platelets 369,000. Sodium 136, potassium 4.4, bicarb 28, BUN 20, creatinine 0.6, glucose 107, calcium 9.3.  X-ray of the right hip reveals no acute osseous abnormality.  There are destructive changes with superior migration of the prosthetic femoral head of the right bipolar hip hemiarthroplasty and there is a large amount of heterotopic bone.  This is possibly associated with chronic infection or particle disease.  This was compared to a prior CT of 227.  IMPRESSION/PLAN: 1. Recurrent right hip infection.  She will require hospitalization     and treatment with IV vancomycin and orthopedic consultation will     be obtained. 2. Normocytic anemia. 3. Depression and anxiety. 4. Chronic pain.  5. Gastroesophageal reflux disease. 6. History of gout. 7. Irritable bowel syndrome.     Kingsley Callander. Ouida Sills, MD     ROF/MEDQ  D:  07/20/2012  T:  07/21/2012  Job:  [id number]

## 2012-07-21 NOTE — Progress Notes (Signed)
Patient ID: [name], female   DOB: [birth year], 67 y.o.   MRN: [id number]  Consult has been requested by Dr. Felecia Shelling  The patient is currently being followed at [facility] for complications related to a right hip fracture treated with bipolar prosthesis complicated by chronic infection   Dr. Marisue Humble at [facility] has evaluated the patient previously and will need to be consult it on her disposition.  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

## 2012-07-21 NOTE — Clinical Social Work Psychosocial (Signed)
Clinical Social Work Department BRIEF PSYCHOSOCIAL ASSESSMENT 07/21/2012  Patient:  [name], [name]     Account Number:  [id number]     Admit date:  07/20/2012  Clinical Social Worker:  Nancie Neas  Date/Time:  07/21/2012 09:30 AM  Referred by:  Physician  Date Referred:  07/21/2012 Referred for  SNF Placement   Other Referral:   Interview type:  Patient Other interview type:   and guardian- Amelia    PSYCHOSOCIAL DATA Living Status:  FACILITY Admitted from facility:  [facility] Level of care:  Skilled Nursing Facility Primary support name:  Lauris Poag Primary support relationship to patient:  NONE Degree of support available:   guardian    CURRENT CONCERNS Current Concerns  Post-Acute Placement   Other Concerns:    SOCIAL WORK ASSESSMENT / PLAN CSW met with pt at bedside. Pt alert and oriented to person and place. She is well known to CSW from previous admissions. Pt has had a guardian for several years, [name] from [facility]. Pt has been resident at [facility] since November 2013 and will be long term. She is generally anxious about her d/c plan as she is afraid she will return to ALF, but is okay with return to [facility]. Per Eunice Blase at facility, agreeable to return. Lauris Poag also requests pt return to [facility] at d/c.   Assessment/plan status:  Psychosocial Support/Ongoing Assessment of Needs Other assessment/ plan:   Information/referral to community resources:   [facility]    PATIENT'S/FAMILY'S RESPONSE TO PLAN OF CARE: Pt reports she has a lot of pain and is concerned she will be d/c before ready. CSW reassured pt she was not ready yet. Will continue to follow.       Derenda Fennel, [place] [phone]

## 2012-07-21 NOTE — Progress Notes (Signed)
Subjective: Patient was admitted with rt hip cellulitis and Gram + cocci in blood. She is started on Iv antibiotics. No fever or chills.  Objective: Vital signs in last 24 hours: Temp:  [99 F (37.2 C)-99.8 F (37.7 C)] 99.3 F (37.4 C) (04/28 0529) Pulse Rate:  [81-87] 86 (04/28 0529) Resp:  [16-20] 18 (04/28 0529) BP: (85-109)/(43-64) 100/58 mmHg (04/28 0529) SpO2:  [92 %-96 %] 92 % (04/28 0529) Weight:  [56.7 kg (125 lb)-61.4 kg (135 lb 5.8 oz)] 61.4 kg (135 lb 5.8 oz) (04/28 0529) Weight change:  Last BM Date:  (unknown)  Intake/Output from previous day: 04/27 0701 - 04/28 0700 In: 920 [P.O.:720; IV Piggyback:200] Out: 150 [Urine:150]  PHYSICAL EXAM General appearance: alert and no distress Resp: clear to auscultation bilaterally Cardio: S1, S2 normal GI: soft, non-tender; bowel sounds normal; no masses,  no organomegaly Extremities: extremities normal, atraumatic, no cyanosis or edema  Lab Results:    @labtest @ [facility] No results found for this basename: PHART, PCO2, PO2ART, TCO2, HCO3,  in the last 72 hours CULTURES Recent Results (from the past 240 hour(s))  CULTURE, BLOOD (ROUTINE X 2)     Status: None   Collection Time    07/20/12  4:15 PM      Result Value Range Status   Specimen Description BLOOD LEFT HAND   Final   Special Requests BOTTLES DRAWN AEROBIC ONLY 6CC   Final   Culture PENDING   Incomplete   Report Status PENDING   Incomplete  CULTURE, BLOOD (ROUTINE X 2)     Status: None   Collection Time    07/20/12  4:30 PM      Result Value Range Status   Specimen Description BLOOD LEFT ARM   Final   Special Requests BOTTLES DRAWN AEROBIC AND ANAEROBIC 6CC   Final   Culture PENDING   Incomplete   Report Status PENDING   Incomplete  MRSA PCR SCREENING     Status: None   Collection Time    07/20/12 10:20 PM      Result Value Range Status   MRSA by PCR NEGATIVE  NEGATIVE Final   Comment:            The GeneXpert MRSA Assay (FDA     approved for NASAL  specimens     only), is one component of a     comprehensive MRSA colonization     surveillance program. It is not     intended to diagnose MRSA     infection nor to guide or     monitor treatment for     MRSA infections.   Studies/Results: Dg Hip Complete Right  07/20/2012  *RADIOLOGY REPORT*  Clinical Data: Abnormal labs.  Right hip pain.  Infection.  RIGHT HIP - COMPLETE 2+ VIEW  Comparison: CT 05/22/2012.  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  This was demonstrated on the prior CT.  The right acetabular protrusio.  The findings may be associated with chronic infection or particle disease.  There is no acute fracture or acute osseous abnormality.  Soft tissue anchor is present in the greater trochanter.  Partially visualized lumbar spondylosis.  Incidental visualization of the left hip appears within normal limits.  The obturator rings appear intact.  Sacral arcades grossly appear normal.  Distal femoral stem on the right appears normal.  Heterotopic bone around the right hip.  IMPRESSION: No acute osseous abnormality.  Destructive changes with superior migration of the  prosthetic femoral head of the right bipolar hip hemiarthroplasty and a large amount of heterotopic bone may be associated with chronic infection or particle disease.  Compared to the prior CT 05/22/2012, no interval change.   Original Report Authenticated By: Andreas Newport, M.D.     Medications: I have reviewed the patient's current medications.  Assesment: 1. G + bacteremia 2. Rt hip Cellulitis 3. S/P rt hip replacement Active Problems:   * No active hospital problems. *    Plan: 1. Iv antibiotics 2. Orthopedic consult 3. Continue regular treatment    LOS: 1 day   Trena Dunavan 07/21/2012, 8:15 AM

## 2012-07-21 NOTE — Progress Notes (Addendum)
INITIAL NUTRITION ASSESSMENT  DOCUMENTATION CODES Per approved criteria  -Not Applicable   INTERVENTION:  Ensure Complete po BID, each supplement provides 350 kcal and 13 grams of protein. ProStat 30 ml TID (each 30 ml provides 100 kcal, 15 gr protein)  NUTRITION DIAGNOSIS: Increased nutrient needs related to right hip cellulitis and gram positive cocci in blood as evidenced by unplanned wt loss of 23#, 15% over past year as well as observed poor po intake.  Goal: Pt to meet >/= 90% of their estimated nutrition needs  Monitor:  Po intake, labs and wt trends  Reason for Assessment: Malnutrition Screen  67 y.o. female  Admitting Dx: right hip cellulitis   ASSESSMENT: Pt from [facility]. Known to RD from previous admissions. During visit observed lunch is untouched and she says that she's in too much pain to eat. She drank an Ensure and is agreeable to adding a protein modular in order to better achieve protein goals.  She has experienced 23#, 15% wt loss over past year. She has hx of chronic right hip pain. Irrigation and debridement of right hip (02/08/12). She was referred to [facility] for additional follow-up.   S/p hip arthroplasty (05/01/11). Post-op wound infection. (05/24/11) irrigation and debridement of wound and open surgical repair of gluteal tendon.   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 Ongoing increased energy and protein needs with chronic hip infection.   Nutrition Focused Physical Exam:  Subcutaneous Fat:  Orbital Region: well-nourished Upper Arm Region: well-nourished Thoracic and Lumbar Region: n/a  Muscle:  Temple Region: well-nourished Clavicle Bone Region: well-nourished Clavicle and Acromion Bone Region: well-nourished Scapular Bone Region: n/a Dorsal Hand: mild-moderate malnutriton Patellar Region: mild-moderate malnutriton Anterior Thigh Region: severe malnutriton Posterior Calf Region: severe malnutrition  Edema: none  noted    Height: Ht Readings from Last 1 Encounters:  07/20/12 5\' 7"  (1.702 m)    Weight: Wt Readings from Last 1 Encounters:  07/21/12 135 lb 5.8 oz (61.4 kg)    Ideal Body Weight: 135# (61.3 kg)  % Ideal Body Weight: 100%  Wt Readings from Last 10 Encounters:  07/21/12 135 lb 5.8 oz (61.4 kg)  05/22/12 125 lb 8 oz (56.926 kg)  02/06/12 127 lb 13.9 oz (58 kg)  02/06/12 127 lb 13.9 oz (58 kg)  02/06/12 127 lb 13.9 oz (58 kg)  01/15/12 131 lb 4.8 oz (59.557 kg)  12/05/11 125 lb (56.7 kg)  10/13/11 125 lb (56.7 kg)  10/10/11 158 lb (71.668 kg)  08/23/11 158 lb (71.668 kg)    Usual Body Weight: 158#  % Usual Body Weight: 85%  BMI:  Body mass index is 21.2 kg/(m^2).Normal Range  Estimated Nutritional Needs: Kcal: 1525-1830  Protein: 67-79 gr Fluid: >1800 ml/day  Skin: abrasions to buttocks, right hip cellulitis (gram + cocci in blood)  Diet Order: General  EDUCATION NEEDS: -No education needs identified at this time   Intake/Output Summary (Last 24 hours) at 07/21/12 1133 Last data filed at 07/21/12 0544  Gross per 24 hour  Intake    920 ml  Output    150 ml  Net    770 ml    Last BM:  PTA  Labs:   Recent Labs Lab 07/20/12 1620  NA 136  K 4.4  CL 99  CO2 28  BUN 20  CREATININE 0.60  CALCIUM 9.3  GLUCOSE 107*    CBG (last 3)  No results found for this basename: GLUCAP,  in the last 72 hours  Scheduled Meds: . antiseptic oral rinse  15 mL Mouth Rinse BID  . buPROPion  450 mg Oral q morning - 10a  . citalopram  10 mg Oral Daily  . dicyclomine  10 mg Oral TID AC & HS  . enoxaparin (LOVENOX) injection  40 mg Subcutaneous Q24H  . feeding supplement  237 mL Oral BID BM  . fentaNYL  25 mcg Transdermal Q72H  . fesoterodine  4 mg Oral Daily  . gabapentin  300 mg Oral TID  . mirtazapine  15 mg Oral QPM  . morphine  15 mg Oral BID  . OLANZapine  10 mg Oral QHS  . pantoprazole  40 mg Oral Daily  . simvastatin  20 mg Oral QHS  . vancomycin   1,000 mg Intravenous Q12H    Continuous Infusions:   Past Medical History  Diagnosis Date  . IBS (irritable bowel syndrome)   . GERD (gastroesophageal reflux disease)   . Gout   . Chronic back pain   . Chronic right hip pain   . Chronic diarrhea   . Depression   . Anxiety and depression   . Cellulitis   . Anemia     Past Surgical History  Procedure Laterality Date  . Hip arthroplasty  05/01/2011    Procedure: right ARTHROPLASTY BIPOLAR HIP;  Surgeon: Fuller Canada, MD;  Location: [facility];  Service: Orthopedics;  Laterality: Right;  . Incision and drainage of wound  05/24/2011    Procedure: IRRIGATION AND DEBRIDEMENT WOUND;  Surgeon: Fuller Canada, MD;  Location: [facility];  Service: Orthopedics;  Laterality: Right;  . Incision and drainage hip  02/08/2012    Procedure: IRRIGATION AND DEBRIDEMENT HIP;  Surgeon: Vickki Hearing, MD;  Location: [facility];  Service: Orthopedics;  Laterality: Right;    Royann Shivers MS,RD,LDN,CSG Office: [phone] Pager: [phone]

## 2012-07-21 NOTE — Progress Notes (Signed)
ANTIBIOTIC CONSULT NOTE - INITIAL  Pharmacy Consult for Vancomycin Indication: infected hip prosthesis and bacteremia  Allergies  Allergen Reactions  . Diphenhydramine Other (See Comments)    Feels bad   Patient Measurements: Height: 5\' 7"  (170.2 cm) Weight: 135 lb 5.8 oz (61.4 kg) IBW/kg (Calculated) : 61.6  Vital Signs: Temp: 99.3 F (37.4 C) (04/28 0529) Temp src: Oral (04/28 0529) BP: 100/58 mmHg (04/28 0529) Pulse Rate: 86 (04/28 0529) Intake/Output from previous day: 04/27 0701 - 04/28 0700 In: 920 [P.O.:720; IV Piggyback:200] Out: 150 [Urine:150] Intake/Output from this shift:    Labs:  Recent Labs  07/20/12 1620  WBC 7.6  HGB 9.5*  PLT 369  CREATININE 0.60   Estimated Creatinine Clearance: 66.1 ml/min (by C-G formula based on Cr of 0.6). No results found for this basename: VANCOTROUGH, Leodis Binet, VANCORANDOM, GENTTROUGH, GENTPEAK, GENTRANDOM, TOBRATROUGH, TOBRAPEAK, TOBRARND, AMIKACINPEAK, AMIKACINTROU, AMIKACIN,  in the last 72 hours   Microbiology: Recent Results (from the past 720 hour(s))  CULTURE, BLOOD (ROUTINE X 2)     Status: None   Collection Time    07/20/12  4:15 PM      Result Value Range Status   Specimen Description BLOOD LEFT HAND   Final   Special Requests BOTTLES DRAWN AEROBIC ONLY 6CC   Final   Culture PENDING   Incomplete   Report Status PENDING   Incomplete  CULTURE, BLOOD (ROUTINE X 2)     Status: None   Collection Time    07/20/12  4:30 PM      Result Value Range Status   Specimen Description BLOOD LEFT ARM   Final   Special Requests BOTTLES DRAWN AEROBIC AND ANAEROBIC 6CC   Final   Culture PENDING   Incomplete   Report Status PENDING   Incomplete  MRSA PCR SCREENING     Status: None   Collection Time    07/20/12 10:20 PM      Result Value Range Status   MRSA by PCR NEGATIVE  NEGATIVE Final   Comment:            The GeneXpert MRSA Assay (FDA     approved for NASAL specimens     only), is one component of a   comprehensive MRSA colonization     surveillance program. It is not     intended to diagnose MRSA     infection nor to guide or     monitor treatment for     MRSA infections.   Medical History: Past Medical History  Diagnosis Date  . IBS (irritable bowel syndrome)   . GERD (gastroesophageal reflux disease)   . Gout   . Chronic back pain   . Chronic right hip pain   . Chronic diarrhea   . Depression   . Anxiety and depression   . Cellulitis   . Anemia    Medications:  Scheduled:  . antiseptic oral rinse  15 mL Mouth Rinse BID  . buPROPion  450 mg Oral q morning - 10a  . citalopram  10 mg Oral Daily  . dicyclomine  10 mg Oral TID AC & HS  . enoxaparin (LOVENOX) injection  40 mg Subcutaneous Q24H  . feeding supplement  237 mL Oral BID BM  . fentaNYL  25 mcg Transdermal Q72H  . fesoterodine  4 mg Oral Daily  . gabapentin  300 mg Oral TID  . mirtazapine  15 mg Oral QPM  . morphine  15 mg Oral BID  . OLANZapine  10 mg Oral QHS  . pantoprazole  40 mg Oral Daily  . simvastatin  20 mg Oral QHS  . vancomycin  1,000 mg Intravenous Q12H  . [DISCONTINUED] citalopram  10 mg Oral Daily  . [DISCONTINUED] enoxaparin (LOVENOX) injection  40 mg Subcutaneous Q24H  . [DISCONTINUED] fentaNYL  25 mcg Transdermal Q72H  . [DISCONTINUED] fesoterodine  4 mg Oral Daily  . [DISCONTINUED] gabapentin  300 mg Oral TID  . [DISCONTINUED] mirtazapine  15 mg Oral QPM  . [DISCONTINUED] pantoprazole  40 mg Oral Daily  . [DISCONTINUED] vancomycin  1,000 mg Intravenous Once  . [DISCONTINUED] vancomycin  1,000 mg Intravenous Q12H   Assessment: 67yo female admitted with infected hip prosthesis and bacteremia.  Started on Vancomycin 1gm IV q12hrs on admission.  Pt has recently completed a course of Vancomycin for same diagnosis and required 1gm IV q12hrs.  Pt admitted from [facility].  Cultures done at [facility] reportedly positive for gram positive cocci.  Cultures done on admission are pending.  Has good renal fxn.   Estimated Creatinine Clearance: 66.1 ml/min (by C-G formula based on Cr of 0.6).  Goal of Therapy:  Vancomycin trough level 10-15 mcg/ml  Plan:  Vancomycin 1gm IV q12hrs Check trough at steady state Monitor labs, renal fxn, and cultures Duration of therapy per MD  Valrie Hart A 07/21/2012,10:23 AM

## 2012-07-21 NOTE — Progress Notes (Signed)
Patient's urinary output over the past 12 hours has been less than 200cc. Patient drinking fluids. Doctor notified and no new orders given. Will continue to monitor patient.

## 2012-07-21 NOTE — Progress Notes (Signed)
Midline single lumen PICC right Basilic,unable to flush.Dr Felecia Shelling notified.

## 2012-07-22 DIAGNOSIS — T889XXS Complication of surgical and medical care, unspecified, sequela: Secondary | ICD-10-CM

## 2012-07-22 DIAGNOSIS — G8929 Other chronic pain: Secondary | ICD-10-CM

## 2012-07-22 DIAGNOSIS — Z96649 Presence of unspecified artificial hip joint: Secondary | ICD-10-CM

## 2012-07-22 DIAGNOSIS — L02419 Cutaneous abscess of limb, unspecified: Secondary | ICD-10-CM

## 2012-07-22 NOTE — Consult Note (Signed)
Reason for Consult: Right hip pain positive blood culture Referring Physician: Dr. Roderic Scarce is an 67 y.o. female.  HPI: 67 year-old female with chronic right hip pain status post right hip fracture treated with bipolar hip arthroplasty status post incision and drainage x2 once on 02/08/2012 and once on 05/24/2011 initial procedure 05/01/2011. The patient was on IV antibiotics recently once the antibiotic stopped several weeks passed and then she developed right hip pain a temperature of 99 and what is reported as a positive blood culture. She was brought back in the hospital for further care. Since her last hospital visit she was evaluated at [facility] and the feeling at that time was that she needed further laboratory workup prior to definitive procedure.  Past Medical History  Diagnosis Date  . IBS (irritable bowel syndrome)   . GERD (gastroesophageal reflux disease)   . Gout   . Chronic back pain   . Chronic right hip pain   . Chronic diarrhea   . Depression   . Anxiety and depression   . Cellulitis   . Anemia     Past Surgical History  Procedure Laterality Date  . Hip arthroplasty  05/01/2011    Procedure: right ARTHROPLASTY BIPOLAR HIP;  Surgeon: Fuller Canada, MD;  Location: [facility];  Service: Orthopedics;  Laterality: Right;  . Incision and drainage of wound  05/24/2011    Procedure: IRRIGATION AND DEBRIDEMENT WOUND;  Surgeon: Fuller Canada, MD;  Location: [facility];  Service: Orthopedics;  Laterality: Right;  . Incision and drainage hip  02/08/2012    Procedure: IRRIGATION AND DEBRIDEMENT HIP;  Surgeon: Vickki Hearing, MD;  Location: [facility];  Service: Orthopedics;  Laterality: Right;    History reviewed. No pertinent family history.  Social History:  reports that she has quit smoking. She has never used smokeless tobacco. She reports that she does not drink alcohol or use illicit drugs.  Allergies:  Allergies  Allergen Reactions  . Diphenhydramine  Other (See Comments)    Feels bad    Medications: I have reviewed the patient's current medications.  Results for orders placed during the hospital encounter of 07/20/12 (from the past 48 hour(s))  CULTURE, BLOOD (ROUTINE X 2)     Status: None   Collection Time    07/20/12  4:15 PM      Result Value Range   Specimen Description BLOOD LEFT HAND     Special Requests BOTTLES DRAWN AEROBIC ONLY 6CC     Culture NO GROWTH 1 DAY     Report Status PENDING    CBC WITH DIFFERENTIAL     Status: Abnormal   Collection Time    07/20/12  4:20 PM      Result Value Range   WBC 7.6  4.0 - 10.5 K/uL   RBC 3.67 (*) 3.87 - 5.11 MIL/uL   Hemoglobin 9.5 (*) 12.0 - 15.0 g/dL   HCT 57.8 (*) 46.9 - 62.9 %   MCV 81.5  78.0 - 100.0 fL   MCH 25.9 (*) 26.0 - 34.0 pg   MCHC 31.8  30.0 - 36.0 g/dL   RDW 52.8 (*) 41.3 - 24.4 %   Platelets 369  150 - 400 K/uL   Neutrophils Relative 73  43 - 77 %   Neutro Abs 5.5  1.7 - 7.7 K/uL   Lymphocytes Relative 17  12 - 46 %   Lymphs Abs 1.3  0.7 - 4.0 K/uL   Monocytes Relative 9  3 - 12 %   Monocytes Absolute 0.6  0.1 - 1.0 K/uL   Eosinophils Relative 2  0 - 5 %   Eosinophils Absolute 0.1  0.0 - 0.7 K/uL   Basophils Relative 0  0 - 1 %   Basophils Absolute 0.0  0.0 - 0.1 K/uL  BASIC METABOLIC PANEL     Status: Abnormal   Collection Time    07/20/12  4:20 PM      Result Value Range   Sodium 136  135 - 145 mEq/L   Potassium 4.4  3.5 - 5.1 mEq/L   Chloride 99  96 - 112 mEq/L   CO2 28  19 - 32 mEq/L   Glucose, Bld 107 (*) 70 - 99 mg/dL   BUN 20  6 - 23 mg/dL   Creatinine, Ser 4.09  0.50 - 1.10 mg/dL   Calcium 9.3  8.4 - 81.1 mg/dL   GFR calc non Af Amer >90  >90 mL/min   GFR calc Af Amer >90  >90 mL/min   Comment:            The eGFR has been calculated     using the CKD EPI equation.     This calculation has not been     validated in all clinical     situations.     eGFR's persistently     <90 mL/min signify     possible Chronic Kidney Disease.   CULTURE, BLOOD (ROUTINE X 2)     Status: None   Collection Time    07/20/12  4:30 PM      Result Value Range   Specimen Description BLOOD LEFT ARM     Special Requests BOTTLES DRAWN AEROBIC AND ANAEROBIC 6CC     Culture NO GROWTH 1 DAY     Report Status PENDING    MRSA PCR SCREENING     Status: None   Collection Time    07/20/12 10:20 PM      Result Value Range   MRSA by PCR NEGATIVE  NEGATIVE   Comment:            The GeneXpert MRSA Assay (FDA     approved for NASAL specimens     only), is one component of a     comprehensive MRSA colonization     surveillance program. It is not     intended to diagnose MRSA     infection nor to guide or     monitor treatment for     MRSA infections.    Dg Hip Complete Right  07/20/2012  *RADIOLOGY REPORT*  Clinical Data: Abnormal labs.  Right hip pain.  Infection.  RIGHT HIP - COMPLETE 2+ VIEW  Comparison: CT 05/22/2012.  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  This was demonstrated on the prior CT.  The right acetabular protrusio.  The findings may be associated with chronic infection or particle disease.  There is no acute fracture or acute osseous abnormality.  Soft tissue anchor is present in the greater trochanter.  Partially visualized lumbar spondylosis.  Incidental visualization of the left hip appears within normal limits.  The obturator rings appear intact.  Sacral arcades grossly appear normal.  Distal femoral stem on the right appears normal.  Heterotopic bone around the right hip.  IMPRESSION: No acute osseous abnormality.  Destructive changes with superior migration of the prosthetic femoral head of the right bipolar hip hemiarthroplasty and a large amount of  heterotopic bone may be associated with chronic infection or particle disease.  Compared to the prior CT 05/22/2012, no interval change.   Original Report Authenticated By: Andreas Newport, M.D.     Review of Systems  Unable to  perform ROS: mental acuity   Blood pressure 96/59, pulse 77, temperature 99.5 F (37.5 C), temperature source Oral, resp. rate 20, height 5\' 7"  (1.702 m), weight 134 lb 7.7 oz (61 kg), SpO2 92.00%. Physical Exam BP 96/59  Pulse 77  Temp(Src) 99.5 F (37.5 C) (Oral)  Resp 20  Ht 5\' 7"  (1.702 m)  Wt 134 lb 7.7 oz (61 kg)  BMI 21.06 kg/m2  SpO2 92% The patient's developmental status is normal she has a small to medium frame there are no gross deformities her grooming is normal  Peripheral vascular system revealed no swelling or varicose veins palpation reveals normal pulses and temperature without edema or tenderness  Cervical spine lymph nodes are benign groin lymph nodes are benign  The patient says she can't walk her notes indicates that she does ambulate with a walker though minimally and spends most of the time in wheelchair  Her upper extremities on inspection are without alignment abnormalities no defects masses or effusions there is no contracture noted no crepitation and the joints are stable without subluxation muscle tone remains normal  The left lower remedy is notable for normal alignment, normal range of motion, no evidence of subluxation or laxity and normal strength and muscle tone  The right hip incision is tender there is a slight amount of redness distally and tenderness right over the greater trochanter there is no swelling of the thigh the right lower extremity is shortened without external rotation range of motion is not painful in the joint but painful over the greater trochanter stability cannot be assessed and muscle tone was normal  Skin and no palpable masses there is tenderness and a slight amount of redness over the distal portion of the incision over the greater trochanter. 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 The patient will need suppressive IV antibiotics until she can be seen at [facility] for definitive care.  Fuller Canada 07/22/2012, 7:25 AM

## 2012-07-22 NOTE — Progress Notes (Signed)
Subjective: Patient continued to complain of rt hip pain. She is getting Iv antibiotics and painmedication. Objective: Vital signs in last 24 hours: Temp:  [99.5 F (37.5 C)-100.1 F (37.8 C)] 99.5 F (37.5 C) (04/29 0416) Pulse Rate:  [76-93] 77 (04/29 0416) Resp:  [20] 20 (04/29 0416) BP: (92-96)/(55-59) 96/59 mmHg (04/29 0416) SpO2:  [92 %-98 %] 92 % (04/29 0416) Weight:  [61 kg (134 lb 7.7 oz)] 61 kg (134 lb 7.7 oz) (04/29 0416) Weight change: 4.3 kg (9 lb 7.7 oz) Last BM Date:  (unknown)  Intake/Output from previous day: 04/28 0701 - 04/29 0700 In: 440 [P.O.:240; IV Piggyback:200] Out: 1300 [Urine:1300]  PHYSICAL EXAM General appearance: alert and no distress Resp: clear to auscultation bilaterally Cardio: S1, S2 normal GI: soft, non-tender; bowel sounds normal; no masses,  no organomegaly Extremities: extremities normal, atraumatic, no cyanosis or edema  Lab Results:    @labtest @ ABGS No results found for this basename: PHART, PCO2, PO2ART, TCO2, HCO3,  in the last 72 hours CULTURES Recent Results (from the past 240 hour(s))  CULTURE, BLOOD (ROUTINE X 2)     Status: None   Collection Time    07/20/12  4:15 PM      Result Value Range Status   Specimen Description BLOOD LEFT HAND   Final   Special Requests BOTTLES DRAWN AEROBIC ONLY 6CC   Final   Culture NO GROWTH 1 DAY   Final   Report Status PENDING   Incomplete  CULTURE, BLOOD (ROUTINE X 2)     Status: None   Collection Time    07/20/12  4:30 PM      Result Value Range Status   Specimen Description BLOOD LEFT ARM   Final   Special Requests BOTTLES DRAWN AEROBIC AND ANAEROBIC 6CC   Final   Culture NO GROWTH 1 DAY   Final   Report Status PENDING   Incomplete  MRSA PCR SCREENING     Status: None   Collection Time    07/20/12 10:20 PM      Result Value Range Status   MRSA by PCR NEGATIVE  NEGATIVE Final   Comment:            The GeneXpert MRSA Assay (FDA     approved for NASAL specimens     only), is  one component of a     comprehensive MRSA colonization     surveillance program. It is not     intended to diagnose MRSA     infection nor to guide or     monitor treatment for     MRSA infections.   Studies/Results: Dg Hip Complete Right  07/20/2012  *RADIOLOGY REPORT*  Clinical Data: Abnormal labs.  Right hip pain.  Infection.  RIGHT HIP - COMPLETE 2+ VIEW  Comparison: CT 05/22/2012.  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  This was demonstrated on the prior CT.  The right acetabular protrusio.  The findings may be associated with chronic infection or particle disease.  There is no acute fracture or acute osseous abnormality.  Soft tissue anchor is present in the greater trochanter.  Partially visualized lumbar spondylosis.  Incidental visualization of the left hip appears within normal limits.  The obturator rings appear intact.  Sacral arcades grossly appear normal.  Distal femoral stem on the right appears normal.  Heterotopic bone around the right hip.  IMPRESSION: No acute osseous abnormality.  Destructive changes with superior migration of the prosthetic  femoral head of the right bipolar hip hemiarthroplasty and a large amount of heterotopic bone may be associated with chronic infection or particle disease.  Compared to the prior CT 05/22/2012, no interval change.   Original Report Authenticated By: Andreas Newport, M.D.     Medications: I have reviewed the patient's current medications.  Assesment: 1. G + bacteremia 2. Rt hip Cellulitis 3. S/P rt hip replacement Active Problems:   * No active hospital problems. *    Plan: 1. Iv antibiotics 2. Orthopedic consult appreciated 3. Continue regular treatment    LOS: 2 days   Marda Breidenbach 07/22/2012, 8:02 AM

## 2012-07-23 LAB — CBC
HCT: 30.6 % — ABNORMAL LOW (ref 36.0–46.0)
Hemoglobin: 9.4 g/dL — ABNORMAL LOW (ref 12.0–15.0)
MCH: 25.3 pg — ABNORMAL LOW (ref 26.0–34.0)
MCHC: 30.7 g/dL (ref 30.0–36.0)
MCV: 82.5 fL (ref 78.0–100.0)

## 2012-07-23 LAB — BASIC METABOLIC PANEL
BUN: 19 mg/dL (ref 6–23)
CO2: 31 mEq/L (ref 19–32)
Chloride: 104 mEq/L (ref 96–112)
GFR calc Af Amer: >90 mL/min (ref >90)
Glucose, Bld: 103 mg/dL — ABNORMAL HIGH (ref 70–99)
Potassium: 3.8 mEq/L (ref 3.5–5.1)

## 2012-07-23 NOTE — Plan of Care (Signed)
Problem: Phase I Progression Outcomes Goal: OOB as tolerated unless otherwise ordered Outcome: Not Applicable Date Met:  07/23/12 Patient states she is bedridden d/t hip and leg pain

## 2012-07-23 NOTE — Progress Notes (Signed)
UR Chart Review Completed  

## 2012-07-23 NOTE — Progress Notes (Signed)
ANTIBIOTIC CONSULT NOTE   Pharmacy Consult for Vancomycin Indication: infected hip prosthesis and bacteremia  Allergies  Allergen Reactions  . Diphenhydramine Other (See Comments)    Feels bad   Patient Measurements: Height: 5\' 7"  (170.2 cm) Weight: 134 lb 14.7 oz (61.2 kg) IBW/kg (Calculated) : 61.6  Vital Signs: Temp: 98.5 F (36.9 C) (04/30 0410) Temp src: Oral (04/30 0410) BP: 87/50 mmHg (04/30 0410) Pulse Rate: 73 (04/30 0410) Intake/Output from previous day: 04/29 0701 - 04/30 0700 In: 600 [IV Piggyback:600] Out: 1351 [Urine:1350; Stool:1] Intake/Output from this shift:    Labs:  Recent Labs  07/20/12 1620 07/23/12 0614  WBC 7.6 5.4  HGB 9.5* 9.4*  PLT 369 396  CREATININE 0.60 0.64   Estimated Creatinine Clearance: 65.9 ml/min (by C-G formula based on Cr of 0.64).  Recent Labs  07/23/12 0618  VANCOTROUGH 17.0    Microbiology: Recent Results (from the past 720 hour(s))  CULTURE, BLOOD (ROUTINE X 2)     Status: None   Collection Time    07/20/12  4:15 PM      Result Value Range Status   Specimen Description BLOOD LEFT HAND   Final   Special Requests BOTTLES DRAWN AEROBIC ONLY 6CC   Final   Culture NO GROWTH 2 DAYS   Final   Report Status PENDING   Incomplete  CULTURE, BLOOD (ROUTINE X 2)     Status: None   Collection Time    07/20/12  4:30 PM      Result Value Range Status   Specimen Description BLOOD LEFT ARM   Final   Special Requests BOTTLES DRAWN AEROBIC AND ANAEROBIC 6CC   Final   Culture NO GROWTH 2 DAYS   Final   Report Status PENDING   Incomplete  MRSA PCR SCREENING     Status: None   Collection Time    07/20/12 10:20 PM      Result Value Range Status   MRSA by PCR NEGATIVE  NEGATIVE Final   Comment:            The GeneXpert MRSA Assay (FDA     approved for NASAL specimens     only), is one component of a     comprehensive MRSA colonization     surveillance program. It is not     intended to diagnose MRSA     infection nor to  guide or     monitor treatment for     MRSA infections.   Medical History: Past Medical History  Diagnosis Date  . IBS (irritable bowel syndrome)   . GERD (gastroesophageal reflux disease)   . Gout   . Chronic back pain   . Chronic right hip pain   . Chronic diarrhea   . Depression   . Anxiety and depression   . Cellulitis   . Anemia    Medications:  Scheduled:  . antiseptic oral rinse  15 mL Mouth Rinse BID  . buPROPion  450 mg Oral q morning - 10a  . citalopram  10 mg Oral Daily  . dicyclomine  10 mg Oral TID AC & HS  . enoxaparin (LOVENOX) injection  40 mg Subcutaneous Q24H  . feeding supplement  237 mL Oral BID BM  . feeding supplement  30 mL Oral TID WC  . fentaNYL  25 mcg Transdermal Q72H  . fesoterodine  4 mg Oral Daily  . gabapentin  300 mg Oral TID  . mirtazapine  15 mg Oral QPM  .  morphine  15 mg Oral BID  . OLANZapine  10 mg Oral QHS  . pantoprazole  40 mg Oral Daily  . simvastatin  20 mg Oral QHS  . vancomycin  1,000 mg Intravenous Q12H   Assessment: 67yo female admitted with infected hip prosthesis and bacteremia.  Started on Vancomycin 1gm IV q12hrs on admission.  Pt has recently completed a course of Vancomycin for same diagnosis and required 1gm IV q12hrs.  Pt admitted from [facility].  Cultures done at [facility] reportedly positive for gram positive cocci.  Cultures done on admission are pending.  Trough level is on target.  Has good renal fxn.  Estimated Creatinine Clearance: 65.9 ml/min (by C-G formula based on Cr of 0.64).  Goal of Therapy:  Vancomycin trough level 15-20  Plan:  Continue Vancomycin 1gm IV q12hrs Check trough level weekly while on Vancomycin Check SCr at least twice weekly while on Vancomycin Monitor labs, renal fxn, and cultures Duration of therapy per MD  Valrie Hart A 07/23/2012,9:32 AM

## 2012-07-23 NOTE — Care Management Note (Signed)
    Page 1 of 1   07/24/2012     3:22:03 PM   CARE MANAGEMENT NOTE 07/24/2012  Patient:  [name], [name]   Account Number:  [id number]  Date Initiated:  07/23/2012  Documentation initiated by:  Rosemary Holms  Subjective/Objective Assessment:   Pt admitted from [facility] with infection/pain in hip. Plans to transfer care to [facility]. IV AB required until she can be seen at [facility]     Action/Plan:   Anticipated DC Date:  07/24/2012   Anticipated DC Plan:  SKILLED NURSING FACILITY  In-house referral  Clinical Social Worker      DC Planning Services  CM consult      Choice offered to / List presented to:             Status of service:  Completed, signed off Medicare Important Message given?  YES (If response is "NO", the following Medicare IM given date fields will be blank) Date Medicare IM given:  07/24/2012 Date Additional Medicare IM given:    Discharge Disposition:  SKILLED NURSING FACILITY  Per UR Regulation:    If discussed at Long Length of Stay Meetings, dates discussed:    Comments:  07/24/12 Rosemary Holms RN BSN CM PICC line flush and patent per nurse Aundra Millet. Pt being dc'd late today per Dr. Felecia Shelling. [facility] notifed by CSW Pt states she can not sign IM because she hurts but stated she understood the process. 07/23/12 Rosemary Holms RN BSN CM

## 2012-07-24 ENCOUNTER — Ambulatory Visit (INDEPENDENT_AMBULATORY_CARE_PROVIDER_SITE_OTHER): Payer: Medicare Other | Admitting: Otolaryngology

## 2012-07-24 MED ORDER — VANCOMYCIN HCL IN DEXTROSE 1-5 GM/200ML-% IV SOLN: 1000.0000 mg | Freq: Two times a day (BID) | INTRAVENOUS | Status: DC

## 2012-07-24 MED ORDER — HEPARIN SOD (PORK) LOCK FLUSH 100 UNIT/ML IV SOLN
250.0000 [IU] | INTRAVENOUS | Status: AC | PRN
  Administered 2012-07-24: 250 [IU]
  Filled 2012-07-24: qty 5

## 2012-07-24 NOTE — Plan of Care (Signed)
Problem: Phase I Progression Outcomes Goal: Pain controlled with appropriate interventions Outcome: Adequate for Discharge Pt has chronic pain.      

## 2012-07-24 NOTE — Progress Notes (Signed)
Patient to be discharged back to [facility].  DC summary faxed by CSW and facility aware.  Report called and given to [facility].  Peripheral IV removed.  PICC left in place and flushed with heparin.  No clothing at bedside of patients own, dressed in transfer gown.  Patient complaining of R hip pain - PRN medication given. Packet complete and EMS called to transfer

## 2012-07-24 NOTE — Progress Notes (Signed)
Physician Discharge Summary  Patient ID: [name] MRN: [id number] DOB/AGE: 10/22/1945 67 y.o. Primary Care Physician:Tehillah Cipriani, MD Admit date: 07/20/2012 Discharge date: 07/24/2012    Discharge Diagnoses:   1. G + bacteremia  2. Rt hip Cellulitis  3. S/P rt hip replacement  Active Problems:   * No active hospital problems. *     Medication List    TAKE these medications       ALPRAZolam 0.5 MG tablet  Commonly known as:  XANAX  Take 0.5 mg by mouth every 12 (twelve) hours as needed for anxiety.     buPROPion 150 MG 24 hr tablet  Commonly known as:  WELLBUTRIN XL  Take 450 mg by mouth every morning.     citalopram 10 MG tablet  Commonly known as:  CELEXA  Take 10 mg by mouth daily.     dicyclomine 10 MG capsule  Commonly known as:  BENTYL  Take 1 capsule (10 mg total) by mouth 4 (four) times daily -  before meals and at bedtime.     fentaNYL 25 MCG/HR  Commonly known as:  DURAGESIC - dosed mcg/hr  Place 1 patch onto the skin every 3 (three) days. Applied for right hip pain for 72 hours. Remove and discard used patches     fesoterodine 4 MG Tb24  Commonly known as:  TOVIAZ  Take 1 tablet (4 mg total) by mouth daily.     gabapentin 300 MG capsule  Commonly known as:  NEURONTIN  Take 1 capsule (300 mg total) by mouth 3 (three) times daily.     HYDROcodone-acetaminophen 5-325 MG per tablet  Commonly known as:  NORCO  Take 1 tablet by mouth every 4 (four) hours as needed for pain.     HYDROGEL Gel  Apply 1 application topically every other day. Applied to the left hip for wound cleansing with normal saline or wound cleanser     loperamide 2 MG capsule  Commonly known as:  IMODIUM  Take 2-4 mg by mouth 4 (four) times daily as needed. For diarrhea, take 2 tablets (4mg ) initially then take 2 mg after each loose stool     mirtazapine 15 MG tablet  Commonly known as:  REMERON  Take 15 mg by mouth every evening.     morphine 15 MG 12 hr tablet   Commonly known as:  MS CONTIN  Take 1 tablet (15 mg total) by mouth 2 (two) times daily.     OLANZapine 10 MG tablet  Commonly known as:  ZYPREXA  Take 10 mg by mouth at bedtime.     omeprazole 20 MG capsule  Commonly known as:  PRILOSEC  Take 20 mg by mouth daily.     PRO-STAT AWC Liqd  Take 30 mLs by mouth 2 (two) times daily.     Resource 2.0 Liqd  Take 90 mLs by mouth 2 (two) times daily.     simvastatin 20 MG tablet  Commonly known as:  ZOCOR  Take 20 mg by mouth at bedtime.     vancomycin 1 GM/200ML Soln  Commonly known as:  VANCOCIN  Inject 200 mLs (1,000 mg total) into the vein every 12 (twelve) hours.        Discharged Condition: improved    Consults: orhopedics  Significant Diagnostic Studies: Dg Hip Complete Right  07/20/2012  *RADIOLOGY REPORT*  Clinical Data: Abnormal labs.  Right hip pain.  Infection.  RIGHT HIP - COMPLETE 2+ VIEW  Comparison: CT 05/22/2012.  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  This was demonstrated on the prior CT.  The right acetabular protrusio.  The findings may be associated with chronic infection or particle disease.  There is no acute fracture or acute osseous abnormality.  Soft tissue anchor is present in the greater trochanter.  Partially visualized lumbar spondylosis.  Incidental visualization of the left hip appears within normal limits.  The obturator rings appear intact.  Sacral arcades grossly appear normal.  Distal femoral stem on the right appears normal.  Heterotopic bone around the right hip.  IMPRESSION: No acute osseous abnormality.  Destructive changes with superior migration of the prosthetic femoral head of the right bipolar hip hemiarthroplasty and a large amount of heterotopic bone may be associated with chronic infection or particle disease.  Compared to the prior CT 05/22/2012, no interval change.   Original Report Authenticated By: Andreas Newport, M.D.     Lab  Results: Basic Metabolic Panel:  Recent Labs  16/10/96 0614  NA 142  K 3.8  CL 104  CO2 31  GLUCOSE 103*  BUN 19  CREATININE 0.64  CALCIUM 9.2   Liver Function Tests: No results found for this basename: AST, ALT, ALKPHOS, BILITOT, PROT, ALBUMIN,  in the last 72 hours   CBC:  Recent Labs  07/23/12 0614  WBC 5.4  HGB 9.4*  HCT 30.6*  MCV 82.5  PLT 396    Recent Results (from the past 240 hour(s))  CULTURE, BLOOD (ROUTINE X 2)     Status: None   Collection Time    07/20/12  4:15 PM      Result Value Range Status   Specimen Description BLOOD LEFT HAND   Final   Special Requests BOTTLES DRAWN AEROBIC ONLY 6CC   Final   Culture NO GROWTH 4 DAYS   Final   Report Status PENDING   Incomplete  CULTURE, BLOOD (ROUTINE X 2)     Status: None   Collection Time    07/20/12  4:30 PM      Result Value Range Status   Specimen Description BLOOD LEFT ARM   Final   Special Requests BOTTLES DRAWN AEROBIC AND ANAEROBIC 6CC   Final   Culture NO GROWTH 4 DAYS   Final   Report Status PENDING   Incomplete  MRSA PCR SCREENING     Status: None   Collection Time    07/20/12 10:20 PM      Result Value Range Status   MRSA by PCR NEGATIVE  NEGATIVE Final   Comment:            The GeneXpert MRSA Assay (FDA     approved for NASAL specimens     only), is one component of a     comprehensive MRSA colonization     surveillance program. It is not     intended to diagnose MRSA     infection nor to guide or     monitor treatment for     MRSA infections.     Hospital Course:   This is a 67 years old female patient was admitted ddue to rt hip cellulitis and G+ bacteremia. Patient had rt hip replacement previously and the surgery was done by Dr. Romeo Apple. She has chronic periprosthetic infection. Dr. Romeo Apple was consult and recommended to continue iv antibiotics until patient is seen in [facility] hospital. Patient will be discharged to [facility] to continue IV antibiotics.  Discharge  Exam: Blood  pressure 95/54, pulse 87, temperature 98.1 F (36.7 C), temperature source Oral, resp. rate 18, height 5\' 7"  (1.702 m), weight 62 kg (136 lb 11 oz), SpO2 92.00%.   Disposition:  Nursing home      Signed: Lakota Schweppe  07/24/2012, 4:08 PM

## 2012-07-24 NOTE — Progress Notes (Signed)
Subjective: Patient is resting. She continued to complain of pain in the rt hip. Objective: Vital signs in last 24 hours: Temp:  [98.4 F (36.9 C)-99.2 F (37.3 C)] 98.4 F (36.9 C) (05/01 0551) Pulse Rate:  [70-74] 72 (05/01 0551) Resp:  [18-20] 20 (05/01 0551) BP: (92-100)/(59-68) 100/68 mmHg (05/01 0551) SpO2:  [93 %-96 %] 94 % (05/01 0551) Weight:  [62 kg (136 lb 11 oz)] 62 kg (136 lb 11 oz) (05/01 0551) Weight change: 0.8 kg (1 lb 12.2 oz) Last BM Date: 07/23/12  Intake/Output from previous day: 04/30 0701 - 05/01 0700 In: 1360 [P.O.:960; IV Piggyback:400] Out: 1850 [Urine:1850]  PHYSICAL EXAM General appearance: alert and no distress Resp: clear to auscultation bilaterally Cardio: S1, S2 normal GI: soft, non-tender; bowel sounds normal; no masses,  no organomegaly Extremities: extremities normal, atraumatic, no cyanosis or edema  Lab Results:    @labtest @ [facility] No results found for this basename: PHART, PCO2, PO2ART, TCO2, HCO3,  in the last 72 hours CULTURES Recent Results (from the past 240 hour(s))  CULTURE, BLOOD (ROUTINE X 2)     Status: None   Collection Time    07/20/12  4:15 PM      Result Value Range Status   Specimen Description BLOOD LEFT HAND   Final   Special Requests BOTTLES DRAWN AEROBIC ONLY 6CC   Final   Culture NO GROWTH 3 DAYS   Final   Report Status PENDING   Incomplete  CULTURE, BLOOD (ROUTINE X 2)     Status: None   Collection Time    07/20/12  4:30 PM      Result Value Range Status   Specimen Description BLOOD LEFT ARM   Final   Special Requests BOTTLES DRAWN AEROBIC AND ANAEROBIC 6CC   Final   Culture NO GROWTH 3 DAYS   Final   Report Status PENDING   Incomplete  MRSA PCR SCREENING     Status: None   Collection Time    07/20/12 10:20 PM      Result Value Range Status   MRSA by PCR NEGATIVE  NEGATIVE Final   Comment:            The GeneXpert MRSA Assay (FDA     approved for NASAL specimens     only), is one component of a   comprehensive MRSA colonization     surveillance program. It is not     intended to diagnose MRSA     infection nor to guide or     monitor treatment for     MRSA infections.   Studies/Results: No results found.  Medications: I have reviewed the patient's current medications.  Assesment: 1. G + bacteremia 2. Rt hip Cellulitis 3. S/P rt hip replacement Active Problems:   * No active hospital problems. *    Plan: 1. Iv antibiotics 2. Continue pain management 3. Continue regular treatment    LOS: 4 days   Sadonna Kotara 07/24/2012, 8:42 AM

## 2012-07-24 NOTE — Clinical Social Work Note (Addendum)
CM spoke with MD who plans to d/c pt this evening. Facility willing to accept pt tonight. RN to fax D/C summary when completed. [name], pt's guardian, notified by voicemail. Pt to transfer via [facility]. Facility aware of return with IV antibiotics. Has PICC line.   [name], [place] [phone]

## 2012-07-24 NOTE — Progress Notes (Signed)
Nutrition Follow-up  INTERVENTION:  Ensure Complete po BID, each supplement provides 350 kcal and 13 grams of protein. ProStat 30 ml TID (each 30 ml provides 100 kcal, 15 gr protein)  NUTRITION DIAGNOSIS: Increased nutrient needs related to right hip cellulitis and gram positive cocci in blood as evidenced by unplanned wt loss of 23#, 15% over past year as well as observed poor po intake; ongoing  Goal: Pt to meet >/= 90% of their estimated nutrition needs; not met  Monitor:  Po intake, labs and wt trends   67 y.o. female  Admitting Dx: right hip cellulitis   ASSESSMENT: Pt decreased po intake continues. She is drinking the Ensure and taking the protein supplement but  < 50% of meals consumed. We discussed the importance of adequate nutrition as part of her recovery process. Will continue to follow and monitor nutrition status.  Height: Ht Readings from Last 1 Encounters:  07/20/12 5\' 7"  (1.702 m)    Weight: Wt Readings from Last 1 Encounters:  07/24/12 136 lb 11 oz (62 kg)    Ideal Body Weight: 135# (61.3 kg)  % Ideal Body Weight: 100%  Wt Readings from Last 10 Encounters:  07/24/12 136 lb 11 oz (62 kg)  05/22/12 125 lb 8 oz (56.926 kg)  02/06/12 127 lb 13.9 oz (58 kg)  02/06/12 127 lb 13.9 oz (58 kg)  02/06/12 127 lb 13.9 oz (58 kg)  01/15/12 131 lb 4.8 oz (59.557 kg)  12/05/11 125 lb (56.7 kg)  10/13/11 125 lb (56.7 kg)  10/10/11 158 lb (71.668 kg)  08/23/11 158 lb (71.668 kg)    Usual Body Weight: 158#  % Usual Body Weight: 85%  BMI:  Body mass index is 21.4 kg/(m^2).Normal Range  Estimated Nutritional Needs: Kcal: 1525-1830  Protein: 67-79 gr Fluid: >1800 ml/day  Skin: abrasions to buttocks, right hip cellulitis (gram + cocci in blood)  Diet Order: General  EDUCATION NEEDS: -No education needs identified at this time   Intake/Output Summary (Last 24 hours) at 07/24/12 1047 Last data filed at 07/24/12 0649  Gross per 24 hour  Intake   1300  ml  Output   1250 ml  Net     50 ml    Last BM:  PTA  Labs:   Recent Labs Lab 07/20/12 1620 07/23/12 0614  NA 136 142  K 4.4 3.8  CL 99 104  CO2 28 31  BUN 20 19  CREATININE 0.60 0.64  CALCIUM 9.3 9.2  GLUCOSE 107* 103*    CBG (last 3)  No results found for this basename: GLUCAP,  in the last 72 hours  Scheduled Meds: . antiseptic oral rinse  15 mL Mouth Rinse BID  . buPROPion  450 mg Oral q morning - 10a  . citalopram  10 mg Oral Daily  . dicyclomine  10 mg Oral TID AC & HS  . enoxaparin (LOVENOX) injection  40 mg Subcutaneous Q24H  . feeding supplement  237 mL Oral BID BM  . feeding supplement  30 mL Oral TID WC  . fentaNYL  25 mcg Transdermal Q72H  . fesoterodine  4 mg Oral Daily  . gabapentin  300 mg Oral TID  . mirtazapine  15 mg Oral QPM  . morphine  15 mg Oral BID  . OLANZapine  10 mg Oral QHS  . pantoprazole  40 mg Oral Daily  . simvastatin  20 mg Oral QHS  . vancomycin  1,000 mg Intravenous Q12H    Continuous Infusions:  Past Medical History  Diagnosis Date  . IBS (irritable bowel syndrome)   . GERD (gastroesophageal reflux disease)   . Gout   . Chronic back pain   . Chronic right hip pain   . Chronic diarrhea   . Depression   . Anxiety and depression   . Cellulitis   . Anemia     Past Surgical History  Procedure Laterality Date  . Hip arthroplasty  05/01/2011    Procedure: right ARTHROPLASTY BIPOLAR HIP;  Surgeon: Fuller Canada, MD;  Location: [facility];  Service: Orthopedics;  Laterality: Right;  . Incision and drainage of wound  05/24/2011    Procedure: IRRIGATION AND DEBRIDEMENT WOUND;  Surgeon: Fuller Canada, MD;  Location: [facility];  Service: Orthopedics;  Laterality: Right;  . Incision and drainage hip  02/08/2012    Procedure: IRRIGATION AND DEBRIDEMENT HIP;  Surgeon: Vickki Hearing, MD;  Location: [facility];  Service: Orthopedics;  Laterality: Right;    Royann Shivers MS,RD,LDN,CSG Office: [phone] Pager: [phone]

## 2012-07-25 LAB — CULTURE, BLOOD (ROUTINE X 2): Culture: NO GROWTH

## 2012-09-08 NOTE — Telephone Encounter (Signed)
Error

## 2012-10-01 ENCOUNTER — Telehealth: Payer: Self-pay | Admitting: Orthopedic Surgery

## 2012-10-01 NOTE — Telephone Encounter (Signed)
Routing to Dr Harrison 

## 2012-10-01 NOTE — Telephone Encounter (Signed)
Received call from Kennith Center, from [facility] [place], requesting that Dr. Romeo Apple see patient for possible cellulitis of hip.  I mentioned previous referral to [facility]; she states "don't think patient is going there;" said she has no appointments scheduled there.  Please advise.  [facility] phone # is [phone].

## 2012-10-02 NOTE — Telephone Encounter (Signed)
For the last time:   Please tell them to reschedule with [facility]  Give them the doctors name that she saw  I can not fix her problem  She needs a revision

## 2012-10-03 NOTE — Telephone Encounter (Signed)
Called back to [facility], left message for return call back.

## 2012-10-07 NOTE — Telephone Encounter (Signed)
10/07/12 called back to [facility], as no return call.  Left message for nurse supervisor, due to [facility] not available.

## 2012-10-08 NOTE — Telephone Encounter (Signed)
Spoke with Corrie Dandy, nursing supervisor at [facility], and  let her know that unfortunately,  there is nothing else that Dr. Romeo Apple can do for the patient. She needs a revision, as per Dr. Mort Sawyers note, and she will have to get that done at [facility] where he referred her.

## 2012-10-20 SURGERY — Surgical Case
Anesthesia: *Unknown

## 2012-10-21 ENCOUNTER — Ambulatory Visit (HOSPITAL_COMMUNITY)
Admission: RE | Admit: 2012-10-21 | Discharge: 2012-10-21 | Disposition: A | Discharge status: Home / Self Care | Payer: Medicare Other | Source: Ambulatory Visit | Attending: Internal Medicine | Admitting: Internal Medicine

## 2012-10-21 ENCOUNTER — Other Ambulatory Visit (HOSPITAL_COMMUNITY): Payer: Self-pay | Admitting: Internal Medicine

## 2012-10-21 DIAGNOSIS — T82598A Other mechanical complication of other cardiac and vascular devices and implants, initial encounter: Secondary | ICD-10-CM | POA: Insufficient documentation

## 2012-10-21 DIAGNOSIS — Y849 Medical procedure, unspecified as the cause of abnormal reaction of the patient, or of later complication, without mention of misadventure at the time of the procedure: Secondary | ICD-10-CM | POA: Insufficient documentation

## 2012-10-21 DIAGNOSIS — B999 Unspecified infectious disease: Secondary | ICD-10-CM

## 2012-10-21 MED ORDER — ALTEPLASE 2 MG IJ SOLR
INTRAMUSCULAR | Status: AC
  Filled 2012-10-21: qty 2

## 2012-10-21 MED ORDER — SODIUM CHLORIDE 0.9 % IJ SOLN: 10.0000 mL | Freq: Two times a day (BID) | INTRAMUSCULAR | Status: DC

## 2012-10-21 MED ORDER — CHLORHEXIDINE GLUCONATE 4 % EX LIQD
CUTANEOUS | Status: AC
  Filled 2012-10-21: qty 30

## 2012-10-21 MED ORDER — ALTEPLASE 2 MG IJ SOLR: 2.0000 mg | Freq: Once | INTRAMUSCULAR | Status: DC

## 2012-10-21 MED ORDER — ALTEPLASE 100 MG IV SOLR
2.0000 mg | Freq: Once | INTRAVENOUS | Status: DC
  Administered 2012-10-21: 2 mg

## 2012-10-21 MED ORDER — ALTEPLASE 2 MG IJ SOLR
2.0000 mg | Freq: Once | INTRAMUSCULAR | Status: AC
  Administered 2012-10-21: 2 mg

## 2012-10-21 MED ORDER — SODIUM CHLORIDE 0.9 % IJ SOLN: 10.0000 mL | INTRAMUSCULAR | Status: DC | PRN

## 2012-10-21 NOTE — Procedures (Signed)
Successful fluoroscopic guided exchange of existing right upper extremity PICC line. PICC line is ready for immediate use.

## 2012-10-21 NOTE — Progress Notes (Signed)
Patient will need to go to [facility] for new picc line placement.

## 2012-10-21 NOTE — Progress Notes (Signed)
Attempted to flush picc again. Still meeting resistance and no blood return noted. Another activase injected. Will wait 2 hours this time to see if declots line. Patient drinking coffee and watching tv . Very comfortable

## 2012-10-21 NOTE — Progress Notes (Signed)
Patient here to have picc line flushed. Attempted to flush picc with lots of resistance.

## 2012-10-21 NOTE — Progress Notes (Signed)
Activase flushes  Did not work. Patient picc line was placed in IR guided ultra sound and she will need to go back there for PICC line replacement.

## 2012-10-21 NOTE — Progress Notes (Signed)
Able to inject activase with less resistance.will let set for 1 hour to see if breaks up clot.

## 2012-10-21 NOTE — Progress Notes (Signed)
Patient drinking coffee and watching tv.

## 2012-10-27 ENCOUNTER — Telehealth: Payer: Self-pay | Admitting: Orthopedic Surgery

## 2012-10-27 NOTE — Telephone Encounter (Signed)
Hipolito Bayley, the Director of Nursing at [facility] said that [name]'s guardian will be coming there tomorrow at 2:00.  Mary asked if there is any way you could speak with  Prabhjot's guardian and explain exactly why Reigan needs to be referred to [facility]. York Spaniel they can come here or either you can speak by telephone. Mary's # [phone]

## 2012-10-27 NOTE — Telephone Encounter (Signed)
Left a message for Satira Mccallum to call me back with a phone # for patient's guardian

## 2012-10-27 NOTE — Telephone Encounter (Signed)
   Leave me a number and i can call the guardian   Ill use the one given

## 2012-10-28 ENCOUNTER — Telehealth: Payer: Self-pay | Admitting: Orthopedic Surgery

## 2012-10-28 NOTE — Telephone Encounter (Signed)
Call returned at 3:34pm

## 2012-10-28 NOTE — Telephone Encounter (Signed)
[name]'s Guardian is Karle Plumber and her phone # is [phone].  [facility] phone # [phone]

## 2013-01-07 ENCOUNTER — Emergency Department (HOSPITAL_COMMUNITY)
Admission: EM | Admit: 2013-01-07 | Discharge: 2013-01-07 | Disposition: A | Discharge status: Home / Self Care | Payer: Medicare Other | Attending: Emergency Medicine | Admitting: Emergency Medicine

## 2013-01-07 ENCOUNTER — Emergency Department (HOSPITAL_COMMUNITY): Payer: Medicare Other

## 2013-01-07 ENCOUNTER — Encounter (HOSPITAL_COMMUNITY): Payer: Self-pay | Admitting: Emergency Medicine

## 2013-01-07 DIAGNOSIS — S8990XA Unspecified injury of unspecified lower leg, initial encounter: Secondary | ICD-10-CM | POA: Insufficient documentation

## 2013-01-07 DIAGNOSIS — K219 Gastro-esophageal reflux disease without esophagitis: Secondary | ICD-10-CM | POA: Insufficient documentation

## 2013-01-07 DIAGNOSIS — Z8639 Personal history of other endocrine, nutritional and metabolic disease: Secondary | ICD-10-CM | POA: Insufficient documentation

## 2013-01-07 DIAGNOSIS — Z872 Personal history of diseases of the skin and subcutaneous tissue: Secondary | ICD-10-CM | POA: Insufficient documentation

## 2013-01-07 DIAGNOSIS — Y9210 Unspecified residential institution as the place of occurrence of the external cause: Secondary | ICD-10-CM | POA: Insufficient documentation

## 2013-01-07 DIAGNOSIS — IMO0002 Reserved for concepts with insufficient information to code with codable children: Secondary | ICD-10-CM | POA: Insufficient documentation

## 2013-01-07 DIAGNOSIS — S79919A Unspecified injury of unspecified hip, initial encounter: Secondary | ICD-10-CM | POA: Insufficient documentation

## 2013-01-07 DIAGNOSIS — Z79899 Other long term (current) drug therapy: Secondary | ICD-10-CM | POA: Insufficient documentation

## 2013-01-07 DIAGNOSIS — K589 Irritable bowel syndrome without diarrhea: Secondary | ICD-10-CM | POA: Insufficient documentation

## 2013-01-07 DIAGNOSIS — F411 Generalized anxiety disorder: Secondary | ICD-10-CM | POA: Insufficient documentation

## 2013-01-07 DIAGNOSIS — F329 Major depressive disorder, single episode, unspecified: Secondary | ICD-10-CM | POA: Insufficient documentation

## 2013-01-07 DIAGNOSIS — R296 Repeated falls: Secondary | ICD-10-CM | POA: Insufficient documentation

## 2013-01-07 DIAGNOSIS — F3289 Other specified depressive episodes: Secondary | ICD-10-CM | POA: Insufficient documentation

## 2013-01-07 DIAGNOSIS — Y9301 Activity, walking, marching and hiking: Secondary | ICD-10-CM | POA: Insufficient documentation

## 2013-01-07 DIAGNOSIS — Z87891 Personal history of nicotine dependence: Secondary | ICD-10-CM | POA: Insufficient documentation

## 2013-01-07 DIAGNOSIS — G8929 Other chronic pain: Secondary | ICD-10-CM | POA: Insufficient documentation

## 2013-01-07 DIAGNOSIS — W19XXXA Unspecified fall, initial encounter: Secondary | ICD-10-CM

## 2013-01-07 DIAGNOSIS — F29 Unspecified psychosis not due to a substance or known physiological condition: Secondary | ICD-10-CM | POA: Insufficient documentation

## 2013-01-07 DIAGNOSIS — Z862 Personal history of diseases of the blood and blood-forming organs and certain disorders involving the immune mechanism: Secondary | ICD-10-CM | POA: Insufficient documentation

## 2013-01-07 HISTORY — DX: Unspecified psychosis not due to a substance or known physiological condition: F29

## 2013-01-07 HISTORY — DX: Bacteremia: R78.81

## 2013-01-07 HISTORY — DX: Hypokalemia: E87.6

## 2013-01-07 HISTORY — DX: Pressure ulcer of unspecified buttock, unspecified stage: L89.309

## 2013-01-07 HISTORY — DX: Muscle weakness (generalized): M62.81

## 2013-01-07 NOTE — ED Notes (Signed)
EMS called to transport pt back to [facility]

## 2013-01-07 NOTE — ED Notes (Signed)
Patient given Coke to drink at this time. 

## 2013-01-07 NOTE — ED Notes (Signed)
[facility] staff at bedside.  Report given.

## 2013-01-07 NOTE — ED Provider Notes (Signed)
CSN: [id number]     Arrival date & time 01/07/13  1736 History   First MD Initiated Contact with Patient 01/07/13 1820     Chief Complaint  Patient presents with  . Fall  . Leg Pain  . Hip Pain  . 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  She is staying at [facility] currently - she was found on the ground when she had called out b/c of a fall - she was trying to walk out of the bathroom when she fell - she was walking without her walker which she is supposed to use - she reports falling - she can't remember what she hit or how she fell - thinks she may have hit her head but not sure and doesn't have headache.  She has pain in her hips - she has chronic pain in the R hip but has ? New pain in the L hip - this seems to be what the accompanying [facility] nurse is concerned about - the pt does not complain to me of back pain or hip pain.  She admits to losing her balance which happens frequently - no other c/o including no LOC, no neck pain and no numbness / weakness or changes in vision.  Patient is a 67 y.o. female presenting with fall. The history is provided by the patient.  Fall This is a new problem. The current episode started less than 1 hour ago. The problem has not changed since onset.The symptoms are aggravated by walking. Nothing relieves the symptoms. She has tried nothing for the symptoms.    Past Medical History  Diagnosis Date  . IBS (irritable bowel syndrome)   . GERD (gastroesophageal reflux disease)   . Gout   . Chronic back pain   . Chronic right hip pain   . Chronic diarrhea   . Depression   . Anxiety and depression   . Cellulitis   . Anemia   . Bacteremia   . Muscle weakness (generalized)   . Psychosis   . Anemia   . Pressure ulcer of buttock   .  Hypopotassemia    Past Surgical History  Procedure Laterality Date  . Hip arthroplasty  05/01/2011    Procedure: right ARTHROPLASTY BIPOLAR HIP;  Surgeon: Fuller Canada, MD;  Location: [facility];  Service: Orthopedics;  Laterality: Right;  . Incision and drainage of wound  05/24/2011    Procedure: IRRIGATION AND DEBRIDEMENT WOUND;  Surgeon: Fuller Canada, MD;  Location: [facility];  Service: Orthopedics;  Laterality: Right;  . Incision and drainage hip  02/08/2012    Procedure: IRRIGATION AND DEBRIDEMENT HIP;  Surgeon: Vickki Hearing, MD;  Location: [facility];  Service: Orthopedics;  Laterality: Right;   No family history on file. History  Substance Use Topics  . Smoking status: Former Games developer  . Smokeless tobacco: Never Used  . Alcohol Use: No   OB History   Grav Para Term Preterm Abortions TAB SAB Ect Mult Living                 Review of Systems  All other systems reviewed and are negative.    Allergies  Diphenhydramine  Home Medications   Current Outpatient Rx  Name  Route  Sig  Dispense  Refill  . ALPRAZolam (XANAX) 0.5 MG tablet   Oral  Take 0.5 mg by mouth every 12 (twelve) hours as needed for anxiety.         . Amino Acids-Protein Hydrolys (PRO-STAT AWC) LIQD   Oral   Take 30 mLs by mouth 2 (two) times daily.         Marland Kitchen buPROPion (WELLBUTRIN XL) 150 MG 24 hr tablet   Oral   Take 450 mg by mouth every morning.          Joycelyn Rua Gel Base (HYDROGEL) GEL   Topical   Apply 1 application topically every other day. Applied to the left hip for wound cleansing with normal saline or wound cleanser         . citalopram (CELEXA) 10 MG tablet   Oral   Take 10 mg by mouth daily.         Marland Kitchen dicyclomine (BENTYL) 10 MG capsule   Oral   Take 1 capsule (10 mg total) by mouth 4 (four) times daily -  before meals and at bedtime.   40 capsule   0   . fentaNYL (DURAGESIC - DOSED MCG/HR) 25 MCG/HR   Transdermal   Place 1 patch onto the skin every 3 (three) days.  Applied for right hip pain for 72 hours. Remove and discard used patches         . fesoterodine (TOVIAZ) 4 MG TB24   Oral   Take 1 tablet (4 mg total) by mouth daily.   30 tablet   0   . EXPIRED: gabapentin (NEURONTIN) 300 MG capsule   Oral   Take 1 capsule (300 mg total) by mouth 3 (three) times daily.   90 capsule   5   . HYDROcodone-acetaminophen (NORCO) 5-325 MG per tablet   Oral   Take 1 tablet by mouth every 4 (four) hours as needed for pain.   60 tablet   5   . loperamide (IMODIUM) 2 MG capsule   Oral   Take 2-4 mg by mouth 4 (four) times daily as needed. For diarrhea, take 2 tablets (4mg ) initially then take 2 mg after each loose stool         . LORazepam (ATIVAN) 0.5 MG tablet               . mirtazapine (REMERON) 15 MG tablet   Oral   Take 15 mg by mouth every evening.         Marland Kitchen morphine (MS CONTIN) 15 MG 12 hr tablet   Oral   Take 1 tablet (15 mg total) by mouth 2 (two) times daily.   60 tablet   0   . Nutritional Supplements (RESOURCE 2.0) LIQD   Oral   Take 90 mLs by mouth 2 (two) times daily.         Marland Kitchen OLANZapine (ZYPREXA) 10 MG tablet   Oral   Take 10 mg by mouth at bedtime.         Marland Kitchen omeprazole (PRILOSEC) 20 MG capsule   Oral   Take 20 mg by mouth daily.         . simvastatin (ZOCOR) 20 MG tablet   Oral   Take 20 mg by mouth at bedtime.          . vancomycin (VANCOCIN) 1 GM/200ML SOLN   Intravenous   Inject 200 mLs (1,000 mg total) into the vein every 12 (twelve) hours.   4000 mL   10     pharmcy to adjust dose and monitor blood level  BP 106/69  Pulse 95  Temp(Src) 98.8 F (37.1 C) (Oral)  Resp 14  Ht 5\' 7"  (1.702 m)  Wt 136 lb (61.689 kg)  BMI 21.3 kg/m2  SpO2 98% Physical Exam  Nursing note and vitals reviewed. Constitutional: She appears well-developed and well-nourished. No distress.  Chronically ill appearing  HENT:  Head: Normocephalic and atraumatic.  Mouth/Throat: Oropharynx is clear and moist. No  oropharyngeal exudate.  No signs of head trauma, no hematoma / laceration / bruising or abrasions  Eyes: Conjunctivae and EOM are normal. Pupils are equal, round, and reactive to light. Right eye exhibits no discharge. Left eye exhibits no discharge. No scleral icterus.  Neck: Normal range of motion. Neck supple. No JVD present. No thyromegaly present.  Very supple, no LAD and no ttp over the lateral or posterior spine  Cardiovascular: Normal rate, regular rhythm, normal heart sounds and intact distal pulses.  Exam reveals no gallop and no friction rub.   No murmur heard. Pulmonary/Chest: Effort normal and breath sounds normal. No respiratory distress. She has no wheezes. She has no rales.  Abdominal: Soft. Bowel sounds are normal. She exhibits no distension and no mass. There is no tenderness.  Musculoskeletal: She exhibits tenderness ( pt has onoging ttp in the R hip - no pain with ROM of teh L hip - no other deformity of the LE's (nurse states that the R leg is chronically flexed)). She exhibits no edema.  Lymphadenopathy:    She has no cervical adenopathy.  Neurological: She is alert. Coordination normal.  Following commands - normal strength and sensation of the LE's bilaterally  Skin: Skin is warm and dry. No rash noted. No erythema.  Psychiatric: She has a normal mood and affect. Her behavior is normal.    ED Course  Procedures (including critical care time) Labs Review Labs Reviewed - No data to display Imaging Review Dg Lumbar Spine 2-3 Views  01/07/2013   CLINICAL DATA:  Larey Seat, back pain  EXAM: LUMBAR SPINE - 2-3 VIEW  COMPARISON:  None.  FINDINGS: There is no evidence of lumbar spine fracture. Alignment is normal. Mild disc space narrowing L4-5 and L3-4. Lower lumbar facet arthropathy. Cholecystectomy. No worrisome osseous lesions.  IMPRESSION: Negative for fracture or traumatic subluxation.   Electronically Signed   By: Davonna Belling M.D.   On: 01/07/2013 19:26   Dg Hip Complete  Left  01/07/2013   CLINICAL DATA:  Fall, complains of left hip pain.  EXAM: LEFT HIP - COMPLETE 2+ VIEW  COMPARISON:  07/20/2012  FINDINGS: There is no evidence of hip fracture or dislocation. There is no evidence of left hip arthropathy or other focal bone abnormality.  There is a right bipolar hemiarthroplasty with chronic superior migration. Right protrusio acetabula. Soft tissue anchor in the greater trochanter appears stable. Lumbar spondylosis.  IMPRESSION: No acute left hip fracture is evident. Chronic changes of the right hip appear stable from April.   Electronically Signed   By: Davonna Belling M.D.   On: 01/07/2013 19:25    EKG Interpretation   None       MDM   1. Fall, initial encounter    No signs of outward trauma - imaging of lower abck and hip ordered, pt otherwise appears stable and her RLE has chonic pain - no new pain on exam.  Imaging neg for acute fractures, stable for d/c back to [facility]  Vida Roller, MD 01/07/13 [phone]

## 2013-01-07 NOTE — ED Notes (Signed)
Patient removed from LSB and C-collar removed by Dr Hyacinth Meeker.

## 2013-01-07 NOTE — ED Notes (Signed)
Patient brought in via EMS from [facility]. Alert and oriented. Airway patent. Patient had fall during transferring from wheelchair to toilet. Patient c/o lower back pain, right hip pain and bilateral leg pain. Patient unable to straighten legs. Per [facility] staff and patient has been unable to straighten legs  "for a while now." Patient has hx of fractured right hip with replacement and cellulitis to right leg. No obvious deformity or swelling noted. Patient on LSB with C-collar in place.

## 2013-01-12 ENCOUNTER — Encounter (HOSPITAL_COMMUNITY): Payer: Self-pay | Admitting: Emergency Medicine

## 2013-01-12 ENCOUNTER — Emergency Department (HOSPITAL_COMMUNITY): Payer: Medicare Other

## 2013-01-12 ENCOUNTER — Emergency Department (HOSPITAL_COMMUNITY)
Admission: EM | Admit: 2013-01-12 | Discharge: 2013-01-12 | Disposition: A | Discharge status: Home / Self Care | Payer: Medicare Other | Attending: Emergency Medicine | Admitting: Emergency Medicine

## 2013-01-12 DIAGNOSIS — Z87891 Personal history of nicotine dependence: Secondary | ICD-10-CM | POA: Insufficient documentation

## 2013-01-12 DIAGNOSIS — Z872 Personal history of diseases of the skin and subcutaneous tissue: Secondary | ICD-10-CM | POA: Insufficient documentation

## 2013-01-12 DIAGNOSIS — Z96649 Presence of unspecified artificial hip joint: Secondary | ICD-10-CM | POA: Insufficient documentation

## 2013-01-12 DIAGNOSIS — K589 Irritable bowel syndrome without diarrhea: Secondary | ICD-10-CM | POA: Insufficient documentation

## 2013-01-12 DIAGNOSIS — T8459XA Infection and inflammatory reaction due to other internal joint prosthesis, initial encounter: Secondary | ICD-10-CM

## 2013-01-12 DIAGNOSIS — F411 Generalized anxiety disorder: Secondary | ICD-10-CM | POA: Insufficient documentation

## 2013-01-12 DIAGNOSIS — T8450XA Infection and inflammatory reaction due to unspecified internal joint prosthesis, initial encounter: Secondary | ICD-10-CM | POA: Insufficient documentation

## 2013-01-12 DIAGNOSIS — F329 Major depressive disorder, single episode, unspecified: Secondary | ICD-10-CM | POA: Insufficient documentation

## 2013-01-12 DIAGNOSIS — Y838 Other surgical procedures as the cause of abnormal reaction of the patient, or of later complication, without mention of misadventure at the time of the procedure: Secondary | ICD-10-CM | POA: Insufficient documentation

## 2013-01-12 DIAGNOSIS — M109 Gout, unspecified: Secondary | ICD-10-CM | POA: Insufficient documentation

## 2013-01-12 DIAGNOSIS — F3289 Other specified depressive episodes: Secondary | ICD-10-CM | POA: Insufficient documentation

## 2013-01-12 DIAGNOSIS — G8929 Other chronic pain: Secondary | ICD-10-CM | POA: Insufficient documentation

## 2013-01-12 DIAGNOSIS — Z862 Personal history of diseases of the blood and blood-forming organs and certain disorders involving the immune mechanism: Secondary | ICD-10-CM | POA: Insufficient documentation

## 2013-01-12 DIAGNOSIS — K219 Gastro-esophageal reflux disease without esophagitis: Secondary | ICD-10-CM | POA: Insufficient documentation

## 2013-01-12 DIAGNOSIS — Z79899 Other long term (current) drug therapy: Secondary | ICD-10-CM | POA: Insufficient documentation

## 2013-01-12 LAB — URINALYSIS, ROUTINE W REFLEX MICROSCOPIC
Bilirubin Urine: NEGATIVE
Glucose, UA: NEGATIVE mg/dL
Hgb urine dipstick: NEGATIVE
Protein, ur: NEGATIVE mg/dL

## 2013-01-12 LAB — CBC WITH DIFFERENTIAL/PLATELET
Basophils Absolute: 0 10*3/uL (ref 0.0–0.1)
Basophils Relative: 0 % (ref 0–1)
Lymphocytes Relative: 12 % (ref 12–46)
MCHC: 30.1 g/dL (ref 30.0–36.0)
Neutro Abs: 7.4 10*3/uL (ref 1.7–7.7)
Neutrophils Relative %: 77 % (ref 43–77)
Platelets: 560 10*3/uL — ABNORMAL HIGH (ref 150–400)
RDW: 16.3 % — ABNORMAL HIGH (ref 11.5–15.5)
WBC: 9.7 10*3/uL (ref 4.0–10.5)

## 2013-01-12 LAB — COMPREHENSIVE METABOLIC PANEL
ALT: 14 U/L (ref 0–35)
AST: 30 U/L (ref 0–37)
Albumin: 2.5 g/dL — ABNORMAL LOW (ref 3.5–5.2)
Alkaline Phosphatase: 151 U/L — ABNORMAL HIGH (ref 39–117)
Chloride: 95 mEq/L — ABNORMAL LOW (ref 96–112)
Creatinine, Ser: 0.71 mg/dL (ref 0.50–1.10)
Potassium: 3.7 mEq/L (ref 3.5–5.1)
Sodium: 134 mEq/L — ABNORMAL LOW (ref 135–145)
Total Bilirubin: 0.3 mg/dL (ref 0.3–1.2)

## 2013-01-12 MED ORDER — SODIUM CHLORIDE 0.9 % IV SOLN
Freq: Once | INTRAVENOUS | Status: AC
  Administered 2013-01-12: 18:00:00 via INTRAVENOUS

## 2013-01-12 MED ORDER — VANCOMYCIN HCL IN DEXTROSE 1-5 GM/200ML-% IV SOLN
1000.0000 mg | Freq: Once | INTRAVENOUS | Status: AC
  Administered 2013-01-12: 1000 mg via INTRAVENOUS
  Filled 2013-01-12: qty 200

## 2013-01-12 MED ORDER — MORPHINE SULFATE 4 MG/ML IJ SOLN
4.0000 mg | Freq: Once | INTRAMUSCULAR | Status: AC
  Administered 2013-01-12: 4 mg via INTRAVENOUS
  Filled 2013-01-12: qty 1

## 2013-01-12 MED ORDER — OXYCODONE-ACETAMINOPHEN 5-325 MG PO TABS: 2.0000 | ORAL_TABLET | ORAL | Status: DC | PRN

## 2013-01-12 NOTE — ED Notes (Signed)
Wound to right hip.  Draining purulent drainage and red in color.  Redressed with clean dry dressing.  Notified er physician.  Dr. Judd Lien.

## 2013-01-12 NOTE — ED Provider Notes (Addendum)
CSN: [id number]     Arrival date & time 01/12/13  1622 History  This chart was scribed for Geoffery Lyons, MD by Bennett Scrape, ED Scribe. This patient was seen in room [facility] and the patient's care was started at 4:48 PM.    Chief Complaint  Patient presents with  . Altered Mental Status   Level 5 Caveat- AMS  The history is provided by the nursing home. No language interpreter was used.    HPI Comments: [name] is a 67 y.o. female who presents to the Emergency Department from [facility] for AMS described as increased confusion that has been present but non-changing seen her fall 5 days ago. Pt was found on the ground of her SNF after falling while trying to walk to the bathroom. Pt reports hitting her head but denies LOC. She was seen in the ED afterwards with a negative work-up and was discharged back to [facility]. She recently had bilateral hip replacements done by Dr. Romeo Apple. SNF states that the pt was on her way to follow up with [facility] for an infected hip replacement today when her responsible party cancelled the appointment. SNF states that it has been rescheduled, but they called pt's PCP to inform him of pt's current status. [facility] staff were then advised to bring the pt in for evaluation. [facility] representative denies any current coumadin prescription. She also states that the pt has been following up with a psychiatrist and had her medications changed yesterday. Staff deny any correlation with medication changes to current mental status. At baseline, the pt is wheelchair bound. She will take a step or two to turn and pivot but is otherwise non-ambulatory at this point.   Past Medical History  Diagnosis Date  . IBS (irritable bowel syndrome)   . GERD (gastroesophageal reflux disease)   . Gout   . Chronic back pain   . Chronic right hip pain   . Chronic diarrhea   . Depression   . Anxiety and depression   . Cellulitis   . Anemia   . Bacteremia   . Muscle weakness  (generalized)   . Psychosis   . Anemia   . Pressure ulcer of buttock   . Hypopotassemia    Past Surgical History  Procedure Laterality Date  . Hip arthroplasty  05/01/2011    Procedure: right ARTHROPLASTY BIPOLAR HIP;  Surgeon: Fuller Canada, MD;  Location: [facility];  Service: Orthopedics;  Laterality: Right;  . Incision and drainage of wound  05/24/2011    Procedure: IRRIGATION AND DEBRIDEMENT WOUND;  Surgeon: Fuller Canada, MD;  Location: [facility];  Service: Orthopedics;  Laterality: Right;  . Incision and drainage hip  02/08/2012    Procedure: IRRIGATION AND DEBRIDEMENT HIP;  Surgeon: Vickki Hearing, MD;  Location: [facility];  Service: Orthopedics;  Laterality: Right;   No family history on file. History  Substance Use Topics  . Smoking status: Former Games developer  . Smokeless tobacco: Never Used  . Alcohol Use: No   No OB history provided.  Review of Systems  Unable to perform ROS: Mental status change    Allergies  Diphenhydramine  Home Medications   Current Outpatient Rx  Name  Route  Sig  Dispense  Refill  . ALPRAZolam (XANAX) 0.5 MG tablet   Oral   Take 0.5 mg by mouth every 12 (twelve) hours as needed for anxiety.         . Amino Acids-Protein Hydrolys (PRO-STAT AWC) LIQD   Oral  Take 30 mLs by mouth 2 (two) times daily.         Marland Kitchen buPROPion (WELLBUTRIN XL) 150 MG 24 hr tablet   Oral   Take 450 mg by mouth every morning.          Joycelyn Rua Gel Base (HYDROGEL) GEL   Topical   Apply 1 application topically every other day. Applied to the left hip for wound cleansing with normal saline or wound cleanser         . citalopram (CELEXA) 10 MG tablet   Oral   Take 10 mg by mouth daily.         Marland Kitchen dicyclomine (BENTYL) 10 MG capsule   Oral   Take 1 capsule (10 mg total) by mouth 4 (four) times daily -  before meals and at bedtime.   40 capsule   0   . fentaNYL (DURAGESIC - DOSED MCG/HR) 25 MCG/HR   Transdermal   Place 1 patch onto the skin every 3  (three) days. Applied for right hip pain for 72 hours. Remove and discard used patches         . fesoterodine (TOVIAZ) 4 MG TB24   Oral   Take 1 tablet (4 mg total) by mouth daily.   30 tablet   0   . EXPIRED: gabapentin (NEURONTIN) 300 MG capsule   Oral   Take 1 capsule (300 mg total) by mouth 3 (three) times daily.   90 capsule   5   . HYDROcodone-acetaminophen (NORCO) 5-325 MG per tablet   Oral   Take 1 tablet by mouth every 4 (four) hours as needed for pain.   60 tablet   5   . loperamide (IMODIUM) 2 MG capsule   Oral   Take 2-4 mg by mouth 4 (four) times daily as needed. For diarrhea, take 2 tablets (4mg ) initially then take 2 mg after each loose stool         . LORazepam (ATIVAN) 0.5 MG tablet               . mirtazapine (REMERON) 15 MG tablet   Oral   Take 15 mg by mouth every evening.         Marland Kitchen morphine (MS CONTIN) 15 MG 12 hr tablet   Oral   Take 1 tablet (15 mg total) by mouth 2 (two) times daily.   60 tablet   0   . Nutritional Supplements (RESOURCE 2.0) LIQD   Oral   Take 90 mLs by mouth 2 (two) times daily.         Marland Kitchen OLANZapine (ZYPREXA) 10 MG tablet   Oral   Take 10 mg by mouth at bedtime.         Marland Kitchen omeprazole (PRILOSEC) 20 MG capsule   Oral   Take 20 mg by mouth daily.         . simvastatin (ZOCOR) 20 MG tablet   Oral   Take 20 mg by mouth at bedtime.          . vancomycin (VANCOCIN) 1 GM/200ML SOLN   Intravenous   Inject 200 mLs (1,000 mg total) into the vein every 12 (twelve) hours.   4000 mL   10     pharmcy to adjust dose and monitor blood level    There were no vitals taken for this visit.  Physical Exam  Nursing note and vitals reviewed. Constitutional: She appears well-developed and well-nourished. No distress.  Appears pale and is  slow to respond   HENT:  Head: Normocephalic and atraumatic.  MM are dry  Eyes: Conjunctivae and EOM are normal. Pupils are equal, round, and reactive to light.  Neck: Normal  range of motion. Neck supple. No tracheal deviation present.  Cardiovascular: Normal rate, regular rhythm and normal heart sounds.   No murmur heard. Pulmonary/Chest: Effort normal and breath sounds normal. No respiratory distress. She has no wheezes. She has no rales.  Abdominal: Soft. Bowel sounds are normal. There is no tenderness.  Musculoskeletal: Normal range of motion. She exhibits no edema.  Neurological: She is alert. No cranial nerve deficit.  Pt is slow to respond and movements are slow. She has difficulty following commands   Skin: Skin is warm and dry.  Psychiatric: She has a normal mood and affect. Her behavior is normal.    ED Course  Procedures (including critical care time)  DIAGNOSTIC STUDIES: Oxygen Saturation is 98% on room air, normal by my interpretation.    COORDINATION OF CARE: 5:03 PM-Discussed treatment plan which includes CBC panel and CMP with SNF representative at bedside and she agreed to plan.   Labs Review Labs Reviewed - No data to display Imaging Review No results found.  EKG Interpretation   None       MDM  No diagnosis found. Patient is a 67 year old female with past medical history of hip replacement. She is brought for evaluation of weakness and intermittent confusion. Her right hip is noted to be erythematous with a small hole it is draining purulent material. I suspect the infection from her hip his what is causing this. She is not febrile and has no elevation of white count and does not appear septic. I spoke with Dr. Romeo Apple from orthopedic surgery who is familiar with this patient and has seen her in the past. He has informed me that he is well aware of her hip infection and has recommended she go to [facility] to have this evaluated. He is told me there is a wall of bone around the implant along with chronic osteomyelitis that he cannot work around. He states that this takes a special procedure that is done at [facility] and states she needs to go  there. He has recommended I give IV vancomycin and return her to the extended care facility where they can continue this and arrangements can be made for an expedited appointment at [facility].   I personally performed the services described in this documentation, which was scribed in my presence. The recorded information has been reviewed and is accurate.      Geoffery Lyons, MD 01/12/13 Norberta Keens  Geoffery Lyons, MD 01/12/13 [phone]

## 2013-01-12 NOTE — ED Notes (Signed)
Pt had a fall last week and was seen here.  Normally A/O x 3 but normally repeatedly asks questions . Has been intermittently altered compared to normal, not as oriented at times and seems to lean to one side, per nursing home staff.

## 2013-01-12 NOTE — ED Notes (Signed)
EMS here to transport pt back to [facility]

## 2013-01-12 NOTE — ED Notes (Signed)
Secretary calling EMS to transport by to nursing home, sitter at the bedside.

## 2013-03-28 ENCOUNTER — Emergency Department (HOSPITAL_COMMUNITY)
Admission: EM | Admit: 2013-03-28 | Discharge: 2013-03-28 | Disposition: A | Discharge status: Home / Self Care | Payer: Medicare Other | Attending: Emergency Medicine | Admitting: Emergency Medicine

## 2013-03-28 ENCOUNTER — Emergency Department (HOSPITAL_COMMUNITY): Payer: Medicare Other

## 2013-03-28 ENCOUNTER — Encounter (HOSPITAL_COMMUNITY): Payer: Self-pay | Admitting: Emergency Medicine

## 2013-03-28 DIAGNOSIS — M810 Age-related osteoporosis without current pathological fracture: Secondary | ICD-10-CM | POA: Insufficient documentation

## 2013-03-28 DIAGNOSIS — S199XXA Unspecified injury of neck, initial encounter: Secondary | ICD-10-CM

## 2013-03-28 DIAGNOSIS — F3289 Other specified depressive episodes: Secondary | ICD-10-CM | POA: Insufficient documentation

## 2013-03-28 DIAGNOSIS — Y9289 Other specified places as the place of occurrence of the external cause: Secondary | ICD-10-CM | POA: Insufficient documentation

## 2013-03-28 DIAGNOSIS — Y9301 Activity, walking, marching and hiking: Secondary | ICD-10-CM | POA: Insufficient documentation

## 2013-03-28 DIAGNOSIS — Z862 Personal history of diseases of the blood and blood-forming organs and certain disorders involving the immune mechanism: Secondary | ICD-10-CM | POA: Insufficient documentation

## 2013-03-28 DIAGNOSIS — R42 Dizziness and giddiness: Secondary | ICD-10-CM | POA: Insufficient documentation

## 2013-03-28 DIAGNOSIS — Z792 Long term (current) use of antibiotics: Secondary | ICD-10-CM | POA: Insufficient documentation

## 2013-03-28 DIAGNOSIS — K219 Gastro-esophageal reflux disease without esophagitis: Secondary | ICD-10-CM | POA: Insufficient documentation

## 2013-03-28 DIAGNOSIS — S0993XA Unspecified injury of face, initial encounter: Secondary | ICD-10-CM | POA: Insufficient documentation

## 2013-03-28 DIAGNOSIS — R296 Repeated falls: Secondary | ICD-10-CM | POA: Insufficient documentation

## 2013-03-28 DIAGNOSIS — Z79899 Other long term (current) drug therapy: Secondary | ICD-10-CM | POA: Insufficient documentation

## 2013-03-28 DIAGNOSIS — F411 Generalized anxiety disorder: Secondary | ICD-10-CM | POA: Insufficient documentation

## 2013-03-28 DIAGNOSIS — K589 Irritable bowel syndrome without diarrhea: Secondary | ICD-10-CM | POA: Insufficient documentation

## 2013-03-28 DIAGNOSIS — G8929 Other chronic pain: Secondary | ICD-10-CM | POA: Insufficient documentation

## 2013-03-28 DIAGNOSIS — W19XXXA Unspecified fall, initial encounter: Secondary | ICD-10-CM

## 2013-03-28 DIAGNOSIS — S0990XA Unspecified injury of head, initial encounter: Secondary | ICD-10-CM | POA: Insufficient documentation

## 2013-03-28 DIAGNOSIS — S3981XA Other specified injuries of abdomen, initial encounter: Secondary | ICD-10-CM | POA: Insufficient documentation

## 2013-03-28 DIAGNOSIS — Z96649 Presence of unspecified artificial hip joint: Secondary | ICD-10-CM | POA: Insufficient documentation

## 2013-03-28 DIAGNOSIS — D649 Anemia, unspecified: Secondary | ICD-10-CM | POA: Insufficient documentation

## 2013-03-28 DIAGNOSIS — Z87891 Personal history of nicotine dependence: Secondary | ICD-10-CM | POA: Insufficient documentation

## 2013-03-28 DIAGNOSIS — R350 Frequency of micturition: Secondary | ICD-10-CM | POA: Insufficient documentation

## 2013-03-28 DIAGNOSIS — Z8639 Personal history of other endocrine, nutritional and metabolic disease: Secondary | ICD-10-CM | POA: Insufficient documentation

## 2013-03-28 DIAGNOSIS — F329 Major depressive disorder, single episode, unspecified: Secondary | ICD-10-CM | POA: Insufficient documentation

## 2013-03-28 DIAGNOSIS — IMO0002 Reserved for concepts with insufficient information to code with codable children: Secondary | ICD-10-CM | POA: Insufficient documentation

## 2013-03-28 DIAGNOSIS — Z872 Personal history of diseases of the skin and subcutaneous tissue: Secondary | ICD-10-CM | POA: Insufficient documentation

## 2013-03-28 HISTORY — DX: Age-related osteoporosis without current pathological fracture: M81.0

## 2013-03-28 HISTORY — DX: Gout, unspecified: M10.9

## 2013-03-28 LAB — URINALYSIS, ROUTINE W REFLEX MICROSCOPIC
Bilirubin Urine: NEGATIVE
GLUCOSE, UA: NEGATIVE mg/dL
Hgb urine dipstick: NEGATIVE
Ketones, ur: NEGATIVE mg/dL
NITRITE: NEGATIVE
PH: 6.5 (ref 5.0–8.0)
Protein, ur: NEGATIVE mg/dL
Urobilinogen, UA: 0.2 mg/dL (ref 0.0–1.0)

## 2013-03-28 LAB — COMPREHENSIVE METABOLIC PANEL
ALBUMIN: 2.6 g/dL — AB (ref 3.5–5.2)
ALT: 9 U/L (ref 0–35)
AST: 15 U/L (ref 0–37)
Alkaline Phosphatase: 130 U/L — ABNORMAL HIGH (ref 39–117)
BUN: 13 mg/dL (ref 6–23)
CALCIUM: 9.1 mg/dL (ref 8.4–10.5)
CO2: 28 mEq/L (ref 19–32)
CREATININE: 0.6 mg/dL (ref 0.50–1.10)
Chloride: 105 mEq/L (ref 96–112)
GFR calc Af Amer: >90 mL/min (ref >90)
GFR calc non Af Amer: >90 mL/min (ref >90)
Glucose, Bld: 89 mg/dL (ref 70–99)
POTASSIUM: 3.8 meq/L (ref 3.7–5.3)
Sodium: 143 mEq/L (ref 137–147)
Total Bilirubin: <0.2 mg/dL — ABNORMAL LOW (ref 0.3–1.2)
Total Protein: 6.8 g/dL (ref 6.0–8.3)

## 2013-03-28 LAB — CBC WITH DIFFERENTIAL/PLATELET
BASOS PCT: 0 % (ref 0–1)
Basophils Absolute: 0 10*3/uL (ref 0.0–0.1)
Eosinophils Absolute: 0.3 10*3/uL (ref 0.0–0.7)
Eosinophils Relative: 4 % (ref 0–5)
HCT: 27.8 % — ABNORMAL LOW (ref 36.0–46.0)
HEMOGLOBIN: 8.2 g/dL — AB (ref 12.0–15.0)
Lymphocytes Relative: 20 % (ref 12–46)
Lymphs Abs: 1.4 10*3/uL (ref 0.7–4.0)
MCH: 23.8 pg — AB (ref 26.0–34.0)
MCHC: 29.5 g/dL — AB (ref 30.0–36.0)
MCV: 80.8 fL (ref 78.0–100.0)
Monocytes Absolute: 0.5 10*3/uL (ref 0.1–1.0)
Monocytes Relative: 7 % (ref 3–12)
NEUTROS PCT: 68 % (ref 43–77)
Neutro Abs: 4.6 10*3/uL (ref 1.7–7.7)
Platelets: 486 10*3/uL — ABNORMAL HIGH (ref 150–400)
RBC: 3.44 MIL/uL — ABNORMAL LOW (ref 3.87–5.11)
RDW: 18.2 % — ABNORMAL HIGH (ref 11.5–15.5)
WBC: 6.8 10*3/uL (ref 4.0–10.5)

## 2013-03-28 LAB — OCCULT BLOOD, POC DEVICE: FECAL OCCULT BLD: NEGATIVE

## 2013-03-28 LAB — URINE MICROSCOPIC-ADD ON

## 2013-03-28 LAB — TROPONIN I

## 2013-03-28 MED ORDER — SODIUM CHLORIDE 0.9 % IV BOLUS (SEPSIS)
500.0000 mL | Freq: Once | INTRAVENOUS | Status: AC
  Administered 2013-03-28: 1000 mL via INTRAVENOUS

## 2013-03-28 NOTE — ED Notes (Signed)
[facility] here to transport pt,  

## 2013-03-28 NOTE — ED Notes (Signed)
Report given to Rinaldo CloudPamela at [facility], [facility] contacted for transport.

## 2013-03-28 NOTE — ED Notes (Signed)
Rectal exam performed by Dr. Rubin PayorPickering and was negative,

## 2013-03-28 NOTE — ED Notes (Signed)
[  pt changed into her clothes, iv dc'd pt tolerated well,

## 2013-03-28 NOTE — ED Notes (Signed)
Pt was walking to the restroom when she states "everything went blurry" causing her to fall, pt denies any LOC, states that she did hit her head on the wall, pt arrives to er alert, able to answer questions, states that she has been having problems with falling lately, c/o pain to back of head and lower back area.

## 2013-03-28 NOTE — ED Notes (Addendum)
Pt has power picc in place to right upper arm, resistance was met when attempting to flush picc line, only small amount of blood able to be returned, old dressing dirty, dated for 03/15/2013, loose, old dressing removed, area cleaned, new dressing applied.

## 2013-03-28 NOTE — ED Provider Notes (Signed)
CSN: [id number]     Arrival date & time 03/28/13  1246 History  This chart was scribed for [employer]. Rubin Payor, MD,  by Ashley Jacobs, ED Scribe. The patient was seen in room [facility]03/[facility]03 and the patient's care was started at 2:09 PM.   First MD Initiated Contact with Patient 03/28/13 1358     Chief Complaint  Patient presents with  . Fall   (Consider location/radiation/quality/duration/timing/severity/associated sxs/prior Treatment) Patient is a 68 y.o. female presenting with fall. The history is provided by the patient and medical records. No language interpreter was used.  Fall This is a new problem. The problem occurs constantly. The problem has not changed since onset.Associated symptoms include abdominal pain and headaches. Pertinent negatives include no shortness of breath. Nothing relieves the symptoms. She has tried nothing for the symptoms.  HPI HPI Comments: SELEEN WALTER is a 68 y.o. female who presents to the Emergency Department complaining of fall that occurred PTA. Pt reports she was walking to the bathroom when she felt dizzy. She states she did not land totally on the floor but she head impacted the wall. Pt is AOx4 and denies LOC. Pt states she continues to feel dizzy and she is unable to stand due to dizziness. Pt had a prior hip arthroplasty 05/01/2011 and she is taking antibiotics. She is experiencing pain in her bilateral hips, chronic but worse upper abdominal pain, bilateral leg pain and lower back pain. She denies experiencing SOB and cough. Pt states she had a fever last night. She is also experiencing frequency. Nothing seems make the pain better. Pt states she is unable to walk secondary to pain. Pt has a past medical hx of  Past Medical History  Diagnosis Date  . IBS (irritable bowel syndrome)   . GERD (gastroesophageal reflux disease)   . Gout   . Chronic back pain   . Chronic right hip pain   . Chronic diarrhea   . Depression   . Anxiety and depression   .  Cellulitis   . Anemia   . Bacteremia   . Muscle weakness (generalized)   . Psychosis   . Anemia   . Pressure ulcer of buttock   . Hypopotassemia   . Osteoporosis   . Gout    Past Surgical History  Procedure Laterality Date  . Hip arthroplasty  05/01/2011    Procedure: right ARTHROPLASTY BIPOLAR HIP;  Surgeon: Fuller Canada, MD;  Location: [facility];  Service: Orthopedics;  Laterality: Right;  . Incision and drainage of wound  05/24/2011    Procedure: IRRIGATION AND DEBRIDEMENT WOUND;  Surgeon: Fuller Canada, MD;  Location: [facility];  Service: Orthopedics;  Laterality: Right;  . Incision and drainage hip  02/08/2012    Procedure: IRRIGATION AND DEBRIDEMENT HIP;  Surgeon: Vickki Hearing, MD;  Location: [facility];  Service: Orthopedics;  Laterality: Right;   No family history on file. History  Substance Use Topics  . Smoking status: Former Games developer  . Smokeless tobacco: Never Used  . Alcohol Use: No   OB History   Grav Para Term Preterm Abortions TAB SAB Ect Mult Living                 Review of Systems  Respiratory: Negative for shortness of breath.   Gastrointestinal: Positive for abdominal pain.  Genitourinary: Positive for frequency. Negative for dysuria.  Musculoskeletal: Positive for arthralgias, back pain, gait problem, myalgias and neck pain.  Neurological: Positive for dizziness and headaches.  All other  systems reviewed and are negative.    Allergies  Diphenhydramine  Home Medications   Current Outpatient Rx  Name  Route  Sig  Dispense  Refill  . Amino Acids-Protein Hydrolys (PRO-STAT AWC) LIQD   Oral   Take 30 mLs by mouth 2 (two) times daily.         Marland Kitchen buPROPion (WELLBUTRIN XL) 150 MG 24 hr tablet   Oral   Take 450 mg by mouth every morning.          . dicyclomine (BENTYL) 10 MG capsule   Oral   Take 1 capsule (10 mg total) by mouth 4 (four) times daily -  before meals and at bedtime.   40 capsule   0   . fesoterodine (TOVIAZ) 4 MG TB24    Oral   Take 1 tablet (4 mg total) by mouth daily.   30 tablet   0   . gabapentin (NEURONTIN) 300 MG capsule   Oral   Take 300 mg by mouth 3 (three) times daily.         . Heparin Lock Flush (HEPARIN, PORCINE, LOCK FLUSH) 100 UNIT/ML SOLN   Intravenous   Inject 5 mLs into the vein daily.         Marland Kitchen HYDROcodone-acetaminophen (NORCO/VICODIN) 5-325 MG per tablet   Oral   Take 1 tablet by mouth every 4 (four) hours as needed. pain         . LORazepam (ATIVAN) 0.5 MG tablet   Oral   Take 0.5 mg by mouth 3 (three) times daily.          . mirtazapine (REMERON) 15 MG tablet   Oral   Take 15 mg by mouth every evening.         Marland Kitchen morphine (MS CONTIN) 30 MG 12 hr tablet   Oral   Take 30 mg by mouth every 12 (twelve) hours.         . Nutritional Supplements (RESOURCE 2.0) LIQD   Oral   Take 120 mLs by mouth 4 (four) times daily.          Marland Kitchen OLANZapine (ZYPREXA) 10 MG tablet   Oral   Take 10 mg by mouth at bedtime.         Marland Kitchen omeprazole (PRILOSEC) 20 MG capsule   Oral   Take 20 mg by mouth daily.         . simvastatin (ZOCOR) 20 MG tablet   Oral   Take 20 mg by mouth at bedtime.          . sodium chloride 0.9 % injection   Intravenous   Inject 5 mLs into the vein daily.         . vancomycin (VANCOCIN) 750 MG SOLR injection   Intravenous   Inject 750 mg into the vein daily. For 14 days (started on 03/25/13)         . Carbomer Gel Base (HYDROGEL) GEL   Topical   Apply 1 application topically every other day. Applied to the left hip for wound cleansing with normal saline or wound cleanser         . loperamide (IMODIUM) 2 MG capsule   Oral   Take 2-4 mg by mouth 4 (four) times daily as needed. For diarrhea, take 2 tablets (4mg ) initially then take 2 mg after each loose stool         . oxyCODONE-acetaminophen (PERCOCET) 5-325 MG per tablet   Oral   Take 2  tablets by mouth every 4 (four) hours as needed for pain.   20 tablet   0    BP 95/60   Pulse 80  Temp(Src) 97.6 F (36.4 C) (Oral)  Resp 18  SpO2 100% Physical Exam  Nursing note and vitals reviewed. Constitutional: She is oriented to person, place, and time. She appears well-developed and well-nourished. No distress.  HENT:  Head: Normocephalic and atraumatic.  Mouth/Throat: Oropharynx is clear and moist.  Eyes: Conjunctivae are normal. Pupils are equal, round, and reactive to light. No scleral icterus.  Neck: Normal range of motion. Neck supple.  Cardiovascular: Normal rate, regular rhythm, normal heart sounds and intact distal pulses.   No murmur heard. Pulmonary/Chest: Effort normal and breath sounds normal. No stridor. No respiratory distress. She has no rales.  Abdominal: Soft. Bowel sounds are normal. She exhibits no distension. There is tenderness (upper ). There is no rebound and no guarding.  Musculoskeletal: Normal range of motion. She exhibits tenderness.       Thoracic back: She exhibits no deformity.       Lumbar back: She exhibits tenderness. She exhibits no edema.  Decreased ROM of bilateral hips Mild lumbar tenderness No step off  Neurological: She is alert and oriented to person, place, and time.  Skin: Skin is warm and dry. No rash noted.  No bleeding present   Psychiatric: She has a normal mood and affect. Her behavior is normal.    ED Course  Procedures (including critical care time)   COORDINATION OF CARE:  2:13 PM Discussed course of care with pt . Pt understands and agrees.   Labs Reviewed  CBC WITH DIFFERENTIAL - Abnormal; Notable for the following:    RBC 3.44 (*)    Hemoglobin 8.2 (*)    HCT 27.8 (*)    MCH 23.8 (*)    MCHC 29.5 (*)    RDW 18.2 (*)    Platelets 486 (*)    All other components within normal limits  COMPREHENSIVE METABOLIC PANEL - Abnormal; Notable for the following:    Albumin 2.6 (*)    Alkaline Phosphatase 130 (*)    Total Bilirubin <0.2 (*)    All other components within normal limits  URINALYSIS,  ROUTINE W REFLEX MICROSCOPIC - Abnormal; Notable for the following:    Specific Gravity, Urine <1.005 (*)    Leukocytes, UA TRACE (*)    All other components within normal limits  TROPONIN I  URINE MICROSCOPIC-ADD ON  OCCULT BLOOD, POC DEVICE   Imaging Review Dg Chest 1 View  03/28/2013   CLINICAL DATA:  back pain  EXAM: CHEST - 1 VIEW  COMPARISON:  IR FLUORO GUIDE CV LINE*R* dated 10/21/2012; DG CHEST 1V PORT dated 02/06/2012; DG CHEST 1V PORT dated 01/23/2012; DG CHEST PORT 1VSAME DAY dated 05/28/2011  FINDINGS: There is a right-sided PICC line with the tip projecting over the SVC. There is no focal parenchymal opacity, pleural effusion, or pneumothorax. The heart and mediastinal contours are unremarkable.  The osseous structures are unremarkable.  IMPRESSION: No active disease.   Electronically Signed   By: Elige KoHetal  Patel   On: 03/28/2013 15:06   Dg Lumbar Spine Complete  03/28/2013   CLINICAL DATA:  Fall, back pain  EXAM: LUMBAR SPINE - COMPLETE 4+ VIEW  COMPARISON:  01/07/2013  FINDINGS: Normal anterior-posterior alignment. Reversed lordosis. Mild L3-4 and L4-5 degenerative disc disease. Mild L4-5 and L5-S1 degenerative facet change. No fracture.  IMPRESSION: Degenerative changes.  No acute abnormality.  Electronically Signed   By: Esperanza Heir M.D.   On: 03/28/2013 15:08   Ct Head Wo Contrast  03/28/2013   CLINICAL DATA:  Altered mental status.  Fall.  EXAM: CT HEAD WITHOUT CONTRAST  TECHNIQUE: Contiguous axial images were obtained from the base of the skull through the vertex without intravenous contrast.  COMPARISON:  Head CT 01/12/2013, 04/29/2011, 04/29/2011  FINDINGS: Stable cerebral volume loss. Ventricular size is prominent and stable. Given the apparent discuss that given the size of the ventricles relative to the degree of atrophy, normal pressure hydrocephalus cannot be excluded. Small right frontal periventricular hypodensity is stable and suggests mild chronic white matter ischemic  changes. No evidence of acute intracranial infarction. Negative for intra or extra-axial hemorrhage, mass effect, or midline shift.  The skull is intact. The visualized paranasal sinuses and mastoid air cells are clear. Sequela of previous ethmoid sinus surgery.  IMPRESSION: 1. Ventriculomegaly is stable and appears slightly greater than expected for the degree of cerebral atrophy. The possibility of normal pressure hydrocephalus is not excluded. 2. Negative for hemorrhage, evidence of acute infarction, or midline shift.   Electronically Signed   By: Britta Mccreedy M.D.   On: 03/28/2013 15:26    EKG Interpretation    Date/Time:  Saturday March 28 2013 14:33:00 EST Ventricular Rate:  77 PR Interval:  156 QRS Duration: 70 QT Interval:  392 QTC Calculation: 443 R Axis:   44 Text Interpretation:  Normal sinus rhythm Normal ECG When compared with ECG of 06-Feb-2012 23:12, Left anterior fascicular block is no longer Present Minimal criteria for Septal infarct are no longer Present Confirmed by Imanuel Pruiett  MD, Rayel Santizo ([id number]) on 03/28/2013 4:44:44 PM            MDM   1. Fall, initial encounter   2. Anemia    Patient to a nursing home when she got up instead of waiting for help to the bathroom. She she was lightheaded. Lab work overall reassuring. Mild anemia, but guaiac-negative. Her vitals are reassuring here. He'll be followed by her PCP. Head CT lumbar spine films are reassuring. Will be discharged home to her skilled nursing center.     Juliet Rude. Rubin Payor, MD 03/28/13 [phone]

## 2013-03-28 NOTE — ED Notes (Signed)
Pt now reports that she has had diarrhea since yesterday,

## 2013-03-28 NOTE — ED Notes (Signed)
Dr Pickering at bedside,  

## 2013-05-08 ENCOUNTER — Emergency Department (HOSPITAL_COMMUNITY): Payer: Medicare Other

## 2013-05-08 ENCOUNTER — Emergency Department (HOSPITAL_COMMUNITY)
Admission: EM | Admit: 2013-05-08 | Discharge: 2013-05-09 | Disposition: A | Discharge status: Home / Self Care | Payer: Medicare Other | Attending: Emergency Medicine | Admitting: Emergency Medicine

## 2013-05-08 DIAGNOSIS — Y9210 Unspecified residential institution as the place of occurrence of the external cause: Secondary | ICD-10-CM | POA: Insufficient documentation

## 2013-05-08 DIAGNOSIS — G8929 Other chronic pain: Secondary | ICD-10-CM | POA: Insufficient documentation

## 2013-05-08 DIAGNOSIS — Z872 Personal history of diseases of the skin and subcutaneous tissue: Secondary | ICD-10-CM | POA: Insufficient documentation

## 2013-05-08 DIAGNOSIS — W19XXXA Unspecified fall, initial encounter: Secondary | ICD-10-CM

## 2013-05-08 DIAGNOSIS — S8990XA Unspecified injury of unspecified lower leg, initial encounter: Secondary | ICD-10-CM | POA: Insufficient documentation

## 2013-05-08 DIAGNOSIS — S99929A Unspecified injury of unspecified foot, initial encounter: Secondary | ICD-10-CM

## 2013-05-08 DIAGNOSIS — S99919A Unspecified injury of unspecified ankle, initial encounter: Secondary | ICD-10-CM

## 2013-05-08 DIAGNOSIS — Y92129 Unspecified place in nursing home as the place of occurrence of the external cause: Secondary | ICD-10-CM

## 2013-05-08 DIAGNOSIS — R296 Repeated falls: Secondary | ICD-10-CM | POA: Insufficient documentation

## 2013-05-08 DIAGNOSIS — S79929A Unspecified injury of unspecified thigh, initial encounter: Secondary | ICD-10-CM

## 2013-05-08 DIAGNOSIS — S0990XA Unspecified injury of head, initial encounter: Secondary | ICD-10-CM | POA: Insufficient documentation

## 2013-05-08 DIAGNOSIS — Z8639 Personal history of other endocrine, nutritional and metabolic disease: Secondary | ICD-10-CM | POA: Insufficient documentation

## 2013-05-08 DIAGNOSIS — Z79899 Other long term (current) drug therapy: Secondary | ICD-10-CM | POA: Insufficient documentation

## 2013-05-08 DIAGNOSIS — Z862 Personal history of diseases of the blood and blood-forming organs and certain disorders involving the immune mechanism: Secondary | ICD-10-CM | POA: Insufficient documentation

## 2013-05-08 DIAGNOSIS — K219 Gastro-esophageal reflux disease without esophagitis: Secondary | ICD-10-CM | POA: Insufficient documentation

## 2013-05-08 DIAGNOSIS — F411 Generalized anxiety disorder: Secondary | ICD-10-CM | POA: Insufficient documentation

## 2013-05-08 DIAGNOSIS — Z8739 Personal history of other diseases of the musculoskeletal system and connective tissue: Secondary | ICD-10-CM | POA: Insufficient documentation

## 2013-05-08 DIAGNOSIS — F329 Major depressive disorder, single episode, unspecified: Secondary | ICD-10-CM | POA: Insufficient documentation

## 2013-05-08 DIAGNOSIS — Z87891 Personal history of nicotine dependence: Secondary | ICD-10-CM | POA: Insufficient documentation

## 2013-05-08 DIAGNOSIS — F3289 Other specified depressive episodes: Secondary | ICD-10-CM | POA: Insufficient documentation

## 2013-05-08 DIAGNOSIS — K589 Irritable bowel syndrome without diarrhea: Secondary | ICD-10-CM | POA: Insufficient documentation

## 2013-05-08 DIAGNOSIS — Y9389 Activity, other specified: Secondary | ICD-10-CM | POA: Insufficient documentation

## 2013-05-08 DIAGNOSIS — S79919A Unspecified injury of unspecified hip, initial encounter: Secondary | ICD-10-CM | POA: Insufficient documentation

## 2013-05-08 DIAGNOSIS — Z792 Long term (current) use of antibiotics: Secondary | ICD-10-CM | POA: Insufficient documentation

## 2013-05-08 NOTE — ED Provider Notes (Signed)
CSN: [id number]     Arrival date & time 05/08/13  2333 History   This chart was scribed for Dione Booze, MD by Manuela Schwartz, ED scribe. This patient was seen in room [facility] and the patient's care was started at 2333.  Chief Complaint  Patient presents with  . Fall    Level 5 caveat for dementia  The history is provided by the patient. No language interpreter was used.   HPI Comments: [name] is a 68 y.o. female brought in by ambulance, who presents to the Emergency Department complaining of posterior head pain after she fell this PM. She reports hx of frequent falls. EMS reports she was also c/o right hip pain which appears to be a chronic problem to her right hip implant which rejected.   Past Medical History  Diagnosis Date  . IBS (irritable bowel syndrome)   . GERD (gastroesophageal reflux disease)   . Gout   . Chronic back pain   . Chronic right hip pain   . Chronic diarrhea   . Depression   . Anxiety and depression   . Cellulitis   . Anemia   . Bacteremia   . Muscle weakness (generalized)   . Psychosis   . Anemia   . Pressure ulcer of buttock   . Hypopotassemia   . Osteoporosis   . Gout    Past Surgical History  Procedure Laterality Date  . Hip arthroplasty  05/01/2011    Procedure: right ARTHROPLASTY BIPOLAR HIP;  Surgeon: Fuller Canada, MD;  Location: [facility];  Service: Orthopedics;  Laterality: Right;  . Incision and drainage of wound  05/24/2011    Procedure: IRRIGATION AND DEBRIDEMENT WOUND;  Surgeon: Fuller Canada, MD;  Location: [facility];  Service: Orthopedics;  Laterality: Right;  . Incision and drainage hip  02/08/2012    Procedure: IRRIGATION AND DEBRIDEMENT HIP;  Surgeon: Vickki Hearing, MD;  Location: [facility];  Service: Orthopedics;  Laterality: Right;   No family history on file. History  Substance Use Topics  . Smoking status: Former Games developer  . Smokeless tobacco: Never Used  . Alcohol Use: No   OB History   Grav Para Term Preterm  Abortions TAB SAB Ect Mult Living                 Review of Systems  Unable to perform ROS Constitutional: Negative for fever and chills.  HENT:       Posterior head pain  Respiratory: Negative for cough and shortness of breath.   Cardiovascular: Negative for chest pain.  Gastrointestinal: Negative for abdominal pain.  Musculoskeletal: Negative for back pain.  All other systems reviewed and are negative.    Allergies  Diphenhydramine  Home Medications   Current Outpatient Rx  Name  Route  Sig  Dispense  Refill  . Amino Acids-Protein Hydrolys (PRO-STAT AWC) LIQD   Oral   Take 30 mLs by mouth 2 (two) times daily.         Marland Kitchen buPROPion (WELLBUTRIN XL) 150 MG 24 hr tablet   Oral   Take 450 mg by mouth every morning.          Joycelyn Rua Gel Base (HYDROGEL) GEL   Topical   Apply 1 application topically every other day. Applied to the left hip for wound cleansing with normal saline or wound cleanser         . dicyclomine (BENTYL) 10 MG capsule   Oral   Take 1 capsule (10 mg  total) by mouth 4 (four) times daily -  before meals and at bedtime.   40 capsule   0   . fesoterodine (TOVIAZ) 4 MG TB24   Oral   Take 1 tablet (4 mg total) by mouth daily.   30 tablet   0   . gabapentin (NEURONTIN) 300 MG capsule   Oral   Take 300 mg by mouth 3 (three) times daily.         . Heparin Lock Flush (HEPARIN, PORCINE, LOCK FLUSH) 100 UNIT/ML SOLN   Intravenous   Inject 5 mLs into the vein daily.         Marland Kitchen HYDROcodone-acetaminophen (NORCO/VICODIN) 5-325 MG per tablet   Oral   Take 1 tablet by mouth every 4 (four) hours as needed. pain         . loperamide (IMODIUM) 2 MG capsule   Oral   Take 2-4 mg by mouth 4 (four) times daily as needed. For diarrhea, take 2 tablets (4mg ) initially then take 2 mg after each loose stool         . LORazepam (ATIVAN) 0.5 MG tablet   Oral   Take 0.5 mg by mouth 3 (three) times daily.          . mirtazapine (REMERON) 15 MG tablet    Oral   Take 15 mg by mouth every evening.         Marland Kitchen morphine (MS CONTIN) 30 MG 12 hr tablet   Oral   Take 30 mg by mouth every 12 (twelve) hours.         . Nutritional Supplements (RESOURCE 2.0) LIQD   Oral   Take 120 mLs by mouth 4 (four) times daily.          Marland Kitchen OLANZapine (ZYPREXA) 10 MG tablet   Oral   Take 10 mg by mouth at bedtime.         Marland Kitchen omeprazole (PRILOSEC) 20 MG capsule   Oral   Take 20 mg by mouth daily.         Marland Kitchen oxyCODONE-acetaminophen (PERCOCET) 5-325 MG per tablet   Oral   Take 2 tablets by mouth every 4 (four) hours as needed for pain.   20 tablet   0   . simvastatin (ZOCOR) 20 MG tablet   Oral   Take 20 mg by mouth at bedtime.          . sodium chloride 0.9 % injection   Intravenous   Inject 5 mLs into the vein daily.         . vancomycin (VANCOCIN) 750 MG SOLR injection   Intravenous   Inject 750 mg into the vein daily. For 14 days (started on 03/25/13)          BP 118/68  Pulse 85  Temp(Src) 98.5 F (36.9 C) (Oral)  Resp 20  SpO2 100% Physical Exam  Nursing note and vitals reviewed. Constitutional: She is oriented to person, place, and time. She appears well-developed and well-nourished. No distress.  HENT:  Head: Normocephalic and atraumatic.  Eyes: Conjunctivae are normal. Right eye exhibits no discharge. Left eye exhibits no discharge.  Neck: Normal range of motion.  Cardiovascular: Normal rate.   Pulmonary/Chest: Effort normal. No respiratory distress.  Musculoskeletal: Normal range of motion. She exhibits no edema.  Brace present over right thigh and hip. Pain on passive rom right hip and right knee  Neurological: She is alert and oriented to person, place, and time.  Oriented to  person but not place or time   Skin: Skin is warm and dry.  Psychiatric: She has a normal mood and affect. Thought content normal.   ED Course  Procedures (including critical care time) DIAGNOSTIC STUDIES: Oxygen Saturation is 100% on  room air, normal by my interpretation.    COORDINATION OF CARE: At 1140 PM Discussed treatment plan with patient which includes right knee X-ray, head/C-spine, CT, right hip X-ray. Patient agrees.   Imaging Review Dg Hip Complete Right  05/09/2013   CLINICAL DATA:  Chronic right hip pain, fall yesterday  EXAM: RIGHT HIP - COMPLETE 2+ VIEW  COMPARISON:  Concurrently obtained radiographs of the right knee; prior radiographs of the right pelvis and right hip 07/20/2012  FINDINGS: Compared to April of 2014 there has been interval removal of the right hip arthroplasty component. There is been progression of degenerative change at the right hip joints with developing pseudoarthrosis. No definite acute fracture identified. Residual metallic clips are noted in the lateral cortical margin. A linear lucency in the proximal femur is favored to reflect the prior bone prosthetic interface. A longitudinally oriented acute fracture line is difficult to exclude.  IMPRESSION: 1. Compared to prior imaging there has been interval removal of the right hip arthroplasty prosthesis. 2. A linear lucency in the superior part of the residual femur is favored to reflect the site of the prior bone-prosthesis interface. However, an acute longitudinal fracture is difficult to exclude entirely. If clinically warranted, CT scan of the pelvis may be helpful. 3. 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   Electronically Signed   By: Malachy MoanHeath  McCullough M.D.   On: 05/09/2013 00:42   Ct Head Wo Contrast  05/09/2013   CLINICAL DATA:  Fall with right-sided head pain and bilateral neck pain and stiffness.  EXAM: CT HEAD WITHOUT CONTRAST  CT CERVICAL SPINE WITHOUT CONTRAST  TECHNIQUE: Multidetector CT imaging of the head and cervical spine was performed following the standard protocol without intravenous contrast. Multiplanar CT image reconstructions of the cervical spine were also generated.   COMPARISON:  CT HEAD W/O CM dated 03/28/2013  FINDINGS: CT HEAD FINDINGS  There is no evidence of acute cortical infarct or intracranial hemorrhage. The ventricles remain enlarged and mildly out of proportion to the degree of sulcal enlargement, unchanged. Patchy bilateral cerebral white matter hypodensities do not appear significantly changed and are compatible with mild chronic small vessel ischemic disease. There is no evidence of mass, midline shift, or extra-axial fluid collection. Prior bilateral cataract surgery is noted. The mastoid air cells and visualized paranasal sinuses are clear. Prior sinus surgery is noted.  CT CERVICAL SPINE FINDINGS  Trace retrolisthesis of C4 on C5, likely unchanged. Prevertebral soft tissues are unremarkable. Sclerotic focus in the right lateral mass of C1 is unchanged and may represent a bone island. No acute cervical spine fracture is identified. Anterior osteophytosis at C5-6 is unchanged. Mild biapical lung scarring is noted.  IMPRESSION: 1. No evidence of acute intracranial abnormality. 2. Unchanged enlargement of the ventricles, most likely reflecting central atrophy although normal pressure hydrocephalus cannot be completely excluded. 3. Unchanged, mild chronic white matter disease. 4. No evidence of acute osseous abnormality in the cervical spine.   Electronically Signed   By: Sebastian AcheAllen  Grady   On: 05/09/2013 00:39   Ct Cervical Spine Wo Contrast  05/09/2013   CLINICAL DATA:  Fall with right-sided head pain and bilateral neck pain and stiffness.  EXAM: CT HEAD WITHOUT CONTRAST  CT CERVICAL SPINE WITHOUT CONTRAST  TECHNIQUE: Multidetector CT imaging of the head and cervical spine was performed following the standard protocol without intravenous contrast. Multiplanar CT image reconstructions of the cervical spine were also generated.  COMPARISON:  CT HEAD W/O CM dated 03/28/2013  FINDINGS: CT HEAD FINDINGS  There is no evidence of acute cortical infarct or intracranial  hemorrhage. The ventricles remain enlarged and mildly out of proportion to the degree of sulcal enlargement, unchanged. Patchy bilateral cerebral white matter hypodensities do not appear significantly changed and are compatible with mild chronic small vessel ischemic disease. There is no evidence of mass, midline shift, or extra-axial fluid collection. Prior bilateral cataract surgery is noted. The mastoid air cells and visualized paranasal sinuses are clear. Prior sinus surgery is noted.  CT CERVICAL SPINE FINDINGS  Trace retrolisthesis of C4 on C5, likely unchanged. Prevertebral soft tissues are unremarkable. Sclerotic focus in the right lateral mass of C1 is unchanged and may represent a bone island. No acute cervical spine fracture is identified. Anterior osteophytosis at C5-6 is unchanged. Mild biapical lung scarring is noted.  IMPRESSION: 1. No evidence of acute intracranial abnormality. 2. Unchanged enlargement of the ventricles, most likely reflecting central atrophy although normal pressure hydrocephalus cannot be completely excluded. 3. Unchanged, mild chronic white matter disease. 4. No evidence of acute osseous abnormality in the cervical spine.   Electronically Signed   By: Sebastian Ache   On: 05/09/2013 00:39   Dg Knee Complete 4 Views Right  05/09/2013   CLINICAL DATA:  Fall, pain  EXAM: RIGHT KNEE - COMPLETE 4+ VIEW  COMPARISON:  Concurrently obtained radiographs of the pelvis and right hip  FINDINGS: The bones are diffusely osteopenic. Diffuse soft tissue atrophy. No definite acute fracture, malalignment or knee joint effusion.  IMPRESSION: No acute osseous abnormality.  Diffuse soft tissue atrophy and disuse osteopenia.   Electronically Signed   By: Malachy Moan M.D.   On: 05/09/2013 00:44    MDM   Final diagnoses:  Fall at nursing home   Fall without obvious injury. There is reported to of been swelling of her head but I cannot see any objective signs swelling. Pain in her right leg  is probably chronic related to her recent hip surgery but x-rays will be obtained and she'll be sent for CT of head and cervical spine.  X-rays and CTs are negative for acute process. She is discharged back to the nursing home.  I personally performed the services described in this documentation, which was scribed in my presence. The recorded information has been reviewed and is accurate.       Dione Booze, MD 05/09/13 0130

## 2013-05-08 NOTE — ED Notes (Signed)
Pt from [facility], apparently fell tonight, has swelling  To posterior right parietal scalp, no laceration or bleeding noted.  Pt is awake and alert, nad

## 2013-05-09 NOTE — Discharge Instructions (Signed)

## 2013-05-09 NOTE — ED Notes (Signed)
Patient waiting for a ems to trasport back to [facility]

## 2013-11-20 ENCOUNTER — Emergency Department (HOSPITAL_COMMUNITY): Payer: Medicare Other

## 2013-11-20 ENCOUNTER — Encounter (HOSPITAL_COMMUNITY): Payer: Self-pay | Admitting: Emergency Medicine

## 2013-11-20 ENCOUNTER — Emergency Department (HOSPITAL_COMMUNITY)
Admission: EM | Admit: 2013-11-20 | Discharge: 2013-11-20 | Disposition: A | Discharge status: Home / Self Care | Payer: Medicare Other | Attending: Emergency Medicine | Admitting: Emergency Medicine

## 2013-11-20 DIAGNOSIS — Z792 Long term (current) use of antibiotics: Secondary | ICD-10-CM | POA: Diagnosis not present

## 2013-11-20 DIAGNOSIS — Z8639 Personal history of other endocrine, nutritional and metabolic disease: Secondary | ICD-10-CM | POA: Insufficient documentation

## 2013-11-20 DIAGNOSIS — G8929 Other chronic pain: Secondary | ICD-10-CM | POA: Diagnosis not present

## 2013-11-20 DIAGNOSIS — W1809XA Striking against other object with subsequent fall, initial encounter: Secondary | ICD-10-CM | POA: Insufficient documentation

## 2013-11-20 DIAGNOSIS — F3289 Other specified depressive episodes: Secondary | ICD-10-CM | POA: Diagnosis not present

## 2013-11-20 DIAGNOSIS — Y929 Unspecified place or not applicable: Secondary | ICD-10-CM | POA: Insufficient documentation

## 2013-11-20 DIAGNOSIS — S0990XA Unspecified injury of head, initial encounter: Secondary | ICD-10-CM | POA: Insufficient documentation

## 2013-11-20 DIAGNOSIS — S0100XA Unspecified open wound of scalp, initial encounter: Secondary | ICD-10-CM | POA: Insufficient documentation

## 2013-11-20 DIAGNOSIS — Z862 Personal history of diseases of the blood and blood-forming organs and certain disorders involving the immune mechanism: Secondary | ICD-10-CM | POA: Diagnosis not present

## 2013-11-20 DIAGNOSIS — Y9389 Activity, other specified: Secondary | ICD-10-CM | POA: Diagnosis not present

## 2013-11-20 DIAGNOSIS — M810 Age-related osteoporosis without current pathological fracture: Secondary | ICD-10-CM | POA: Insufficient documentation

## 2013-11-20 DIAGNOSIS — F329 Major depressive disorder, single episode, unspecified: Secondary | ICD-10-CM | POA: Diagnosis not present

## 2013-11-20 DIAGNOSIS — Z79899 Other long term (current) drug therapy: Secondary | ICD-10-CM | POA: Diagnosis not present

## 2013-11-20 DIAGNOSIS — S79919A Unspecified injury of unspecified hip, initial encounter: Secondary | ICD-10-CM | POA: Diagnosis not present

## 2013-11-20 DIAGNOSIS — K219 Gastro-esophageal reflux disease without esophagitis: Secondary | ICD-10-CM | POA: Insufficient documentation

## 2013-11-20 DIAGNOSIS — S79929A Unspecified injury of unspecified thigh, initial encounter: Secondary | ICD-10-CM

## 2013-11-20 DIAGNOSIS — D649 Anemia, unspecified: Secondary | ICD-10-CM | POA: Diagnosis not present

## 2013-11-20 DIAGNOSIS — Z87891 Personal history of nicotine dependence: Secondary | ICD-10-CM | POA: Diagnosis not present

## 2013-11-20 DIAGNOSIS — S0101XA Laceration without foreign body of scalp, initial encounter: Secondary | ICD-10-CM

## 2013-11-20 MED ORDER — HYDROCODONE-ACETAMINOPHEN 5-325 MG PO TABS
1.0000 | ORAL_TABLET | Freq: Once | ORAL | Status: AC
  Administered 2013-11-20: 1 via ORAL
  Filled 2013-11-20: qty 1

## 2013-11-20 NOTE — ED Notes (Signed)
Pt requesting medication for headache.  Pain level 10/10.  MD will be made aware.

## 2013-11-20 NOTE — ED Notes (Signed)
Pt was ambulating to the restroom with wheelchair when she fell and hit the back of her head on the door facing.  Denies any loss of LOC.  Pt is acting her normal per Warehouse manager from facility.  NAD upon arrival.  Pt was on the bed when EMS arrived.

## 2013-11-20 NOTE — ED Provider Notes (Signed)
CSN: [id number]     Arrival date & time 11/20/13  1602 History   First MD Initiated Contact with Patient 11/20/13 1634    This chart was scribed for Benny Lennert, MD by Marica Otter, ED Scribe. This patient was seen in room APA14/APA14 and the patient's care was started at 4:39 PM.  Chief Complaint  Patient presents with  . Fall   Patient is a 68 y.o. female presenting with fall. The history is provided by the patient. No language interpreter was used.  Fall This is a new problem. The current episode started 1 to 2 hours ago. The problem occurs constantly. The problem has not changed since onset.Associated symptoms include headaches. Pertinent negatives include no chest pain and no abdominal pain. Nothing aggravates the symptoms.   HPI Comments: [name] is a 68 y.o. female, arriving by ambulance, who is a resident of [facility] and with medical Hx noted below, presents to the Emergency Department complaining of a fall sustained earlier today when pt fell off her bed and hit her head. Pt denies loc, however, notes head trauma and complains of associated head pain. Pt also complains of associated pain to her right hip.   Past Medical History  Diagnosis Date  . IBS (irritable bowel syndrome)   . GERD (gastroesophageal reflux disease)   . Gout   . Chronic back pain   . Chronic right hip pain   . Chronic diarrhea   . Depression   . Anxiety and depression   . Cellulitis   . Anemia   . Bacteremia   . Muscle weakness (generalized)   . Psychosis   . Anemia   . Pressure ulcer of buttock   . Hypopotassemia   . Osteoporosis   . Gout    Past Surgical History  Procedure Laterality Date  . Hip arthroplasty  05/01/2011    Procedure: right ARTHROPLASTY BIPOLAR HIP;  Surgeon: Fuller Canada, MD;  Location: [facility];  Service: Orthopedics;  Laterality: Right;  . Incision and drainage of wound  05/24/2011    Procedure: IRRIGATION AND DEBRIDEMENT WOUND;  Surgeon: Fuller Canada, MD;   Location: [facility];  Service: Orthopedics;  Laterality: Right;  . Incision and drainage hip  02/08/2012    Procedure: IRRIGATION AND DEBRIDEMENT HIP;  Surgeon: Vickki Hearing, MD;  Location: [facility];  Service: Orthopedics;  Laterality: Right;   History reviewed. No pertinent family history. History  Substance Use Topics  . Smoking status: Former Games developer  . Smokeless tobacco: Never Used  . Alcohol Use: No   OB History   Grav Para Term Preterm Abortions TAB SAB Ect Mult Living                 Review of Systems  Constitutional: Negative for appetite change and fatigue.  HENT: Negative for congestion, ear discharge and sinus pressure.   Eyes: Negative for discharge.  Respiratory: Negative for cough.   Cardiovascular: Negative for chest pain.  Gastrointestinal: Negative for abdominal pain and diarrhea.  Genitourinary: Negative for frequency and hematuria.  Musculoskeletal: Negative for back pain.       Right hip pain  Skin: Positive for wound (laceration to back of head). Negative for rash.  Neurological: Positive for headaches. Negative for seizures.  Psychiatric/Behavioral: Negative for hallucinations.  All other systems reviewed and are negative.     Allergies  Diphenhydramine  Home Medications   Prior to Admission medications   Medication Sig Start Date End Date Taking? Authorizing Provider  Amino Acids-Protein Hydrolys (PRO-STAT AWC) LIQD Take 30 mLs by mouth 2 (two) times daily.    Historical Provider, MD  buPROPion (WELLBUTRIN XL) 150 MG 24 hr tablet Take 450 mg by mouth every morning.     Historical Provider, MD  Carbomer Gel Base (HYDROGEL) GEL Apply 1 application topically every other day. Applied to the left hip for wound cleansing with normal saline or wound cleanser    Historical Provider, MD  dicyclomine (BENTYL) 10 MG capsule Take 1 capsule (10 mg total) by mouth 4 (four) times daily -  before meals and at bedtime. 05/04/11   Vickki Hearing, MD  fesoterodine  (TOVIAZ) 4 MG TB24 Take 1 tablet (4 mg total) by mouth daily. 05/04/11   Vickki Hearing, MD  gabapentin (NEURONTIN) 300 MG capsule Take 300 mg by mouth 3 (three) times daily.    Historical Provider, MD  Heparin Lock Flush (HEPARIN, PORCINE, LOCK FLUSH) 100 UNIT/ML SOLN Inject 5 mLs into the vein daily.    Historical Provider, MD  HYDROcodone-acetaminophen (NORCO/VICODIN) 5-325 MG per tablet Take 1 tablet by mouth every 4 (four) hours as needed. pain    Historical Provider, MD  loperamide (IMODIUM) 2 MG capsule Take 2-4 mg by mouth 4 (four) times daily as needed. For diarrhea, take 2 tablets ( ) initially then take 2 mg after each loose stool    Historical Provider, MD  LORazepam (ATIVAN) 0.5 MG tablet Take 0.5 mg by mouth 3 (three) times daily.  01/01/13   Historical Provider, MD  mirtazapine (REMERON) 15 MG tablet Take 15 mg by mouth every evening.    Historical Provider, MD  morphine (MS CONTIN) 30 MG 12 hr tablet Take 30 mg by mouth every 12 (twelve) hours.    Historical Provider, MD  Nutritional Supplements (RESOURCE 2.0) LIQD Take 120 mLs by mouth 4 (four) times daily.     Historical Provider, MD  OLANZapine (ZYPREXA) 10 MG tablet Take 10 mg by mouth at bedtime.    Historical Provider, MD  omeprazole (PRILOSEC) 20 MG capsule Take 20 mg by mouth daily.    Historical Provider, MD  oxyCODONE-acetaminophen (PERCOCET) 5-325 MG per tablet Take 2 tablets by mouth every 4 (four) hours as needed for pain. 01/12/13   Geoffery Lyons, MD  simvastatin (ZOCOR) 20 MG tablet Take 20 mg by mouth at bedtime.     Historical Provider, MD  sodium chloride 0.9 % injection Inject 5 mLs into the vein daily.    Historical Provider, MD  vancomycin (VANCOCIN) 750 MG SOLR injection Inject 750 mg into the vein daily. For 14 days (started on 03/25/13)    Historical Provider, MD   Triage Vitals: BP 100/53  Pulse 81  Temp(Src) 99 F (37.2 C) (Oral)  Resp 18  Ht  (1.626 m)  Wt 135 lb (61.236 kg)  BMI 23.16 kg/m2   SpO2 94% Physical Exam  Constitutional: She is oriented to person, place, and time. She appears well-developed.  HENT:  Head: Normocephalic.  Eyes: Conjunctivae and EOM are normal. No scleral icterus.  Neck: Neck supple. No thyromegaly present.  Cardiovascular: Normal rate and regular rhythm.  Exam reveals no gallop and no friction rub.   No murmur heard. Pulmonary/Chest: No stridor. She has no wheezes. She has no rales. She exhibits no tenderness.  Abdominal: She exhibits no distension. There is no tenderness. There is no rebound.  Musculoskeletal: Normal range of motion. She exhibits no edema.  Lymphadenopathy:    She has no cervical  adenopathy.  Neurological: She is oriented to person, place, and time. She exhibits normal muscle tone. Coordination normal.  Skin: No rash noted. No erythema.  Swelling to back of head with abrasion and tenderness. 3cm laceration to the back of the head.   Psychiatric: She has a normal mood and affect. Her behavior is normal.   LACERATION REPAIR Performed by: Benny Lennert, MD Consent: Verbal consent obtained. Risks and benefits: risks, benefits and alternatives were discussed Patient identity confirmed: provided demographic data Time out performed prior to procedure Prepped and Draped in normal sterile fashion Wound explored Laceration Location: back of head Laceration Length: 3cm No Foreign Bodies seen or palpated Anesthesia: local infiltration Cleaning Method: Betadine  Amount of cleaning: standard Skin closure: 4 staples Number of sutures or staples: 4 Patient tolerance: Patient tolerated the procedure well with no immediate complications.  ED Course  Procedures (including critical care time) DIAGNOSTIC STUDIES: Oxygen Saturation is 94% on RA, adequate by my interpretation.    COORDINATION OF CARE: 4:40 PM-Discussed treatment plan which includes imaging with pt at bedside and pt agreed to plan.   Labs Review Labs Reviewed - No data  to display  Imaging Review No results found.   EKG Interpretation None      MDM   Final diagnoses:  None   The chart was scribed for me under my direct supervision.  I personally performed the history, physical, and medical decision making and all procedures in the evaluation of this patient.Benny Lennert, MD 11/20/13 [phone]

## 2013-11-20 NOTE — Discharge Instructions (Signed)
Clean laceration twice a day with soap and water.  [facility] out in 1 week

## 2013-11-20 NOTE — ED Notes (Signed)
Patient and caregiver verbalize understanding of discharge instructions, wound care, and follow up care to have staples removed. Report given to Rinaldo Cloud at [facility] who is taking care of patient and she also verbalizes understanding of care and follow up care. Patient out of department at this time with [facility]

## 2013-11-20 NOTE — ED Notes (Signed)
Per pts nursing faculty member from [facility] at bedside, reported to RN that pt needs to be taken back by EMS.  Will call EMS for transport.

## 2013-11-20 NOTE — ED Notes (Signed)
Resident at [facility].  Staff found in bathroom floor.  Laceration to back of head.  C/o pain right hip.

## 2014-03-02 IMAGING — CR DG CHEST 1V PORT
1 series · 1 of 1 positions shown · non-contrast
Comparison: 01/23/2012

CLINICAL DATA: Preoperative respiratory exam for pelvic surgery.

PORTABLE CHEST - 1 VIEW

[view not recorded]
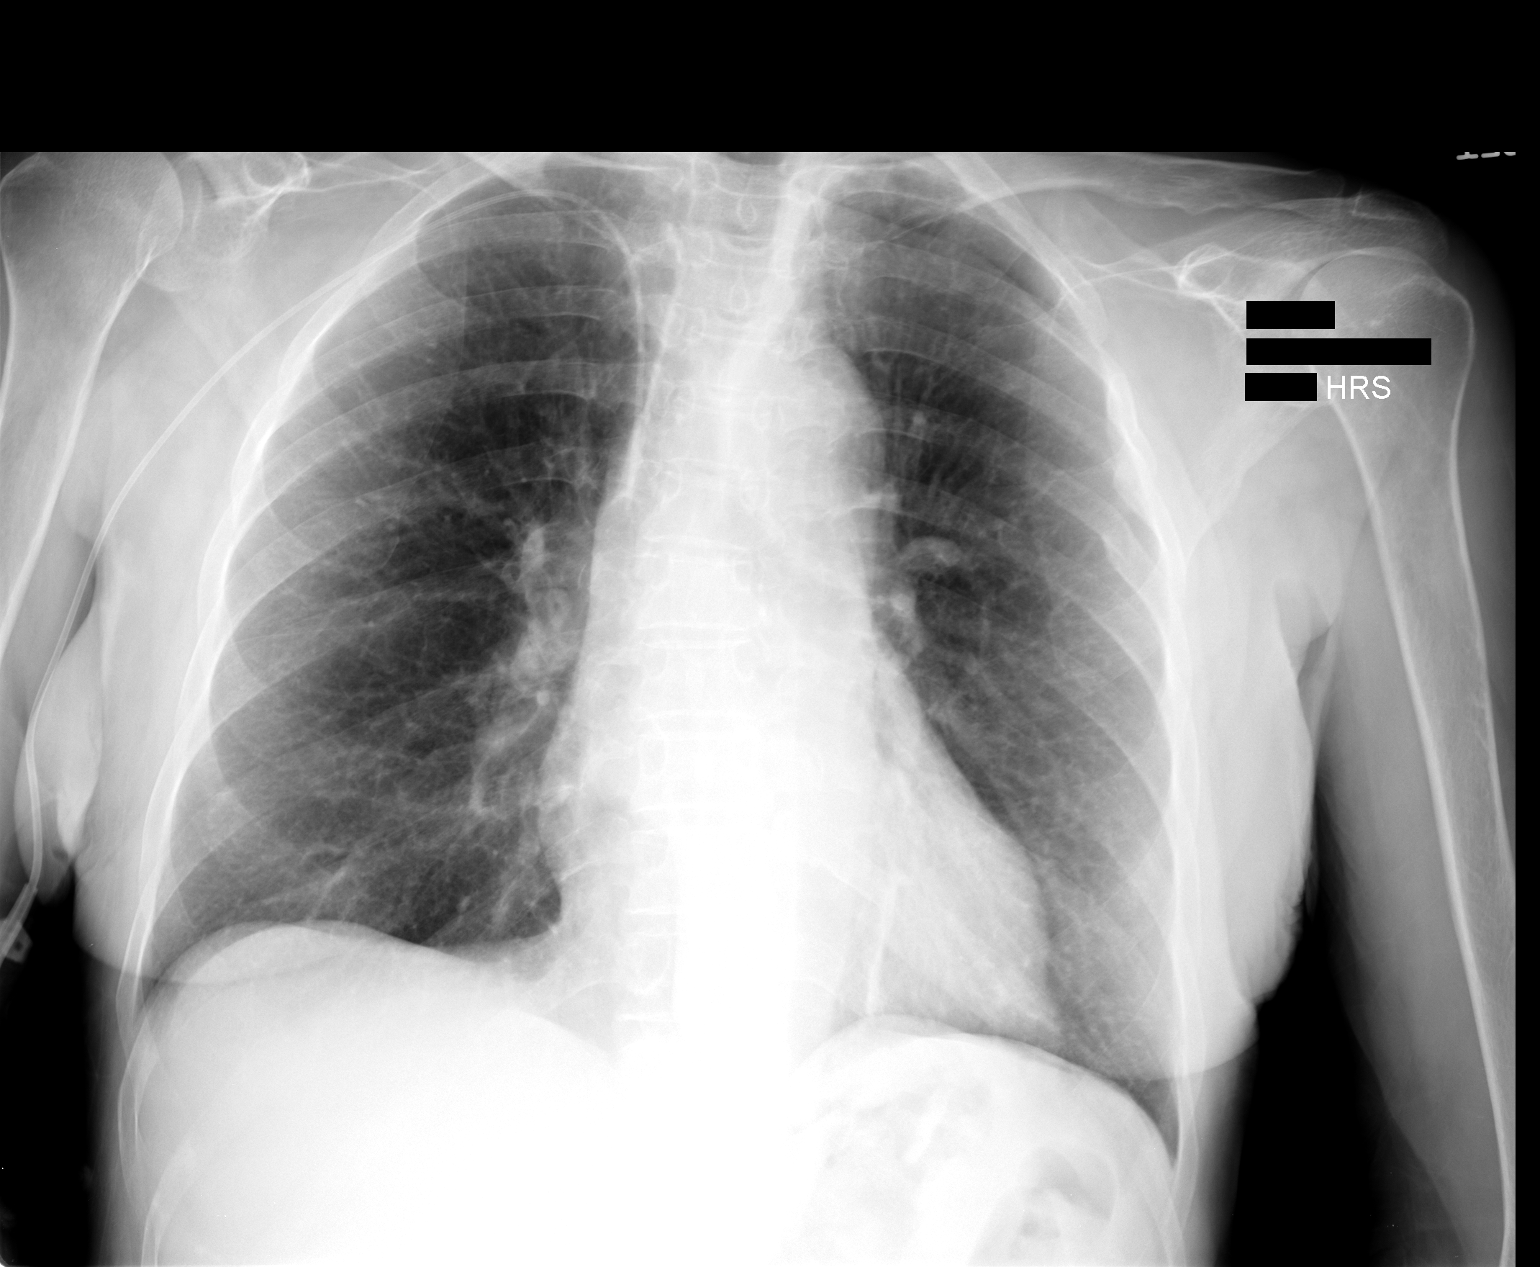

[1 of 1 positions shown; findings below may reference images not displayed]

FINDINGS: Right arm PICC has its tip in the SVC 6 cm above the
right atrium.  Heart size is normal.  Mediastinal shadows are
normal.  Lungs are clear except for mild linear atelectasis at the
left base.  Old healed rib fractures on the left.
IMPRESSION: Mild linear atelectasis left base.  Otherwise no active disease.

## 2014-04-08 ENCOUNTER — Encounter (HOSPITAL_COMMUNITY): Payer: Self-pay | Admitting: Orthopedic Surgery

## 2014-06-06 ENCOUNTER — Encounter (HOSPITAL_COMMUNITY): Payer: Self-pay | Admitting: *Deleted

## 2014-06-06 ENCOUNTER — Emergency Department (HOSPITAL_COMMUNITY): Payer: Medicare Other

## 2014-06-06 ENCOUNTER — Emergency Department (HOSPITAL_COMMUNITY)
Admission: EM | Admit: 2014-06-06 | Discharge: 2014-06-06 | Disposition: A | Discharge status: Home / Self Care | Payer: Medicare Other | Attending: Emergency Medicine | Admitting: Emergency Medicine

## 2014-06-06 DIAGNOSIS — W1839XA Other fall on same level, initial encounter: Secondary | ICD-10-CM | POA: Insufficient documentation

## 2014-06-06 DIAGNOSIS — Z79891 Long term (current) use of opiate analgesic: Secondary | ICD-10-CM | POA: Insufficient documentation

## 2014-06-06 DIAGNOSIS — Z23 Encounter for immunization: Secondary | ICD-10-CM | POA: Diagnosis not present

## 2014-06-06 DIAGNOSIS — S0101XA Laceration without foreign body of scalp, initial encounter: Secondary | ICD-10-CM

## 2014-06-06 DIAGNOSIS — Y998 Other external cause status: Secondary | ICD-10-CM | POA: Diagnosis not present

## 2014-06-06 DIAGNOSIS — S79911A Unspecified injury of right hip, initial encounter: Secondary | ICD-10-CM | POA: Diagnosis not present

## 2014-06-06 DIAGNOSIS — Z79899 Other long term (current) drug therapy: Secondary | ICD-10-CM | POA: Diagnosis not present

## 2014-06-06 DIAGNOSIS — E119 Type 2 diabetes mellitus without complications: Secondary | ICD-10-CM | POA: Insufficient documentation

## 2014-06-06 DIAGNOSIS — K589 Irritable bowel syndrome without diarrhea: Secondary | ICD-10-CM | POA: Insufficient documentation

## 2014-06-06 DIAGNOSIS — Y9289 Other specified places as the place of occurrence of the external cause: Secondary | ICD-10-CM | POA: Diagnosis not present

## 2014-06-06 DIAGNOSIS — G8929 Other chronic pain: Secondary | ICD-10-CM | POA: Insufficient documentation

## 2014-06-06 DIAGNOSIS — K219 Gastro-esophageal reflux disease without esophagitis: Secondary | ICD-10-CM | POA: Insufficient documentation

## 2014-06-06 DIAGNOSIS — Z872 Personal history of diseases of the skin and subcutaneous tissue: Secondary | ICD-10-CM | POA: Insufficient documentation

## 2014-06-06 DIAGNOSIS — Y9389 Activity, other specified: Secondary | ICD-10-CM | POA: Diagnosis not present

## 2014-06-06 DIAGNOSIS — S0990XA Unspecified injury of head, initial encounter: Secondary | ICD-10-CM | POA: Diagnosis present

## 2014-06-06 DIAGNOSIS — F329 Major depressive disorder, single episode, unspecified: Secondary | ICD-10-CM | POA: Insufficient documentation

## 2014-06-06 DIAGNOSIS — Z862 Personal history of diseases of the blood and blood-forming organs and certain disorders involving the immune mechanism: Secondary | ICD-10-CM | POA: Insufficient documentation

## 2014-06-06 DIAGNOSIS — W19XXXA Unspecified fall, initial encounter: Secondary | ICD-10-CM

## 2014-06-06 DIAGNOSIS — M109 Gout, unspecified: Secondary | ICD-10-CM | POA: Insufficient documentation

## 2014-06-06 DIAGNOSIS — F419 Anxiety disorder, unspecified: Secondary | ICD-10-CM | POA: Insufficient documentation

## 2014-06-06 DIAGNOSIS — M25551 Pain in right hip: Secondary | ICD-10-CM

## 2014-06-06 MED ORDER — ONDANSETRON HCL 4 MG/2ML IJ SOLN
4.0000 mg | Freq: Once | INTRAMUSCULAR | Status: AC
  Administered 2014-06-06: 4 mg via INTRAMUSCULAR
  Filled 2014-06-06: qty 2

## 2014-06-06 MED ORDER — MORPHINE SULFATE 4 MG/ML IJ SOLN
4.0000 mg | Freq: Once | INTRAMUSCULAR | Status: AC
  Administered 2014-06-06: 4 mg via INTRAVENOUS
  Filled 2014-06-06: qty 1

## 2014-06-06 MED ORDER — HYDROCODONE-ACETAMINOPHEN 5-325 MG PO TABS
1.0000 | ORAL_TABLET | Freq: Once | ORAL | Status: AC
  Administered 2014-06-06: 1 via ORAL
  Filled 2014-06-06: qty 1

## 2014-06-06 MED ORDER — LIDOCAINE-EPINEPHRINE (PF) 2 %-1:200000 IJ SOLN
INTRAMUSCULAR | Status: AC
  Administered 2014-06-06: 20:00:00
  Filled 2014-06-06: qty 20

## 2014-06-06 MED ORDER — TETANUS-DIPHTH-ACELL PERTUSSIS 5-2.5-18.5 LF-MCG/0.5 IM SUSP
0.5000 mL | Freq: Once | INTRAMUSCULAR | Status: AC
  Administered 2014-06-06: 0.5 mL via INTRAMUSCULAR
  Filled 2014-06-06: qty 0.5

## 2014-06-06 NOTE — ED Provider Notes (Signed)
Patient fell today while trying to get to the bathroom. She is wheelchair-bound. Presently complains of right-sided hip pain. On exam patient alert Glasgow Coma Score 15 right lower extremity well-healed surgical scar at hip. No pain on internal or external rotation of thigh. DP pulse 2+. No redness no swelling no point tenderness. Patient suffers from chronic pain. Pain is not well controlled after treatment with IVmorphine. Norco ordered. CT scan report reviewed. I spoke with Dr. Felecia ShellingFanta. Patient had long-term antibiotics for hip infection and open wound in the past. She has completed the course. In light of the fact that she fell. Suspicion for residual infection and further treatment with antibiotics extremely low. Dr. Cristal GenerousFantle states she can return to skilled nursing home for further treatment,and pain management  Doug SouSam Zephan Beauchaine, MD 06/06/14 [phone]

## 2014-06-06 NOTE — ED Provider Notes (Signed)
CSN: [id number]     Arrival date & time 06/06/14  1603 History  This chart was scribed for Burgess Amor, PA-C with Doug Sou, MD by Tonye Royalty, ED Scribe. This patient was seen in room APA08/APA08 and the patient's care was started at 5:09 PM.    Chief Complaint  Patient presents with  . Fall   The history is provided by the patient, a caregiver and medical records. No language interpreter was used.    HPI Comments: [name] is a 69 y.o. female resident at [facility] who presents to the Emergency Department complaining of fall just PTA. Per caregiver, she got up from the toilet and was going back to wheelchair when she slipped, striking her head on the door. She states she usually tries to get help getting back in her wheelchair but did not today. She complains of laceration to posterior head, headache, and right hip pain. She had prior surgery to right hip and states her right hip is the reason she uses a wheelchair. Records indicate she had surgery in October 2013. Records indicate she was hospitalized 2 years ago for infection in her right hip. She followed up with orthopedist at [facility]. She does not know when her last tetanus shot was. She denies SOB or weakness to arms or legs.  Past Medical History  Diagnosis Date  . IBS (irritable bowel syndrome)   . GERD (gastroesophageal reflux disease)   . Gout   . Chronic back pain   . Chronic right hip pain   . Chronic diarrhea   . Depression   . Anxiety and depression   . Cellulitis   . Anemia   . Bacteremia   . Muscle weakness (generalized)   . Psychosis   . Anemia   . Pressure ulcer of buttock   . Hypopotassemia   . Osteoporosis   . Gout    Past Surgical History  Procedure Laterality Date  . Hip arthroplasty  05/01/2011    Procedure: right ARTHROPLASTY BIPOLAR HIP;  Surgeon: Fuller Canada, MD;  Location: [facility];  Service: Orthopedics;  Laterality: Right;  . Incision and drainage of wound  05/24/2011    Procedure: IRRIGATION  AND DEBRIDEMENT WOUND;  Surgeon: Fuller Canada, MD;  Location: [facility];  Service: Orthopedics;  Laterality: Right;  . Incision and drainage hip  02/08/2012    Procedure: IRRIGATION AND DEBRIDEMENT HIP;  Surgeon: Vickki Hearing, MD;  Location: [facility];  Service: Orthopedics;  Laterality: Right;   History reviewed. No pertinent family history. History  Substance Use Topics  . Smoking status: Former Games developer  . Smokeless tobacco: Never Used  . Alcohol Use: No   OB History    No data available     Review of Systems  Constitutional: Negative for fever.  HENT: Negative for congestion and sore throat.   Eyes: Negative.   Respiratory: Negative for chest tightness and shortness of breath.   Cardiovascular: Negative for chest pain.  Gastrointestinal: Negative for nausea and abdominal pain.  Genitourinary: Negative.   Musculoskeletal: Negative for joint swelling and neck pain.       Right hip pain  Skin: Positive for wound. Negative for rash.  Neurological: Positive for headaches. Negative for dizziness, weakness, light-headedness and numbness.  Psychiatric/Behavioral: Negative.       Allergies  Diphenhydramine and Tape  Home Medications   Prior to Admission medications   Medication Sig Start Date End Date Taking? Authorizing Provider  acetaminophen (TYLENOL) 500 MG tablet Take 500  mg by mouth every 6 (six) hours as needed for mild pain.   Yes Historical Provider, MD  acidophilus (RISAQUAD) CAPS capsule Take 1 capsule by mouth daily.   Yes Historical Provider, MD  alum & mag hydroxide-simeth (MAALOX/MYLANTA) 200-200-20 MG/5ML suspension Take by mouth every 6 (six) hours as needed for indigestion or heartburn.   Yes Historical Provider, MD  buPROPion (WELLBUTRIN XL) 300 MG 24 hr tablet Take 300 mg by mouth daily.   Yes Historical Provider, MD  dicyclomine (BENTYL) 10 MG capsule Take 1 capsule (10 mg total) by mouth 4 (four) times daily -  before meals and at bedtime. 05/04/11  Yes  Vickki Hearing, MD  gabapentin (NEURONTIN) 300 MG capsule Take 300 mg by mouth 3 (three) times daily.   Yes Historical Provider, MD  LORazepam (ATIVAN) 0.5 MG tablet Take 0.5 mg by mouth daily.   Yes Historical Provider, MD  LORazepam (ATIVAN) 1 MG tablet Take 1 mg by mouth 2 (two) times daily.   Yes Historical Provider, MD  mirtazapine (REMERON) 45 MG tablet Take 45 mg by mouth every evening.   Yes Historical Provider, MD  morphine (MS CONTIN) 30 MG 12 hr tablet Take 30 mg by mouth every 12 (twelve) hours.   Yes Historical Provider, MD  Nutritional Supplements (RESOURCE 2.0) LIQD Take 120 mLs by mouth 2 (two) times daily.    Yes Historical Provider, MD  OLANZapine (ZYPREXA) 15 MG tablet Take 15 mg by mouth at bedtime.   Yes Historical Provider, MD  omeprazole (PRILOSEC) 20 MG capsule Take 20 mg by mouth daily.   Yes Historical Provider, MD  senna-docusate (SENNA PLUS) 8.6-50 MG per tablet Take 2 tablets by mouth 2 (two) times daily.   Yes Historical Provider, MD  simvastatin (ZOCOR) 20 MG tablet Take 20 mg by mouth at bedtime.    Yes Historical Provider, MD  loperamide (IMODIUM) 2 MG capsule Take 2-4 mg by mouth 4 (four) times daily as needed. For diarrhea, take 2 tablets ( ) initially then take 2 mg after each loose stool    Historical Provider, MD  Nystatin (NYAMYC) 100000 UNIT/GM POWD Apply 1 application topically 2 (two) times daily. Groin area for 5 days(started on 11/18/13 (evening) 11/18/13   Historical Provider, MD  Oxycodone HCl 10 MG TABS Take 10 mg by mouth every 6 (six) hours as needed (moderate to severe pain).    Historical Provider, MD   BP 118/64 mmHg  Pulse 89  Temp(Src) 98.7 F (37.1 C) (Oral)  Resp 16  Ht  (1.676 m)  Wt 140 lb (63.504 kg)  BMI 22.61 kg/m2  SpO2 97% Physical Exam  Constitutional: She appears well-developed and well-nourished.  HENT:  Head: Normocephalic.  1.5cm subcu laceration to posterior scalp which had bled but currently hemostatic She has  midline tenderness of her cervical spine No palpable deformity at the site of the laceration  Eyes: Conjunctivae are normal.  Neck: Normal range of motion.  Cardiovascular: Normal rate, regular rhythm, normal heart sounds and intact distal pulses.   Pulses equal bilaterally  Pulmonary/Chest: Effort normal and breath sounds normal. She has no wheezes.  Abdominal: Soft. Bowel sounds are normal. There is no tenderness.  Musculoskeletal: Normal range of motion. She exhibits tenderness.  Tenderness along her medial groin and hip area nontender at the lateral right hip Well healed surgical incision  DP pulses are intact with distal sensation intact of all extremities Right leg is positioned in internal rotation, there appears to be  some shortening  Neurological: She is alert. She has normal strength. She displays normal reflexes. No sensory deficit.  Skin: Skin is warm and dry.  Psychiatric: She has a normal mood and affect.  Nursing note and vitals reviewed.   ED Course  Procedures (including critical care time)  DIAGNOSTIC STUDIES: Oxygen Saturation is 96% on room air, normal by my interpretation.    COORDINATION OF CARE: 5:36 PM Discussed treatment plan with patient at beside, the patient agrees with the plan and has no further questions at this time.  LACERATION REPAIR Performed by: Burgess AmorJulie Rowan Blaker, PA-C Consent: Verbal consent obtained. Risks and benefits: risks, benefits and alternatives were discussed Patient identity confirmed: provided demographic data Time out performed prior to procedure Prepped and Draped in normal sterile fashion Wound explored Laceration Location: posterior scalp Laceration Length: 1.5 cm No Foreign Bodies seen or palpated Anesthesia: local infiltration Local anesthetic: lidocaine 2% with epinephrine Anesthetic total: 1.5 ml Irrigation method: syringe Amount of cleaning: standard Skin closure: staples Number of staples: 2 Technique: staple  gun Patient tolerance: Patient tolerated the procedure well with no immediate complications.    Labs Review Labs Reviewed - No data to display  Imaging Review No results found.   EKG Interpretation None      MDM   Final diagnoses:  Fall, initial encounter  Minor head injury without loss of consciousness, initial encounter  Scalp laceration, initial encounter  Hip pain, acute, right    Patients labs and/or radiological studies were reviewed and considered during the medical decision making and disposition process.  Results were also discussed with patient. Pt with acute on chronic hip pain with negative CT findings today for acute injury.  Patient was seen by Dr. Rennis ChrisJacobowitz during this ED visit.  Patient is stable for transport back to her skilled nursing home.  I personally performed the services described in this documentation, which was scribed in my presence. The recorded information has been reviewed and is accurate.   Burgess AmorJulie Analaura Messler, PA-C 06/12/14 0142  Doug SouSam Jacubowitz, MD 06/20/14 [phone]

## 2014-06-06 NOTE — ED Notes (Addendum)
Pt from [facility] and reported that pt fell from standing position, with lac to posterior head and c/o right hip pain, reported that right leg shorten; reported that pt was c/o neck pain and was placed in c-collar by [facility] PTA

## 2014-06-06 NOTE — Discharge Instructions (Signed)
Head Injury You have a head injury. Headaches and throwing up (vomiting) are common after a head injury. It should be easy to wake up from sleeping. Sometimes you must stay in the hospital. Most problems happen within the first 24 hours. Side effects may occur up to 7-10 days after the injury.  WHAT ARE THE TYPES OF HEAD INJURIES? Head injuries can be as minor as a bump. Some head injuries can be more severe. More severe head injuries include:  A jarring injury to the brain (concussion).  A bruise of the brain (contusion). This mean there is bleeding in the brain that can cause swelling.  A cracked skull (skull fracture).  Bleeding in the brain that collects, clots, and forms a bump (hematoma). WHEN SHOULD I GET HELP RIGHT AWAY?   You are confused or sleepy.  You cannot be woken up.  You feel sick to your stomach (nauseous) or keep throwing up (vomiting).  Your dizziness or unsteadiness is getting worse.  You have very bad, lasting headaches that are not helped by medicine. Take medicines only as told by your doctor.  You cannot use your arms or legs like normal.  You cannot walk.  You notice changes in the black spots in the center of the colored part of your eye (pupil).  You have clear or bloody fluid coming from your nose or ears.  You have trouble seeing. During the next 24 hours after the injury, you must stay with someone who can watch you. This person should get help right away (call 911 in the [place] if you start to shake and are not able to control it (have seizures), you pass out, or you are unable to wake up. HOW CAN I PREVENT A HEAD INJURY IN THE FUTURE?  Wear seat belts.  Wear a helmet while bike riding and playing sports like football.  Stay away from dangerous activities around the house. WHEN CAN I RETURN TO NORMAL ACTIVITIES AND ATHLETICS? See your doctor before doing these activities. You should not do normal activities or play contact sports until 1 week  after the following symptoms have stopped:  Headache that does not go away.  Dizziness.  Poor attention.  Confusion.  Memory problems.  Sickness to your stomach or throwing up.  Tiredness.  Fussiness.  Bothered by bright lights or loud noises.  Anxiousness or depression.  Restless sleep. MAKE SURE YOU:   Understand these instructions. Laceration Care, Adult A laceration is a cut that goes through all layers of the skin. The cut goes into the tissue beneath the skin. HOME CARE For stitches (sutures) or staples: Keep the cut clean and dry. If you have a bandage (dressing), change it at least once a day. Change the bandage if it gets wet or dirty, or as told by your doctor. Wash the cut with soap and water 2 times a day. Rinse the cut with water. Pat it dry with a clean towel. Put a thin layer of medicated cream on the cut as told by your doctor. You may shower after the first 24 hours. Do not soak the cut in water until the stitches are removed. Only take medicines as told by your doctor. Have your stitches or staples removed as told by your doctor. For skin adhesive strips: Keep the cut clean and dry. Do not get the strips wet. You may take a bath, but be careful to keep the cut dry. If the cut gets wet, pat it dry with a clean  towel. The strips will fall off on their own. Do not remove the strips that are still stuck to the cut. For wound glue: You may shower or take baths. Do not soak or scrub the cut. Do not swim. Avoid heavy sweating until the glue falls off on its own. After a shower or bath, pat the cut dry with a clean towel. Do not put medicine on your cut until the glue falls off. If you have a bandage, do not put tape over the glue. Avoid lots of sunlight or tanning lamps until the glue falls off. Put sunscreen on the cut for the first year to reduce your scar. The glue will fall off on its own. Do not pick at the glue. You may need a tetanus shot if: You  cannot remember when you had your last tetanus shot. You have never had a tetanus shot. If you need a tetanus shot and you choose not to have one, you may get tetanus. Sickness from tetanus can be serious. GET HELP RIGHT AWAY IF:  Your pain does not get better with medicine. Your arm, hand, leg, or foot loses feeling (numbness) or changes color. Your cut is bleeding. Your joint feels weak, or you cannot use your joint. You have painful lumps on your body. Your cut is red, puffy (swollen), or painful. You have a red line on the skin near the cut. You have yellowish-white fluid (pus) coming from the cut. You have a fever. You have a bad smell coming from the cut or bandage. Your cut breaks open before or after stitches are removed. You notice something coming out of the cut, such as wood or glass. You cannot move a finger or toe. MAKE SURE YOU:  Understand these instructions. Will watch your condition. Will get help right away if you are not doing well or get worse. Document Released: 08/29/2007 Document Revised: 06/04/2011 Document Reviewed: 09/05/2010 [facility] Patient Information 2015 [place], [place]. This information is not intended to replace advice given to you by your health care provider. Make sure you discuss any questions you have with your health care provider.   Will watch your condition.  Will get help right away if you are not doing well or get worse. Document Released: 02/23/2008 Document Revised: 07/27/2013 Document Reviewed: 11/17/2012 [facility] Patient Information 2015 [place], [place]. This information is not intended to replace advice given to you by your health care provider. Make sure you discuss any questions you have with your health care provider.

## 2015-05-05 ENCOUNTER — Other Ambulatory Visit: Payer: Self-pay | Admitting: Internal Medicine

## 2015-05-05 ENCOUNTER — Other Ambulatory Visit: Payer: Self-pay

## 2015-05-05 DIAGNOSIS — Z1231 Encounter for screening mammogram for malignant neoplasm of breast: Secondary | ICD-10-CM

## 2015-05-17 ENCOUNTER — Ambulatory Visit: Payer: Medicare Other

## 2015-05-19 ENCOUNTER — Ambulatory Visit
Admission: RE | Admit: 2015-05-19 | Discharge: 2015-05-19 | Disposition: A | Discharge status: Home / Self Care | Payer: Medicare Other | Source: Ambulatory Visit | Attending: Internal Medicine | Admitting: Internal Medicine

## 2015-05-19 DIAGNOSIS — Z1231 Encounter for screening mammogram for malignant neoplasm of breast: Secondary | ICD-10-CM

## 2016-01-13 ENCOUNTER — Encounter: Payer: Self-pay | Admitting: Vascular Surgery

## 2016-01-17 ENCOUNTER — Encounter: Payer: Self-pay | Admitting: Vascular Surgery

## 2016-01-17 ENCOUNTER — Ambulatory Visit (INDEPENDENT_AMBULATORY_CARE_PROVIDER_SITE_OTHER): Payer: Medicare Other | Admitting: Vascular Surgery

## 2016-01-17 VITALS — BP 91/57 | HR 63 | Temp 99.9°F | Resp 18 | Ht 66.0 in | Wt 121.0 lb

## 2016-01-17 DIAGNOSIS — L97511 Non-pressure chronic ulcer of other part of right foot limited to breakdown of skin: Secondary | ICD-10-CM

## 2016-01-17 NOTE — Progress Notes (Signed)
[facility] [place]  Patient name: [name] MRN: [id number] DOB: 1945-07-13 Sex: female  REASON FOR CONSULT: Evaluation of lower extremity arterial flow with right foot ulceration  HPI: [name] is a 70 y.o. female, who is resident of nursing home. She is in a wheelchair. She is with a caregiver from the nursing facility. She is able to give some history but most of her history is obtained from the caregiver. Patient has severe memory issues. She reports some discomfort regarding the ulceration of her lateral aspect of her right foot. She does have a prior hip fracture and surgery related to this with ongoing difficulty in walking. She is able to walk to the bathroom. She does not walk much other than around her room. She denies any pain in her left foot but does report tenderness when I palpate her calf on her right and left leg.  Past Medical History:  Diagnosis Date  . Anemia   . Anemia   . Anxiety and depression   . Bacteremia   . Cellulitis   . Chronic back pain   . Chronic diarrhea   . Chronic right hip pain   . Depression   . GERD (gastroesophageal reflux disease)   . Gout   . Gout   . Hypopotassemia   . IBS (irritable bowel syndrome)   . Muscle weakness (generalized)   . Osteoporosis   . Pressure ulcer of buttock   . Psychosis     History reviewed. No pertinent family history.  SOCIAL HISTORY: Social History   Social History  . Marital status: Single    Spouse name: N/A  . Number of children: N/A  . Years of education: N/A   Occupational History  . Not on file.   Social History Main Topics  . Smoking status: Former Games developer  . Smokeless tobacco: Never Used  . Alcohol use No  . Drug use: No  . Sexual activity: Not on file   Other Topics Concern  . Not on file   Social History Narrative  . No narrative on file    Allergies  Allergen Reactions  . Diphenhydramine Other (See  Comments)    Feels bad  . Tape Dermatitis    Current Outpatient Prescriptions  Medication Sig Dispense Refill  . acetaminophen (TYLENOL) 500 MG tablet Take 500 mg by mouth every 6 (six) hours as needed for mild pain.    Marland Kitchen acidophilus (RISAQUAD) CAPS capsule Take 1 capsule by mouth daily.    Marland Kitchen alum & mag hydroxide-simeth (MAALOX/MYLANTA) 200-200-20 MG/5ML suspension Take by mouth every 6 (six) hours as needed for indigestion or heartburn.    Marland Kitchen atorvastatin (LIPITOR) 10 MG tablet Take 10 mg by mouth daily.    Marland Kitchen buPROPion (WELLBUTRIN XL) 300 MG 24 hr tablet Take 300 mg by mouth daily.    . calcium carbonate (CALCIUM 600) 1500 (600 Ca) MG TABS tablet Take by mouth 2 (two) times daily with a meal.    . denosumab (PROLIA) 60 MG/ML SOLN injection Inject 60 mg into the skin every 6 (six) months. Administer in upper arm, thigh, or abdomen    . dicyclomine (BENTYL) 10 MG capsule Take 1 capsule (10 mg total) by mouth 4 (four) times daily -  before meals and at bedtime. 40 capsule 0  . gabapentin (NEURONTIN) 300 MG capsule Take 300 mg by mouth 3 (three) times daily.    Marland Kitchen levothyroxine (SYNTHROID, LEVOTHROID) 25 MCG tablet Take 25  mcg by mouth daily before breakfast.    . loperamide (IMODIUM) 2 MG capsule Take 2-4 mg by mouth 4 (four) times daily as needed. For diarrhea, take 2 tablets (4mg ) initially then take 2 mg after each loose stool    . LORazepam (ATIVAN) 0.5 MG tablet Take 0.5 mg by mouth daily.    Marland Kitchen LORazepam (ATIVAN) 1 MG tablet Take 1 mg by mouth 2 (two) times daily.    . mirtazapine (REMERON) 45 MG tablet Take 45 mg by mouth every evening.    Marland Kitchen morphine (MS CONTIN) 30 MG 12 hr tablet Take 30 mg by mouth every 12 (twelve) hours.    . Nutritional Supplements (RESOURCE 2.0) LIQD Take 120 mLs by mouth 2 (two) times daily.     Marland Kitchen OLANZapine (ZYPREXA) 15 MG tablet Take 15 mg by mouth at bedtime.    Marland Kitchen omeprazole (PRILOSEC) 20 MG capsule Take 20 mg by mouth daily.    Marland Kitchen oxybutynin (DITROPAN-XL) 5 MG 24  hr tablet Take 5 mg by mouth at bedtime.    . Oxycodone HCl 10 MG TABS Take 10 mg by mouth every 6 (six) hours as needed (moderate to severe pain).    . polyvinyl alcohol (LIQUIFILM TEARS) 1.4 % ophthalmic solution Place 1 drop into both eyes as needed for dry eyes.    Marland Kitchen senna-docusate (SENNA PLUS) 8.6-50 MG per tablet Take 2 tablets by mouth 2 (two) times daily.    Marland Kitchen Nystatin (NYAMYC) 100000 UNIT/GM POWD Apply 1 application topically 2 (two) times daily. Groin area for 5 days(started on 11/18/13 (evening)    . simvastatin (ZOCOR) 20 MG tablet Take 20 mg by mouth at bedtime.      No current facility-administered medications for this visit.     REVIEW OF SYSTEMS:  [X]  denotes positive finding, [ ]  denotes negative finding Cardiac  Comments:  Chest pain or chest pressure:    Shortness of breath upon exertion:    Short of breath when lying flat:    Irregular heart rhythm:        Vascular    Pain in calf, thigh, or hip brought on by ambulation:    Pain in feet at night that wakes you up from your sleep:     Blood clot in your veins:    Leg swelling:         Pulmonary    Oxygen at home:    Productive cough:     Wheezing:         Neurologic    Sudden weakness in arms or legs:     Sudden numbness in arms or legs:     Sudden onset of difficulty speaking or slurred speech:    Temporary loss of vision in one eye:     Problems with dizziness:         Gastrointestinal    Blood in stool:     Vomited blood:         Genitourinary    Burning when urinating:     Blood in urine:        Psychiatric    Major depression:  x       Hematologic    Bleeding problems:    Problems with blood clotting too easily:        Skin    Rashes or ulcers: x       Constitutional    Fever or chills:      PHYSICAL EXAM: Vitals:   01/17/16 1108  BP: (!) 91/57  Pulse: 63  Resp: 18  Temp: 99.9 F (37.7 C)  TempSrc: Oral  SpO2: 99%  Weight: 121 lb (54.9 kg)  Height: 5\' 6"  (1.676 m)     GENERAL: The patient is a well-nourished female, in no acute distress. The vital signs are documented above. CARDIOVASCULAR: Heart is regular rate and rhythm without murmur. 2+ radial and 2+ dorsalis pedis pulses bilaterally PULMONARY: There is good air exchange  ABDOMEN: Soft and non-tender  MUSCULOSKELETAL: There are no major deformities or cyanosis. NEUROLOGIC: No focal weakness or paresthesias are detected. SKIN: She does have superficial erosion of skin over her right lateral foot over a prominent bony area. No evidence of invasive infection PSYCHIATRIC: The patient has a normal affect.  DATA:  Reviewed her noninvasive studies from outlying hospital. This reveals multiple ankle arm index bilaterally. There was some question about potential calcification causing increased recordings.  MEDICAL ISSUES: Leeta Grimme healing ulceration over the right lateral foot. Her noninvasive studies are normal and she has completely normal dorsalis pedis pulses bilaterally. I discussed this with patient explaining that I feel that she is having appropriate treatment with local wound care. No role for more invasive evaluation since she does have normal pedal pulse. She was reassured with this discussion will continue local wound care to her foot ulcer.   Larina Earthlyodd F. Bekim Werntz, MD FACS [facility] Office Tel [phone] Pager [phone]

## 2016-03-13 ENCOUNTER — Ambulatory Visit (HOSPITAL_COMMUNITY)
Admission: RE | Admit: 2016-03-13 | Discharge: 2016-03-13 | Disposition: A | Discharge status: Home / Self Care | Payer: Medicare Other | Source: Ambulatory Visit | Attending: Geriatric Medicine | Admitting: Geriatric Medicine

## 2016-03-13 ENCOUNTER — Other Ambulatory Visit (HOSPITAL_COMMUNITY): Payer: Self-pay | Admitting: Geriatric Medicine

## 2016-03-13 DIAGNOSIS — Y92121 Bathroom in nursing home as the place of occurrence of the external cause: Secondary | ICD-10-CM

## 2016-03-13 DIAGNOSIS — W19XXXA Unspecified fall, initial encounter: Secondary | ICD-10-CM

## 2016-03-13 DIAGNOSIS — Z043 Encounter for examination and observation following other accident: Secondary | ICD-10-CM | POA: Insufficient documentation

## 2016-03-13 DIAGNOSIS — G319 Degenerative disease of nervous system, unspecified: Secondary | ICD-10-CM | POA: Diagnosis not present

## 2016-03-14 ENCOUNTER — Ambulatory Visit (HOSPITAL_COMMUNITY): Payer: Medicare Other

## 2016-04-19 ENCOUNTER — Other Ambulatory Visit (HOSPITAL_COMMUNITY): Payer: Self-pay | Admitting: Family Medicine

## 2016-04-19 DIAGNOSIS — Z1231 Encounter for screening mammogram for malignant neoplasm of breast: Secondary | ICD-10-CM

## 2016-05-16 ENCOUNTER — Encounter (HOSPITAL_COMMUNITY): Payer: Self-pay | Admitting: *Deleted

## 2016-05-16 ENCOUNTER — Inpatient Hospital Stay (HOSPITAL_COMMUNITY)
Admission: EM | Admit: 2016-05-16 | Discharge: 2016-05-21 | DRG: 193 | Disposition: A | Discharge status: Skilled Nursing Facility | Payer: Medicare Other | Attending: Internal Medicine | Admitting: Internal Medicine

## 2016-05-16 ENCOUNTER — Emergency Department (HOSPITAL_COMMUNITY): Payer: Medicare Other

## 2016-05-16 DIAGNOSIS — J1008 Influenza due to other identified influenza virus with other specified pneumonia: Principal | ICD-10-CM | POA: Diagnosis present

## 2016-05-16 DIAGNOSIS — R413 Other amnesia: Secondary | ICD-10-CM | POA: Diagnosis present

## 2016-05-16 DIAGNOSIS — G9340 Encephalopathy, unspecified: Secondary | ICD-10-CM | POA: Diagnosis present

## 2016-05-16 DIAGNOSIS — Z79891 Long term (current) use of opiate analgesic: Secondary | ICD-10-CM | POA: Diagnosis not present

## 2016-05-16 DIAGNOSIS — J189 Pneumonia, unspecified organism: Secondary | ICD-10-CM | POA: Diagnosis not present

## 2016-05-16 DIAGNOSIS — J101 Influenza due to other identified influenza virus with other respiratory manifestations: Secondary | ICD-10-CM

## 2016-05-16 DIAGNOSIS — Z96641 Presence of right artificial hip joint: Secondary | ICD-10-CM | POA: Diagnosis present

## 2016-05-16 DIAGNOSIS — E039 Hypothyroidism, unspecified: Secondary | ICD-10-CM | POA: Diagnosis present

## 2016-05-16 DIAGNOSIS — J111 Influenza due to unidentified influenza virus with other respiratory manifestations: Secondary | ICD-10-CM | POA: Diagnosis present

## 2016-05-16 DIAGNOSIS — M810 Age-related osteoporosis without current pathological fracture: Secondary | ICD-10-CM | POA: Diagnosis present

## 2016-05-16 DIAGNOSIS — D638 Anemia in other chronic diseases classified elsewhere: Secondary | ICD-10-CM | POA: Diagnosis not present

## 2016-05-16 DIAGNOSIS — K219 Gastro-esophageal reflux disease without esophagitis: Secondary | ICD-10-CM | POA: Diagnosis present

## 2016-05-16 DIAGNOSIS — Z87891 Personal history of nicotine dependence: Secondary | ICD-10-CM

## 2016-05-16 DIAGNOSIS — F329 Major depressive disorder, single episode, unspecified: Secondary | ICD-10-CM | POA: Diagnosis present

## 2016-05-16 DIAGNOSIS — F29 Unspecified psychosis not due to a substance or known physiological condition: Secondary | ICD-10-CM | POA: Diagnosis not present

## 2016-05-16 DIAGNOSIS — R509 Fever, unspecified: Secondary | ICD-10-CM | POA: Diagnosis present

## 2016-05-16 DIAGNOSIS — F32A Depression, unspecified: Secondary | ICD-10-CM | POA: Diagnosis present

## 2016-05-16 DIAGNOSIS — Y95 Nosocomial condition: Secondary | ICD-10-CM | POA: Diagnosis present

## 2016-05-16 DIAGNOSIS — F419 Anxiety disorder, unspecified: Secondary | ICD-10-CM | POA: Diagnosis present

## 2016-05-16 DIAGNOSIS — M549 Dorsalgia, unspecified: Secondary | ICD-10-CM | POA: Diagnosis present

## 2016-05-16 DIAGNOSIS — J9601 Acute respiratory failure with hypoxia: Secondary | ICD-10-CM | POA: Diagnosis not present

## 2016-05-16 DIAGNOSIS — G8929 Other chronic pain: Secondary | ICD-10-CM | POA: Diagnosis present

## 2016-05-16 DIAGNOSIS — F05 Delirium due to known physiological condition: Secondary | ICD-10-CM | POA: Diagnosis present

## 2016-05-16 DIAGNOSIS — D649 Anemia, unspecified: Secondary | ICD-10-CM | POA: Diagnosis present

## 2016-05-16 LAB — CBC WITH DIFFERENTIAL/PLATELET
BASOS PCT: 0 %
Basophils Absolute: 0 10*3/uL (ref 0.0–0.1)
Eosinophils Absolute: 0 10*3/uL (ref 0.0–0.7)
Eosinophils Relative: 0 %
HEMATOCRIT: 35.7 % — AB (ref 36.0–46.0)
Hemoglobin: 11.4 g/dL — ABNORMAL LOW (ref 12.0–15.0)
LYMPHS ABS: 0.6 10*3/uL — AB (ref 0.7–4.0)
Lymphocytes Relative: 13 %
MCH: 27.5 pg (ref 26.0–34.0)
MCHC: 31.9 g/dL (ref 30.0–36.0)
MCV: 86 fL (ref 78.0–100.0)
MONO ABS: 0.5 10*3/uL (ref 0.1–1.0)
MONOS PCT: 11 %
NEUTROS ABS: 3.3 10*3/uL (ref 1.7–7.7)
Neutrophils Relative %: 76 %
Platelets: 202 10*3/uL (ref 150–400)
RBC: 4.15 MIL/uL (ref 3.87–5.11)
RDW: 14.9 % (ref 11.5–15.5)
WBC: 4.4 10*3/uL (ref 4.0–10.5)

## 2016-05-16 LAB — COMPREHENSIVE METABOLIC PANEL
ALBUMIN: 3 g/dL — AB (ref 3.5–5.0)
ALK PHOS: 83 U/L (ref 38–126)
ALT: 21 U/L (ref 14–54)
ANION GAP: 9 (ref 5–15)
AST: 42 U/L — ABNORMAL HIGH (ref 15–41)
BUN: 21 mg/dL — ABNORMAL HIGH (ref 6–20)
CALCIUM: 8.8 mg/dL — AB (ref 8.9–10.3)
CO2: 27 mmol/L (ref 22–32)
Chloride: 103 mmol/L (ref 101–111)
Creatinine, Ser: 0.7 mg/dL (ref 0.44–1.00)
GFR calc Af Amer: >60 mL/min (ref >60)
GFR calc non Af Amer: >60 mL/min (ref >60)
GLUCOSE: 120 mg/dL — AB (ref 65–99)
Potassium: 3.6 mmol/L (ref 3.5–5.1)
Sodium: 139 mmol/L (ref 135–145)
Total Bilirubin: 0.5 mg/dL (ref 0.3–1.2)
Total Protein: 6.9 g/dL (ref 6.5–8.1)

## 2016-05-16 LAB — URINALYSIS, ROUTINE W REFLEX MICROSCOPIC
Bacteria, UA: NONE SEEN
Bilirubin Urine: NEGATIVE
GLUCOSE, UA: NEGATIVE mg/dL
HGB URINE DIPSTICK: NEGATIVE
Ketones, ur: NEGATIVE mg/dL
Leukocytes, UA: NEGATIVE
NITRITE: NEGATIVE
Protein, ur: 30 mg/dL — AB
Specific Gravity, Urine: 1.021 (ref 1.005–1.030)
pH: 5 (ref 5.0–8.0)

## 2016-05-16 LAB — STREP PNEUMONIAE URINARY ANTIGEN: STREP PNEUMO URINARY ANTIGEN: NEGATIVE

## 2016-05-16 LAB — I-STAT CG4 LACTIC ACID, ED: Lactic Acid, Venous: 1.45 mmol/L (ref 0.5–1.9)

## 2016-05-16 LAB — MRSA PCR SCREENING: MRSA BY PCR: NEGATIVE

## 2016-05-16 MED ORDER — ACETAMINOPHEN 325 MG PO TABS
650.0000 mg | ORAL_TABLET | Freq: Four times a day (QID) | ORAL | Status: DC | PRN
  Administered 2016-05-16: 650 mg via ORAL
  Filled 2016-05-16: qty 2

## 2016-05-16 MED ORDER — OXYBUTYNIN CHLORIDE ER 5 MG PO TB24
5.0000 mg | ORAL_TABLET | Freq: Every day | ORAL | Status: DC
  Administered 2016-05-16 – 2016-05-20 (×5): 5 mg via ORAL
  Filled 2016-05-16 (×5): qty 1

## 2016-05-16 MED ORDER — LEVOTHYROXINE SODIUM 50 MCG PO TABS
50.0000 ug | ORAL_TABLET | Freq: Every day | ORAL | Status: DC
  Administered 2016-05-16 – 2016-05-20 (×5): 50 ug via ORAL
  Filled 2016-05-16 (×6): qty 1

## 2016-05-16 MED ORDER — CALCIUM & MAGNESIUM CARBONATES 311-232 MG PO TABS: 1.0000 | ORAL_TABLET | Freq: Every day | ORAL | Status: DC

## 2016-05-16 MED ORDER — BUPROPION HCL ER (XL) 300 MG PO TB24
300.0000 mg | ORAL_TABLET | Freq: Every day | ORAL | Status: DC
  Administered 2016-05-16 – 2016-05-20 (×5): 300 mg via ORAL
  Filled 2016-05-16 (×6): qty 1

## 2016-05-16 MED ORDER — CHLORHEXIDINE GLUCONATE 0.12 % MT SOLN
15.0000 mL | Freq: Two times a day (BID) | OROMUCOSAL | Status: DC
  Administered 2016-05-16 – 2016-05-21 (×11): 15 mL via OROMUCOSAL
  Filled 2016-05-16 (×11): qty 15

## 2016-05-16 MED ORDER — DEXTROSE 5 % IV SOLN
2.0000 g | Freq: Once | INTRAVENOUS | Status: AC
  Administered 2016-05-16: 2 g via INTRAVENOUS
  Filled 2016-05-16: qty 2

## 2016-05-16 MED ORDER — VANCOMYCIN HCL IN DEXTROSE 1-5 GM/200ML-% IV SOLN
1000.0000 mg | Freq: Once | INTRAVENOUS | Status: AC
Start: 2016-05-16 — End: 2016-05-16
  Administered 2016-05-16: 1000 mg via INTRAVENOUS
  Filled 2016-05-16: qty 200

## 2016-05-16 MED ORDER — LORAZEPAM 1 MG PO TABS
1.0000 mg | ORAL_TABLET | Freq: Two times a day (BID) | ORAL | Status: DC
  Administered 2016-05-16 – 2016-05-20 (×9): 1 mg via ORAL
  Filled 2016-05-16 (×9): qty 1

## 2016-05-16 MED ORDER — VANCOMYCIN HCL 500 MG IV SOLR
500.0000 mg | Freq: Two times a day (BID) | INTRAVENOUS | Status: DC
  Administered 2016-05-16 – 2016-05-21 (×10): 500 mg via INTRAVENOUS
  Filled 2016-05-16 (×13): qty 500

## 2016-05-16 MED ORDER — POLYVINYL ALCOHOL 1.4 % OP SOLN
1.0000 [drp] | OPHTHALMIC | Status: DC | PRN
  Filled 2016-05-16: qty 15

## 2016-05-16 MED ORDER — ATORVASTATIN CALCIUM 10 MG PO TABS
10.0000 mg | ORAL_TABLET | Freq: Every day | ORAL | Status: DC
  Administered 2016-05-16 – 2016-05-20 (×5): 10 mg via ORAL
  Filled 2016-05-16 (×7): qty 1

## 2016-05-16 MED ORDER — SODIUM CHLORIDE 0.9 % IV SOLN
INTRAVENOUS | Status: AC
  Administered 2016-05-16: 06:00:00 via INTRAVENOUS

## 2016-05-16 MED ORDER — DEXTROSE 5 % IV SOLN
2.0000 g | Freq: Two times a day (BID) | INTRAVENOUS | Status: DC
  Administered 2016-05-16 – 2016-05-21 (×11): 2 g via INTRAVENOUS
  Filled 2016-05-16 (×13): qty 2

## 2016-05-16 MED ORDER — PRO-STAT SUGAR FREE PO LIQD
30.0000 mL | Freq: Two times a day (BID) | ORAL | Status: DC
  Administered 2016-05-16 – 2016-05-21 (×10): 30 mL via ORAL
  Filled 2016-05-16 (×9): qty 30

## 2016-05-16 MED ORDER — OXYCODONE HCL 5 MG PO TABS
10.0000 mg | ORAL_TABLET | Freq: Four times a day (QID) | ORAL | Status: DC | PRN
  Administered 2016-05-17 – 2016-05-18 (×3): 10 mg via ORAL
  Filled 2016-05-16 (×4): qty 2

## 2016-05-16 MED ORDER — SODIUM CHLORIDE 0.9 % IV BOLUS (SEPSIS)
1000.0000 mL | Freq: Once | INTRAVENOUS | Status: AC
  Administered 2016-05-16: 1000 mL via INTRAVENOUS

## 2016-05-16 MED ORDER — ENOXAPARIN SODIUM 40 MG/0.4ML ~~LOC~~ SOLN
40.0000 mg | SUBCUTANEOUS | Status: DC
  Administered 2016-05-16 – 2016-05-21 (×6): 40 mg via SUBCUTANEOUS
  Filled 2016-05-16 (×6): qty 0.4

## 2016-05-16 MED ORDER — RISAQUAD PO CAPS
1.0000 | ORAL_CAPSULE | Freq: Every day | ORAL | Status: DC
  Administered 2016-05-16 – 2016-05-20 (×5): 1 via ORAL
  Filled 2016-05-16 (×6): qty 1

## 2016-05-16 MED ORDER — DM-GUAIFENESIN ER 30-600 MG PO TB12: 2.0000 | ORAL_TABLET | Freq: Two times a day (BID) | ORAL | Status: DC | PRN

## 2016-05-16 MED ORDER — MIRTAZAPINE 15 MG PO TABS
45.0000 mg | ORAL_TABLET | Freq: Every evening | ORAL | Status: DC
  Administered 2016-05-16 – 2016-05-20 (×5): 45 mg via ORAL
  Filled 2016-05-16 (×5): qty 3

## 2016-05-16 MED ORDER — MORPHINE SULFATE ER 30 MG PO TBCR
30.0000 mg | EXTENDED_RELEASE_TABLET | Freq: Two times a day (BID) | ORAL | Status: DC
  Administered 2016-05-16 – 2016-05-20 (×10): 30 mg via ORAL
  Filled 2016-05-16 (×11): qty 1

## 2016-05-16 MED ORDER — METHYLPREDNISOLONE SODIUM SUCC 40 MG IJ SOLR
40.0000 mg | Freq: Two times a day (BID) | INTRAMUSCULAR | Status: DC
  Administered 2016-05-16 – 2016-05-20 (×9): 40 mg via INTRAVENOUS
  Filled 2016-05-16 (×9): qty 1

## 2016-05-16 MED ORDER — ALUM & MAG HYDROXIDE-SIMETH 200-200-20 MG/5ML PO SUSP: 15.0000 mL | Freq: Four times a day (QID) | ORAL | Status: DC | PRN

## 2016-05-16 MED ORDER — OSELTAMIVIR PHOSPHATE 75 MG PO CAPS
75.0000 mg | ORAL_CAPSULE | Freq: Two times a day (BID) | ORAL | Status: AC
  Administered 2016-05-16 – 2016-05-20 (×10): 75 mg via ORAL
  Filled 2016-05-16 (×10): qty 1

## 2016-05-16 MED ORDER — SENNOSIDES-DOCUSATE SODIUM 8.6-50 MG PO TABS
2.0000 | ORAL_TABLET | Freq: Two times a day (BID) | ORAL | Status: DC
  Administered 2016-05-16 – 2016-05-20 (×10): 2 via ORAL
  Filled 2016-05-16 (×11): qty 2

## 2016-05-16 MED ORDER — GABAPENTIN 300 MG PO CAPS
300.0000 mg | ORAL_CAPSULE | Freq: Three times a day (TID) | ORAL | Status: DC
  Administered 2016-05-16 – 2016-05-20 (×15): 300 mg via ORAL
  Filled 2016-05-16 (×6): qty 1
  Filled 2016-05-16: qty 3
  Filled 2016-05-16 (×8): qty 1
  Filled 2016-05-16: qty 3

## 2016-05-16 MED ORDER — BOOST / RESOURCE BREEZE PO LIQD
1.0000 | Freq: Two times a day (BID) | ORAL | Status: DC
  Administered 2016-05-16 – 2016-05-18 (×5): 1 via ORAL
  Administered 2016-05-19: 21:00:00 via ORAL
  Administered 2016-05-19: 1 via ORAL
  Administered 2016-05-20: 22:00:00 via ORAL
  Administered 2016-05-20: 1 via ORAL

## 2016-05-16 MED ORDER — PANTOPRAZOLE SODIUM 40 MG PO TBEC
40.0000 mg | DELAYED_RELEASE_TABLET | Freq: Every day | ORAL | Status: DC
  Administered 2016-05-16 – 2016-05-20 (×5): 40 mg via ORAL
  Filled 2016-05-16 (×6): qty 1

## 2016-05-16 MED ORDER — OLANZAPINE 5 MG PO TABS
15.0000 mg | ORAL_TABLET | Freq: Every day | ORAL | Status: DC
  Administered 2016-05-16 – 2016-05-20 (×5): 15 mg via ORAL
  Filled 2016-05-16 (×5): qty 3

## 2016-05-16 MED ORDER — DICYCLOMINE HCL 10 MG PO CAPS
10.0000 mg | ORAL_CAPSULE | Freq: Three times a day (TID) | ORAL | Status: DC
  Administered 2016-05-16 – 2016-05-20 (×19): 10 mg via ORAL
  Filled 2016-05-16 (×21): qty 1

## 2016-05-16 NOTE — Progress Notes (Signed)
Pharmacy Antibiotic Note  [name] is a 71 y.o. female admitted on 05/16/2016 with pneumonCheron Stewart.  Pharmacy has been consulted for Vancomycin and Cefepime dosing.  Plan: Vancomycin 1000mg  load in ED then 500mg  IV every 12 hours.  Goal trough 15-20 mcg/mL.  Cefepime 2gm IV q12h F/U cxs and clinical progress Monitor labs, V/S and levels as indicated  Height: 5\' 7"  (170.2 cm) Weight: 126 lb (57.2 kg) IBW/kg (Calculated) : 61.6  Temp (24hrs), Avg:101.3 F (38.5 C), Min:99.5 F (37.5 C), Max:103.1 F (39.5 C)   Recent Labs Lab 05/16/16 0200 05/16/16 0212  WBC 4.4  --   CREATININE 0.70  --   LATICACIDVEN  --  1.45    Estimated Creatinine Clearance: 58.2 mL/min (by C-G formula based on SCr of 0.7 mg/dL).    Allergies  Allergen Reactions  . Diphenhydramine Other (See Comments)    Feels bad  . Tape Dermatitis    Antimicrobials this admission: Vancomycin 2/21 >>  Cefepime 2/21 >>   Dose adjustments this admission: N/A  Microbiology results: 2/21 BCx: pending 2/21 MRSA PCR: negative  Thank you for allowing pharmacy to be a part of this patient's care.  Terri CyphersLorie Kennady Stewart, BS Pharm D, [place] Clinical Pharmacist Pager [phone] 05/16/2016 10:34 AM

## 2016-05-16 NOTE — Progress Notes (Signed)
Pt arrived to room 317 from ED at 0445am. Pt responsive to voice at this time and does answer to her name. Pt only oriented to her name at this time. RN tried asking pt admission questions with some incomprehensible speech at times. Complete admission assessment unable to get done d/t pt not being oriented at this time. CN made aware.

## 2016-05-16 NOTE — NC FL2 (Deleted)
[place] MEDICAID FL2 LEVEL OF CARE SCREENING TOOL     IDENTIFICATION  Patient Name: [name] Birthdate: [birth year] Sex: female Admission Date (Current Location): 05/16/2016  [place]:  [place] [id number] [facility]  [facility],  [address], [place] [id number]      Provider Number: [id number]  Attending Physician Name and Address:  Avon Gullyesfaye Fanta, MD  Relative Name and Phone Number:       Current Level of Care: Hospital Recommended Level of Care: Skilled Nursing Facility Prior Approval Number:    Date Approved/Denied:   PASRR Number: [id number]  Discharge Plan: SNF    Current Diagnoses: Patient Active Problem List   Diagnosis Date Noted  . Healthcare-associated pneumonia 05/16/2016  . Acute respiratory failure with hypoxia (HCC) 05/16/2016  . Acute encephalopathy 05/16/2016  . Hypothyroidism 05/16/2016  . Influenza 05/16/2016  . Memory deficits 05/16/2016  . 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 Mechanical complication of internal orthopedic implant (HCC) 02/28/2012  . Cellulitis of right hip 01/13/2012  . Psychosis 01/13/2012  . Depression 01/13/2012  . Gout 01/13/2012  . Chronic pain 01/13/2012  . Decubitus ulcer of left buttock, stage 4 (HCC) 01/13/2012  . IBS (irritable bowel syndrome) 01/13/2012  . Hypokalemia 01/13/2012  . Anemia, chronic disease 01/13/2012  . Herniated disc 12/05/2011  . Seroma complicating a procedure 06/05/2011  . 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 Wound infection after surgery 05/22/2011  . Fall 04/30/2011  . Hip fracture, right (HCC) 04/30/2011  . CLOSED FRACTURE OF HEAD OF RADIUS 01/09/2010  . SUBLUXATION-RADIAL HEAD 01/09/2010    Orientation RESPIRATION BLADDER Height & Weight     Self  O2 (2 L) Incontinent Weight: 126 lb (57.2 kg) Height:  5\' 7"  (170.2 cm)  BEHAVIORAL SYMPTOMS/MOOD NEUROLOGICAL BOWEL NUTRITION  STATUS  Other (Comment) (none)  (n/a) Incontinent Diet (Regular. See d/c summary for updates.)  AMBULATORY STATUS COMMUNICATION OF NEEDS Skin   Extensive Assist Verbally Other (Comment) (Deep tissue injury, redness to mid sacrum with foam dressing)                       Personal Care Assistance Level of Assistance  Bathing, Feeding Bathing Assistance: Maximum assistance Feeding assistance: Maximum assistance Dressing Assistance: Maximum assistance     Functional Limitations Info  Sight, Hearing, Speech Sight Info: Adequate Hearing Info: Adequate Speech Info: Adequate    SPECIAL CARE FACTORS FREQUENCY                       Contractures      Additional Factors Info  Code Status, Allergies, Isolation Precautions Code Status Info: Full code Allergies Info: Diphenhydramine, Tape     Isolation Precautions Info: Droplet precautions     Current Medications (05/16/2016):  This is the current hospital active medication list Current Facility-Administered Medications  Medication Dose Route Frequency Provider Last Rate Last Dose  . acetaminophen (TYLENOL) tablet 650 mg  650 mg Oral Q6H PRN Briscoe Deutscherimothy S Opyd, MD      . acidophilus (RISAQUAD) capsule 1 capsule  1 capsule Oral Daily Briscoe Deutscherimothy S Opyd, MD   1 capsule at 05/16/16 0915  . alum & mag hydroxide-simeth (MAALOX/MYLANTA) 200-200-20 MG/5ML suspension 15 mL  15 mL Oral Q6H PRN Lavone Neriimothy S Opyd, MD      . atorvastatin (LIPITOR) tablet 10 mg  10 mg Oral Daily Briscoe Deutscherimothy S Opyd, MD   10  mg at 05/16/16 0915  . buPROPion (WELLBUTRIN XL) 24 hr tablet 300 mg  300 mg Oral Daily Briscoe Deutscher, MD   300 mg at 05/16/16 0915  . ceFEPIme (MAXIPIME) 2 g in dextrose 5 % 50 mL IVPB  2 g Intravenous Q12H Avon Gully, MD      . chlorhexidine (PERIDEX) 0.12 % solution 15 mL  15 mL Mouth/Throat BID Briscoe Deutscher, MD   15 mL at 05/16/16 0914  . dextromethorphan-guaiFENesin (MUCINEX DM) 30-600 MG per 12 hr tablet 2 tablet  2 tablet Oral BID PRN  Briscoe Deutscher, MD      . dicyclomine (BENTYL) capsule 10 mg  10 mg Oral TID AC & HS Briscoe Deutscher, MD   10 mg at 05/16/16 1156  . enoxaparin (LOVENOX) injection 40 mg  40 mg Subcutaneous Q24H Briscoe Deutscher, MD   40 mg at 05/16/16 0915  . feeding supplement (BOOST / RESOURCE BREEZE) liquid 1 Container  1 Container Oral BID Briscoe Deutscher, MD   1 Container at 05/16/16 [phone]  . gabapentin (NEURONTIN) capsule 300 mg  300 mg Oral TID Briscoe Deutscher, MD   300 mg at 05/16/16 0914  . levothyroxine (SYNTHROID, LEVOTHROID) tablet 50 mcg  50 mcg Oral QAC breakfast Briscoe Deutscher, MD   50 mcg at 05/16/16 0737  . LORazepam (ATIVAN) tablet 1 mg  1 mg Oral BID Briscoe Deutscher, MD   1 mg at 05/16/16 0915  . methylPREDNISolone sodium succinate (SOLU-MEDROL) 40 mg/mL injection 40 mg  40 mg Intravenous Q12H Kari Baars, MD   40 mg at 05/16/16 0914  . mirtazapine (REMERON) tablet 45 mg  45 mg Oral QPM Lavone Neri Opyd, MD      . morphine (MS CONTIN) 12 hr tablet 30 mg  30 mg Oral Q12H Briscoe Deutscher, MD   30 mg at 05/16/16 0915  . OLANZapine (ZYPREXA) tablet 15 mg  15 mg Oral QHS Lavone Neri Opyd, MD      . oseltamivir (TAMIFLU) capsule 75 mg  75 mg Oral BID Briscoe Deutscher, MD   75 mg at 05/16/16 0915  . oxybutynin (DITROPAN-XL) 24 hr tablet 5 mg  5 mg Oral QHS Timothy S Opyd, MD      . oxyCODONE (Oxy IR/ROXICODONE) immediate release tablet 10 mg  10 mg Oral Q6H PRN Briscoe Deutscher, MD      . pantoprazole (PROTONIX) EC tablet 40 mg  40 mg Oral Daily Briscoe Deutscher, MD   40 mg at 05/16/16 0915  . polyvinyl alcohol (LIQUIFILM TEARS) 1.4 % ophthalmic solution 1 drop  1 drop Both Eyes PRN Briscoe Deutscher, MD      . senna-docusate (Senokot-S) tablet 2 tablet  2 tablet Oral BID Briscoe Deutscher, MD   2 tablet at 05/16/16 0915  . vancomycin (VANCOCIN) 500 mg in sodium chloride 0.9 % 100 mL IVPB  500 mg Intravenous Q12H Avon Gully, MD         Discharge Medications: Please see discharge summary for a list of discharge  medications.  Relevant Imaging Results:  Relevant Lab Results:   Additional Information Legal guardian Zebedee Iba.   Derenda Fennel [place], [place] [phone]

## 2016-05-16 NOTE — ED Provider Notes (Signed)
[facility] Provider Note   CSN: [id number] Arrival date & time: 05/16/16  0130     History   Chief Complaint Chief Complaint  Patient presents with  . Altered Mental Status    HPI [name] is a 71 y.o. female.  Patient sent to the emergency department by EMS from nursing home for evaluation of high fever and altered mental status. Patient has been ill for an unknown period of time, however, was diagnosed with influenza B antigen pneumonia yesterday at the nursing home. Staff became concerned about decreased alertness and high fever, sent for evaluation in the ER. At arrival, patient appears weak, ill, not answering questions. Level V Caveat due to acuity.      Past Medical History:  Diagnosis Date  . Anemia   . Anemia   . Anxiety and depression   . Bacteremia   . Cellulitis   . Chronic back pain   . Chronic diarrhea   . Chronic right hip pain   . Depression   . GERD (gastroesophageal reflux disease)   . Gout   . Gout   . Hypopotassemia   . IBS (irritable bowel syndrome)   . Muscle weakness (generalized)   . Osteoporosis   . Pressure ulcer of buttock   . Psychosis     Patient Active Problem List   Diagnosis Date Noted  . 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 Mechanical complication of internal orthopedic implant (HCC) 02/28/2012  . Cellulitis of right hip 01/13/2012  . Psychosis 01/13/2012  . Depression 01/13/2012  . Gout 01/13/2012  . Chronic pain 01/13/2012  . Decubitus ulcer of left buttock, stage 4 (HCC) 01/13/2012  . IBS (irritable bowel syndrome) 01/13/2012  . Hypokalemia 01/13/2012  . Anemia, chronic disease 01/13/2012  . Herniated disc 12/05/2011  . Seroma complicating a procedure 06/05/2011  . 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 Wound infection after surgery 05/22/2011  . Fall 04/30/2011  . Hip fracture, right (HCC) 04/30/2011  . CLOSED FRACTURE OF HEAD OF RADIUS 01/09/2010  .  SUBLUXATION-RADIAL HEAD 01/09/2010    Past Surgical History:  Procedure Laterality Date  . HIP ARTHROPLASTY  05/01/2011   Procedure: right ARTHROPLASTY BIPOLAR HIP;  Surgeon: Fuller Canada, MD;  Location: [facility];  Service: Orthopedics;  Laterality: Right;  . INCISION AND DRAINAGE HIP  02/08/2012   Procedure: IRRIGATION AND DEBRIDEMENT HIP;  Surgeon: Vickki Hearing, MD;  Location: [facility];  Service: Orthopedics;  Laterality: Right;  . INCISION AND DRAINAGE OF WOUND  05/24/2011   Procedure: IRRIGATION AND DEBRIDEMENT WOUND;  Surgeon: Fuller Canada, MD;  Location: [facility];  Service: Orthopedics;  Laterality: Right;    OB History    No data available       Home Medications    Prior to Admission medications   Medication Sig Start Date End Date Taking? Authorizing Provider  acidophilus (RISAQUAD) CAPS capsule Take 1 capsule by mouth daily.   Yes Historical Provider, MD  atorvastatin (LIPITOR) 10 MG tablet Take 10 mg by mouth daily.   Yes Historical Provider, MD  buPROPion (WELLBUTRIN XL) 300 MG 24 hr tablet Take 300 mg by mouth daily.   Yes Historical Provider, MD  calcium & magnesium carbonates (MYLANTA) 409-811 MG tablet Take 1 tablet by mouth daily.   Yes Historical Provider, MD  calcium carbonate (CALCIUM 600) 1500 (600 Ca) MG TABS tablet Take by mouth 2 (two) times daily with a meal.   Yes Historical  Provider, MD  chlorhexidine (PERIDEX) 0.12 % solution Use as directed 15 mLs in the mouth or throat 2 (two) times daily.   Yes Historical Provider, MD  denosumab (PROLIA) 60 MG/ML SOLN injection Inject 60 mg into the skin every 6 (six) months. Administer in upper arm, thigh, or abdomen   Yes Historical Provider, MD  Dextromethorphan-Guaifenesin (MUCINEX DM) 30-600 MG TB12 Take 2 tablets by mouth 2 (two) times daily as needed.   Yes Historical Provider, MD  gabapentin (NEURONTIN) 300 MG capsule Take 300 mg by mouth 3 (three) times daily.   Yes Historical Provider, MD  ibuprofen  (ADVIL,MOTRIN) 200 MG tablet Take 600 mg by mouth every 8 (eight) hours as needed.   Yes Historical Provider, MD  levofloxacin (LEVAQUIN) 750 MG tablet Take 750 mg by mouth daily.   Yes Historical Provider, MD  levothyroxine (SYNTHROID, LEVOTHROID) 25 MCG tablet Take 50 mcg by mouth daily before breakfast.    Yes Historical Provider, MD  LORazepam (ATIVAN) 0.5 MG tablet Take 0.5 mg by mouth daily.   Yes Historical Provider, MD  morphine (MS CONTIN) 30 MG 12 hr tablet Take 30 mg by mouth every 12 (twelve) hours.   Yes Historical Provider, MD  OLANZapine (ZYPREXA) 15 MG tablet Take 15 mg by mouth at bedtime.   Yes Historical Provider, MD  omeprazole (PRILOSEC) 20 MG capsule Take 20 mg by mouth daily.   Yes Historical Provider, MD  oseltamivir (TAMIFLU) 75 MG capsule Take 75 mg by mouth daily. 05/15/16 05/20/16 Yes Historical Provider, MD  oxybutynin (DITROPAN-XL) 5 MG 24 hr tablet Take 5 mg by mouth at bedtime.   Yes Historical Provider, MD  Oxycodone HCl 10 MG TABS Take 10 mg by mouth every 6 (six) hours as needed (moderate to severe pain).   Yes Historical Provider, MD  senna-docusate (SENNA PLUS) 8.6-50 MG per tablet Take 2 tablets by mouth 2 (two) times daily.   Yes Historical Provider, MD  acetaminophen (TYLENOL) 500 MG tablet Take 500 mg by mouth every 6 (six) hours as needed for mild pain.    Historical Provider, MD  alum & mag hydroxide-simeth (MAALOX/MYLANTA) 200-200-20 MG/5ML suspension Take by mouth every 6 (six) hours as needed for indigestion or heartburn.    Historical Provider, MD  dicyclomine (BENTYL) 10 MG capsule Take 1 capsule (10 mg total) by mouth 4 (four) times daily -  before meals and at bedtime. 05/04/11   Vickki Hearing, MD  loperamide (IMODIUM) 2 MG capsule Take 2-4 mg by mouth 4 (four) times daily as needed. For diarrhea, take 2 tablets (4mg ) initially then take 2 mg after each loose stool    Historical Provider, MD  LORazepam (ATIVAN) 1 MG tablet Take 1 mg by mouth 2 (two)  times daily.    Historical Provider, MD  mirtazapine (REMERON) 45 MG tablet Take 45 mg by mouth every evening.    Historical Provider, MD  Nutritional Supplements (RESOURCE 2.0) LIQD Take 120 mLs by mouth 2 (two) times daily.     Historical Provider, MD  Nystatin (NYAMYC) 100000 UNIT/GM POWD Apply 1 application topically 2 (two) times daily. Groin area for 5 days(started on 11/18/13 (evening) 11/18/13   Historical Provider, MD  polyvinyl alcohol (LIQUIFILM TEARS) 1.4 % ophthalmic solution Place 1 drop into both eyes as needed for dry eyes.    Historical Provider, MD  simvastatin (ZOCOR) 20 MG tablet Take 20 mg by mouth at bedtime.     Historical Provider, MD    [facility]  History History reviewed. No pertinent family history.  Social History Social History  Substance Use Topics  . Smoking status: Former Games developer  . Smokeless tobacco: Never Used  . Alcohol use No     Allergies   Diphenhydramine and Tape   Review of Systems Review of Systems  Unable to perform ROS: Acuity of condition     Physical Exam Updated Vital Signs BP 98/56   Pulse 88   Temp (!) 103.1 F (39.5 C) (Rectal)   Resp 22   Ht 5\' 3"  (1.6 m)   Wt 130 lb (59 kg)   SpO2 99%   BMI 23.03 kg/m   Physical Exam  Constitutional: She appears well-developed and well-nourished. No distress.  HENT:  Head: Normocephalic and atraumatic.  Right Ear: Hearing normal.  Left Ear: Hearing normal.  Nose: Nose normal.  Mouth/Throat: Oropharynx is clear and moist and mucous membranes are normal.  Eyes: Conjunctivae and EOM are normal. Pupils are equal, round, and reactive to light.  Neck: Normal range of motion. Neck supple.  Cardiovascular: Regular rhythm, S1 normal and S2 normal.  Tachycardia present.  Exam reveals no gallop and no friction rub.   No murmur heard. Pulmonary/Chest: Effort normal. No respiratory distress. She has decreased breath sounds. She has rhonchi. She exhibits no tenderness.  Abdominal: Soft. Normal  appearance and bowel sounds are normal. There is no hepatosplenomegaly. There is no tenderness. There is no rebound, no guarding, no tenderness at McBurney's point and negative Murphy's sign. No hernia.  Musculoskeletal: Normal range of motion.  Neurological: She is alert. She has normal strength. No cranial nerve deficit or sensory deficit. Coordination normal.  Skin: Skin is warm, dry and intact. No rash noted. No cyanosis.  Nursing note and vitals reviewed.    ED Treatments / Results  Labs (all labs ordered are listed, but only abnormal results are displayed) Labs Reviewed  COMPREHENSIVE METABOLIC PANEL - Abnormal; Notable for the following:       Result Value   Glucose, Bld 120 (*)    BUN 21 (*)    Calcium 8.8 (*)    Albumin 3.0 (*)    AST 42 (*)    All other components within normal limits  CBC WITH DIFFERENTIAL/PLATELET - Abnormal; Notable for the following:    Hemoglobin 11.4 (*)    HCT 35.7 (*)    Lymphs Abs 0.6 (*)    All other components within normal limits  URINALYSIS, ROUTINE W REFLEX MICROSCOPIC - Abnormal; Notable for the following:    Color, Urine AMBER (*)    APPearance HAZY (*)    Protein, ur 30 (*)    All other components within normal limits  CULTURE, BLOOD (ROUTINE X 2)  CULTURE, BLOOD (ROUTINE X 2)  I-STAT CG4 LACTIC ACID, ED    EKG  EKG Interpretation  Date/Time:  Wednesday May 16 2016 01:51:34 EST Ventricular Rate:  101 PR Interval:    QRS Duration: 85 QT Interval:  333 QTC Calculation: 430 R Axis:   18 Text Interpretation:  Sinus tachycardia Ventricular premature complex Aberrant conduction of SV complex(es) Abnormal R-wave progression, early transition Borderline T abnormalities, inferior leads No significant change since last tracing Confirmed by Blinda Leatherwood  MD, CHRISTOPHER [phone]) on 05/16/2016 2:01:05 AM       Radiology Dg Chest Port 1 View  Result Date: 05/16/2016 CLINICAL DATA:  71 year old female with fever. EXAM: PORTABLE CHEST 1  VIEW COMPARISON:  Chest radiograph dated 03/28/2013 FINDINGS: Patchy areas of airspace density in  the right mid and lower lung field as well as at the left lung base noted. Mild interstitial prominence may represent mild edema. There is no pleural effusion or pneumothorax. Stable mild cardiomegaly. No acute osseous pathology. IMPRESSION: Bilateral hazy airspace densities concerning for infiltrate. Clinical correlation and follow-up recommended. Electronically Signed   By: Elgie CollardArash  Radparvar M.D.   On: 05/16/2016 02:32    Procedures Procedures (including critical care time)  Medications Ordered in ED Medications  ceFEPIme (MAXIPIME) 2 g in dextrose 5 % 50 mL IVPB (not administered)  vancomycin (VANCOCIN) IVPB 1000 mg/200 mL premix (1,000 mg Intravenous New Bag/Given 05/16/16 0233)  sodium chloride 0.9 % bolus 1,000 mL (1,000 mLs Intravenous New Bag/Given 05/16/16 0222)    And  sodium chloride 0.9 % bolus 1,000 mL (1,000 mLs Intravenous New Bag/Given 05/16/16 0222)     Initial Impression / Assessment and Plan / ED Course  I have reviewed the triage vital signs and the nursing notes.  Pertinent labs & imaging results that were available during my care of the patient were reviewed by me and considered in my medical decision making (see chart for details).     Patient presents to the emergency department for evaluation of altered mental status. Patient has a febrile illness. Rectal temp is 103.1. Patient did have borderline hypotension and altered mental status. She was diagnosed with influenza B and pneumonia yesterday at the nursing home. She was initiated on sepsis treatment. Lactate is normal, no evidence of severe sepsis. Covered with broad-spectrum antibiotics for pneumonia. Patient administered IV fluid resuscitation. Lab work is otherwise unremarkable. Cultures pending. Will hospitalize for increased oxygen demand in a nursing home patient with pneumonia.  Final Clinical Impressions(s) / ED  Diagnoses   Final diagnoses:  Influenza B  Healthcare-associated pneumonia    New Prescriptions New Prescriptions   No medications on file     Gilda Creasehristopher J Pollina, MD 05/16/16 [phone]

## 2016-05-16 NOTE — H&P (Signed)
History and Physical    JARA FEIDER ZOX:[id number] DOB: September 09, 1945 DOA: 05/16/2016  PCP: Avon Gully, MD   Patient coming from: [facility]  Chief Complaint: Fevers, AMS, dx flu and PNA yesterday   HPI: PRESLEE REGAS is a 71 y.o. female with medical history significant for depression with psychosis, memory deficits, chronic pain on opiates, hypothyroidism, and GERD who presents from her nursing home for evaluation of high fevers and altered mental status after being diagnosed with influenza and bacterial pneumonia yesterday. Patient is unable to contribute to the history secondary to her memory deficits and superimposed acute encephalopathy. She had reportedly been in her usual state until developing fevers and respiratory symptoms recently at the nursing facility. She was reportedly diagnosed with influenza and pneumonia yesterday and she was started on Tamiflu and Levaquin. Overnight, staff became concerned with persistent high fevers and increasing confusion and lethargy. There is no recent fall or trauma reported and there has been no vomiting or diarrhea. Patient has not voiced any pain complaints. She does not require supplemental oxygen at her baseline.  ED Course: Upon arrival to the ED, patient is found to be febrile to 39.5 C, saturating mid to high 80s on room air, mildly tachycardic, and with soft blood pressure. EKG features a sinus tachycardia with rate 101, PVCs, and early R transition. Chest x-ray is notable for bilateral hazy airspace densities concerning for pneumonia. Chemistry panel features and elevated BUN to creatinine ratio and CBC is notable for a stable mild normocytic anemia with hemoglobin of 11.4. Blood cultures were obtained, patient was treated with 30 cc/kg bolus of normal saline, and she was started on empiric vancomycin and cefepime for apparent HCAP. Tachycardia resolved with the IV fluids, but blood pressure remains soft and patient remains lethargic and confused.  She will be admitted to the telemetry unit for ongoing evaluation and management of influenza with HCAP and acute hypoxic respiratory failure.  Review of Systems:  Unable to obtain complete ROS secondary to patient's clinical condition with memory deficits and acute encephalopathy.  Past Medical History:  Diagnosis Date  . Anemia   . Anemia   . Anxiety and depression   . Bacteremia   . Cellulitis   . Chronic back pain   . Chronic diarrhea   . Chronic right hip pain   . Depression   . GERD (gastroesophageal reflux disease)   . Gout   . Gout   . Hypopotassemia   . IBS (irritable bowel syndrome)   . Muscle weakness (generalized)   . Osteoporosis   . Pressure ulcer of buttock   . Psychosis     Past Surgical History:  Procedure Laterality Date  . HIP ARTHROPLASTY  05/01/2011   Procedure: right ARTHROPLASTY BIPOLAR HIP;  Surgeon: Fuller Canada, MD;  Location: [facility];  Service: Orthopedics;  Laterality: Right;  . INCISION AND DRAINAGE HIP  02/08/2012   Procedure: IRRIGATION AND DEBRIDEMENT HIP;  Surgeon: Vickki Hearing, MD;  Location: [facility];  Service: Orthopedics;  Laterality: Right;  . INCISION AND DRAINAGE OF WOUND  05/24/2011   Procedure: IRRIGATION AND DEBRIDEMENT WOUND;  Surgeon: Fuller Canada, MD;  Location: [facility];  Service: Orthopedics;  Laterality: Right;     reports that she has quit smoking. She has never used smokeless tobacco. She reports that she does not drink alcohol or use drugs.  Allergies  Allergen Reactions  . Diphenhydramine Other (See Comments)    Feels bad  . Tape Dermatitis    History  reviewed. No pertinent family history.   Prior to Admission medications   Medication Sig Start Date End Date Taking? Authorizing Provider  acidophilus (RISAQUAD) CAPS capsule Take 1 capsule by mouth daily.   Yes Historical Provider, MD  atorvastatin (LIPITOR) 10 MG tablet Take 10 mg by mouth daily.   Yes Historical Provider, MD  buPROPion (WELLBUTRIN XL) 300  MG 24 hr tablet Take 300 mg by mouth daily.   Yes Historical Provider, MD  calcium & magnesium carbonates (MYLANTA) 161-096 MG tablet Take 1 tablet by mouth daily.   Yes Historical Provider, MD  calcium carbonate (CALCIUM 600) 1500 (600 Ca) MG TABS tablet Take by mouth 2 (two) times daily with a meal.   Yes Historical Provider, MD  chlorhexidine (PERIDEX) 0.12 % solution Use as directed 15 mLs in the mouth or throat 2 (two) times daily.   Yes Historical Provider, MD  denosumab (PROLIA) 60 MG/ML SOLN injection Inject 60 mg into the skin every 6 (six) months. Administer in upper arm, thigh, or abdomen   Yes Historical Provider, MD  Dextromethorphan-Guaifenesin (MUCINEX DM) 30-600 MG TB12 Take 2 tablets by mouth 2 (two) times daily as needed.   Yes Historical Provider, MD  gabapentin (NEURONTIN) 300 MG capsule Take 300 mg by mouth 3 (three) times daily.   Yes Historical Provider, MD  ibuprofen (ADVIL,MOTRIN) 200 MG tablet Take 600 mg by mouth every 8 (eight) hours as needed.   Yes Historical Provider, MD  levofloxacin (LEVAQUIN) 750 MG tablet Take 750 mg by mouth daily.   Yes Historical Provider, MD  levothyroxine (SYNTHROID, LEVOTHROID) 25 MCG tablet Take 50 mcg by mouth daily before breakfast.    Yes Historical Provider, MD  LORazepam (ATIVAN) 0.5 MG tablet Take 0.5 mg by mouth daily.   Yes Historical Provider, MD  morphine (MS CONTIN) 30 MG 12 hr tablet Take 30 mg by mouth every 12 (twelve) hours.   Yes Historical Provider, MD  OLANZapine (ZYPREXA) 15 MG tablet Take 15 mg by mouth at bedtime.   Yes Historical Provider, MD  omeprazole (PRILOSEC) 20 MG capsule Take 20 mg by mouth daily.   Yes Historical Provider, MD  oseltamivir (TAMIFLU) 75 MG capsule Take 75 mg by mouth daily. 05/15/16 05/20/16 Yes Historical Provider, MD  oxybutynin (DITROPAN-XL) 5 MG 24 hr tablet Take 5 mg by mouth at bedtime.   Yes Historical Provider, MD  Oxycodone HCl 10 MG TABS Take 10 mg by mouth every 6 (six) hours as needed  (moderate to severe pain).   Yes Historical Provider, MD  senna-docusate (SENNA PLUS) 8.6-50 MG per tablet Take 2 tablets by mouth 2 (two) times daily.   Yes Historical Provider, MD  acetaminophen (TYLENOL) 500 MG tablet Take 500 mg by mouth every 6 (six) hours as needed for mild pain.    Historical Provider, MD  alum & mag hydroxide-simeth (MAALOX/MYLANTA) 200-200-20 MG/5ML suspension Take by mouth every 6 (six) hours as needed for indigestion or heartburn.    Historical Provider, MD  dicyclomine (BENTYL) 10 MG capsule Take 1 capsule (10 mg total) by mouth 4 (four) times daily -  before meals and at bedtime. 05/04/11   Vickki Hearing, MD  loperamide (IMODIUM) 2 MG capsule Take 2-4 mg by mouth 4 (four) times daily as needed. For diarrhea, take 2 tablets (4mg ) initially then take 2 mg after each loose stool    Historical Provider, MD  LORazepam (ATIVAN) 1 MG tablet Take 1 mg by mouth 2 (two) times daily.  Historical Provider, MD  mirtazapine (REMERON) 45 MG tablet Take 45 mg by mouth every evening.    Historical Provider, MD  Nutritional Supplements (RESOURCE 2.0) LIQD Take 120 mLs by mouth 2 (two) times daily.     Historical Provider, MD  Nystatin (NYAMYC) 100000 UNIT/GM POWD Apply 1 application topically 2 (two) times daily. Groin area for 5 days(started on 11/18/13 (evening) 11/18/13   Historical Provider, MD  polyvinyl alcohol (LIQUIFILM TEARS) 1.4 % ophthalmic solution Place 1 drop into both eyes as needed for dry eyes.    Historical Provider, MD  simvastatin (ZOCOR) 20 MG tablet Take 20 mg by mouth at bedtime.     Historical Provider, MD    Physical Exam: Vitals:   05/16/16 0135 05/16/16 0230 05/16/16 0300  BP: 118/73 102/57 98/56  Pulse: 104 95 88  Resp: 20 19 22   Temp: (!) 103.1 F (39.5 C)    TempSrc: Rectal    SpO2: (!) 89% 97% 99%  Weight: 59 kg (130 lb)    Height: 5\' 3"  (1.6 m)        Constitutional: Obtunded, cachectic, pale Eyes: PERTLA, lids and conjunctivae  normal ENMT: Mucous membranes are dry. Posterior pharynx clear of any exudate or lesions.   Neck: normal, supple, no masses, no thyromegaly Respiratory: Coarse rhonchi throughout bilateral lung fields. Accessory muscle recruitment. No cyanosis.  Cardiovascular: S1 & S2 heard, regular rate and rhythm. No extremity edema. No significant JVD. Abdomen: No distension, no tenderness, no masses palpated. Bowel sounds normal.  Musculoskeletal: no clubbing / cyanosis. No joint deformity upper and lower extremities.   Skin: no significant rashes, lesions, ulcers. Poor turgor. Neurologic: No gross facial asymmetry. PERRL. Patellar DTR's normal.  Psychiatric: Obtunded, difficult to assess.      Labs on Admission: I have personally reviewed following labs and imaging studies  CBC:  Recent Labs Lab 05/16/16 0200  WBC 4.4  NEUTROABS 3.3  HGB 11.4*  HCT 35.7*  MCV 86.0  PLT 202   Basic Metabolic Panel:  Recent Labs Lab 05/16/16 0200  NA 139  K 3.6  CL 103  CO2 27  GLUCOSE 120*  BUN 21*  CREATININE 0.70  CALCIUM 8.8*   GFR: Estimated Creatinine Clearance: 53.4 mL/min (by C-G formula based on SCr of 0.7 mg/dL). Liver Function Tests:  Recent Labs Lab 05/16/16 0200  AST 42*  ALT 21  ALKPHOS 83  BILITOT 0.5  PROT 6.9  ALBUMIN 3.0*   No results for input(s): LIPASE, AMYLASE in the last 168 hours. No results for input(s): AMMONIA in the last 168 hours. Coagulation Profile: No results for input(s): INR, PROTIME in the last 168 hours. Cardiac Enzymes: No results for input(s): CKTOTAL, CKMB, CKMBINDEX, TROPONINI in the last 168 hours. BNP (last 3 results) No results for input(s): PROBNP in the last 8760 hours. HbA1C: No results for input(s): HGBA1C in the last 72 hours. CBG: No results for input(s): GLUCAP in the last 168 hours. Lipid Profile: No results for input(s): CHOL, HDL, LDLCALC, TRIG, CHOLHDL, LDLDIRECT in the last 72 hours. Thyroid Function Tests: No results for  input(s): TSH, T4TOTAL, FREET4, T3FREE, THYROIDAB in the last 72 hours. Anemia Panel: No results for input(s): VITAMINB12, FOLATE, FERRITIN, TIBC, IRON, RETICCTPCT in the last 72 hours. Urine analysis:    Component Value Date/Time   COLORURINE AMBER (A) 05/16/2016 0201   APPEARANCEUR HAZY (A) 05/16/2016 0201   LABSPEC 1.021 05/16/2016 0201   PHURINE 5.0 05/16/2016 0201   GLUCOSEU NEGATIVE 05/16/2016 0201  HGBUR NEGATIVE 05/16/2016 0201   BILIRUBINUR NEGATIVE 05/16/2016 0201   KETONESUR NEGATIVE 05/16/2016 0201   PROTEINUR 30 (A) 05/16/2016 0201   UROBILINOGEN 0.2 03/28/2013 1435   NITRITE NEGATIVE 05/16/2016 0201   LEUKOCYTESUR NEGATIVE 05/16/2016 0201   Sepsis Labs: @LABRCNTIP (procalcitonin:4,lacticidven:4) ) Recent Results (from the past 240 hour(s))  Blood Culture (routine x 2)     Status: None (Preliminary result)   Collection Time: 05/16/16  2:18 AM  Result Value Ref Range Status   Specimen Description BLOOD LEFT ARM  Final   Special Requests BOTTLES DRAWN AEROBIC AND ANAEROBIC 10CC  Final   Culture PENDING  Incomplete   Report Status PENDING  Incomplete  Blood Culture (routine x 2)     Status: None (Preliminary result)   Collection Time: 05/16/16  2:22 AM  Result Value Ref Range Status   Specimen Description BLOOD LEFT HAND  Final   Special Requests BOTTLES DRAWN AEROBIC AND ANAEROBIC [facility] [place]  Final   Culture PENDING  Incomplete   Report Status PENDING  Incomplete     Radiological Exams on Admission: Dg Chest Port 1 View  Result Date: 05/16/2016 CLINICAL DATA:  71 year old female with fever. EXAM: PORTABLE CHEST 1 VIEW COMPARISON:  Chest radiograph dated 03/28/2013 FINDINGS: Patchy areas of airspace density in the right mid and lower lung field as well as at the left lung base noted. Mild interstitial prominence may represent mild edema. There is no pleural effusion or pneumothorax. Stable mild cardiomegaly. No acute osseous pathology. IMPRESSION: Bilateral hazy airspace  densities concerning for infiltrate. Clinical correlation and follow-up recommended. Electronically Signed   By: Elgie Collard M.D.   On: 05/16/2016 02:32    EKG: Independently reviewed. Sinus tachycardia (rate 101), PVC, early R-transition  Assessment/Plan  1. HCAP, influenza, acute hypoxic respiratory failure  - Presents from nursing home with persistent fevers, confusion, and hypoxia after being diagnosed with influenza and PNA the day prior and started on Tamiflu and Levaquin - She is febrile to 39.5 C, hypoxic, obtunded, and with labored respirations and soft BP on presentation - CXR concerning for bilateral PNA; lactic acid reassuring at 1.47 - Blood cultures have been obtained, 2 liters NS given, supplemental oxygen provided, and empiric abx initiated with vancomycin and cefepime  - Plan to continue vancomycin and cefepime, with Tamiflu, while following cultures and clinical response to therapy - Continue supportive care with supplemental O2, IVF hydration, APAP   2. Acute encephalopathy  - Per outpatient notes, has severe memory deficits at baseline  - Acute confusional state likely secondary to acute infection with high fever and hypoxia  - No focal neurologic deficit identified  - Treat the underling infection process as above   3. Anemia of chronic disease   - Hgb is 11.4 on admission, better than priors, likely reflecting hemoconcentration - No apparent bleeding, will be monitored   4. Chronic pain - Pt without pain complaints on admission - Plan to continue home regimen of long-acting morphine with prn oxycodone once the acute confusional state is improving    5. Depression, psychosis - Difficult to assess on admission given her clinical condition - Resume Wellbutrin, Remeron, and Zyprexa once more alert  6. Hypothyroidism - Continue Synthroid  - Does not appear to be acute issue   DVT prophylaxis: sq Lovenox Code Status: Full  Family Communication: Discussed  with patient Disposition Plan: Admit to telemetry Consults called: None Admission status: Inpatient    Briscoe Deutscher, MD [facility] Pager [phone]  If  7PM-7AM, please contact night-coverage [email or web address] Password [email or web address]  05/16/2016, 3:49 AM

## 2016-05-16 NOTE — ED Triage Notes (Signed)
Pt brought over by [facility] from [facility]; pt tested positive for the flu b and pneumonia yesterday; staff from [facility] state pt has had some altered loc this evening

## 2016-05-16 NOTE — Clinical Social Work Note (Signed)
Clinical Social Work Assessment  Patient Details  Name: [name] MRN: [id number] Date of Birth: 19-Mar-1946  Date of referral:  05/16/16               Reason for consult:  Abuse/Neglect, Discharge Planning                Permission sought to share information with:  Guardian, Facility Medical sales representativeContact Representative Permission granted to share information::     Name::     [name]  Agency::  [facility]  Relationship::  guardian  Contact Information:  [phone]  Housing/Transportation Living arrangements for the past 2 months:  Skilled Nursing Facility Source of Information:  Guardian, Facility Patient Interpreter Needed:  None Criminal Activity/Legal Involvement Pertinent to Current Situation/Hospitalization:  No - Comment as needed Significant Relationships:    Lives with:  Facility Resident Do you feel safe going back to the place where you live?  Yes Need for family participation in patient care:  No (Coment)  Care giving concerns:  None reported. Pt is long term resident at [facility].    Social Worker assessment / plan:  CSW [facility] pt's [name]). [name] reports he has been guardian since last year. Pt has been a resident at [facility] for about 5 years and is nursing level of care. Debbie at [facility] indicates pt is in restorative therapy and ambulates with walker and assist. She is okay to return. [name] indicates he is aware pt is flu positive and has pneumonia. He states pt was more lethargic yesterday and came to ED for evaluation.   Employment status:  Retired Database administratornsurance information:  Managed Medicare PT Recommendations:  Not assessed at this time Information / Referral to community resources:  Other (Comment Required) (Return to [facility])  Patient/Family's Response to care:  [name] agreeable to return to [facility] when medically stable.   Patient/Family's Understanding of and Emotional Response to Diagnosis, Current Treatment, and Prognosis:   [name] spoke with RN already and is aware of admission diagnosis and treatment plan.   Emotional Assessment Appearance:  Appears stated age Attitude/Demeanor/Rapport:  Unable to Assess Affect (typically observed):  Unable to Assess Orientation:  Oriented to Self Alcohol / Substance use:  Not Applicable Psych involvement (Current and /or in the community):  No (Comment)  Discharge Needs  Concerns to be addressed:  Discharge Planning Concerns Readmission within the last 30 days:  No Current discharge risk:  None Barriers to Discharge:  Continued Medical Work up   Karn CassisStultz, Jenea Dake [place], LCSW 05/16/2016, 11:42 AM [phone]

## 2016-05-16 NOTE — Progress Notes (Signed)
Initial Nutrition Assessment  DOCUMENTATION CODES:   Not applicable  INTERVENTION:  - Continue Boost Breeze BID, each supplement provides 250 calories and 9 grams protein - Pro-Stat BID, each supplement provides 100 calories and 15 grams protein  NUTRITION DIAGNOSIS:   Increased nutrient needs related to acute illness as evidenced by estimated needs.  GOAL:   Patient will meet greater than or equal to 90% of their needs  MONITOR:   PO intake, Weight trends, I & O's, Supplement acceptance  REASON FOR ASSESSMENT:   Low Braden    ASSESSMENT:   71 y.o. female with PMH for depression with psychosis, memory deficits, chronic pain on opiates, hypothyroidism, and GERD who presents from her nursing home for evaluation of high fevers and altered mental status after being diagnosed with influenza and bacterial pneumonia.    Pt confused and visit only consisted of incomprehensible speech, no family at bedside, so unable to collect full history.   Per RN report, pt has not consumed any meals. A boost breeze and applesauce were by bedside with 1 or 2 bites consumed, RN states was only consumed to get medication down. No meal consumption percentage recorded.  Per chart review pt's weight has been fairly stable and shows weight gain since October. No significant weight changes for time frame.   Per nutrition focused physical exam pt showed mild-moderate muscle depletion, no fat depletion and no edema. Depletions likely associated with age-related sarcopenia not malnutrition due to weight status.  Labs reviewed; Serum glucose (120) Medications reviewed; 45 mg Remeron, 40 mg Protonix, 2 tablet Senokot-S BID  Diet Order:  Diet regular Room service appropriate? Yes; Fluid consistency: Thin  Skin:  Wound (see comment) (deep tissue injury at mid sacrum)  Last BM:  2/21  Height:   Ht Readings from Last 1 Encounters:  05/16/16 5\' 7"  (1.702 m)    Weight:   Wt Readings from Last 1  Encounters:  05/16/16 126 lb (57.2 kg)    Ideal Body Weight:  61.4 kg  BMI:  Body mass index is 19.73 kg/m.  Estimated Nutritional Needs:   Kcal:  1600-1800 (28-31 kcal/kg)  Protein:  75-85 (1.3-1.5 g/kg)  Fluid:  >/= 1.6 L/d  EDUCATION NEEDS:   Education needs no appropriate at this time  [employer] Ioannides Dietetic Intern

## 2016-05-16 NOTE — Care Management Note (Signed)
Case Management Note  Patient Details  Name: [name] MRN: [id number] Date of Birth: [birth year]  Subjective/Objective:                  Pt from [facility]. Pt ready for DC and plan for pt to return to SNF. CSW is consulted and is working with pt/family on return to facility.   Action/Plan: Anticipate DC back to facility. No CM needs.   Expected Discharge Date:       05/20/2016           Expected Discharge Plan:  Skilled Nursing Facility  In-House Referral:  Clinical Social Work  Discharge planning Services  CM Consult  Post Acute Care Choice:  NA Choice offered to:  NA  Status of Service:  Completed, signed off  Malcolm MetroChildress, Janesha Brissette Demske, RN 05/16/2016, 11:24 AM

## 2016-05-16 NOTE — Progress Notes (Signed)
This is an assumption of care note. She was admitted from a nursing home early this morning with healthcare associated pneumonia and influenza.  She is awake and will answer questions. She does appear encephalopathic. Her chest shows rhonchi and wheezes bilaterally. Her heart is regular.  She should be on treatment dose of Tamiflu. She should continue with her current antibiotics. Add moderate dose Solu-Medrol

## 2016-05-17 NOTE — Progress Notes (Signed)
Subjective: Patient was admitted from [place] due to Pneumonia/Influenza. Patient is on combination of IV antibiotics and tamiflu. Patient feels much better. Her breathing and her mental status is improving. Her fever is subsiding.  Objective: Vital signs in last 24 hours: Temp:  [97.7 F (36.5 C)-99.1 F (37.3 C)] 97.7 F (36.5 C) (02/22 0620) Pulse Rate:  [83-93] 83 (02/22 0620) Resp:  [18] 18 (02/22 0620) BP: (116-132)/(59-68) 116/68 (02/22 0620) SpO2:  [94 %-99 %] 99 % (02/22 0620) Weight change:  Last BM Date: 05/16/16  Intake/Output from previous day: 02/21 0701 - 02/22 0700 In: 300 [P.O.:150; IV Piggyback:150] Out: 1 [Urine:1]  PHYSICAL EXAM General appearance: alert and no distress Resp: diminished breath sounds bilaterally and rhonchi bilaterally Cardio: S1, S2 normal GI: soft, non-tender; bowel sounds normal; no masses,  no organomegaly Extremities: extremities normal, atraumatic, no cyanosis or edema  Lab Results:  Results for orders placed or performed during the hospital encounter of 05/16/16 (from the past 48 hour(s))  Comprehensive metabolic panel     Status: Abnormal   Collection Time: 05/16/16  2:00 AM  Result Value Ref Range   Sodium 139 135 - 145 mmol/L   Potassium 3.6 3.5 - 5.1 mmol/L   Chloride 103 101 - 111 mmol/L   CO2 27 22 - 32 mmol/L   Glucose, Bld 120 (H) 65 - 99 mg/dL   BUN 21 (H) 6 - 20 mg/dL   Creatinine, Ser 0.70 0.44 - 1.00 mg/dL   Calcium 8.8 (L) 8.9 - 10.3 mg/dL   Total Protein 6.9 6.5 - 8.1 g/dL   Albumin 3.0 (L) 3.5 - 5.0 g/dL   AST 42 (H) 15 - 41 U/L   ALT 21 14 - 54 U/L   Alkaline Phosphatase 83 38 - 126 U/L   Total Bilirubin 0.5 0.3 - 1.2 mg/dL   GFR calc non Af Amer >60 >60 mL/min   GFR calc Af Amer >60 >60 mL/min    Comment: (NOTE) The eGFR has been calculated using the CKD EPI equation. This calculation has not been validated in all clinical situations. eGFR's persistently <60 mL/min signify possible Chronic  Kidney Disease.    Anion gap 9 5 - 15  CBC WITH DIFFERENTIAL     Status: Abnormal   Collection Time: 05/16/16  2:00 AM  Result Value Ref Range   WBC 4.4 4.0 - 10.5 K/uL   RBC 4.15 3.87 - 5.11 MIL/uL   Hemoglobin 11.4 (L) 12.0 - 15.0 g/dL   HCT 35.7 (L) 36.0 - 46.0 %   MCV 86.0 78.0 - 100.0 fL   MCH 27.5 26.0 - 34.0 pg   MCHC 31.9 30.0 - 36.0 g/dL   RDW 14.9 11.5 - 15.5 %   Platelets 202 150 - 400 K/uL   Neutrophils Relative % 76 %   Neutro Abs 3.3 1.7 - 7.7 K/uL   Lymphocytes Relative 13 %   Lymphs Abs 0.6 (L) 0.7 - 4.0 K/uL   Monocytes Relative 11 %   Monocytes Absolute 0.5 0.1 - 1.0 K/uL   Eosinophils Relative 0 %   Eosinophils Absolute 0.0 0.0 - 0.7 K/uL   Basophils Relative 0 %   Basophils Absolute 0.0 0.0 - 0.1 K/uL  Urinalysis, Routine w reflex microscopic     Status: Abnormal   Collection Time: 05/16/16  2:01 AM  Result Value Ref Range   Color, Urine AMBER (A) YELLOW    Comment: BIOCHEMICALS MAY BE AFFECTED BY COLOR   APPearance HAZY (A)  CLEAR   Specific Gravity, Urine 1.021 1.005 - 1.030   pH 5.0 5.0 - 8.0   Glucose, UA NEGATIVE NEGATIVE mg/dL   Hgb urine dipstick NEGATIVE NEGATIVE   Bilirubin Urine NEGATIVE NEGATIVE   Ketones, ur NEGATIVE NEGATIVE mg/dL   Protein, ur 30 (A) NEGATIVE mg/dL   Nitrite NEGATIVE NEGATIVE   Leukocytes, UA NEGATIVE NEGATIVE   RBC / HPF 0-5 0 - 5 RBC/hpf   WBC, UA 0-5 0 - 5 WBC/hpf   Bacteria, UA NONE SEEN NONE SEEN   Mucous PRESENT    Hyaline Casts, UA PRESENT   I-Stat CG4 Lactic Acid, ED  (not at  [facility])     Status: None   Collection Time: 05/16/16  2:12 AM  Result Value Ref Range   Lactic Acid, Venous 1.45 0.5 - 1.9 mmol/L  Blood Culture (routine x 2)     Status: None (Preliminary result)   Collection Time: 05/16/16  2:18 AM  Result Value Ref Range   Specimen Description BLOOD LEFT ARM    Special Requests BOTTLES DRAWN AEROBIC AND ANAEROBIC 10CC    Culture NO GROWTH < 12 HOURS    Report Status PENDING   Strep pneumoniae  urinary antigen     Status: None   Collection Time: 05/16/16  2:21 AM  Result Value Ref Range   Strep Pneumo Urinary Antigen NEGATIVE NEGATIVE    Comment:        Infection due to S. pneumoniae cannot be absolutely ruled out since the antigen present may be below the detection limit of the test. Performed at [facility], [address], [place], [place] [address]   Blood Culture (routine x 2)     Status: None (Preliminary result)   Collection Time: 05/16/16  2:22 AM  Result Value Ref Range   Specimen Description BLOOD LEFT HAND    Special Requests BOTTLES DRAWN AEROBIC AND ANAEROBIC [place]    Culture NO GROWTH < 12 HOURS    Report Status PENDING   MRSA PCR Screening     Status: None   Collection Time: 05/16/16  4:59 AM  Result Value Ref Range   MRSA by PCR NEGATIVE NEGATIVE    Comment:        The GeneXpert MRSA Assay (FDA approved for NASAL specimens only), is one component of a comprehensive MRSA colonization surveillance program. It is not intended to diagnose MRSA infection nor to guide or monitor treatment for MRSA infections.     ABGS No results for input(s): PHART, PO2ART, TCO2, HCO3 in the last 72 hours.  Invalid input(s): PCO2 CULTURES Recent Results (from the past 240 hour(s))  Blood Culture (routine x 2)     Status: None (Preliminary result)   Collection Time: 05/16/16  2:18 AM  Result Value Ref Range Status   Specimen Description BLOOD LEFT ARM  Final   Special Requests BOTTLES DRAWN AEROBIC AND ANAEROBIC 10CC  Final   Culture NO GROWTH < 12 HOURS  Final   Report Status PENDING  Incomplete  Blood Culture (routine x 2)     Status: None (Preliminary result)   Collection Time: 05/16/16  2:22 AM  Result Value Ref Range Status   Specimen Description BLOOD LEFT HAND  Final   Special Requests BOTTLES DRAWN AEROBIC AND ANAEROBIC [place]  Final   Culture NO GROWTH < 12 HOURS  Final   Report Status PENDING  Incomplete  MRSA PCR Screening     Status: None    Collection Time:  05/16/16  4:59 AM  Result Value Ref Range Status   MRSA by PCR NEGATIVE NEGATIVE Final    Comment:        The GeneXpert MRSA Assay (FDA approved for NASAL specimens only), is one component of a comprehensive MRSA colonization surveillance program. It is not intended to diagnose MRSA infection nor to guide or monitor treatment for MRSA infections.    Studies/Results: Dg Chest Port 1 View  Result Date: 05/16/2016 CLINICAL DATA:  71 year old female with fever. EXAM: PORTABLE CHEST 1 VIEW COMPARISON:  Chest radiograph dated 03/28/2013 FINDINGS: Patchy areas of airspace density in the right mid and lower lung field as well as at the left lung base noted. Mild interstitial prominence may represent mild edema. There is no pleural effusion or pneumothorax. Stable mild cardiomegaly. No acute osseous pathology. IMPRESSION: Bilateral hazy airspace densities concerning for infiltrate. Clinical correlation and follow-up recommended. Electronically Signed   By: Anner Crete M.D.   On: 05/16/2016 02:32    Medications: I have reviewed the patient's current medications.  Assesment:  Principal Problem:   Healthcare-associated pneumonia Active Problems:   Psychosis   Depression   Chronic pain   Anemia, chronic disease   Acute respiratory failure with hypoxia (HCC)   Acute encephalopathy   Hypothyroidism   Influenza   Memory deficits    Plan:  Medications reviewed Will continue IV antibiotics and Tamiflu Continue regular treatment Supportive care    LOS: 1 day   Nyilah Kight 05/17/2016, 7:58 AM

## 2016-05-17 NOTE — NC FL2 (Signed)
[place] MEDICAID FL2 LEVEL OF CARE SCREENING TOOL     IDENTIFICATION  Patient Name: [name] Birthdate: Apr 28, 1945 Sex: female Admission Date (Current Location): 05/16/2016  [place]  [place] [id number] [facility] Facility and Address:  [facility],  [address], [place] [id number]      Provider Number: [id number]  Attending Physician Name and Address:  Avon Gullyesfaye Fanta, MD  Relative Name and Phone Number:       Current Level of Care: Hospital Recommended Level of Care: Skilled Nursing Facility Prior Approval Number:    Date Approved/Denied:   PASRR Number: [id number]  Discharge Plan: SNF    Current Diagnoses: Patient Active Problem List   Diagnosis Date Noted  . Healthcare-associated pneumonia 05/16/2016  . Acute respiratory failure with hypoxia (HCC) 05/16/2016  . Acute encephalopathy 05/16/2016  . Hypothyroidism 05/16/2016  . Influenza 05/16/2016  . Memory deficits 05/16/2016  . 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 Mechanical complication of internal orthopedic implant (HCC) 02/28/2012  . Cellulitis of right hip 01/13/2012  . Psychosis 01/13/2012  . Depression 01/13/2012  . Gout 01/13/2012  . Chronic pain 01/13/2012  . Decubitus ulcer of left buttock, stage 4 (HCC) 01/13/2012  . IBS (irritable bowel syndrome) 01/13/2012  . Hypokalemia 01/13/2012  . Anemia, chronic disease 01/13/2012  . Herniated disc 12/05/2011  . Seroma complicating a procedure 06/05/2011  . 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 Wound infection after surgery 05/22/2011  . Fall 04/30/2011  . Hip fracture, right (HCC) 04/30/2011  . CLOSED FRACTURE OF HEAD OF RADIUS 01/09/2010  . SUBLUXATION-RADIAL HEAD 01/09/2010    Orientation RESPIRATION BLADDER Height & Weight     Self  O2 (2 L) Incontinent Weight: 126 lb (57.2 kg) Height:  5\' 7"  (170.2 cm)  BEHAVIORAL SYMPTOMS/MOOD NEUROLOGICAL BOWEL NUTRITION  STATUS  Other (Comment) (none)  (n/a) Incontinent Diet (Regular. See d/c summary for updates.)  AMBULATORY STATUS COMMUNICATION OF NEEDS Skin   Extensive Assist Verbally Other (Comment) (Deep tissue injury, redness to mid sacrum with foam dressing)                       Personal Care Assistance Level of Assistance  Bathing, Feeding Bathing Assistance: Maximum assistance Feeding assistance: Maximum assistance Dressing Assistance: Maximum assistance     Functional Limitations Info  Sight, Hearing, Speech Sight Info: Adequate Hearing Info: Adequate Speech Info: Adequate    SPECIAL CARE FACTORS FREQUENCY                       Contractures      Additional Factors Info  Code Status, Allergies, Isolation Precautions Code Status Info: Full code Allergies Info: Diphenhydramine, Tape     Isolation Precautions Info: Droplet precautions     Current Medications (05/17/2016):  This is the current hospital active medication list Current Facility-Administered Medications  Medication Dose Route Frequency Provider Last Rate Last Dose  . acetaminophen (TYLENOL) tablet 650 mg  650 mg Oral Q6H PRN Briscoe Deutscherimothy S Opyd, MD   650 mg at 05/16/16 2257  . acidophilus (RISAQUAD) capsule 1 capsule  1 capsule Oral Daily Briscoe Deutscherimothy S Opyd, MD   1 capsule at 05/17/16 0913  . alum & mag hydroxide-simeth (MAALOX/MYLANTA) 200-200-20 MG/5ML suspension 15 mL  15 mL Oral Q6H PRN Lavone Neriimothy S Opyd, MD      . atorvastatin (LIPITOR) tablet 10 mg  10 mg Oral Daily Briscoe Deutscherimothy S Opyd, MD  10 mg at 05/17/16 0914  . buPROPion (WELLBUTRIN XL) 24 hr tablet 300 mg  300 mg Oral Daily Briscoe Deutscher, MD   300 mg at 05/17/16 0915  . ceFEPIme (MAXIPIME) 2 g in dextrose 5 % 50 mL IVPB  2 g Intravenous Q12H Avon Gully, MD   2 g at 05/17/16 0913  . chlorhexidine (PERIDEX) 0.12 % solution 15 mL  15 mL Mouth/Throat BID Briscoe Deutscher, MD   15 mL at 05/17/16 0915  . dextromethorphan-guaiFENesin (MUCINEX DM) 30-600 MG per 12 hr  tablet 2 tablet  2 tablet Oral BID PRN Briscoe Deutscher, MD      . dicyclomine (BENTYL) capsule 10 mg  10 mg Oral TID AC & HS Briscoe Deutscher, MD   10 mg at 05/17/16 0914  . enoxaparin (LOVENOX) injection 40 mg  40 mg Subcutaneous Q24H Briscoe Deutscher, MD   40 mg at 05/17/16 0913  . feeding supplement (BOOST / RESOURCE BREEZE) liquid 1 Container  1 Container Oral BID Briscoe Deutscher, MD   1 Container at 05/17/16 1000  . feeding supplement (PRO-STAT SUGAR FREE 64) liquid 30 mL  30 mL Oral BID Avon Gully, MD   30 mL at 05/17/16 0913  . gabapentin (NEURONTIN) capsule 300 mg  300 mg Oral TID Briscoe Deutscher, MD   300 mg at 05/17/16 0914  . levothyroxine (SYNTHROID, LEVOTHROID) tablet 50 mcg  50 mcg Oral QAC breakfast Briscoe Deutscher, MD   50 mcg at 05/17/16 0915  . LORazepam (ATIVAN) tablet 1 mg  1 mg Oral BID Briscoe Deutscher, MD   1 mg at 05/17/16 0915  . methylPREDNISolone sodium succinate (SOLU-MEDROL) 40 mg/mL injection 40 mg  40 mg Intravenous Q12H Kari Baars, MD   40 mg at 05/17/16 0915  . mirtazapine (REMERON) tablet 45 mg  45 mg Oral QPM Briscoe Deutscher, MD   45 mg at 05/16/16 1652  . morphine (MS CONTIN) 12 hr tablet 30 mg  30 mg Oral Q12H Briscoe Deutscher, MD   30 mg at 05/17/16 0914  . OLANZapine (ZYPREXA) tablet 15 mg  15 mg Oral QHS Briscoe Deutscher, MD   15 mg at 05/16/16 2256  . oseltamivir (TAMIFLU) capsule 75 mg  75 mg Oral BID Briscoe Deutscher, MD   75 mg at 05/17/16 0915  . oxybutynin (DITROPAN-XL) 24 hr tablet 5 mg  5 mg Oral QHS Briscoe Deutscher, MD   5 mg at 05/16/16 2258  . oxyCODONE (Oxy IR/ROXICODONE) immediate release tablet 10 mg  10 mg Oral Q6H PRN Briscoe Deutscher, MD   10 mg at 05/17/16 0914  . pantoprazole (PROTONIX) EC tablet 40 mg  40 mg Oral Daily Briscoe Deutscher, MD   40 mg at 05/17/16 0914  . polyvinyl alcohol (LIQUIFILM TEARS) 1.4 % ophthalmic solution 1 drop  1 drop Both Eyes PRN Briscoe Deutscher, MD      . senna-docusate (Senokot-S) tablet 2 tablet  2 tablet Oral BID Briscoe Deutscher, MD   2 tablet at 05/17/16 0914  . vancomycin (VANCOCIN) 500 mg in sodium chloride 0.9 % 100 mL IVPB  500 mg Intravenous Q12H Avon Gully, MD   500 mg at 05/17/16 0234     Discharge Medications: Please see discharge summary for a list of discharge medications.  Relevant Imaging Results:  Relevant Lab Results:   Additional Information Legal guardian Zebedee Iba.   Derenda Fennel [place], [place] [phone]

## 2016-05-17 NOTE — Consult Note (Signed)
Consult requested by: Dr. Felecia Shelling Consult requested for healthcare associated pneumonia/acute on chronic hypoxic respiratory failure:  HPI: This is a 71 year old resident of the skilled care facility who came to the hospital because of pneumonia. She had been diagnosed with influenza at her skilled care facility but continued to have fever and respiratory problems was brought to the emergency department and was found to have pneumonia. She is being treated for healthcare associated pneumonia. She has significant trouble with anxiety and depression and although she does answer questions is mostly nonsensical. No other new complaints but I'm not sure how accurate that is.  Past Medical History:  Diagnosis Date  . Anemia   . Anemia   . Anxiety and depression   . Bacteremia   . Cellulitis   . Chronic back pain   . Chronic diarrhea   . Chronic right hip pain   . Depression   . GERD (gastroesophageal reflux disease)   . Gout   . Gout   . Hypopotassemia   . IBS (irritable bowel syndrome)   . Muscle weakness (generalized)   . Osteoporosis   . Pressure ulcer of buttock   . Psychosis      History reviewed. No pertinent family history.   Social History   Social History  . Marital status: Single    Spouse name: N/A  . Number of children: N/A  . Years of education: N/A   Social History Main Topics  . Smoking status: Former Games developer  . Smokeless tobacco: Never Used  . Alcohol use No  . Drug use: No  . Sexual activity: Not Asked   Other Topics Concern  . None   Social History Narrative  . None     ROS: Unobtainable in her present condition    Objective: Vital signs in last 24 hours: Temp:  [97.7 F (36.5 C)-99.1 F (37.3 C)] 97.7 F (36.5 C) (02/22 0620) Pulse Rate:  [83-93] 83 (02/22 0620) Resp:  [18] 18 (02/22 0620) BP: (116-132)/(59-68) 116/68 (02/22 0620) SpO2:  [94 %-99 %] 99 % (02/22 0620) Weight change:  Last BM Date: 05/16/16  Intake/Output from previous  day: 02/21 0701 - 02/22 0700 In: 300 [P.O.:150; IV Piggyback:150] Out: 1 [Urine:1]  PHYSICAL EXAM Constitutional: She is awake and alert and in no acute distress. Eyes: Pupils react. EOMI. Ears nose mouth and throat: Mucous membranes are moist. Throat is clear. Hearing is grossly normal. Cardiovascular: Her heart is regular with normal heart sounds. No edema. Respiratory: She has bilateral rhonchi. Gastrointestinal: Her abdomen is soft with no masses. Musculoskeletal: Normal muscle strength. Neurological: No focal findings. Psychiatric: She is talking but not really answering questions. She is much more alert than when I saw her for Dr. Felecia Shelling yesterday  Lab Results: Basic Metabolic Panel:  Recent Labs  36/64/40 0200  NA 139  K 3.6  CL 103  CO2 27  GLUCOSE 120*  BUN 21*  CREATININE 0.70  CALCIUM 8.8*   Liver Function Tests:  Recent Labs  05/16/16 0200  AST 42*  ALT 21  ALKPHOS 83  BILITOT 0.5  PROT 6.9  ALBUMIN 3.0*   No results for input(s): LIPASE, AMYLASE in the last 72 hours. No results for input(s): AMMONIA in the last 72 hours. CBC:  Recent Labs  05/16/16 0200  WBC 4.4  NEUTROABS 3.3  HGB 11.4*  HCT 35.7*  MCV 86.0  PLT 202   Cardiac Enzymes: No results for input(s): CKTOTAL, CKMB, CKMBINDEX, TROPONINI in the last 72 hours.  BNP: No results for input(s): PROBNP in the last 72 hours. D-Dimer: No results for input(s): DDIMER in the last 72 hours. CBG: No results for input(s): GLUCAP in the last 72 hours. Hemoglobin A1C: No results for input(s): HGBA1C in the last 72 hours. Fasting Lipid Panel: No results for input(s): CHOL, HDL, LDLCALC, TRIG, CHOLHDL, LDLDIRECT in the last 72 hours. Thyroid Function Tests: No results for input(s): TSH, T4TOTAL, FREET4, T3FREE, THYROIDAB in the last 72 hours. Anemia Panel: No results for input(s): VITAMINB12, FOLATE, FERRITIN, TIBC, IRON, RETICCTPCT in the last 72 hours. Coagulation: No results for input(s):  LABPROT, INR in the last 72 hours. Urine Drug Screen: Drugs of Abuse  No results found for: LABOPIA, COCAINSCRNUR, LABBENZ, AMPHETMU, THCU, LABBARB  Alcohol Level: No results for input(s): ETH in the last 72 hours. Urinalysis:  Recent Labs  05/16/16 0201  COLORURINE AMBER*  LABSPEC 1.021  PHURINE 5.0  GLUCOSEU NEGATIVE  HGBUR NEGATIVE  BILIRUBINUR NEGATIVE  KETONESUR NEGATIVE  PROTEINUR 30*  NITRITE NEGATIVE  LEUKOCYTESUR NEGATIVE   Misc. Labs:   ABGS: No results for input(s): PHART, PO2ART, TCO2, HCO3 in the last 72 hours.  Invalid input(s): PCO2   MICROBIOLOGY: Recent Results (from the past 240 hour(s))  Blood Culture (routine x 2)     Status: None (Preliminary result)   Collection Time: 05/16/16  2:18 AM  Result Value Ref Range Status   Specimen Description BLOOD LEFT ARM  Final   Special Requests BOTTLES DRAWN AEROBIC AND ANAEROBIC 10CC  Final   Culture NO GROWTH < 12 HOURS  Final   Report Status PENDING  Incomplete  Blood Culture (routine x 2)     Status: None (Preliminary result)   Collection Time: 05/16/16  2:22 AM  Result Value Ref Range Status   Specimen Description BLOOD LEFT HAND  Final   Special Requests BOTTLES DRAWN AEROBIC AND ANAEROBIC 7CC  Final   Culture NO GROWTH < 12 HOURS  Final   Report Status PENDING  Incomplete  MRSA PCR Screening     Status: None   Collection Time: 05/16/16  4:59 AM  Result Value Ref Range Status   MRSA by PCR NEGATIVE NEGATIVE Final    Comment:        The GeneXpert MRSA Assay (FDA approved for NASAL specimens only), is one component of a comprehensive MRSA colonization surveillance program. It is not intended to diagnose MRSA infection nor to guide or monitor treatment for MRSA infections.     Studies/Results: Dg Chest Port 1 View  Result Date: 05/16/2016 CLINICAL DATA:  71 year old female with fever. EXAM: PORTABLE CHEST 1 VIEW COMPARISON:  Chest radiograph dated 03/28/2013 FINDINGS: Patchy areas of  airspace density in the right mid and lower lung field as well as at the left lung base noted. Mild interstitial prominence may represent mild edema. There is no pleural effusion or pneumothorax. Stable mild cardiomegaly. No acute osseous pathology. IMPRESSION: Bilateral hazy airspace densities concerning for infiltrate. Clinical correlation and follow-up recommended. Electronically Signed   By: Elgie CollardArash  Radparvar M.D.   On: 05/16/2016 02:32    Medications:  Prior to Admission:  Prescriptions Prior to Admission  Medication Sig Dispense Refill Last Dose  . acetaminophen (TYLENOL) 500 MG tablet Take 500 mg by mouth every 6 (six) hours as needed for mild pain.   Taking  . acidophilus (RISAQUAD) CAPS capsule Take 1 capsule by mouth daily.   Taking  . alum & mag hydroxide-simeth (MAALOX/MYLANTA) 200-200-20 MG/5ML suspension Take by mouth every  6 (six) hours as needed for indigestion or heartburn.   Taking  . atorvastatin (LIPITOR) 10 MG tablet Take 10 mg by mouth daily.   Taking  . buPROPion (WELLBUTRIN XL) 300 MG 24 hr tablet Take 300 mg by mouth daily.   Taking  . calcium & magnesium carbonates (MYLANTA) 161-096 MG tablet Take 1 tablet by mouth daily.     . calcium carbonate (CALCIUM 600) 1500 (600 Ca) MG TABS tablet Take by mouth 2 (two) times daily with a meal.   Taking  . chlorhexidine (PERIDEX) 0.12 % solution Use as directed 15 mLs in the mouth or throat 2 (two) times daily.     Marland Kitchen denosumab (PROLIA) 60 MG/ML SOLN injection Inject 60 mg into the skin every 6 (six) months. Administer in upper arm, thigh, or abdomen   Taking  . Dextromethorphan-Guaifenesin (MUCINEX DM) 30-600 MG TB12 Take 2 tablets by mouth 2 (two) times daily as needed.     . dicyclomine (BENTYL) 10 MG capsule Take 1 capsule (10 mg total) by mouth 4 (four) times daily -  before meals and at bedtime. 40 capsule 0 05/15/2016 at Unknown time  . gabapentin (NEURONTIN) 300 MG capsule Take 300 mg by mouth 3 (three) times daily.   Taking  .  ibuprofen (ADVIL,MOTRIN) 200 MG tablet Take 600 mg by mouth every 8 (eight) hours as needed.     Marland Kitchen levofloxacin (LEVAQUIN) 750 MG tablet Take 750 mg by mouth daily.     Marland Kitchen levothyroxine (SYNTHROID, LEVOTHROID) 25 MCG tablet Take 50 mcg by mouth daily before breakfast.    Taking  . loperamide (IMODIUM) 2 MG capsule Take 2-4 mg by mouth 4 (four) times daily as needed. For diarrhea, take 2 tablets (4mg ) initially then take 2 mg after each loose stool   Taking  . LORazepam (ATIVAN) 0.5 MG tablet Take 0.5 mg by mouth daily.   Taking  . morphine (MS CONTIN) 30 MG 12 hr tablet Take 30 mg by mouth every 12 (twelve) hours.   Taking  . OLANZapine (ZYPREXA) 15 MG tablet Take 15 mg by mouth at bedtime.   Taking  . omeprazole (PRILOSEC) 20 MG capsule Take 20 mg by mouth daily.   Taking  . oseltamivir (TAMIFLU) 75 MG capsule Take 75 mg by mouth daily.     Marland Kitchen oxybutynin (DITROPAN-XL) 5 MG 24 hr tablet Take 5 mg by mouth at bedtime.   Taking  . Oxycodone HCl 10 MG TABS Take 10 mg by mouth every 6 (six) hours as needed (moderate to severe pain).   Taking  . senna-docusate (SENNA PLUS) 8.6-50 MG per tablet Take 2 tablets by mouth 2 (two) times daily.   Taking  . LORazepam (ATIVAN) 1 MG tablet Take 1 mg by mouth 2 (two) times daily.   Taking  . mirtazapine (REMERON) 45 MG tablet Take 45 mg by mouth every evening.   Taking  . Nutritional Supplements (RESOURCE 2.0) LIQD Take 120 mLs by mouth 2 (two) times daily.    Taking  . Nystatin (NYAMYC) 100000 UNIT/GM POWD Apply 1 application topically 2 (two) times daily. Groin area for 5 days(started on 11/18/13 (evening)   Not Taking  . polyvinyl alcohol (LIQUIFILM TEARS) 1.4 % ophthalmic solution Place 1 drop into both eyes as needed for dry eyes.   Taking  . simvastatin (ZOCOR) 20 MG tablet Take 20 mg by mouth at bedtime.    Not Taking   Scheduled: . acidophilus  1 capsule Oral Daily  .  atorvastatin  10 mg Oral Daily  . buPROPion  300 mg Oral Daily  . ceFEPime (MAXIPIME)  IV  2 g Intravenous Q12H  . chlorhexidine  15 mL Mouth/Throat BID  . dicyclomine  10 mg Oral TID AC & HS  . enoxaparin (LOVENOX) injection  40 mg Subcutaneous Q24H  . feeding supplement  1 Container Oral BID  . feeding supplement (PRO-STAT SUGAR FREE 64)  30 mL Oral BID  . gabapentin  300 mg Oral TID  . levothyroxine  50 mcg Oral QAC breakfast  . LORazepam  1 mg Oral BID  . methylPREDNISolone (SOLU-MEDROL) injection  40 mg Intravenous Q12H  . mirtazapine  45 mg Oral QPM  . morphine  30 mg Oral Q12H  . OLANZapine  15 mg Oral QHS  . oseltamivir  75 mg Oral BID  . oxybutynin  5 mg Oral QHS  . pantoprazole  40 mg Oral Daily  . senna-docusate  2 tablet Oral BID  . vancomycin  500 mg Intravenous Q12H   Continuous:  ZOX:[id number], alum & mag hydroxide-simeth, dextromethorphan-guaiFENesin, oxyCODONE, polyvinyl alcohol  Assesment: She is on appropriate treatment. She has healthcare associated pneumonia. She has encephalopathy. She has influenza. She is being treated for all of the above and she has improved since I saw her for Dr. Felecia Shelling yesterday Principal Problem:   Healthcare-associated pneumonia Active Problems:   Psychosis   Depression   Chronic pain   Anemia, chronic disease   Acute respiratory failure with hypoxia (HCC)   Acute encephalopathy   Hypothyroidism   Influenza   Memory deficits    Plan: Continue current treatments    LOS: 1 day   Hayward Rylander L 05/17/2016, 8:56 AM

## 2016-05-18 NOTE — Clinical Social Work Note (Signed)
CSW updated [facility] on pt. Facility continues to be willing to accept pt when medically stable.   Derenda FennelKara Daire Okimoto, LCSW [phone]

## 2016-05-18 NOTE — Care Management Important Message (Signed)
Important Message  Patient Details  Name: [name] MRN: [id number] Date of Birth: 11-15-1945   Medicare Important Message Given:  Yes    Malcolm MetroChildress, Sadhana Frater Demske, RN 05/18/2016, 9:26 AM

## 2016-05-18 NOTE — Progress Notes (Signed)
Subjective: She is much more alert this morning. No new problems have been noted. She specifically denies chest pain nausea vomiting diarrhea  Objective: Vital signs in last 24 hours: Temp:  [98.5 F (36.9 C)-99 F (37.2 C)] 98.5 F (36.9 C) (02/23 0529) Pulse Rate:  [79-86] 86 (02/23 0529) Resp:  [18] 18 (02/23 0529) BP: (128-133)/(70-75) 128/75 (02/23 0529) SpO2:  [96 %-98 %] 96 % (02/23 0529) Weight change:  Last BM Date: 05/16/16  Intake/Output from previous day: 02/22 0701 - 02/23 0700 In: 290 [P.O.:290] Out: -   PHYSICAL EXAM General appearance: alert and no distress Resp: She still has bilateral rhonchi Cardio: regular rate and rhythm, S1, S2 normal, no murmur, click, rub or gallop GI: soft, non-tender; bowel sounds normal; no masses,  no organomegaly Extremities: extremities normal, atraumatic, no cyanosis or edema Skin warm and dry. Mucous membranes are moist  Lab Results:  No results found for this or any previous visit (from the past 48 hour(s)).  ABGS No results for input(s): PHART, PO2ART, TCO2, HCO3 in the last 72 hours.  Invalid input(s): PCO2 CULTURES Recent Results (from the past 240 hour(s))  Blood Culture (routine x 2)     Status: None (Preliminary result)   Collection Time: 05/16/16  2:18 AM  Result Value Ref Range Status   Specimen Description BLOOD LEFT ARM  Final   Special Requests BOTTLES DRAWN AEROBIC AND ANAEROBIC 10CC  Final   Culture NO GROWTH 1 DAY  Final   Report Status PENDING  Incomplete  Blood Culture (routine x 2)     Status: None (Preliminary result)   Collection Time: 05/16/16  2:22 AM  Result Value Ref Range Status   Specimen Description BLOOD LEFT HAND  Final   Special Requests BOTTLES DRAWN AEROBIC AND ANAEROBIC 7CC  Final   Culture NO GROWTH 1 DAY  Final   Report Status PENDING  Incomplete  MRSA PCR Screening     Status: None   Collection Time: 05/16/16  4:59 AM  Result Value Ref Range Status   MRSA by PCR NEGATIVE  NEGATIVE Final    Comment:        The GeneXpert MRSA Assay (FDA approved for NASAL specimens only), is one component of a comprehensive MRSA colonization surveillance program. It is not intended to diagnose MRSA infection nor to guide or monitor treatment for MRSA infections.    Studies/Results: No results found.  Medications:  Prior to Admission:  Prescriptions Prior to Admission  Medication Sig Dispense Refill Last Dose  . acetaminophen (TYLENOL) 500 MG tablet Take 500 mg by mouth every 6 (six) hours as needed for mild pain.   Taking  . acidophilus (RISAQUAD) CAPS capsule Take 1 capsule by mouth daily.   Taking  . alum & mag hydroxide-simeth (MAALOX/MYLANTA) 200-200-20 MG/5ML suspension Take by mouth every 6 (six) hours as needed for indigestion or heartburn.   Taking  . atorvastatin (LIPITOR) 10 MG tablet Take 10 mg by mouth daily.   Taking  . buPROPion (WELLBUTRIN XL) 300 MG 24 hr tablet Take 300 mg by mouth daily.   Taking  . calcium & magnesium carbonates (MYLANTA) 161-096311-232 MG tablet Take 1 tablet by mouth daily.     . calcium carbonate (CALCIUM 600) 1500 (600 Ca) MG TABS tablet Take by mouth 2 (two) times daily with a meal.   Taking  . chlorhexidine (PERIDEX) 0.12 % solution Use as directed 15 mLs in the mouth or throat 2 (two) times daily.     .Marland Kitchen  denosumab (PROLIA) 60 MG/ML SOLN injection Inject 60 mg into the skin every 6 (six) months. Administer in upper arm, thigh, or abdomen   Taking  . Dextromethorphan-Guaifenesin (MUCINEX DM) 30-600 MG TB12 Take 2 tablets by mouth 2 (two) times daily as needed.     . dicyclomine (BENTYL) 10 MG capsule Take 1 capsule (10 mg total) by mouth 4 (four) times daily -  before meals and at bedtime. 40 capsule 0 05/15/2016 at Unknown time  . gabapentin (NEURONTIN) 300 MG capsule Take 300 mg by mouth 3 (three) times daily.   Taking  . ibuprofen (ADVIL,MOTRIN) 200 MG tablet Take 600 mg by mouth every 8 (eight) hours as needed.     Marland Kitchen levofloxacin  (LEVAQUIN) 750 MG tablet Take 750 mg by mouth daily.     Marland Kitchen levothyroxine (SYNTHROID, LEVOTHROID) 25 MCG tablet Take 50 mcg by mouth daily before breakfast.    Taking  . loperamide (IMODIUM) 2 MG capsule Take 2-4 mg by mouth 4 (four) times daily as needed. For diarrhea, take 2 tablets (4mg ) initially then take 2 mg after each loose stool   Taking  . LORazepam (ATIVAN) 0.5 MG tablet Take 0.5 mg by mouth daily.   Taking  . morphine (MS CONTIN) 30 MG 12 hr tablet Take 30 mg by mouth every 12 (twelve) hours.   Taking  . OLANZapine (ZYPREXA) 15 MG tablet Take 15 mg by mouth at bedtime.   Taking  . omeprazole (PRILOSEC) 20 MG capsule Take 20 mg by mouth daily.   Taking  . oseltamivir (TAMIFLU) 75 MG capsule Take 75 mg by mouth daily.     Marland Kitchen oxybutynin (DITROPAN-XL) 5 MG 24 hr tablet Take 5 mg by mouth at bedtime.   Taking  . Oxycodone HCl 10 MG TABS Take 10 mg by mouth every 6 (six) hours as needed (moderate to severe pain).   Taking  . senna-docusate (SENNA PLUS) 8.6-50 MG per tablet Take 2 tablets by mouth 2 (two) times daily.   Taking  . LORazepam (ATIVAN) 1 MG tablet Take 1 mg by mouth 2 (two) times daily.   Taking  . mirtazapine (REMERON) 45 MG tablet Take 45 mg by mouth every evening.   Taking  . Nutritional Supplements (RESOURCE 2.0) LIQD Take 120 mLs by mouth 2 (two) times daily.    Taking  . Nystatin (NYAMYC) 100000 UNIT/GM POWD Apply 1 application topically 2 (two) times daily. Groin area for 5 days(started on 11/18/13 (evening)   Not Taking  . polyvinyl alcohol (LIQUIFILM TEARS) 1.4 % ophthalmic solution Place 1 drop into both eyes as needed for dry eyes.   Taking  . simvastatin (ZOCOR) 20 MG tablet Take 20 mg by mouth at bedtime.    Not Taking   Scheduled: . acidophilus  1 capsule Oral Daily  . atorvastatin  10 mg Oral Daily  . buPROPion  300 mg Oral Daily  . ceFEPime (MAXIPIME) IV  2 g Intravenous Q12H  . chlorhexidine  15 mL Mouth/Throat BID  . dicyclomine  10 mg Oral TID AC & HS  .  enoxaparin (LOVENOX) injection  40 mg Subcutaneous Q24H  . feeding supplement  1 Container Oral BID  . feeding supplement (PRO-STAT SUGAR FREE 64)  30 mL Oral BID  . gabapentin  300 mg Oral TID  . levothyroxine  50 mcg Oral QAC breakfast  . LORazepam  1 mg Oral BID  . methylPREDNISolone (SOLU-MEDROL) injection  40 mg Intravenous Q12H  . mirtazapine  45  mg Oral QPM  . morphine  30 mg Oral Q12H  . OLANZapine  15 mg Oral QHS  . oseltamivir  75 mg Oral BID  . oxybutynin  5 mg Oral QHS  . pantoprazole  40 mg Oral Daily  . senna-docusate  2 tablet Oral BID  . vancomycin  500 mg Intravenous Q12H   Continuous:  ZOX:[id number], alum & mag hydroxide-simeth, dextromethorphan-guaiFENesin, oxyCODONE, polyvinyl alcohol  Assesment: She has healthcare associated pneumonia. She has acute hypoxic respiratory failure. She has influenza as well. She seems to be improving. Principal Problem:   Healthcare-associated pneumonia Active Problems:   Psychosis   Depression   Chronic pain   Anemia, chronic disease   Acute respiratory failure with hypoxia (HCC)   Acute encephalopathy   Hypothyroidism   Influenza   Memory deficits    Plan: Continue current treatments    LOS: 2 days   [name] L 05/18/2016, 8:51 AM

## 2016-05-18 NOTE — Progress Notes (Signed)
Subjective: Patient is more alert and awake but remained very anxious. Her breating is improving. No fever or chills..  Objective: Vital signs in last 24 hours: Temp:  [98.5 F (36.9 C)-99 F (37.2 C)] 98.5 F (36.9 C) (02/23 0529) Pulse Rate:  [79-86] 86 (02/23 0529) Resp:  [18] 18 (02/23 0529) BP: (128-133)/(70-75) 128/75 (02/23 0529) SpO2:  [96 %-98 %] 96 % (02/23 0529) Weight change:  Last BM Date: 05/16/16  Intake/Output from previous day: 02/22 0701 - 02/23 0700 In: 170 [P.O.:170] Out: -   PHYSICAL EXAM General appearance: alert and no distress Resp: diminished breath sounds bilaterally and rhonchi bilaterally Cardio: S1, S2 normal GI: soft, non-tender; bowel sounds normal; no masses,  no organomegaly Extremities: extremities normal, atraumatic, no cyanosis or edema  Lab Results:  No results found for this or any previous visit (from the past 48 hour(s)).  ABGS No results for input(s): PHART, PO2ART, TCO2, HCO3 in the last 72 hours.  Invalid input(s): PCO2 CULTURES Recent Results (from the past 240 hour(s))  Blood Culture (routine x 2)     Status: None (Preliminary result)   Collection Time: 05/16/16  2:18 AM  Result Value Ref Range Status   Specimen Description BLOOD LEFT ARM  Final   Special Requests BOTTLES DRAWN AEROBIC AND ANAEROBIC 10CC  Final   Culture NO GROWTH 1 DAY  Final   Report Status PENDING  Incomplete  Blood Culture (routine x 2)     Status: None (Preliminary result)   Collection Time: 05/16/16  2:22 AM  Result Value Ref Range Status   Specimen Description BLOOD LEFT HAND  Final   Special Requests BOTTLES DRAWN AEROBIC AND ANAEROBIC 7CC  Final   Culture NO GROWTH 1 DAY  Final   Report Status PENDING  Incomplete  MRSA PCR Screening     Status: None   Collection Time: 05/16/16  4:59 AM  Result Value Ref Range Status   MRSA by PCR NEGATIVE NEGATIVE Final    Comment:        The GeneXpert MRSA Assay (FDA approved for NASAL specimens only),  is one component of a comprehensive MRSA colonization surveillance program. It is not intended to diagnose MRSA infection nor to guide or monitor treatment for MRSA infections.    Studies/Results: No results found.  Medications: I have reviewed the patient's current medications.  Assesment:  Principal Problem:   Healthcare-associated pneumonia Active Problems:   Psychosis   Depression   Chronic pain   Anemia, chronic disease   Acute respiratory failure with hypoxia (HCC)   Acute encephalopathy   Hypothyroidism   Influenza   Memory deficits    Plan:  Medications reviewed Will continue IV antibiotics and Tamiflu Pulmonary consult appreciated Continue regular treatment Supportive care    LOS: 2 days   Radford Pease 05/18/2016, 8:13 AM

## 2016-05-19 ENCOUNTER — Inpatient Hospital Stay (HOSPITAL_COMMUNITY): Payer: Medicare Other

## 2016-05-19 NOTE — Progress Notes (Signed)
Subjective: Patient is resting. She is receiving combination IV antibiotics. She is gradually improving. Objective: Vital signs in last 24 hours: Temp:  [98.4 F (36.9 C)-99.3 F (37.4 C)] 98.4 F (36.9 C) (02/24 0454) Pulse Rate:  [74-79] 79 (02/24 0454) Resp:  [18-20] 18 (02/24 0454) BP: (118-128)/(55-74) 119/68 (02/24 0454) SpO2:  [94 %-98 %] 94 % (02/24 0454) Weight change:  Last BM Date: 05/16/16  Intake/Output from previous day: 02/23 0701 - 02/24 0700 In: 240 [P.O.:240] Out: 25 [Urine:25]  PHYSICAL EXAM General appearance: alert and no distress Resp: diminished breath sounds bilaterally and rhonchi bilaterally Cardio: S1, S2 normal GI: soft, non-tender; bowel sounds normal; no masses,  no organomegaly Extremities: extremities normal, atraumatic, no cyanosis or edema  Lab Results:  No results found for this or any previous visit (from the past 48 hour(s)).  ABGS No results for input(s): PHART, PO2ART, TCO2, HCO3 in the last 72 hours.  Invalid input(s): PCO2 CULTURES Recent Results (from the past 240 hour(s))  Blood Culture (routine x 2)     Status: None (Preliminary result)   Collection Time: 05/16/16  2:18 AM  Result Value Ref Range Status   Specimen Description BLOOD LEFT ARM  Final   Special Requests BOTTLES DRAWN AEROBIC AND ANAEROBIC 10CC  Final   Culture NO GROWTH 2 DAYS  Final   Report Status PENDING  Incomplete  Blood Culture (routine x 2)     Status: None (Preliminary result)   Collection Time: 05/16/16  2:22 AM  Result Value Ref Range Status   Specimen Description BLOOD LEFT HAND  Final   Special Requests BOTTLES DRAWN AEROBIC AND ANAEROBIC 7CC  Final   Culture NO GROWTH 2 DAYS  Final   Report Status PENDING  Incomplete  MRSA PCR Screening     Status: None   Collection Time: 05/16/16  4:59 AM  Result Value Ref Range Status   MRSA by PCR NEGATIVE NEGATIVE Final    Comment:        The GeneXpert MRSA Assay (FDA approved for NASAL specimens only),  is one component of a comprehensive MRSA colonization surveillance program. It is not intended to diagnose MRSA infection nor to guide or monitor treatment for MRSA infections.    Studies/Results: No results found.  Medications: I have reviewed the patient's current medications.  Assesment:  Principal Problem:   Healthcare-associated pneumonia Active Problems:   Psychosis   Depression   Chronic pain   Anemia, chronic disease   Acute respiratory failure with hypoxia (HCC)   Acute encephalopathy   Hypothyroidism   Influenza   Memory deficits    Plan:  Medications reviewed Will continue IV antibiotics and Tamiflu Will do follow up chest x-ray Continue regular treatment Supportive care    LOS: 3 days   [name] 05/19/2016, 9:20 AM

## 2016-05-20 LAB — CBC
HEMATOCRIT: 31.7 % — AB (ref 36.0–46.0)
Hemoglobin: 10.3 g/dL — ABNORMAL LOW (ref 12.0–15.0)
MCH: 27.5 pg (ref 26.0–34.0)
MCHC: 32.5 g/dL (ref 30.0–36.0)
MCV: 84.8 fL (ref 78.0–100.0)
Platelets: 387 10*3/uL (ref 150–400)
RBC: 3.74 MIL/uL — ABNORMAL LOW (ref 3.87–5.11)
RDW: 16.2 % — AB (ref 11.5–15.5)
WBC: 8.9 10*3/uL (ref 4.0–10.5)

## 2016-05-20 LAB — BASIC METABOLIC PANEL
Anion gap: 8 (ref 5–15)
BUN: 20 mg/dL (ref 6–20)
CHLORIDE: 109 mmol/L (ref 101–111)
CO2: 26 mmol/L (ref 22–32)
CREATININE: 0.56 mg/dL (ref 0.44–1.00)
Calcium: 8.4 mg/dL — ABNORMAL LOW (ref 8.9–10.3)
GFR calc Af Amer: >60 mL/min (ref >60)
GFR calc non Af Amer: >60 mL/min (ref >60)
Glucose, Bld: 143 mg/dL — ABNORMAL HIGH (ref 65–99)
POTASSIUM: 3.3 mmol/L — AB (ref 3.5–5.1)
Sodium: 143 mmol/L (ref 135–145)

## 2016-05-20 MED ORDER — PREDNISONE 20 MG PO TABS
40.0000 mg | ORAL_TABLET | Freq: Every day | ORAL | Status: DC
  Filled 2016-05-20: qty 2

## 2016-05-20 NOTE — Progress Notes (Signed)
Subjective: Patient feels better. She more alert and awake, but remained anxious. Her breathing has improved. No fever. Her chest x-ray showed persistent opacity.. Objective: Vital signs in last 24 hours: Temp:  [97.9 F (36.6 C)-98.6 F (37 C)] 98.6 F (37 C) (02/25 0630) Pulse Rate:  [77-86] 86 (02/25 0630) Resp:  [16-18] 16 (02/25 0630) BP: (111-131)/(62-74) 128/74 (02/25 0630) SpO2:  [96 %-97 %] 97 % (02/25 0630) Weight change:  Last BM Date: 05/19/16  Intake/Output from previous day: 02/24 0701 - 02/25 0700 In: 120 [P.O.:120] Out: -   PHYSICAL EXAM General appearance: alert and no distress Resp: diminished breath sounds bilaterally and rhonchi bilaterally Cardio: S1, S2 normal GI: soft, non-tender; bowel sounds normal; no masses,  no organomegaly Extremities: extremities normal, atraumatic, no cyanosis or edema  Lab Results:  Results for orders placed or performed during the hospital encounter of 05/16/16 (from the past 48 hour(s))  Basic metabolic panel     Status: Abnormal   Collection Time: 05/20/16  5:58 AM  Result Value Ref Range   Sodium 143 135 - 145 mmol/L   Potassium 3.3 (L) 3.5 - 5.1 mmol/L   Chloride 109 101 - 111 mmol/L   CO2 26 22 - 32 mmol/L   Glucose, Bld 143 (H) 65 - 99 mg/dL   BUN 20 6 - 20 mg/dL   Creatinine, Ser 0.56 0.44 - 1.00 mg/dL   Calcium 8.4 (L) 8.9 - 10.3 mg/dL   GFR calc non Af Amer >60 >60 mL/min   GFR calc Af Amer >60 >60 mL/min    Comment: (NOTE) The eGFR has been calculated using the CKD EPI equation. This calculation has not been validated in all clinical situations. eGFR's persistently <60 mL/min signify possible Chronic Kidney Disease.    Anion gap 8 5 - 15  CBC     Status: Abnormal   Collection Time: 05/20/16  5:58 AM  Result Value Ref Range   WBC 8.9 4.0 - 10.5 K/uL   RBC 3.74 (L) 3.87 - 5.11 MIL/uL   Hemoglobin 10.3 (L) 12.0 - 15.0 g/dL   HCT 31.7 (L) 36.0 - 46.0 %   MCV 84.8 78.0 - 100.0 fL   MCH 27.5 26.0 - 34.0 pg    MCHC 32.5 30.0 - 36.0 g/dL   RDW 16.2 (H) 11.5 - 15.5 %   Platelets 387 150 - 400 K/uL    ABGS No results for input(s): PHART, PO2ART, TCO2, HCO3 in the last 72 hours.  Invalid input(s): PCO2 CULTURES Recent Results (from the past 240 hour(s))  Blood Culture (routine x 2)     Status: None (Preliminary result)   Collection Time: 05/16/16  2:18 AM  Result Value Ref Range Status   Specimen Description BLOOD LEFT ARM  Final   Special Requests BOTTLES DRAWN AEROBIC AND ANAEROBIC 10CC  Final   Culture NO GROWTH 3 DAYS  Final   Report Status PENDING  Incomplete  Blood Culture (routine x 2)     Status: None (Preliminary result)   Collection Time: 05/16/16  2:22 AM  Result Value Ref Range Status   Specimen Description BLOOD LEFT HAND  Final   Special Requests BOTTLES DRAWN AEROBIC AND ANAEROBIC [place]  Final   Culture NO GROWTH 3 DAYS  Final   Report Status PENDING  Incomplete  MRSA PCR Screening     Status: None   Collection Time: 05/16/16  4:59 AM  Result Value Ref Range Status   MRSA by PCR NEGATIVE NEGATIVE Final  Comment:        The GeneXpert MRSA Assay (FDA approved for NASAL specimens only), is one component of a comprehensive MRSA colonization surveillance program. It is not intended to diagnose MRSA infection nor to guide or monitor treatment for MRSA infections.    Studies/Results: Dg Chest 1 View  Result Date: 05/19/2016 CLINICAL DATA:  Pneumonia. EXAM: CHEST 1 VIEW COMPARISON:  05/16/2016 FINDINGS: Patient slightly rotated to the left. Lungs are adequately inflated with persistent airspace consolidation over the medial right mid to lower lung. No definite effusion. Remainder of the exam is unchanged. IMPRESSION: Persistent consolidation over the medial right mid to lower lung likely a pneumonia. Electronically Signed   By: Marin Olp M.D.   On: 05/19/2016 18:19    Medications: I have reviewed the patient's current medications.  Assesment:  Principal  Problem:   Healthcare-associated pneumonia Active Problems:   Psychosis   Depression   Chronic pain   Anemia, chronic disease   Acute respiratory failure with hypoxia (HCC)   Acute encephalopathy   Hypothyroidism   Influenza   Memory deficits    Plan:  Medications reviewed Will continue IV antibiotics  Will D/c solumedrol and start tapering prednisone Will do follow up chest x-ray Continue regular treatment Supportive care    LOS: 4 days   Zamar Odwyer 05/20/2016, 9:09 AM

## 2016-05-20 NOTE — Progress Notes (Signed)
Subjective: No new complaints. She looks better. She's anxious. She says she is still somewhat short of breath. No chest pain no fever no nausea or vomiting  Objective: Vital signs in last 24 hours: Temp:  [97.9 F (36.6 C)-98.6 F (37 C)] 98.6 F (37 C) (02/25 0630) Pulse Rate:  [77-86] 86 (02/25 0630) Resp:  [16-18] 16 (02/25 0630) BP: (111-131)/(62-74) 128/74 (02/25 0630) SpO2:  [96 %-97 %] 97 % (02/25 0630) Weight change:  Last BM Date: 05/19/16  Intake/Output from previous day: 02/24 0701 - 02/25 0700 In: 120 [P.O.:120] Out: -   PHYSICAL EXAM General appearance: alert and mild distress Resp: rhonchi bilaterally Cardio: regular rate and rhythm, S1, S2 normal, no murmur, click, rub or gallop GI: soft, non-tender; bowel sounds normal; no masses,  no organomegaly Extremities: extremities normal, atraumatic, no cyanosis or edema Skin warm and dry. Anxious  Lab Results:  Results for orders placed or performed during the hospital encounter of 05/16/16 (from the past 48 hour(s))  Basic metabolic panel     Status: Abnormal   Collection Time: 05/20/16  5:58 AM  Result Value Ref Range   Sodium 143 135 - 145 mmol/L   Potassium 3.3 (L) 3.5 - 5.1 mmol/L   Chloride 109 101 - 111 mmol/L   CO2 26 22 - 32 mmol/L   Glucose, Bld 143 (H) 65 - 99 mg/dL   BUN 20 6 - 20 mg/dL   Creatinine, Ser 0.56 0.44 - 1.00 mg/dL   Calcium 8.4 (L) 8.9 - 10.3 mg/dL   GFR calc non Af Amer >60 >60 mL/min   GFR calc Af Amer >60 >60 mL/min    Comment: (NOTE) The eGFR has been calculated using the CKD EPI equation. This calculation has not been validated in all clinical situations. eGFR's persistently <60 mL/min signify possible Chronic Kidney Disease.    Anion gap 8 5 - 15  CBC     Status: Abnormal   Collection Time: 05/20/16  5:58 AM  Result Value Ref Range   WBC 8.9 4.0 - 10.5 K/uL   RBC 3.74 (L) 3.87 - 5.11 MIL/uL   Hemoglobin 10.3 (L) 12.0 - 15.0 g/dL   HCT 31.7 (L) 36.0 - 46.0 %   MCV 84.8  78.0 - 100.0 fL   MCH 27.5 26.0 - 34.0 pg   MCHC 32.5 30.0 - 36.0 g/dL   RDW 16.2 (H) 11.5 - 15.5 %   Platelets 387 150 - 400 K/uL    ABGS No results for input(s): PHART, PO2ART, TCO2, HCO3 in the last 72 hours.  Invalid input(s): PCO2 CULTURES Recent Results (from the past 240 hour(s))  Blood Culture (routine x 2)     Status: None (Preliminary result)   Collection Time: 05/16/16  2:18 AM  Result Value Ref Range Status   Specimen Description BLOOD LEFT ARM  Final   Special Requests BOTTLES DRAWN AEROBIC AND ANAEROBIC 10CC  Final   Culture NO GROWTH 3 DAYS  Final   Report Status PENDING  Incomplete  Blood Culture (routine x 2)     Status: None (Preliminary result)   Collection Time: 05/16/16  2:22 AM  Result Value Ref Range Status   Specimen Description BLOOD LEFT HAND  Final   Special Requests BOTTLES DRAWN AEROBIC AND ANAEROBIC [place]  Final   Culture NO GROWTH 3 DAYS  Final   Report Status PENDING  Incomplete  MRSA PCR Screening     Status: None   Collection Time: 05/16/16  4:59 AM  Result Value Ref Range Status   MRSA by PCR NEGATIVE NEGATIVE Final    Comment:        The GeneXpert MRSA Assay (FDA approved for NASAL specimens only), is one component of a comprehensive MRSA colonization surveillance program. It is not intended to diagnose MRSA infection nor to guide or monitor treatment for MRSA infections.    Studies/Results: Dg Chest 1 View  Result Date: 05/19/2016 CLINICAL DATA:  Pneumonia. EXAM: CHEST 1 VIEW COMPARISON:  05/16/2016 FINDINGS: Patient slightly rotated to the left. Lungs are adequately inflated with persistent airspace consolidation over the medial right mid to lower lung. No definite effusion. Remainder of the exam is unchanged. IMPRESSION: Persistent consolidation over the medial right mid to lower lung likely a pneumonia. Electronically Signed   By: Marin Olp M.D.   On: 05/19/2016 18:19    Medications:  Prior to Admission:  Prescriptions Prior  to Admission  Medication Sig Dispense Refill Last Dose  . acetaminophen (TYLENOL) 500 MG tablet Take 500 mg by mouth every 6 (six) hours as needed for mild pain.   05/15/2016  . acidophilus (RISAQUAD) CAPS capsule Take 1 capsule by mouth daily.   05/16/2016 at Unknown time  . alum & mag hydroxide-simeth (MAALOX/MYLANTA) 200-200-20 MG/5ML suspension Take by mouth every 6 (six) hours as needed for indigestion or heartburn.   unknown  . atorvastatin (LIPITOR) 10 MG tablet Take 10 mg by mouth daily.   05/17/2016 at Unknown time  . buPROPion (WELLBUTRIN XL) 300 MG 24 hr tablet Take 300 mg by mouth daily.   05/16/2016  . calcium & magnesium carbonates (MYLANTA) 212-248 MG tablet Take 1 tablet by mouth daily.     . calcium carbonate (CALCIUM 600) 1500 (600 Ca) MG TABS tablet Take by mouth 2 (two) times daily with a meal.   05/17/2016 at Unknown time  . chlorhexidine (PERIDEX) 0.12 % solution Use as directed 15 mLs in the mouth or throat 2 (two) times daily.   05/05/2016  . Cholecalciferol 50000 units capsule Take 50,000 Units by mouth once a week.   05/09/2016 at Unknown time  . collagenase (SANTYL) ointment Apply 1 application topically daily. Apply to right lateral foot.   05/17/2016 at Unknown time  . denosumab (PROLIA) 60 MG/ML SOLN injection Inject 60 mg into the skin every 6 (six) months. Administer in upper arm, thigh, or abdomen   Taking  . Dextromethorphan-Guaifenesin (MUCINEX DM) 30-600 MG TB12 Take 2 tablets by mouth 2 (two) times daily as needed.   05/11/2016  . dicyclomine (BENTYL) 10 MG capsule Take 1 capsule (10 mg total) by mouth 4 (four) times daily -  before meals and at bedtime. 40 capsule 0 05/17/2016 at Unknown time  . gabapentin (NEURONTIN) 300 MG capsule Take 300 mg by mouth 3 (three) times daily.   05/17/2016 at Unknown time  . ibuprofen (ADVIL,MOTRIN) 200 MG tablet Take 600 mg by mouth every 8 (eight) hours as needed.   05/12/2016 at Unknown time  . levofloxacin (LEVAQUIN) 750 MG tablet Take  750 mg by mouth daily.   05/16/2016  . levothyroxine (SYNTHROID, LEVOTHROID) 25 MCG tablet Take 50 mcg by mouth daily before breakfast.    05/16/2016  . loperamide (IMODIUM) 2 MG capsule Take 2-4 mg by mouth 4 (four) times daily as needed. For diarrhea, take 2 tablets (69m) initially then take 2 mg after each loose stool   unknown  . LORazepam (ATIVAN) 0.5 MG tablet Take 0.5 mg by mouth daily.  05/16/2016 at Unknown time  . LORazepam (ATIVAN) 1 MG tablet Take 1 mg by mouth 2 (two) times daily.   05/17/2016 at Unknown time  . mirtazapine (REMERON) 45 MG tablet Take 45 mg by mouth every evening.   05/16/2016  . morphine (MS CONTIN) 30 MG 12 hr tablet Take 30 mg by mouth every 12 (twelve) hours.   05/17/2016 at Unknown time  . Nutritional Supplements (RESOURCE 2.0) LIQD Take 120 mLs by mouth 2 (two) times daily.    unknown  . OLANZapine (ZYPREXA) 15 MG tablet Take 15 mg by mouth at bedtime.   05/16/2016  . omeprazole (PRILOSEC) 20 MG capsule Take 20 mg by mouth daily.   05/17/2016  . oseltamivir (TAMIFLU) 75 MG capsule Take 75 mg by mouth.   unknown  . oxybutynin (DITROPAN-XL) 5 MG 24 hr tablet Take 5 mg by mouth at bedtime.   05/17/2016 at Unknown time  . Oxycodone HCl 10 MG TABS Take 10 mg by mouth every 6 (six) hours as needed (moderate to severe pain).   05/12/2016  . polyvinyl alcohol (LIQUIFILM TEARS) 1.4 % ophthalmic solution Place 1 drop into both eyes as needed for dry eyes.   05/17/2016 at Unknown time  . senna-docusate (SENNA PLUS) 8.6-50 MG per tablet Take 2 tablets by mouth 2 (two) times daily.   05/17/2016 at Unknown time  . simvastatin (ZOCOR) 20 MG tablet Take 20 mg by mouth at bedtime.    05/17/2016 at Unknown time   Scheduled: . acidophilus  1 capsule Oral Daily  . atorvastatin  10 mg Oral Daily  . buPROPion  300 mg Oral Daily  . ceFEPime (MAXIPIME) IV  2 g Intravenous Q12H  . chlorhexidine  15 mL Mouth/Throat BID  . dicyclomine  10 mg Oral TID AC & HS  . enoxaparin (LOVENOX) injection  40  mg Subcutaneous Q24H  . feeding supplement  1 Container Oral BID  . feeding supplement (PRO-STAT SUGAR FREE 64)  30 mL Oral BID  . gabapentin  300 mg Oral TID  . levothyroxine  50 mcg Oral QAC breakfast  . mirtazapine  45 mg Oral QPM  . morphine  30 mg Oral Q12H  . OLANZapine  15 mg Oral QHS  . oseltamivir  75 mg Oral BID  . oxybutynin  5 mg Oral QHS  . pantoprazole  40 mg Oral Daily  . [START ON 05/21/2016] predniSONE  40 mg Oral Q breakfast  . senna-docusate  2 tablet Oral BID  . vancomycin  500 mg Intravenous Q12H   Continuous:  QQI:[id number], alum & mag hydroxide-simeth, dextromethorphan-guaiFENesin, oxyCODONE, polyvinyl alcohol  Assesment: She has influenza with healthcare associated pneumonia associated with that. She has acute hypoxic respiratory failure. She is improving. Principal Problem:   Healthcare-associated pneumonia Active Problems:   Psychosis   Depression   Chronic pain   Anemia, chronic disease   Acute respiratory failure with hypoxia (HCC)   Acute encephalopathy   Hypothyroidism   Influenza   Memory deficits    Plan: No change in meds    LOS: 4 days   Gayna Braddy L 05/20/2016, 10:17 AM

## 2016-05-21 ENCOUNTER — Ambulatory Visit (HOSPITAL_COMMUNITY): Payer: Medicare Other

## 2016-05-21 LAB — CULTURE, BLOOD (ROUTINE X 2)
CULTURE: NO GROWTH
Culture: NO GROWTH

## 2016-05-21 MED ORDER — PREDNISONE 10 MG (21) PO TBPK: ORAL_TABLET | ORAL | 0 refills | Status: DC

## 2016-05-21 MED ORDER — AMOXICILLIN-POT CLAVULANATE 500-125 MG PO TABS: 1.0000 | ORAL_TABLET | Freq: Three times a day (TID) | ORAL | 0 refills | Status: DC

## 2016-05-21 MED ORDER — MORPHINE SULFATE ER 30 MG PO TBCR: 30.0000 mg | EXTENDED_RELEASE_TABLET | Freq: Two times a day (BID) | ORAL | 0 refills | Status: DC

## 2016-05-21 MED ORDER — OXYCODONE HCL 10 MG PO TABS: 10.0000 mg | ORAL_TABLET | Freq: Four times a day (QID) | ORAL | 0 refills | Status: DC | PRN

## 2016-05-21 MED ORDER — POTASSIUM CHLORIDE CRYS ER 20 MEQ PO TBCR
40.0000 meq | EXTENDED_RELEASE_TABLET | Freq: Once | ORAL | Status: DC
  Filled 2016-05-21: qty 2

## 2016-05-21 NOTE — Care Management Important Message (Signed)
Important Message  Patient Details  Name: Cheron EveryBetty J Austin MRN: [id number] Date of Birth: 1945/06/11   Medicare Important Message Given:  Yes    Malcolm MetroChildress, Marsa Matteo Demske, RN 05/21/2016, 8:15 AM

## 2016-05-21 NOTE — Progress Notes (Signed)
Attempted to administer patient's medications this am, but patient spit all her medication out. Dr. Felecia ShellingFanta aware. Patient is being discharge today.

## 2016-05-21 NOTE — Progress Notes (Signed)
Finally able to call report to [facility]. IV CATH removed and intact. No c/o pain at this time or at site. Awaiting transport via EMS back to facility.

## 2016-05-21 NOTE — Progress Notes (Signed)
Nutrition Consult Follow-up     INTERVENTION:  Change Boost Breeze  to Ensure Enlive TID  Encourage meal intake and assist as needed to maximize oral intake   NUTRITION DIAGNOSIS:   Increased nutrient needs related to acute illness as evidenced by estimated needs; ongoing  GOAL:   Patient will meet greater than or equal to 90% of their needs; Unmet  MONITOR:   PO intake, Weight trends, I & O's, Supplement acceptance   ASSESSMENT:  70 y.o. female with PMH for depression with psycho53sis, memory deficits, chronic pain on opiates, hypothyroidism, and GERD who presents from her nursing home for evaluation of high fevers and altered mental status after being diagnosed with influenza and bacterial pneumonia.    RD received consult due to poor po. Pt assessed on 2/21 by RD  and po intake has been poor since admission (0-25%) on time yesterday documented intake of 75% (lunch). Patient is sleeping soundly at 0930 this morning and her breakfast is still here untouched.   Pt's weight hx has been fairly stable and shows wt on admission 5# above wt in October. No significant weight changes for time frame.   Per nutrition focused physical exam pt showed mild-moderate muscle depletion, no fat depletion and no edema. Depletions likely associated with age-related sarcopenia not malnutrition due to weight status.  Recent Labs Lab 05/16/16 0200 05/20/16 0558  NA 139 143  K 3.6 3.3*  CL 103 109  CO2 27 26  BUN 21* 20  CREATININE 0.70 0.56  CALCIUM 8.8* 8.4*  GLUCOSE 120* 143*     Labs reviewed; Serum glucose (143), potassium 3.3 Medications reviewed; 45 mg Remeron, 40 mg Protonix, 2 tablet Senokot-S BID  Diet Order:  Diet regular Room service appropriate? Yes; Fluid consistency: Thin  Skin:  Wound (see comment) (deep tissue injury at mid sacrum)  Last BM:  2/25  Height:   Ht Readings from Last 1 Encounters:  05/16/16 5\' 7"  (1.702 m)    Weight:   Wt Readings from Last 1  Encounters:  05/16/16 126 lb (57.2 kg)    Ideal Body Weight:  61.4 kg  BMI:  Body mass index is 19.73 kg/m.  Estimated Nutritional Needs:   Kcal:  1600-1800 (28-31 kcal/kg)  Protein:  75-85 (1.3-1.5 g/kg)  Fluid:  >/= 1.6 L/d  EDUCATION NEEDS:   Education needs no appropriate at this time  Royann ShiversLynn Keyaira Clapham MS,RD,CSG,LDN Office: [phone] Pager: [phone]

## 2016-05-21 NOTE — Discharge Summary (Signed)
Physician Discharge Summary  Patient ID: [name] MRN: [id number] DOB/AGE: 08-31-1945 71 y.o. Primary Care Physician:Alverda Nazzaro, MD Admit date: 05/16/2016 Discharge date: 05/21/2016    Discharge Diagnoses:   Principal Problem:   Healthcare-associated pneumonia Active Problems:   Psychosis   Depression   Chronic pain   Anemia, chronic disease   Acute respiratory failure with hypoxia (HCC)   Acute encephalopathy   Hypothyroidism   Influenza   Memory deficits   Allergies as of 05/21/2016      Reactions   Diphenhydramine Other (See Comments)   Feels bad   Tape Dermatitis      Medication List    STOP taking these medications   levofloxacin 750 MG tablet Commonly known as:  LEVAQUIN   oseltamivir 75 MG capsule Commonly known as:  TAMIFLU     TAKE these medications   acetaminophen 500 MG tablet Commonly known as:  TYLENOL Take 500 mg by mouth every 6 (six) hours as needed for mild pain.   acidophilus Caps capsule Take 1 capsule by mouth daily.   alum & mag hydroxide-simeth 200-200-20 MG/5ML suspension Commonly known as:  MAALOX/MYLANTA Take by mouth every 6 (six) hours as needed for indigestion or heartburn.   amoxicillin-clavulanate 500-125 MG tablet Commonly known as:  AUGMENTIN Take 1 tablet (500 mg total) by mouth 3 (three) times daily.   atorvastatin 10 MG tablet Commonly known as:  LIPITOR Take 10 mg by mouth daily.   buPROPion 300 MG 24 hr tablet Commonly known as:  WELLBUTRIN XL Take 300 mg by mouth daily.   calcium & magnesium carbonates 311-232 MG tablet Commonly known as:  MYLANTA Take 1 tablet by mouth daily.   CALCIUM 600 1500 (600 Ca) MG Tabs tablet Generic drug:  calcium carbonate Take by mouth 2 (two) times daily with a meal.   chlorhexidine 0.12 % solution Commonly known as:  PERIDEX Use as directed 15 mLs in the mouth or throat 2 (two) times daily.   Cholecalciferol 50000 units capsule Take 50,000 Units by mouth once a  week.   denosumab 60 MG/ML Soln injection Commonly known as:  PROLIA Inject 60 mg into the skin every 6 (six) months. Administer in upper arm, thigh, or abdomen   dicyclomine 10 MG capsule Commonly known as:  BENTYL Take 1 capsule (10 mg total) by mouth 4 (four) times daily -  before meals and at bedtime.   gabapentin 300 MG capsule Commonly known as:  NEURONTIN Take 300 mg by mouth 3 (three) times daily.   ibuprofen 200 MG tablet Commonly known as:  ADVIL,MOTRIN Take 600 mg by mouth every 8 (eight) hours as needed.   levothyroxine 25 MCG tablet Commonly known as:  SYNTHROID, LEVOTHROID Take 50 mcg by mouth daily before breakfast.   loperamide 2 MG capsule Commonly known as:  IMODIUM Take 2-4 mg by mouth 4 (four) times daily as needed. For diarrhea, take 2 tablets (4mg ) initially then take 2 mg after each loose stool   LORazepam 0.5 MG tablet Commonly known as:  ATIVAN Take 0.5 mg by mouth daily.   LORazepam 1 MG tablet Commonly known as:  ATIVAN Take 1 mg by mouth 2 (two) times daily.   mirtazapine 45 MG tablet Commonly known as:  REMERON Take 45 mg by mouth every evening.   morphine 30 MG 12 hr tablet Commonly known as:  MS CONTIN Take 30 mg by mouth every 12 (twelve) hours.   MUCINEX DM 30-600 MG Tb12 Take 2 tablets  by mouth 2 (two) times daily as needed.   OLANZapine 15 MG tablet Commonly known as:  ZYPREXA Take 15 mg by mouth at bedtime.   omeprazole 20 MG capsule Commonly known as:  PRILOSEC Take 20 mg by mouth daily.   oxybutynin 5 MG 24 hr tablet Commonly known as:  DITROPAN-XL Take 5 mg by mouth at bedtime.   Oxycodone HCl 10 MG Tabs Take 10 mg by mouth every 6 (six) hours as needed (moderate to severe pain).   polyvinyl alcohol 1.4 % ophthalmic solution Commonly known as:  LIQUIFILM TEARS Place 1 drop into both eyes as needed for dry eyes.   predniSONE 10 MG (21) Tbpk tablet Commonly known as:  STERAPRED UNI-PAK 21 TAB 4 tab po daily for 3  days, 3 tab po daily for 3 days, 2 tab po daily for 3 days, 1 tab po daily for 3 days.   RESOURCE 2.0 Liqd Take 120 mLs by mouth 2 (two) times daily.   SANTYL ointment Generic drug:  collagenase Apply 1 application topically daily. Apply to right lateral foot.   SENNA PLUS 8.6-50 MG tablet Generic drug:  senna-docusate Take 2 tablets by mouth 2 (two) times daily.   simvastatin 20 MG tablet Commonly known as:  ZOCOR Take 20 mg by mouth at bedtime.       Discharged Condition: improved    Consults: Pulmonary  Significant Diagnostic Studies: Dg Chest 1 View  Result Date: 05/19/2016 CLINICAL DATA:  Pneumonia. EXAM: CHEST 1 VIEW COMPARISON:  05/16/2016 FINDINGS: Patient slightly rotated to the left. Lungs are adequately inflated with persistent airspace consolidation over the medial right mid to lower lung. No definite effusion. Remainder of the exam is unchanged. IMPRESSION: Persistent consolidation over the medial right mid to lower lung likely a pneumonia. Electronically Signed   By: Elberta Fortis M.D.   On: 05/19/2016 18:19   Dg Chest Port 1 View  Result Date: 05/16/2016 CLINICAL DATA:  71 year old female with fever. EXAM: PORTABLE CHEST 1 VIEW COMPARISON:  Chest radiograph dated 03/28/2013 FINDINGS: Patchy areas of airspace density in the right mid and lower lung field as well as at the left lung base noted. Mild interstitial prominence may represent mild edema. There is no pleural effusion or pneumothorax. Stable mild cardiomegaly. No acute osseous pathology. IMPRESSION: Bilateral hazy airspace densities concerning for infiltrate. Clinical correlation and follow-up recommended. Electronically Signed   By: Elgie Collard M.D.   On: 05/16/2016 02:32    Lab Results: Basic Metabolic Panel:  Recent Labs  40/98/11 0558  NA 143  K 3.3*  CL 109  CO2 26  GLUCOSE 143*  BUN 20  CREATININE 0.56  CALCIUM 8.4*   Liver Function Tests: No results for input(s): AST, ALT, ALKPHOS,  BILITOT, PROT, ALBUMIN in the last 72 hours.   CBC:  Recent Labs  05/20/16 0558  WBC 8.9  HGB 10.3*  HCT 31.7*  MCV 84.8  PLT 387    Recent Results (from the past 240 hour(s))  Blood Culture (routine x 2)     Status: None (Preliminary result)   Collection Time: 05/16/16  2:18 AM  Result Value Ref Range Status   Specimen Description BLOOD LEFT ARM  Final   Special Requests BOTTLES DRAWN AEROBIC AND ANAEROBIC 10CC  Final   Culture NO GROWTH 3 DAYS  Final   Report Status PENDING  Incomplete  Blood Culture (routine x 2)     Status: None (Preliminary result)   Collection Time: 05/16/16  2:22 AM  Result Value Ref Range Status   Specimen Description BLOOD LEFT HAND  Final   Special Requests BOTTLES DRAWN AEROBIC AND ANAEROBIC 7CC  Final   Culture NO GROWTH 3 DAYS  Final   Report Status PENDING  Incomplete  MRSA PCR Screening     Status: None   Collection Time: 05/16/16  4:59 AM  Result Value Ref Range Status   MRSA by PCR NEGATIVE NEGATIVE Final    Comment:        The GeneXpert MRSA Assay (FDA approved for NASAL specimens only), is one component of a comprehensive MRSA colonization surveillance program. It is not intended to diagnose MRSA infection nor to guide or monitor treatment for MRSA infections.      Hospital Course:   This is a 71 years old female from Nursing home was admitted due to change in mental status, fever and cough. Her chest x-ray showed right lung opacity and and haziness. Patient also hypoxemia and respiratory insuffiencey. She was started on combination of IV antibiotics and IV steroid. Pulmonary consult was done and recommendations followed. Patient was also empirically treated for influenza with Tamiflu. Over the hospital stay patient improved. Her fever has subsided and her mental status has improved. She is back to her baseline. Patient will be discharged on taperiing dose of prednisone and oral antibiotics.  Discharge Exam: Blood pressure 134/72,  pulse 89, temperature 98.6 F (37 C), temperature source Oral, resp. rate 18, height 5\' 7"  (1.702 m), weight 57.2 kg (126 lb), SpO2 97 %.    Disposition:  Skilled nursing home    Contact information for after-discharge care    Destination    [facility] .   Specialty:  Skilled Nursing Facility Contact information: [address] [place] [address] [phone]              Signed: Avon Gully   05/21/2016, 8:07 AM

## 2016-05-21 NOTE — Clinical Social Work Note (Signed)
Pt d/c today back to [facility]. [name] Ron aware and agreeable as well as facility. Pt will transport via [facility].   Derenda FennelKara Apryll Hinkle, LCSW [phone]

## 2016-05-21 NOTE — Progress Notes (Signed)
She is being sent back to the skilled care facility. She looks much better. She is awake and alert. No distress. Her chest has cleared markedly.  She will need follow-up chest x-ray to document clearing.

## 2016-06-04 ENCOUNTER — Ambulatory Visit (HOSPITAL_COMMUNITY): Payer: Medicare Other

## 2016-06-08 ENCOUNTER — Other Ambulatory Visit (HOSPITAL_COMMUNITY): Payer: Self-pay | Admitting: Geriatric Medicine

## 2016-06-08 ENCOUNTER — Ambulatory Visit (HOSPITAL_COMMUNITY)
Admission: RE | Admit: 2016-06-08 | Discharge: 2016-06-08 | Disposition: A | Discharge status: Home / Self Care | Payer: Medicare Other | Source: Ambulatory Visit | Attending: Geriatric Medicine | Admitting: Geriatric Medicine

## 2016-06-08 DIAGNOSIS — I739 Peripheral vascular disease, unspecified: Secondary | ICD-10-CM | POA: Insufficient documentation

## 2016-06-08 DIAGNOSIS — R4182 Altered mental status, unspecified: Secondary | ICD-10-CM

## 2016-06-15 ENCOUNTER — Ambulatory Visit (HOSPITAL_COMMUNITY): Payer: Medicare Other

## 2016-07-09 ENCOUNTER — Encounter (HOSPITAL_COMMUNITY)
Admission: EM | Disposition: A | Discharge status: Home / Self Care | Payer: Self-pay | Source: Home / Self Care | Attending: Emergency Medicine

## 2016-07-09 ENCOUNTER — Emergency Department (HOSPITAL_COMMUNITY): Payer: Medicare Other

## 2016-07-09 ENCOUNTER — Emergency Department (HOSPITAL_COMMUNITY)
Admission: EM | Admit: 2016-07-09 | Discharge: 2016-07-09 | Disposition: A | Discharge status: Home / Self Care | Payer: Medicare Other | Attending: Emergency Medicine | Admitting: Emergency Medicine

## 2016-07-09 ENCOUNTER — Encounter (HOSPITAL_COMMUNITY): Payer: Self-pay | Admitting: *Deleted

## 2016-07-09 DIAGNOSIS — K219 Gastro-esophageal reflux disease without esophagitis: Secondary | ICD-10-CM | POA: Insufficient documentation

## 2016-07-09 DIAGNOSIS — Z7901 Long term (current) use of anticoagulants: Secondary | ICD-10-CM | POA: Insufficient documentation

## 2016-07-09 DIAGNOSIS — R1310 Dysphagia, unspecified: Secondary | ICD-10-CM

## 2016-07-09 DIAGNOSIS — D638 Anemia in other chronic diseases classified elsewhere: Secondary | ICD-10-CM | POA: Insufficient documentation

## 2016-07-09 DIAGNOSIS — F419 Anxiety disorder, unspecified: Secondary | ICD-10-CM | POA: Insufficient documentation

## 2016-07-09 DIAGNOSIS — Z79899 Other long term (current) drug therapy: Secondary | ICD-10-CM | POA: Diagnosis not present

## 2016-07-09 DIAGNOSIS — Z96641 Presence of right artificial hip joint: Secondary | ICD-10-CM | POA: Diagnosis not present

## 2016-07-09 DIAGNOSIS — T18128A Food in esophagus causing other injury, initial encounter: Secondary | ICD-10-CM | POA: Insufficient documentation

## 2016-07-09 DIAGNOSIS — R1314 Dysphagia, pharyngoesophageal phase: Secondary | ICD-10-CM | POA: Diagnosis present

## 2016-07-09 DIAGNOSIS — F329 Major depressive disorder, single episode, unspecified: Secondary | ICD-10-CM | POA: Diagnosis not present

## 2016-07-09 DIAGNOSIS — X58XXXA Exposure to other specified factors, initial encounter: Secondary | ICD-10-CM | POA: Insufficient documentation

## 2016-07-09 DIAGNOSIS — E039 Hypothyroidism, unspecified: Secondary | ICD-10-CM | POA: Insufficient documentation

## 2016-07-09 DIAGNOSIS — Z87891 Personal history of nicotine dependence: Secondary | ICD-10-CM | POA: Insufficient documentation

## 2016-07-09 DIAGNOSIS — Z79891 Long term (current) use of opiate analgesic: Secondary | ICD-10-CM | POA: Diagnosis not present

## 2016-07-09 DIAGNOSIS — M810 Age-related osteoporosis without current pathological fracture: Secondary | ICD-10-CM | POA: Insufficient documentation

## 2016-07-09 DIAGNOSIS — Z7983 Long term (current) use of bisphosphonates: Secondary | ICD-10-CM | POA: Insufficient documentation

## 2016-07-09 HISTORY — DX: Other specified postprocedural states: R11.2

## 2016-07-09 HISTORY — DX: Other specified postprocedural states: Z98.890

## 2016-07-09 HISTORY — DX: Other complications of anesthesia, initial encounter: T88.59XA

## 2016-07-09 HISTORY — PX: ESOPHAGOGASTRODUODENOSCOPY: SHX5428

## 2016-07-09 HISTORY — DX: Adverse effect of unspecified anesthetic, initial encounter: T41.45XA

## 2016-07-09 LAB — CBC WITH DIFFERENTIAL/PLATELET
BASOS ABS: 0 10*3/uL (ref 0.0–0.1)
BASOS PCT: 0 %
EOS PCT: 6 %
Eosinophils Absolute: 0.3 10*3/uL (ref 0.0–0.7)
HEMATOCRIT: 36.3 % (ref 36.0–46.0)
Hemoglobin: 11.1 g/dL — ABNORMAL LOW (ref 12.0–15.0)
LYMPHS PCT: 26 %
Lymphs Abs: 1.3 10*3/uL (ref 0.7–4.0)
MCH: 26.6 pg (ref 26.0–34.0)
MCHC: 30.6 g/dL (ref 30.0–36.0)
MCV: 86.8 fL (ref 78.0–100.0)
MONO ABS: 0.4 10*3/uL (ref 0.1–1.0)
Monocytes Relative: 8 %
NEUTROS ABS: 3 10*3/uL (ref 1.7–7.7)
Neutrophils Relative %: 60 %
PLATELETS: 307 10*3/uL (ref 150–400)
RBC: 4.18 MIL/uL (ref 3.87–5.11)
RDW: 16.2 % — AB (ref 11.5–15.5)
WBC: 5 10*3/uL (ref 4.0–10.5)

## 2016-07-09 LAB — COMPREHENSIVE METABOLIC PANEL
ALT: 7 U/L — ABNORMAL LOW (ref 14–54)
ANION GAP: 6 (ref 5–15)
AST: 14 U/L — ABNORMAL LOW (ref 15–41)
Albumin: 2.8 g/dL — ABNORMAL LOW (ref 3.5–5.0)
Alkaline Phosphatase: 83 U/L (ref 38–126)
BILIRUBIN TOTAL: 0.1 mg/dL — AB (ref 0.3–1.2)
BUN: 19 mg/dL (ref 6–20)
CO2: 28 mmol/L (ref 22–32)
Calcium: 8.5 mg/dL — ABNORMAL LOW (ref 8.9–10.3)
Chloride: 105 mmol/L (ref 101–111)
Creatinine, Ser: 0.64 mg/dL (ref 0.44–1.00)
Glucose, Bld: 91 mg/dL (ref 65–99)
POTASSIUM: 3.9 mmol/L (ref 3.5–5.1)
Sodium: 139 mmol/L (ref 135–145)
TOTAL PROTEIN: 5.4 g/dL — AB (ref 6.5–8.1)

## 2016-07-09 LAB — LACTIC ACID, PLASMA
LACTIC ACID, VENOUS: 1.2 mmol/L (ref 0.5–1.9)
LACTIC ACID, VENOUS: 0.9 mmol/L (ref 0.5–1.9)

## 2016-07-09 LAB — TROPONIN I: Troponin I: <0.03 ng/mL (ref <0.03)

## 2016-07-09 SURGERY — EGD (ESOPHAGOGASTRODUODENOSCOPY)
Anesthesia: Moderate Sedation

## 2016-07-09 MED ORDER — LIDOCAINE VISCOUS 2 % MT SOLN
OROMUCOSAL | Status: AC
  Filled 2016-07-09: qty 15

## 2016-07-09 MED ORDER — MEPERIDINE HCL 100 MG/ML IJ SOLN
INTRAMUSCULAR | Status: AC
  Filled 2016-07-09: qty 2

## 2016-07-09 MED ORDER — SODIUM CHLORIDE 0.9 % IV SOLN
INTRAVENOUS | Status: DC | PRN
  Administered 2016-07-09: 250 mL via INTRAMUSCULAR

## 2016-07-09 MED ORDER — MIDAZOLAM HCL 5 MG/5ML IJ SOLN
INTRAMUSCULAR | Status: AC
  Filled 2016-07-09: qty 10

## 2016-07-09 MED ORDER — SODIUM CHLORIDE 0.9 % IV BOLUS (SEPSIS)
500.0000 mL | Freq: Once | INTRAVENOUS | Status: AC
  Administered 2016-07-09: 500 mL via INTRAVENOUS

## 2016-07-09 MED ORDER — ONDANSETRON HCL 4 MG/2ML IJ SOLN
INTRAMUSCULAR | Status: AC
  Filled 2016-07-09: qty 2

## 2016-07-09 MED ORDER — SIMETHICONE 40 MG/0.6ML PO SUSP
ORAL | Status: DC | PRN
  Administered 2016-07-09: 2.5 mL

## 2016-07-09 MED ORDER — LIDOCAINE VISCOUS 2 % MT SOLN
OROMUCOSAL | Status: DC | PRN
  Administered 2016-07-09: 1 via OROMUCOSAL

## 2016-07-09 NOTE — ED Notes (Signed)
Pt taken to xray 

## 2016-07-09 NOTE — Progress Notes (Signed)
Report given to Sheron Nightingale along with Discharge instructions from Dr. Jena Gauss. To be given to patient at time of discharge.

## 2016-07-09 NOTE — Op Note (Signed)
[facility] Patient Name: [name] Procedure Date: 07/09/2016 4:15 PM MRN: [id number] Date of Birth: March 04, 1946 Attending MD: Gennette Pac , MD CSN: [id number] Age: 71 Admit Type: Outpatient Procedure:                Upper GI endoscopy with removal of food impaction Indications:              Dysphagia Providers:                Gennette Pac, MD, Nena Polio, RN, Dyann Ruddle Referring MD:              Medicines:                Midazolam 0 mg IV, Meperidine 0 mg IV; xylocaine                            gel orally Complications:            No immediate complications. Estimated Blood Loss:     Estimated blood loss was minimal. Procedure:                Pre-Anesthesia Assessment:                           - Prior to the procedure, a History and Physical                            was performed, and patient medications and                            allergies were reviewed. The patient's tolerance of                            previous anesthesia was also reviewed. The risks                            and benefits of the procedure and the sedation                            options and risks were discussed with the patient.                            All questions were answered, and informed consent                            was obtained. Prior Anticoagulants: The patient                            last took Xarelto (rivaroxaban) on the day of the                            procedure. ASA Grade Assessment: III - A patient  with severe systemic disease. After reviewing the                            risks and benefits, the patient was deemed in                            satisfactory condition to undergo the procedure.                           After obtaining informed consent, the endoscope was                            passed under direct vision. Throughout the                            procedure, the  patient's blood pressure, pulse, and                            oxygen saturations were monitored continuously. The                            EG-[id number] ([id number]) was introduced through the                            mouth, and advanced to the gastric cardia. The                            upper GI endoscopy was accomplished without                            difficulty. The patient tolerated the procedure                            fairly well. The patient tolerated the procedure                            well. Scope In: 5:07:00 PM Scope Out: 5:17:09 PM Total Procedure Duration: 0 hours 10 minutes 9 seconds  Findings:      food bolus stuck ithe upper esophageal sphincter. I engaged it with both       the chery picker and rat-tooth forceps?" got a couple pieces out. With       this maneuver, the food bolus dropped into the mid esophagus. With the       tip of the scope, I easily pushed it across the GE junction; very large       hiatal hernia present; no attempt at completing examination carried out       per plan. Impression:               - Esophageal food impaction. Status post                            disimpaction as described above. Large hiatal  hernia. Incomplete exam per plan. Moderate Sedation:      none given Recommendation:           - Patient has a contact number available for                            emergencies. The signs and symptoms of potential                            delayed complications were discussed with the                            patient. Return to normal activities tomorrow.                            Written discharge instructions were provided to the                            patient.                           - Chopped diet.                           - Continue present medications.                           - No repeat upper endoscopy.                           - Return to my ED for further evaluation. Barium                             pill esophagram to further evaluate dysphagia later                            this week. Further recommendations to follow Procedure Code(s):        --- Professional ---                           43200, Esophagoscopy, flexible, transoral;                            diagnostic, including collection of specimen(s) by                            brushing or washing, when performed (separate                            procedure) Diagnosis Code(s):        --- Professional ---                           R13.10, Dysphagia, unspecified CPT copyright 2016 [employer]. All rights reserved. The codes documented in this report are preliminary and upon coder review may  be revised to meet current compliance requirements. Gerrit Friends. Jena Gauss, MD Molly Maduro  Roetta Sessions, MD 07/09/2016 5:32:05 PM This report has been signed electronically. Number of Addenda: 0

## 2016-07-09 NOTE — Discharge Instructions (Addendum)
EGD Discharge instructions Please read the instructions outlined below and refer to this sheet in the next few weeks. These discharge instructions provide you with general information on caring for yourself after you leave the hospital. Your doctor may also give you specific instructions. While your treatment has been planned according to the most current medical practices available, unavoidable complications occasionally occur. If you have any problems or questions after discharge, please call your doctor. ACTIVITY  You may resume your regular activity but move at a slower pace for the next 24 hours.   Take frequent rest periods for the next 24 hours.   Walking will help expel (get rid of) the air and reduce the bloated feeling in your abdomen.   No driving for 24 hours (because of the anesthesia (medicine) used during the test).   You may shower.   Do not sign any important legal documents or operate any machinery for 24 hours (because of the anesthesia used during the test).  NUTRITION  Drink plenty of fluids.   You may resume your normal diet.   Begin with a light meal and progress to your normal diet.   Avoid alcoholic beverages for 24 hours or as instructed by your caregiver.  MEDICATIONS  You may resume your normal medications unless your caregiver tells you otherwise.  WHAT YOU CAN EXPECT TODAY  You may experience abdominal discomfort such as a feeling of fullness or gas pains.  FOLLOW-UP  Your doctor will discuss the results of your test with you.  SEEK IMMEDIATE MEDICAL ATTENTION IF ANY OF THE FOLLOWING OCCUR:  Excessive nausea (feeling sick to your stomach) and/or vomiting.   Severe abdominal pain and distention (swelling).   Trouble swallowing.   Temperature over 101 F (37.8 C).   Rectal bleeding or vomiting of blood.   Dysphagia 2 diet  Barium pill esophagram to further evaluate dysphagia later this week  Further recommendations to follow.  Go back  to the emergency department for further evaluation per plan.

## 2016-07-09 NOTE — ED Notes (Signed)
Report given to patient's nurse at [facility], patient will be transported back to facility via ambulance.

## 2016-07-09 NOTE — H&P (Signed)
 @   Primary Care Physician:  Avon Gully, MD Primary Gastroenterologist:  Dr. Jena Gauss  Pre-Procedure History & Physical: HPI:  [name] is a 71 y.o. female here for probable food impaction. Assisted living resident who was eating [place] steak around noon today;  felt a bolus hang behind her breast pain. Has not been able to swallow even liquids very well since that time. Dr. Lamar Sprinkles, EDP, saw her and called me to see her regarding probable esophageal food impaction. Patient does describe intermittent esophageal dysphagia. She does have dentures. Chronically anticoagulated. Denies GERD symptoms. History of EGD back in 2003 which demonstrated only a sliding hiatal hernia. Reportedly, she underwent empiric dilation at that time for dysphagia.  Blood pressures have been somewhat soft in the ED in the 100 range systolic. Patient appears to be in no distress otherwise.   Past Medical History:  Diagnosis Date  . Anemia   . Anemia   . Anxiety and depression   . Bacteremia   . Cellulitis   . Chronic back pain   . Chronic diarrhea   . Chronic right hip pain   . Depression   . GERD (gastroesophageal reflux disease)   . Gout   . Gout   . Hypopotassemia   . IBS (irritable bowel syndrome)   . Muscle weakness (generalized)   . Osteoporosis   . Pressure ulcer of buttock   . Psychosis     Past Surgical History:  Procedure Laterality Date  . HIP ARTHROPLASTY  05/01/2011   Procedure: right ARTHROPLASTY BIPOLAR HIP;  Surgeon: Fuller Canada, MD;  Location: [facility];  Service: Orthopedics;  Laterality: Right;  . INCISION AND DRAINAGE HIP  02/08/2012   Procedure: IRRIGATION AND DEBRIDEMENT HIP;  Surgeon: Vickki Hearing, MD;  Location: [facility];  Service: Orthopedics;  Laterality: Right;  . INCISION AND DRAINAGE OF WOUND  05/24/2011   Procedure: IRRIGATION AND DEBRIDEMENT WOUND;  Surgeon: Fuller Canada, MD;  Location: [facility];  Service: Orthopedics;  Laterality: Right;    Prior to  Admission medications   Medication Sig Start Date End Date Taking? Authorizing Provider  acetaminophen (TYLENOL) 325 MG tablet Take 325-650 mg by mouth every 6 (six) hours as needed for mild pain.    Yes Historical Provider, MD  acidophilus (RISAQUAD) CAPS capsule Take 1 capsule by mouth daily.   Yes Historical Provider, MD  atorvastatin (LIPITOR) 10 MG tablet Take 10 mg by mouth daily.   Yes Historical Provider, MD  buPROPion (WELLBUTRIN XL) 300 MG 24 hr tablet Take 300 mg by mouth daily.   Yes Historical Provider, MD  calcium & magnesium carbonates (MYLANTA) 161-096 MG tablet Take 1 tablet by mouth daily.   Yes Historical Provider, MD  calcium carbonate (CALCIUM 600) 1500 (600 Ca) MG TABS tablet Take by mouth 2 (two) times daily with a meal.   Yes Historical Provider, MD  chlorhexidine (PERIDEX) 0.12 % solution Use as directed 15 mLs in the mouth or throat 2 (two) times daily.   Yes Historical Provider, MD  collagenase (SANTYL) ointment Apply 1 application topically daily. Apply to right lateral foot. 04/22/13  Yes Historical Provider, MD  denosumab (PROLIA) 60 MG/ML SOLN injection Inject 60 mg into the skin every 6 (six) months. Administer in upper arm, thigh, or abdomen   Yes Historical Provider, MD  Dextromethorphan-Guaifenesin (MUCINEX DM) 30-600 MG TB12 Take 2 tablets by mouth 2 (two) times daily as needed.   Yes Historical Provider, MD  dicyclomine (BENTYL) 10 MG capsule Take  1 capsule (10 mg total) by mouth 4 (four) times daily -  before meals and at bedtime. 05/04/11  Yes Vickki Hearing, MD  ferrous sulfate 325 (65 FE) MG tablet Take 325 mg by mouth daily with breakfast.   Yes Historical Provider, MD  gabapentin (NEURONTIN) 300 MG capsule Take 300 mg by mouth 3 (three) times daily.   Yes Historical Provider, MD  ibuprofen (ADVIL,MOTRIN) 200 MG tablet Take 600 mg by mouth every 8 (eight) hours as needed.   Yes Historical Provider, MD  levothyroxine (SYNTHROID, LEVOTHROID) 50 MCG tablet Take  50 mcg by mouth daily before breakfast.    Yes Historical Provider, MD  loperamide (IMODIUM) 2 MG capsule Take 2-4 mg by mouth 4 (four) times daily as needed. For diarrhea, take 2 tablets ( ) initially then take 2 mg after each loose stool   Yes Historical Provider, MD  LORazepam (ATIVAN) 0.5 MG tablet Take 0.5 mg by mouth at bedtime.    Yes Historical Provider, MD  LORazepam (ATIVAN) 1 MG tablet Take 1 mg by mouth 2 (two) times daily.   Yes Historical Provider, MD  mirtazapine (REMERON) 45 MG tablet Take 45 mg by mouth every evening.   Yes Historical Provider, MD  morphine (MS CONTIN) 30 MG 12 hr tablet Take 1 tablet (30 mg total) by mouth every 12 (twelve) hours. 05/21/16  Yes Avon Gully, MD  OLANZapine (ZYPREXA) 15 MG tablet Take 15 mg by mouth at bedtime.   Yes Historical Provider, MD  omeprazole (PRILOSEC) 20 MG capsule Take 20 mg by mouth daily.   Yes Historical Provider, MD  oxybutynin (DITROPAN-XL) 5 MG 24 hr tablet Take 5 mg by mouth at bedtime.   Yes Historical Provider, MD  Oxycodone HCl 10 MG TABS Take 1 tablet (10 mg total) by mouth every 6 (six) hours as needed (moderate to severe pain). 05/21/16  Yes Avon Gully, MD  polyvinyl alcohol (LIQUIFILM TEARS) 1.4 % ophthalmic solution Place 1 drop into both eyes as needed for dry eyes.   Yes Historical Provider, MD  Rivaroxaban (XARELTO) 15 MG TABS tablet Take 15 mg by mouth 2 (two) times daily with a meal.   Yes Historical Provider, MD  senna-docusate (SENNA PLUS) 8.6-50 MG per tablet Take 2 tablets by mouth 2 (two) times daily.   Yes Historical Provider, MD  vitamin C (ASCORBIC ACID) 500 MG tablet Take 500 mg by mouth daily.   Yes Historical Provider, MD  Vitamin D, Ergocalciferol, (DRISDOL) 50000 units CAPS capsule Take 50,000 Units by mouth every 30 (thirty) days. Given on the 22nd of every month   Yes Historical Provider, MD  amoxicillin-clavulanate (AUGMENTIN) 500-125 MG tablet Take 1 tablet (500 mg total) by mouth 3 (three) times  daily. Patient not taking: Reported on 07/09/2016 05/21/16   Avon Gully, MD  predniSONE (STERAPRED UNI-PAK 21 TAB) 10 MG (21) TBPK tablet 4 tab po daily for 3 days, 3 tab po daily for 3 days, 2 tab po daily for 3 days, 1 tab po daily for 3 days. Patient not taking: Reported on 07/09/2016 05/21/16   Avon Gully, MD    Allergies as of 07/09/2016 - Review Complete 07/09/2016  Allergen Reaction Noted  . Diphenhydramine Other (See Comments) 04/30/2011  . Tape Dermatitis 11/20/2013    No family history on file.  Social History   Social History  . Marital status: Single    Spouse name: N/A  . Number of children: N/A  . Years of education: N/A  Occupational History  . Not on file.   Social History Main Topics  . Smoking status: Former Games developer  . Smokeless tobacco: Never Used  . Alcohol use No  . Drug use: No  . Sexual activity: Not on file   Other Topics Concern  . Not on file   Social History Narrative  . No narrative on file    Review of Systems: See HPI, otherwise negative ROS  Physical Exam: BP 99/64   Pulse 71   Temp 98.1 F (36.7 C) (Oral)   Resp 17   Ht  (1.702 m)   Wt 126 lb (57.2 kg)   SpO2 99%   BMI 19.73 kg/m   General:   Alert,  pleasant and cooperative in NAD. Lungs:  Clear throughout to auscultation.   No wheezes, crackles, or rhonchi. No acute distress. Heart:  Regular rate and rhythm; no murmurs, clicks, rubs,  or gallops. Abdomen: Non-distended, normal bowel sounds.  Soft and nontender without appreciable mass or hepatosplenomegaly.  Pulses:  Normal pulses noted. Extremities:  Without clubbing or edema.  Impression:  Pleasant 71 year old lady nursing home resident presents with signs and symptoms consistent with esophageal food impaction. History of GERD. History of somewhat chronic esophageal dysphagia.   She desires urgent intervention via EGD. She is on multiple medications including Xarelto.  Recommendations:    I have offered the  patient an urgent EGD with esophageal disimpaction as feasible/appropriate.  Patient understands I will not attempt dilation today in the urgent setting particularly in view of the fact she is on anticoagulation therapy and her upper GI tract may not be empty. The risks, benefits, limitations, alternatives and imponderables have been reviewed with the patient. Potential for esophageal dilation, biopsy, etc. have also been reviewed.  Questions have been answered. All parties agreeable. I feel patient understands the situation this afternoon. Furthermore, I feel she understands the risks, benefits and limitations. I feel she is competent to provide her own consent for this procedure.  Also, spoke to patient guarding, Zebedee Iba, explained the procedure to him and he is given telephone permission for [place] to carry it out.  Further recommendations to follow.        Notice: This dictation was prepared with Dragon dictation along with smaller phrase technology. Any transcriptional errors that result from this process are unintentional and may not be corrected upon review.

## 2016-07-09 NOTE — ED Provider Notes (Signed)
Emergency Department Provider Note   I have reviewed the triage vital signs and the nursing notes.   HISTORY  Chief Complaint Choking   HPI [name] is a 71 y.o. female with PMH of GERD, anxiety/depression, IBS, and gout presents to the emergency department for evaluation of possible food impaction. The patient states she was eating a piece of [place] steak when she felt like he got stuck in her throat. She had no difficulty breathing. She did have some choking initially and then by her report had spitting up with trying to drink water.  A staff member from the facility called to report that after the incident they had their speech therapist work with the patient but felt like they were unable to clear any obstruction. They cannot recall if they gave her water or not to drink. She's not had this problem in the past. She is typically on a normal diet with thin liquids. She denies any chest pain or difficulty breathing currently. No vomiting.    Past Medical History:  Diagnosis Date  . Anemia   . Anemia   . Anxiety and depression   . Bacteremia   . Cellulitis   . Chronic back pain   . Chronic diarrhea   . Chronic right hip pain   . Complication of anesthesia   . Depression   . GERD (gastroesophageal reflux disease)   . Gout   . Gout   . Hypopotassemia   . IBS (irritable bowel syndrome)   . Muscle weakness (generalized)   . Osteoporosis   . PONV (postoperative nausea and vomiting)   . Pressure ulcer of buttock   . Psychosis     Patient Active Problem List   Diagnosis Date Noted  . Healthcare-associated pneumonia 05/16/2016  . Acute respiratory failure with hypoxia (HCC) 05/16/2016  . Acute encephalopathy 05/16/2016  . Hypothyroidism 05/16/2016  . Influenza 05/16/2016  . Memory deficits 05/16/2016  . 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 Mechanical complication of internal orthopedic implant (HCC) 02/28/2012  . Cellulitis of right hip 01/13/2012  . Psychosis  01/13/2012  . Depression 01/13/2012  . Gout 01/13/2012  . Chronic pain 01/13/2012  . Decubitus ulcer of left buttock, stage 4 (HCC) 01/13/2012  . IBS (irritable bowel syndrome) 01/13/2012  . Hypokalemia 01/13/2012  . Anemia, chronic disease 01/13/2012  . Herniated disc 12/05/2011  . Seroma complicating a procedure 06/05/2011  . 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 Wound infection after surgery 05/22/2011  . Fall 04/30/2011  . Hip fracture, right (HCC) 04/30/2011  . CLOSED FRACTURE OF HEAD OF RADIUS 01/09/2010  . SUBLUXATION-RADIAL HEAD 01/09/2010    Past Surgical History:  Procedure Laterality Date  . HIP ARTHROPLASTY  05/01/2011   Procedure: right ARTHROPLASTY BIPOLAR HIP;  Surgeon: Fuller Canada, MD;  Location: [facility];  Service: Orthopedics;  Laterality: Right;  . INCISION AND DRAINAGE HIP  02/08/2012   Procedure: IRRIGATION AND DEBRIDEMENT HIP;  Surgeon: Vickki Hearing, MD;  Location: [facility];  Service: Orthopedics;  Laterality: Right;  . INCISION AND DRAINAGE OF WOUND  05/24/2011   Procedure: IRRIGATION AND DEBRIDEMENT WOUND;  Surgeon: Fuller Canada, MD;  Location: [facility];  Service: Orthopedics;  Laterality: Right;    Current Outpatient Rx  . Order #: [id number] Class: Historical Med  . Order #: [id number] Class: Historical Med  . Order #: [id number] Class: Historical Med  . Order #: [id number] Class: Historical Med  . Order #: [id number] Class: Historical Med  .  Order #: [id number] Class: Historical Med  . Order #: [id number] Class: Historical Med  . Order #: [id number] Class: Historical Med  . Order #: [id number] Class: Historical Med  . Order #: [id number] Class: Historical Med  . Order #: [id number] Class: Print  . Order #: [id number] Class: Historical Med  . Order #: [id number] Class: Historical Med  . Order #: [id number] Class: Historical Med  . Order #: [id number] Class: Historical Med  . Order #: [id number] Class: Historical Med  .  Order #: [id number] Class: Historical Med  . Order #: [id number] Class: Historical Med  . Order #: [id number] Class: Historical Med  . Order #: [id number] Class: Print  . Order #: [id number] Class: Historical Med  . Order #: [id number] Class: Historical Med  . Order #: [id number] Class: Historical Med  . Order #: [id number] Class: Print  . Order #: [id number] Class: Historical Med  . Order #: [id number] Class: Historical Med  . Order #: [id number] Class: Historical Med  . Order #: [id number] Class: Historical Med  . Order #: [id number] Class: Historical Med  . Order #: [id number] Class: Normal  . Order #: [id number] Class: Normal    Allergies Diphenhydramine and Tape  No family history on file.  Social History Social History  Substance Use Topics  . Smoking status: Former Games developer  . Smokeless tobacco: Never Used  . Alcohol use No    Review of Systems  Constitutional: No fever/chills Eyes: No visual changes. ENT: Positive sore throat and FB sensation.  Cardiovascular: Denies chest pain. Respiratory: Denies shortness of breath. Gastrointestinal: No abdominal pain.  No nausea, no vomiting.  No diarrhea.  No constipation. Genitourinary: Negative for dysuria. Musculoskeletal: Negative for back pain. Skin: Negative for rash. Neurological: Negative for headaches, focal weakness or numbness.  10-point ROS otherwise negative.  ____________________________________________   PHYSICAL EXAM:  VITAL SIGNS: ED Triage Vitals  Enc Vitals Group     BP 07/09/16 1428 95/64     Pulse Rate 07/09/16 1428 71     Resp 07/09/16 1428 12     Temp 07/09/16 1428 98.1 F (36.7 C)     Temp Source 07/09/16 1428 Oral     SpO2 07/09/16 1428 98 %     Weight 07/09/16 1421 126 lb (57.2 kg)     Height 07/09/16 1421  (1.702 m)     Pain Score 07/09/16 1421 3   Constitutional: Alert and oriented. Well appearing and in no acute distress. Eyes: Conjunctivae are normal.  Head: Atraumatic. Nose: No  congestion/rhinnorhea. Mouth/Throat: Mucous membranes are moist.  Oropharynx non-erythematous. Managing oral secretions and speaking in a normal tone of voice.  Neck: No stridor.   Cardiovascular: Normal rate, regular rhythm. Good peripheral circulation. Grossly normal heart sounds.   Respiratory: Normal respiratory effort.  No retractions. Lungs CTAB. Gastrointestinal: Soft and nontender. No distention.  Musculoskeletal: No lower extremity tenderness nor edema. No gross deformities of extremities. Neurologic:  Normal speech and language. No gross focal neurologic deficits are appreciated.  Skin:  Skin is warm, dry and intact. No rash noted.  ____________________________________________   LABS (all labs ordered are listed, but only abnormal results are displayed)  Labs Reviewed  COMPREHENSIVE METABOLIC PANEL - Abnormal; Notable for the following:       Result Value   Calcium 8.5 (*)    Total Protein 5.4 (*)    Albumin 2.8 (*)    AST 14 (*)    ALT 7 (*)    Total Bilirubin 0.1 (*)    All other components within normal limits  CBC WITH DIFFERENTIAL/PLATELET - Abnormal; Notable for the following:  Hemoglobin 11.1 (*)    RDW 16.2 (*)    All other components within normal limits  TROPONIN I  LACTIC ACID, PLASMA  LACTIC ACID, PLASMA   ____________________________________________  EKG  Rate: 75 PR: 161 QTc: 435 Sinus rhythm. Normal axis. Narrow QRS. No ST elevation or depression. No STEMI. Normal axis.  ____________________________________________  RADIOLOGY  Dg Chest 2 View  Result Date: 07/09/2016 CLINICAL DATA:  Choking. EXAM: CHEST  2 VIEW COMPARISON:  05/19/2016 . FINDINGS: Mediastinum hilar structures normal. Stable cardiomegaly. No pulmonary venous congestion. Interim clearing of right base infiltrate. No pleural effusion or pneumothorax. Sliding hiatal hernia. Diffuse osteopenia and degenerative change. IMPRESSION: 1.  Interim clearing of right base pulmonary  infiltrate. 2.  Stable cardiomegaly. 3.  Sliding hiatal hernia. Electronically Signed   By: Maisie Fus  Register   On: 07/09/2016 15:02    ____________________________________________   PROCEDURES  Procedure(s) performed:   Procedures  None ____________________________________________   INITIAL IMPRESSION / ASSESSMENT AND PLAN / ED COURSE  Pertinent labs & imaging results that were available during my care of the patient were reviewed by me and considered in my medical decision making (see chart for details).  Patient presents to the emergency room in for evaluation of possible food impaction after eating steak at lunch. No history of prior in the past. Patient is managing her oral secretions and speaking in normal tone of voice. There was some choking reported during the initial incident. Plan to obtain chest x-ray to evaluate for possible aspiration. We will provide water for the patient to drink and reassess afterwards.   02:40 PM Patient able to drink water with with some mild coughing but no emesis or drooling.   03:40 PM Spoke with Dr. Benard Rink with GI. Plan for upper endoscopy to r/o food bolus. Interval low BP obtained of unclear etiology. Patient with resolved PNA on CXR. Will obtain labs and give IVF.   06:22 PM Patient returned from endoscopy. Appreciate GI assistance of case. By report food bolus was removed. Patient blood pressure has returned to normal. Some concern that a symptomatically hypertension could've been caused by vagal stimulation with food impaction. Patient with no other clear reason for hypotension and this seems to have resolved. Plan for discharge back to nursing facility. Provided discharge instruction information for staff at nursing facility.   At this time, I do not feel there is any life-threatening condition present. I have reviewed and discussed all results (EKG, imaging, lab, urine as appropriate), exam findings with patient. I have reviewed nursing  notes and appropriate previous records.  I feel the patient is safe to be discharged home without further emergent workup. Discussed usual and customary return precautions. Patient and family (if present) verbalize understanding and are comfortable with this plan.  Patient will follow-up with their primary care provider. If they do not have a primary care provider, information for follow-up has been provided to them. All questions have been answered.  ____________________________________________  FINAL CLINICAL IMPRESSION(S) / ED DIAGNOSES  Final diagnoses:  Food impaction of esophagus, initial encounter     MEDICATIONS GIVEN DURING THIS VISIT:  Medications  lidocaine (XYLOCAINE) 2 % viscous mouth solution (not administered)  lidocaine (XYLOCAINE) 2 % viscous mouth solution (not administered)  0.9 %  sodium chloride infusion (250 mLs Injection New Bag/Given 07/09/16 1720)  sodium chloride 0.9 % bolus 500 mL (0 mLs Intravenous Stopped 07/09/16 1825)     NEW OUTPATIENT MEDICATIONS STARTED DURING THIS VISIT:  None   Note:  This document was prepared using Dragon voice recognition software and may include unintentional dictation errors.  Alona Bene, MD Emergency Medicine   Maia Plan, MD 07/09/16 [phone]

## 2016-07-09 NOTE — ED Notes (Addendum)
Pt back from Endo at 1805. nad

## 2016-07-09 NOTE — ED Notes (Signed)
p ttaken to ENDO

## 2016-07-09 NOTE — ED Triage Notes (Signed)
Presents from [employer], while eating lunch pt became choked and had food lodged in throat, she was unable to drink water without it coming out of mouth. She still feels like something is in her throat-handling secretion, airway intact. No prior hx of dysphagia.

## 2016-07-10 ENCOUNTER — Other Ambulatory Visit: Payer: Self-pay

## 2016-07-10 ENCOUNTER — Telehealth: Payer: Self-pay

## 2016-07-10 DIAGNOSIS — R1310 Dysphagia, unspecified: Secondary | ICD-10-CM

## 2016-07-10 NOTE — Telephone Encounter (Signed)
[name] at [facility] called office to schedule Barium Pill Esophagram (RMR done EGD yesterday). BPE scheduled for 07/13/16 at 9:30am, pt to arrive at 9:15am. NPO 3 hours prior to test. Carlus Pavlov back and informed of appt and instructions.

## 2016-07-13 ENCOUNTER — Other Ambulatory Visit: Payer: Self-pay | Admitting: Internal Medicine

## 2016-07-13 ENCOUNTER — Ambulatory Visit (HOSPITAL_COMMUNITY)
Admission: RE | Admit: 2016-07-13 | Discharge: 2016-07-13 | Disposition: A | Discharge status: Home / Self Care | Payer: Medicare Other | Source: Ambulatory Visit | Attending: Internal Medicine | Admitting: Internal Medicine

## 2016-07-13 ENCOUNTER — Ambulatory Visit (HOSPITAL_COMMUNITY): Admission: RE | Admit: 2016-07-13 | Payer: Medicare Other | Source: Ambulatory Visit

## 2016-07-13 DIAGNOSIS — R1310 Dysphagia, unspecified: Secondary | ICD-10-CM | POA: Diagnosis present

## 2016-07-16 ENCOUNTER — Encounter: Payer: Self-pay | Admitting: Internal Medicine

## 2016-07-16 NOTE — Progress Notes (Signed)
APPOINTMENT MADE AND LETTER SENT °

## 2016-07-27 ENCOUNTER — Encounter (HOSPITAL_COMMUNITY): Payer: Self-pay | Admitting: Internal Medicine

## 2016-08-06 ENCOUNTER — Encounter: Payer: Self-pay | Admitting: Gastroenterology

## 2016-08-06 ENCOUNTER — Ambulatory Visit (INDEPENDENT_AMBULATORY_CARE_PROVIDER_SITE_OTHER): Payer: Medicare Other | Admitting: Gastroenterology

## 2016-08-06 ENCOUNTER — Telehealth: Payer: Self-pay

## 2016-08-06 DIAGNOSIS — T18128A Food in esophagus causing other injury, initial encounter: Secondary | ICD-10-CM | POA: Insufficient documentation

## 2016-08-06 DIAGNOSIS — T18128D Food in esophagus causing other injury, subsequent encounter: Secondary | ICD-10-CM

## 2016-08-06 NOTE — Progress Notes (Signed)
Primary Care Physician:  Bernerd Limbo, MD  Primary Gastroenterologist:  Roetta Sessions, MD   Chief Complaint  Patient presents with  . Dysphagia    HPI:  [name] is a 71 y.o. female with a history of depression, psychosis, memory deficits who presents from the nursing home for follow-up of food impaction back in April. Presented to the ED after [facility] steak became lodged. Patient reported intermittent esophageal dysphagia. 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 Therefore when she presented to the ED anticoagulated she underwent an urgent EGD with disimpaction but no attempts for dilation in the setting of anticoagulation and likely food in the stomach. BPE was ordered to further evaluate her dysphagia but patient was unable to swallow the barium afterMultiple attempts therefore the test cannot be done.   Patient consumes regular diet mechanical soft texture, thin consistency. Patient is unable to provide reliable history. She denies any issue swallowing. We contacted the patient's nurse, Marcial Pacas. Mrs. [name] per force at the patient was initially hospitalized back in February with pneumonia. She had the flu and then subsequently developed a DVT. She was put on Xarelto at that time. Her appetite has declined over the course of those few months. Notable weight loss of 11 pounds since November. Weight loss of 22 pounds in the past one year. There's been no melena rectal bleeding. Patient has had at least 2 episodes of something getting caught in her throat, after coughing for a few minutes the food comes back up.  We received speech therapy notes from back in April when patient initially started having "choking during meals". Patient was observed by the speech therapist during an episode where the patient was eating and felt like the food was lodged in the chest. She was able to talk and breathe. Felt likely esophageal dysphagia/stricture.  Current Outpatient  Prescriptions  Medication Sig Dispense Refill  . acetaminophen (TYLENOL) 325 MG tablet Take 325-650 mg by mouth every 6 (six) hours as needed for mild pain.     Marland Kitchen acidophilus (RISAQUAD) CAPS capsule Take 1 capsule by mouth daily.    Marland Kitchen atorvastatin (LIPITOR) 10 MG tablet Take 10 mg by mouth daily.    Marland Kitchen buPROPion (WELLBUTRIN XL) 300 MG 24 hr tablet Take 300 mg by mouth daily.    . calcium & magnesium carbonates (MYLANTA) 409-811 MG tablet Take 1 tablet by mouth daily.    . calcium carbonate (CALCIUM 600) 1500 (600 Ca) MG TABS tablet Take by mouth 2 (two) times daily with a meal.    . chlorhexidine (PERIDEX) 0.12 % solution Use as directed 15 mLs in the mouth or throat 2 (two) times daily.    . collagenase (SANTYL) ointment Apply 1 application topically daily. Apply to right lateral foot.    Marland Kitchen denosumab (PROLIA) 60 MG/ML SOLN injection Inject 60 mg into the skin every 6 (six) months. Administer in upper arm, thigh, or abdomen    . Dextromethorphan-Guaifenesin (MUCINEX DM) 30-600 MG TB12 Take 2 tablets by mouth 2 (two) times daily as needed.    . dicyclomine (BENTYL) 10 MG capsule Take 1 capsule (10 mg total) by mouth 4 (four) times daily -  before meals and at bedtime. 40 capsule 0  . ferrous sulfate 325 (65 FE) MG tablet Take 325 mg by mouth daily with breakfast.    . gabapentin (NEURONTIN) 300 MG capsule Take 300 mg by mouth 3 (three) times daily.    Marland Kitchen ibuprofen (ADVIL,MOTRIN) 200 MG tablet Take  600 mg by mouth every 8 (eight) hours as needed.    Marland Kitchen. levothyroxine (SYNTHROID, LEVOTHROID) 50 MCG tablet Take 50 mcg by mouth daily before breakfast.     . loperamide (IMODIUM) 2 MG capsule Take 2-4 mg by mouth 4 (four) times daily as needed. For diarrhea, take 2 tablets (4mg ) initially then take 2 mg after each loose stool    . LORazepam (ATIVAN) 0.5 MG tablet Take 0.5 mg by mouth at bedtime.     Marland Kitchen. LORazepam (ATIVAN) 1 MG tablet Take 1 mg by mouth 2 (two) times daily.    . mirtazapine (REMERON) 45 MG  tablet Take 45 mg by mouth every evening.    Marland Kitchen. morphine (MS CONTIN) 30 MG 12 hr tablet Take 1 tablet (30 mg total) by mouth every 12 (twelve) hours. 10 tablet 0  . OLANZapine (ZYPREXA) 15 MG tablet Take 15 mg by mouth at bedtime.    Marland Kitchen. omeprazole (PRILOSEC) 20 MG capsule Take 20 mg by mouth daily.    Marland Kitchen. oxybutynin (DITROPAN-XL) 5 MG 24 hr tablet Take 5 mg by mouth at bedtime.    . Oxycodone HCl 10 MG TABS Take 1 tablet (10 mg total) by mouth every 6 (six) hours as needed (moderate to severe pain). 10 tablet 0  . polyvinyl alcohol (LIQUIFILM TEARS) 1.4 % ophthalmic solution Place 1 drop into both eyes as needed for dry eyes.    . rivaroxaban (XARELTO) 20 MG TABS tablet Take 20 mg by mouth daily with supper.    . senna-docusate (SENNA PLUS) 8.6-50 MG per tablet Take 2 tablets by mouth 2 (two) times daily.    . vitamin C (ASCORBIC ACID) 500 MG tablet Take 500 mg by mouth daily.    . Vitamin D, Ergocalciferol, (DRISDOL) 50000 units CAPS capsule Take 50,000 Units by mouth every 30 (thirty) days. Given on the 22nd of every month     No current facility-administered medications for this visit.     Allergies as of 08/06/2016 - Review Complete 07/09/2016  Allergen Reaction Noted  . Diphenhydramine Other (See Comments) 04/30/2011  . Tape Dermatitis 11/20/2013    Past Medical History:  Diagnosis Date  . Anemia   . Anemia   . Anxiety and depression   . Bacteremia   . Cellulitis   . Chronic back pain   . Chronic diarrhea   . Chronic right hip pain   . Complication of anesthesia   . Depression   . DVT (deep venous thrombosis) (HCC)   . GERD (gastroesophageal reflux disease)   . Gout   . Gout   . Hypopotassemia   . Hypothyroidism   . IBS (irritable bowel syndrome)   . Muscle weakness (generalized)   . Osteoporosis   . PONV (postoperative nausea and vomiting)   . Pressure ulcer of buttock   . Psychosis     Past Surgical History:  Procedure Laterality Date  . ESOPHAGOGASTRODUODENOSCOPY  N/A 07/09/2016   Procedure: ESOPHAGOGASTRODUODENOSCOPY (EGD);  Surgeon: Corbin Adeobert M Rourk, MD;  Location: [facility];  Service: Endoscopy;  Laterality: N/A;  . HIP ARTHROPLASTY  05/01/2011   Procedure: right ARTHROPLASTY BIPOLAR HIP;  Surgeon: Fuller CanadaStanley Harrison, MD;  Location: [facility];  Service: Orthopedics;  Laterality: Right;  . INCISION AND DRAINAGE HIP  02/08/2012   Procedure: IRRIGATION AND DEBRIDEMENT HIP;  Surgeon: Vickki HearingStanley E Harrison, MD;  Location: [facility];  Service: Orthopedics;  Laterality: Right;  . INCISION AND DRAINAGE OF WOUND  05/24/2011   Procedure: IRRIGATION AND  DEBRIDEMENT WOUND;  Surgeon: Fuller Canada, MD;  Location: [facility];  Service: Orthopedics;  Laterality: Right;    No family history on file. Unknown.   Social History   Social History  . Marital status: Single    Spouse name: N/A  . Number of children: N/A  . Years of education: N/A   Occupational History  . Not on file.   Social History Main Topics  . Smoking status: Former Games developer  . Smokeless tobacco: Never Used  . Alcohol use No  . Drug use: No  . Sexual activity: Not on file   Other Topics Concern  . Not on file   Social History Narrative  . No narrative on file      ROS: patient's history unreliable. She denies any symptoms or dysphagia.       Physical Examination:  BP (!) 100/58   Pulse 66   Temp 98.3 F (36.8 C) (Oral)   Ht 5\' 3"  (1.6 m)   Wt 116 lb (52.6 kg)   BMI 20.55 kg/m    General: Frail appearing female in no acute distress.  Head: Normocephalic, atraumatic.   Eyes: Conjunctiva pink, no icterus. Mouth: Oropharyngeal mucosa moist and pink , no lesions erythema or exudate. Neck: Supple without thyromegaly, masses, or lymphadenopathy.  Lungs: Clear to auscultation bilaterally.  Heart: Regular rate and rhythm, no murmurs rubs or gallops.  Abdomen: Bowel sounds are normal, nontender, nondistended, no hepatosplenomegaly or masses, no abdominal bruits or    hernia , no rebound or  guarding.   Rectal: not performed Extremities: No lower extremity edema. No clubbing or deformities.  Neuro: Alert and oriented x 4 , grossly normal neurologically.  Skin: Warm and dry, no rash or jaundice.   Psych: Alert and cooperative, normal mood and affect.  Labs: Lab Results  Component Value Date   CREATININE 0.64 07/09/2016   BUN 19 07/09/2016   NA 139 07/09/2016   K 3.9 07/09/2016   CL 105 07/09/2016   CO2 28 07/09/2016   Lab Results  Component Value Date   WBC 5.0 07/09/2016   HGB 11.1 (L) 07/09/2016   HCT 36.3 07/09/2016   MCV 86.8 07/09/2016   PLT 307 07/09/2016   Lab Results  Component Value Date   ALT 7 (L) 07/09/2016   AST 14 (L) 07/09/2016   ALKPHOS 83 07/09/2016   BILITOT 0.1 (L) 07/09/2016     Imaging Studies: Dg Chest 2 View  Result Date: 07/09/2016 CLINICAL DATA:  Choking. EXAM: CHEST  2 VIEW COMPARISON:  05/19/2016 . FINDINGS: Mediastinum hilar structures normal. Stable cardiomegaly. No pulmonary venous congestion. Interim clearing of right base infiltrate. No pleural effusion or pneumothorax. Sliding hiatal hernia. Diffuse osteopenia and degenerative change. IMPRESSION: 1.  Interim clearing of right base pulmonary infiltrate. 2.  Stable cardiomegaly. 3.  Sliding hiatal hernia. Electronically Signed   By: Maisie Fus  Register   On: 07/09/2016 15:02   Dg Fluoro Rm 1-60 Min  Result Date: 07/13/2016 CLINICAL DATA:  Dysphagia. EXAM: FLOURO RM 1-60 MIN FLUOROSCOPY TIME:  Fluoroscopy Time:  1 minutes and 30 seconds Number of Acquired Spot Images: 0 COMPARISON:  None. FINDINGS: Multiple attempts were made for patient to swallow contrast. Despite her reported attempts, contrast was not swallowed. Therefore, exam was terminated. IMPRESSION: Unsuccessful esophagram, as above Electronically Signed   By: Jeronimo Greaves M.D.   On: 07/13/2016 15:53

## 2016-08-06 NOTE — Telephone Encounter (Signed)
Called [facility], spoke with [name], pts nurse, she said the pt was hospitalized d/t pneumonia and the flu and then had a DVT. She said pt was very independent prior to this. Her appetite was not good during this time. Her weight in November was 123 and in April it was 116. No blood in her stool reported by staff or in the nursing notes. Pt has had 2 more episodes of something getting caught in her throat and she coughs for a minute and then the food comes back up since she was sent to the hospital on 07/09/16. Barium swallow was not done d/t the pt would not swallow the barium.   [name] is going to fax the weights and the speech therapy notes to us.

## 2016-08-06 NOTE — Patient Instructions (Signed)
1. Please fax copy of speech therapy records from the past 6 months to [phone]. 2. Please fax copy of weights from the last 6 months to [phone].

## 2016-08-08 ENCOUNTER — Encounter: Payer: Self-pay | Admitting: Gastroenterology

## 2016-08-08 NOTE — Assessment & Plan Note (Signed)
71 year old female with food impaction in April 2018 who underwent emergent EGD by Dr. Benard Rinkoark for disimpaction. No attempts of dilation in the setting of anticoagulation. Patient is on Xarelto for history of DVT which apparently occurred sometime between her hospitalization in February and when she presented to the ED in April. Reviewed records from speech therapy and patient's primary nurse reports a couple of episodes of near food impaction with patient coughing and food coming back up. Patient's history is unreliable. Patient was unable to perform barium esophagram, after multiple tries they could not get her to swallow the barium. She continues to lose weight. 11 pounds since November. 22 pounds in the past year. Suspect at least some part due to esophageal dysphagia.  Offer EGD with dilation off Xarelto (36-48 hours given high risk procedure) in the near future. We will need to obtain consent from patient's legal guardian Mr. Zebedee IbaRon Johnson, phone number [phone].

## 2016-08-08 NOTE — Progress Notes (Signed)
Please schedule EGD with dilation with Dr. Jena Gaussourk. Phenergan 12.5 mg IV 45 minutes before the procedure for polypharmacy. Diagnosis esophageal dysphagia, weight loss.  Please consent patient, she has a legal guardian Mr. Zebedee IbaRon Johnson, [phone]. Discussed the following risk: risk of reaction to medication, bleeding, infection, perforation, risk of blood clot off anticoagulation for the procedure. Alternatives, none as patient was unable to complete barium x-ray. Benefits, improved swallowing function if esophageal stricture treated.   Hold xarelto 36-48 hours, no more than 48 hours. Patient takes in the evening at supper. Speak with me if any questions.

## 2016-08-08 NOTE — Progress Notes (Signed)
CC'ED TO PCP 

## 2016-08-08 NOTE — Telephone Encounter (Signed)
Noted. Faxed information received. Office note updated.

## 2016-08-09 ENCOUNTER — Other Ambulatory Visit: Payer: Self-pay

## 2016-08-09 DIAGNOSIS — R1310 Dysphagia, unspecified: Secondary | ICD-10-CM

## 2016-08-09 DIAGNOSIS — R634 Abnormal weight loss: Secondary | ICD-10-CM

## 2016-08-09 DIAGNOSIS — R1319 Other dysphagia: Secondary | ICD-10-CM

## 2016-08-09 NOTE — Progress Notes (Signed)
Called [facility] and spoke to The AcreageEvelyn. EGD/Dil with RMR scheduled for 09/13/16 at 1:00pm. [name] advised to fax instructions to [phone] and she would give them to pt's nurse. Instructions were faxed (holding Xarelto noted on instructions). Tried to call legal guardian [name], no answer, left detailed message on his voicemail about procedure, date, time, and place. Advised him that he would need to give consent for pt and to call [phone] to give consent on the phone if he would be unable to be at the hospital the day of her procedure.

## 2016-09-13 ENCOUNTER — Encounter (HOSPITAL_COMMUNITY): Payer: Self-pay | Admitting: *Deleted

## 2016-09-13 ENCOUNTER — Encounter (HOSPITAL_COMMUNITY)
Admission: RE | Disposition: A | Discharge status: Skilled Nursing Facility | Payer: Self-pay | Source: Ambulatory Visit | Attending: Internal Medicine

## 2016-09-13 ENCOUNTER — Telehealth: Payer: Self-pay | Admitting: General Practice

## 2016-09-13 ENCOUNTER — Ambulatory Visit (HOSPITAL_COMMUNITY)
Admission: RE | Admit: 2016-09-13 | Discharge: 2016-09-13 | Disposition: A | Discharge status: Skilled Nursing Facility | Payer: Medicare Other | Source: Ambulatory Visit | Attending: Internal Medicine | Admitting: Internal Medicine

## 2016-09-13 ENCOUNTER — Encounter: Payer: Self-pay | Admitting: Nurse Practitioner

## 2016-09-13 DIAGNOSIS — R634 Abnormal weight loss: Secondary | ICD-10-CM | POA: Insufficient documentation

## 2016-09-13 DIAGNOSIS — Z538 Procedure and treatment not carried out for other reasons: Secondary | ICD-10-CM | POA: Diagnosis not present

## 2016-09-13 DIAGNOSIS — R1310 Dysphagia, unspecified: Secondary | ICD-10-CM | POA: Diagnosis not present

## 2016-09-13 SURGERY — EGD (ESOPHAGOGASTRODUODENOSCOPY)
Anesthesia: Moderate Sedation

## 2016-09-13 MED ORDER — MIDAZOLAM HCL 5 MG/5ML IJ SOLN
INTRAMUSCULAR | Status: DC
Start: 2016-09-13 — End: 2016-09-13
  Filled 2016-09-13: qty 10

## 2016-09-13 MED ORDER — MEPERIDINE HCL 100 MG/ML IJ SOLN
INTRAMUSCULAR | Status: AC
  Filled 2016-09-13: qty 2

## 2016-09-13 MED ORDER — ONDANSETRON HCL 4 MG/2ML IJ SOLN
INTRAMUSCULAR | Status: AC
  Filled 2016-09-13: qty 2

## 2016-09-13 MED ORDER — LIDOCAINE VISCOUS 2 % MT SOLN
OROMUCOSAL | Status: AC
  Filled 2016-09-13: qty 15

## 2016-09-13 NOTE — Telephone Encounter (Signed)
Per the nursing home, the patient had breakfast(eggs, bacon, and pancakes) today, so her procedure will be cancelled.  Please schedule the patient for a repeat office visit to reschedule her procedure.  Routing to [employer] CorporationStacey

## 2016-09-13 NOTE — OR Nursing (Signed)
Patient scheduled for an EGD/ED. Patient stated she ate pancakes and eggs for breakfast. [facility] who said she "did not know because she was not on that hall this morning." Dr. Jena Gaussourk notified and procedure will be cancelled today since NPO status cannot be confirmed.

## 2016-09-13 NOTE — Telephone Encounter (Signed)
Appointment made and care facility called with date and time of appointment

## 2016-11-06 ENCOUNTER — Encounter (HOSPITAL_COMMUNITY): Payer: Self-pay | Admitting: *Deleted

## 2016-11-06 ENCOUNTER — Emergency Department (HOSPITAL_COMMUNITY): Payer: Medicare Other

## 2016-11-06 ENCOUNTER — Emergency Department (HOSPITAL_COMMUNITY)
Admission: EM | Admit: 2016-11-06 | Discharge: 2016-11-07 | Disposition: A | Discharge status: Home / Self Care | Payer: Medicare Other | Attending: Emergency Medicine | Admitting: Emergency Medicine

## 2016-11-06 DIAGNOSIS — M545 Low back pain: Secondary | ICD-10-CM | POA: Diagnosis not present

## 2016-11-06 DIAGNOSIS — W19XXXA Unspecified fall, initial encounter: Secondary | ICD-10-CM | POA: Diagnosis not present

## 2016-11-06 DIAGNOSIS — E039 Hypothyroidism, unspecified: Secondary | ICD-10-CM | POA: Diagnosis not present

## 2016-11-06 DIAGNOSIS — R51 Headache: Secondary | ICD-10-CM | POA: Insufficient documentation

## 2016-11-06 DIAGNOSIS — Z7901 Long term (current) use of anticoagulants: Secondary | ICD-10-CM | POA: Insufficient documentation

## 2016-11-06 DIAGNOSIS — M25551 Pain in right hip: Secondary | ICD-10-CM | POA: Diagnosis not present

## 2016-11-06 DIAGNOSIS — Y92129 Unspecified place in nursing home as the place of occurrence of the external cause: Secondary | ICD-10-CM

## 2016-11-06 DIAGNOSIS — Z79899 Other long term (current) drug therapy: Secondary | ICD-10-CM | POA: Diagnosis not present

## 2016-11-06 DIAGNOSIS — R0789 Other chest pain: Secondary | ICD-10-CM | POA: Diagnosis not present

## 2016-11-06 NOTE — ED Triage Notes (Addendum)
Per [facility] and PA from [facility]), pt fell out of her wheelchair hitting her head. Pt c/o head and neck pain. Pt did not have c-collar in place upon arrival to the ED. Pt has head lice that they started treating today. Pt had 30 mg MS Contin pta.

## 2016-11-07 ENCOUNTER — Ambulatory Visit: Payer: Medicare Other | Admitting: Gastroenterology

## 2016-11-07 NOTE — ED Provider Notes (Signed)
[facility] Provider Note   CSN: [id number] Arrival date & time: 11/06/16  2212  Time seen 23:09 PM    History   Chief Complaint Chief Complaint  Patient presents with  . Fall   Level V caveat for memory problems  HPI [name] is a 71 y.o. female.  HPI  Patient states she was sliding down on her bed so she can get up to go the bathroom and she fell. She does not know if she had loss of consciousness. She states she has pain in the back of her head and in her neck. She also complains of new low back pain and new pain in her right hip. She also complains of pain in her left ribs. She states she had some nausea but no vomiting, she denies any visual changes. She denies any new numbness or tingling in her extremities and states she has some chronic numbness in her left hand. Pt is on a blood thinner xarelto.   PCP Bernerd Limbo, MD   Past Medical History:  Diagnosis Date  . Anemia   . Anemia   . Anxiety and depression   . Bacteremia   . Cellulitis   . Chronic back pain   . Chronic diarrhea   . Chronic right hip pain   . Complication of anesthesia   . Depression   . DVT (deep venous thrombosis) (HCC)   . GERD (gastroesophageal reflux disease)   . Gout   . Gout   . Hypopotassemia   . Hypothyroidism   . IBS (irritable bowel syndrome)   . Muscle weakness (generalized)   . Osteoporosis   . PONV (postoperative nausea and vomiting)   . Pressure ulcer of buttock   . Psychosis     Patient Active Problem List   Diagnosis Date Noted  . Food impaction of esophagus 08/06/2016  . Healthcare-associated pneumonia 05/16/2016  . Acute respiratory failure with hypoxia (HCC) 05/16/2016  . Acute encephalopathy 05/16/2016  . Hypothyroidism 05/16/2016  . Influenza 05/16/2016  . Memory deficits 05/16/2016  . 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 Mechanical complication of internal orthopedic implant (HCC) 02/28/2012  . Cellulitis of right hip 01/13/2012  .  Psychosis 01/13/2012  . Depression 01/13/2012  . Gout 01/13/2012  . Chronic pain 01/13/2012  . Decubitus ulcer of left buttock, stage 4 (HCC) 01/13/2012  . IBS (irritable bowel syndrome) 01/13/2012  . Hypokalemia 01/13/2012  . Anemia, chronic disease 01/13/2012  . Herniated disc 12/05/2011  . Seroma complicating a procedure 06/05/2011  . 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 Wound infection after surgery 05/22/2011  . Fall 04/30/2011  . Hip fracture, right (HCC) 04/30/2011  . CLOSED FRACTURE OF HEAD OF RADIUS 01/09/2010  . SUBLUXATION-RADIAL HEAD 01/09/2010    Past Surgical History:  Procedure Laterality Date  . ESOPHAGOGASTRODUODENOSCOPY N/A 07/09/2016   Procedure: ESOPHAGOGASTRODUODENOSCOPY (EGD);  Surgeon: Corbin Ade, MD;  Location: [facility];  Service: Endoscopy;  Laterality: N/A;  . HIP ARTHROPLASTY  05/01/2011   Procedure: right ARTHROPLASTY BIPOLAR HIP;  Surgeon: Fuller Canada, MD;  Location: [facility];  Service: Orthopedics;  Laterality: Right;  . INCISION AND DRAINAGE HIP  02/08/2012   Procedure: IRRIGATION AND DEBRIDEMENT HIP;  Surgeon: Vickki Hearing, MD;  Location: [facility];  Service: Orthopedics;  Laterality: Right;  . INCISION AND DRAINAGE OF WOUND  05/24/2011   Procedure: IRRIGATION AND DEBRIDEMENT WOUND;  Surgeon: Fuller Canada, MD;  Location: [facility];  Service: Orthopedics;  Laterality: Right;    OB History    No data available       Home Medications    Prior to Admission medications   Medication Sig Start Date End Date Taking? Authorizing Provider  acetaminophen (TYLENOL) 325 MG tablet Take 650 mg by mouth every 6 (six) hours as needed for fever.     [provider]  alum & mag hydroxide-simeth (MAALOX/MYLANTA) 200-200-20 MG/5ML suspension Take 30 mLs by mouth every 6 (six) hours as needed for indigestion.    [provider]  atorvastatin (LIPITOR) 10 MG tablet Take 10 mg by mouth  daily at 10 pm.     [provider]  buPROPion (WELLBUTRIN XL) 300 MG 24 hr tablet Take 300 mg by mouth daily.    [provider]  calcium carbonate (CALCIUM 600) 1500 (600 Ca) MG TABS tablet Take 600 mg of elemental calcium by mouth 2 (two) times daily.     [provider]  chlorhexidine (PERIDEX) 0.12 % solution Use as directed 15 mLs in the mouth or throat every 12 (twelve) hours as needed (gum/mouth pain).     [provider]  denosumab (PROLIA) 60 MG/ML SOLN injection Inject 60 mg into the skin every 6 (six) months. Administer in upper arm, thigh, or abdomen    [provider]  Dextromethorphan-Guaifenesin (MUCINEX DM) 30-600 MG TB12 Take 2 tablets by mouth every 12 (twelve) hours as needed (chest congestion).     [provider]  dicyclomine (BENTYL) 10 MG capsule Take 1 capsule (10 mg total) by mouth 4 (four) times daily -  before meals and at bedtime. 05/04/11   Vickki Hearing, MD  ferrous sulfate 325 (65 FE) MG tablet Take 325 mg by mouth daily.     [provider]  gabapentin (NEURONTIN) 400 MG capsule Take 400 mg by mouth every 8 (eight) hours. Hold for sedation or sleep    [provider]  levothyroxine (SYNTHROID, LEVOTHROID) 50 MCG tablet Take 50 mcg by mouth daily before breakfast.     [provider]  loperamide (IMODIUM) 2 MG capsule Take 2 mg by mouth every 6 (six) hours as needed for diarrhea or loose stools.     [provider]  LORazepam (ATIVAN) 0.5 MG tablet Take 0.5 mg by mouth at bedtime.     [provider]  LORazepam (ATIVAN) 1 MG tablet Take 1 mg by mouth 2 (two) times daily.    [provider]  mirtazapine (REMERON) 45 MG tablet Take 45 mg by mouth at bedtime.     [provider]  morphine (MS CONTIN) 30 MG 12 hr tablet Take 1 tablet (30 mg total) by mouth every 12 (twelve) hours. Patient taking differently: Take 30 mg by mouth 2 (two) times daily.   05/21/16   Avon Gully, MD  NUTRITIONAL SUPPLEMENT LIQD Take 120 mLs by mouth 4 (four) times daily.    [provider]  OLANZapine (ZYPREXA) 15 MG tablet Take 15 mg by mouth at bedtime.    [provider]  omeprazole (PRILOSEC) 20 MG capsule Take 20 mg by mouth daily.    [provider]  oxybutynin (DITROPAN-XL) 5 MG 24 hr tablet Take 5 mg by mouth at bedtime.    [provider]  Oxycodone HCl 10 MG TABS Take 1 tablet (10 mg total) by mouth every 6 (six) hours as needed (moderate to severe pain). 05/21/16   Avon Gully, MD  polyvinyl alcohol (  LIQUIFILM TEARS) 1.4 % ophthalmic solution Place 1 drop into both eyes daily as needed for dry eyes.     [provider]  Probiotic CAPS Take 1 capsule by mouth daily.    [provider]  rivaroxaban (XARELTO) 20 MG TABS tablet Take 20 mg by mouth daily.     [provider]  senna-docusate (SENNA PLUS) 8.6-50 MG per tablet Take 2 tablets by mouth 2 (two) times daily.    [provider]  vitamin C (ASCORBIC ACID) 500 MG tablet Take 500 mg by mouth daily.    [provider]  Vitamin D, Ergocalciferol, (DRISDOL) 50000 units CAPS capsule Take 50,000 Units by mouth every 14 (fourteen) days.     [provider]    Family History History reviewed. No pertinent family history.  Social History Social History  Substance Use Topics  . Smoking status: Former Games developer  . Smokeless tobacco: Never Used  . Alcohol use No  lives in a NH   Allergies   Diphenhydramine and Tape   Review of Systems Review of Systems  All other systems reviewed and are negative.    Physical Exam Updated Vital Signs BP (!) 101/57 (BP Location: Right Arm)   Pulse 65   Temp 98.5 F (36.9 C) (Oral)   Resp 19   SpO2 97%   Vital signs normal except borderline low BP   Physical Exam  Constitutional: She is oriented to person, place, and time.  Non-toxic appearance. She does not appear  ill. No distress.  Frail elderly female  HENT:  Head: Normocephalic and atraumatic.  Right Ear: External ear normal.  Left Ear: External ear normal.  Nose: Nose normal. No mucosal edema or rhinorrhea.  Mouth/Throat: Oropharynx is clear and moist and mucous membranes are normal. No dental abscesses or uvula swelling.  Eyes: Pupils are equal, round, and reactive to light. Conjunctivae and EOM are normal.  Neck: Normal range of motion and full passive range of motion without pain. Neck supple.  Patient does not have a c-collar in place. She is noted to move her head freely. However she has tender over the posterior cervical spine. There is no localized tenderness. C-collar was placed.  Cardiovascular: Normal rate, regular rhythm and normal heart sounds.  Exam reveals no gallop and no friction rub.   No murmur heard. Pulmonary/Chest: Effort normal and breath sounds normal. No respiratory distress. She has no wheezes. She has no rhonchi. She has no rales. She exhibits no tenderness and no crepitus.  Patient complains of tenderness when I palpate her left lateral rib cage without crepitance or bruising.  Abdominal: Soft. Normal appearance and bowel sounds are normal. She exhibits no distension. There is no tenderness. There is no rebound and no guarding.  Musculoskeletal: Normal range of motion. She exhibits no edema or tenderness.  Moves all extremities well. Patient is able to flex her knees without difficulty. She however does state it hurts in her right hip. She has tenderness diffusely of her lumbar spine without localization.  Neurological: She is alert and oriented to person, place, and time. She has normal strength. No cranial nerve deficit.  Skin: Skin is warm, dry and intact. No rash noted. No erythema. No pallor.  Psychiatric: She has a normal mood and affect. Her speech is normal and behavior is normal. Her mood appears not anxious.  Nursing note and vitals reviewed.    ED Treatments /  Results  Labs (all labs ordered are listed, but only abnormal  results are displayed) Labs Reviewed - No data to display  EKG  EKG Interpretation None       Radiology Dg Ribs Unilateral W/chest Left  Result Date: 11/07/2016 CLINICAL DATA:  Fall out of bed at nursing home.  Left rib pain. EXAM: LEFT RIBS AND CHEST - 3+ VIEW COMPARISON:  Radiographs 07/09/2016 FINDINGS: Remote fractures of left lateral third, fourth, and fifth ribs, as seen on prior exam. No acute rib fracture. No pulmonary complication such as pneumothorax, pleural effusion or focal airspace disease. Unchanged heart size and mediastinal contours with hiatal hernia. There is tortuosity of the thoracic aorta. IMPRESSION: 1. No acute rib fracture or intrathoracic abnormality. 2. Remote left third through fifth rib fractures. Electronically Signed   By: Rubye Oaks M.D.   On: 11/07/2016 00:57   Dg Lumbar Spine Complete  Result Date: 11/07/2016 CLINICAL DATA:  Lumbosacral back pain after fall out of bed at nursing home. EXAM: LUMBAR SPINE - COMPLETE 4+ VIEW COMPARISON:  Radiographs 03/28/2013 FINDINGS: Straightening of normal lordosis without listhesis. Vertebral body heights are normal. There is no listhesis. The posterior elements are intact. Disc space narrowing and endplate spurring at L3-L4 and L4-L5, progressed from prior radiographs. No fracture. Sacroiliac joints are symmetric and normal. The bones appear under mineralized. IMPRESSION: 1. No acute fracture or subluxation of the lumbar spine. 2. Degenerative disc disease at L3-L4 and L4-L5, progressed from 2015 radiographs. Electronically Signed   By: Rubye Oaks M.D.   On: 11/07/2016 01:02   Ct Head Wo Contrast  Ct Cervical Spine Wo Contrast  Result Date: 11/07/2016 CLINICAL DATA:  Larey Seat out of wheelchair EXAM: CT HEAD WITHOUT CONTRAST CT CERVICAL SPINE WITHOUT CONTRAST TECHNIQUE: Multidetector CT imaging of the head and cervical spine was performed following the  standard protocol without intravenous contrast. Multiplanar CT image reconstructions of the cervical spine were also generated. COMPARISON:  06/08/2016, 06/06/2014 FINDINGS: CT HEAD FINDINGS Brain: No acute territorial infarction, hemorrhage, or intracranial mass is seen. Mild to moderate atrophy. Scattered periventricular and subcortical hypodensities in the white matter consistent with small vessel ischemic changes. Stable ventricle size. Vascular: No hyperdense vessels.  No unexpected calcification Skull: No fracture or suspicious bone lesion Sinuses/Orbits: No acute orbital abnormality. Postsurgical changes of the sinuses. Mild mucosal thickening is evident. Other: None CT CERVICAL SPINE FINDINGS Alignment: No subluxation.  Facet alignment is within normal limits. Skull base and vertebrae: Craniovertebral junction is intact. There is no acute fracture visualized. Soft tissues and spinal canal: No prevertebral fluid or swelling. No visible canal hematoma. Disc levels: Mild degenerative disc changes at C5-C6. Foraminal narrowing bilaterally at C5-C6 due to bony spurring. Upper chest: Stable tiny hypodense nodule left lobe of thyroid. Other: None IMPRESSION: 1. No CT evidence for acute intracranial abnormality. Small vessel ischemic changes of the white matter and atrophy 2. No acute osseous abnormality of the cervical spine Electronically Signed   By: Jasmine Pang M.D.   On: 11/07/2016 00:37   Dg Hip Unilat W Or Wo Pelvis 2-3 Views Right  Result Date: 11/07/2016 CLINICAL DATA:  Fall out of bed at nursing home.  Right hip pain. EXAM: DG HIP (WITH OR WITHOUT PELVIS) 2-3V RIGHT COMPARISON:  Right hip radiographs and CT 06/06/2014 FINDINGS: Chronic deformity of the right acetabulum and proximal femur with post surgical clamps laterally. No significant change from prior exam. No evidence of acute fracture. Pubic rami are intact. Sacroiliac joints are congruent number IMPRESSION: Chronic deformity of the right hip  without evidence of  acute fracture. Electronically Signed   By: Rubye OaksMelanie  Ehinger M.D.   On: 11/07/2016 01:00    Procedures Procedures (including critical care time)  Medications Ordered in ED Medications - No data to display   Initial Impression / Assessment and Plan / ED Course  I have reviewed the triage vital signs and the nursing notes.  Pertinent labs & imaging results that were available during my care of the patient were reviewed by me and considered in my medical decision making (see chart for details).    Patient was placed in a c-collar. I did x-ray the areas that hurt however I do not think that these are new pains, some appear to be chronic problems. However patient states they aren't new.  Patient scan shows some worsening of her degenerative changes in her lower lumbar spine otherwise nothing acute. She was discharged back to her facility.  Final Clinical Impressions(s) / ED Diagnoses   Final diagnoses:  Fall at nursing home, initial encounter    New Prescriptions None  Plan discharge  Devoria AlbeIva Sanjith Siwek, MD, Concha PyoFACEP    Andris Brothers, MD 11/07/16 0110

## 2016-11-07 NOTE — Discharge Instructions (Signed)
Her head and cervical CT scans do not show any acute changes. Her lumbar spine xrays show progression of her lower lumbar disc disease. She has old rib fractures on the left and changes in her right hip that are old and unchanged.

## 2016-12-25 ENCOUNTER — Emergency Department (HOSPITAL_COMMUNITY): Payer: Medicare Other

## 2016-12-25 ENCOUNTER — Emergency Department (HOSPITAL_COMMUNITY)
Admission: EM | Admit: 2016-12-25 | Discharge: 2016-12-25 | Disposition: A | Discharge status: Skilled Nursing Facility | Payer: Medicare Other | Attending: Emergency Medicine | Admitting: Emergency Medicine

## 2016-12-25 ENCOUNTER — Encounter (HOSPITAL_COMMUNITY): Payer: Self-pay | Admitting: Cardiology

## 2016-12-25 DIAGNOSIS — E785 Hyperlipidemia, unspecified: Secondary | ICD-10-CM | POA: Diagnosis not present

## 2016-12-25 DIAGNOSIS — Y939 Activity, unspecified: Secondary | ICD-10-CM | POA: Insufficient documentation

## 2016-12-25 DIAGNOSIS — S0083XA Contusion of other part of head, initial encounter: Secondary | ICD-10-CM

## 2016-12-25 DIAGNOSIS — W19XXXA Unspecified fall, initial encounter: Secondary | ICD-10-CM

## 2016-12-25 DIAGNOSIS — E039 Hypothyroidism, unspecified: Secondary | ICD-10-CM | POA: Insufficient documentation

## 2016-12-25 DIAGNOSIS — Y929 Unspecified place or not applicable: Secondary | ICD-10-CM | POA: Insufficient documentation

## 2016-12-25 DIAGNOSIS — W06XXXA Fall from bed, initial encounter: Secondary | ICD-10-CM | POA: Diagnosis not present

## 2016-12-25 DIAGNOSIS — S0990XA Unspecified injury of head, initial encounter: Secondary | ICD-10-CM | POA: Diagnosis not present

## 2016-12-25 DIAGNOSIS — Z7982 Long term (current) use of aspirin: Secondary | ICD-10-CM | POA: Insufficient documentation

## 2016-12-25 DIAGNOSIS — Y998 Other external cause status: Secondary | ICD-10-CM | POA: Insufficient documentation

## 2016-12-25 DIAGNOSIS — Z79899 Other long term (current) drug therapy: Secondary | ICD-10-CM | POA: Insufficient documentation

## 2016-12-25 HISTORY — DX: Pressure ulcer of unspecified site, unspecified stage: L89.90

## 2016-12-25 HISTORY — DX: Anxiety disorder, unspecified: F41.9

## 2016-12-25 HISTORY — DX: Pneumonia, unspecified organism: J18.9

## 2016-12-25 HISTORY — DX: Dysphagia, unspecified: R13.10

## 2016-12-25 HISTORY — DX: Major depressive disorder, single episode, unspecified: F32.9

## 2016-12-25 HISTORY — DX: Hyperlipidemia, unspecified: E78.5

## 2016-12-25 HISTORY — DX: Disorder of thyroid, unspecified: E07.9

## 2016-12-25 NOTE — ED Triage Notes (Addendum)
Resident at [facility].  Pt rolled out of bed this morning.  Hematoma and abrasion to forehead.  C/o headache.

## 2016-12-25 NOTE — ED Notes (Signed)
EMS has been called for pt.

## 2016-12-25 NOTE — ED Notes (Signed)
Pt to CT

## 2016-12-25 NOTE — ED Notes (Signed)
MD states pt will be discharged home. Will dress wound and call nursing facility

## 2016-12-25 NOTE — ED Notes (Signed)
Have applied pure wick to patient.

## 2016-12-25 NOTE — ED Provider Notes (Signed)
[facility] Note   CSN: [id number] Arrival date & time: 12/25/16  1010     History   Chief Complaint Chief Complaint  Patient presents with  . Fall    HPI [name] is a 71 y.o. female.  Level V caveat for dementia. Patient accidentally rolled out of but this morning striking her left forehead. No change in behavior. No prodromal illnesses. She now complains of a hematoma on her left forehead. No other apparent injuries.      Past Medical History:  Diagnosis Date  . Anxiety   . Dysphagia   . GERD (gastroesophageal reflux disease)   . Gout   . Hyperlipidemia   . IBS (irritable bowel syndrome)   . Major depressive disorder   . Pneumonia   . Pressure ulcer   . Psychosis (HCC)   . Thyroid disease    hypothyroidism    There are no active problems to display for this patient.   History reviewed. No pertinent surgical history.  OB History    No data available       Home Medications    Prior to Admission medications   Medication Sig Start Date End Date Taking? Authorizing Provider  aspirin EC 81 MG tablet Take 81 mg by mouth daily.   Yes [provider]  atorvastatin (LIPITOR) 10 MG tablet Take 10 mg by mouth daily at 6 PM.   Yes [provider]  buPROPion (WELLBUTRIN XL) 300 MG 24 hr tablet Take 300 mg by mouth daily.   Yes [provider]  calcium carbonate (OS-CAL) 600 MG TABS tablet Take 600 mg by mouth 2 (two) times daily with a meal.   Yes [provider]  Cholecalciferol (VITAMIN D-3) 5000 units TABS Take 1 tablet by mouth daily.   Yes [provider]  dicyclomine (BENTYL) 10 MG capsule Take 10 mg by mouth 4 (four) times daily -  before meals and at bedtime.   Yes [provider]  ferrous sulfate 325 (65 FE) MG tablet Take 325 mg by mouth daily with breakfast.   Yes [provider]  gabapentin (NEURONTIN) 400 MG capsule Take 400 mg by mouth 3 (three) times daily.   Yes  [provider]  levothyroxine (SYNTHROID, LEVOTHROID) 50 MCG tablet Take 50 mcg by mouth daily before breakfast.   Yes [provider]  LORazepam (ATIVAN) 0.5 MG tablet Take 0.5 mg by mouth at bedtime.   Yes [provider]  LORazepam (ATIVAN) 1 MG tablet Take 1 mg by mouth 2 (two) times daily.   Yes [provider]  mirtazapine (REMERON) 45 MG tablet Take 45 mg by mouth at bedtime.   Yes [provider]  morphine (MS CONTIN) 30 MG 12 hr tablet Take 30 mg by mouth every 12 (twelve) hours.   Yes [provider]  OLANZapine (ZYPREXA) 15 MG tablet Take 15 mg by mouth at bedtime.   Yes [provider]  omeprazole (PRILOSEC) 20 MG capsule Take 20 mg by mouth daily.   Yes [provider]  oxybutynin (DITROPAN-XL) 5 MG 24 hr tablet Take 5 mg by mouth at bedtime.   Yes [provider]  saccharomyces boulardii (FLORASTOR) 250 MG capsule Take 250 mg by mouth daily.   Yes [provider]  sennosides-docusate sodium (SENOKOT-S) 8.6-50 MG tablet Take 1 tablet by mouth 2 (two) times daily.   Yes [provider]  vitamin C (ASCORBIC ACID) 500 MG tablet Take 500 mg by  mouth daily.   Yes [provider]    Family History History reviewed. No pertinent family history.  Social History Social History  Substance Use Topics  . Smoking status: Never Smoker  . Smokeless tobacco: Never Used  . Alcohol use No     Allergies   Patient has no known allergies.   Review of Systems Review of Systems  Unable to perform ROS: Dementia     Physical Exam Updated Vital Signs BP (!) 98/56 (BP Location: Left Arm)   Pulse 65   Temp 97.8 F (36.6 C)   Resp 17   Ht  (1.702 m)   Wt 54.4 kg (120 lb)   SpO2 99%   BMI 18.79 kg/m   Physical Exam  Constitutional: She is oriented to person, place, and time.  nad  HENT:  Head: Normocephalic.  3.0 cm left forehead hematoma  Eyes: Conjunctivae are  normal.  Neck: Neck supple.  Cardiovascular: Normal rate and regular rhythm.   Pulmonary/Chest: Effort normal and breath sounds normal.  Abdominal: Soft. Bowel sounds are normal.  Musculoskeletal: Normal range of motion.  Neurological: She is alert and oriented to person, place, and time.  Skin: Skin is warm and dry.  Psychiatric:  Flat affect  Nursing note and vitals reviewed.    ED Treatments / Results  Labs (all labs ordered are listed, but only abnormal results are displayed) Labs Reviewed - No data to display  EKG  EKG Interpretation None       Radiology Ct Head Wo Contrast  Result Date: 12/25/2016 CLINICAL DATA:  Headache after falling out of bed this morning. EXAM: CT HEAD WITHOUT CONTRAST CT CERVICAL SPINE WITHOUT CONTRAST TECHNIQUE: Multidetector CT imaging of the head and cervical spine was performed following the standard protocol without intravenous contrast. Multiplanar CT image reconstructions of the cervical spine were also generated. COMPARISON:  None. FINDINGS: CT HEAD FINDINGS Brain: No evidence of acute infarction, hemorrhage, hydrocephalus, extra-axial collection or mass lesion/mass effect. Age-related cerebral atrophy with compensatory dilatation of the ventricles. Mild periventricular white matter and corona radiata hypodensities favor chronic ischemic microvascular white matter disease. Vascular: No hyperdense vessel or unexpected calcification. Skull: Normal. Negative for fracture or focal lesion. Sinuses/Orbits: Prior bilateral maxillary antrostomies and ethmoidectomies. Minimal secretions in the right sphenoid sinus. The mastoid air cells are clear. The orbits are unremarkable. Other: Small left frontal scalp hematoma. CT CERVICAL SPINE FINDINGS Alignment: Normal. Skull base and vertebrae: No acute fracture. No primary bone lesion or focal pathologic process. Congenital nonunion of the posterior arch of C1. Soft tissues and spinal canal: No prevertebral fluid or  swelling. No visible canal hematoma. Disc levels: Mild degenerative disc disease and facet uncovertebral hypertrophy at C5-C6. Upper chest: Negative. Other: None. IMPRESSION: 1.  No acute intracranial abnormality. 2.  No acute cervical spine fracture. 3. Small left frontal scalp hematoma. Electronically Signed   By: Obie Dredge M.D.   On: 12/25/2016 12:49   Ct Cervical Spine Wo Contrast  Result Date: 12/25/2016 CLINICAL DATA:  Headache after falling out of bed this morning. EXAM: CT HEAD WITHOUT CONTRAST CT CERVICAL SPINE WITHOUT CONTRAST TECHNIQUE: Multidetector CT imaging of the head and cervical spine was performed following the standard protocol without intravenous contrast. Multiplanar CT image reconstructions of the cervical spine were also generated. COMPARISON:  None. FINDINGS: CT HEAD FINDINGS Brain: No evidence of acute infarction, hemorrhage, hydrocephalus, extra-axial collection or mass lesion/mass effect. Age-related cerebral atrophy with compensatory dilatation of the ventricles. Mild periventricular white  matter and corona radiata hypodensities favor chronic ischemic microvascular white matter disease. Vascular: No hyperdense vessel or unexpected calcification. Skull: Normal. Negative for fracture or focal lesion. Sinuses/Orbits: Prior bilateral maxillary antrostomies and ethmoidectomies. Minimal secretions in the right sphenoid sinus. The mastoid air cells are clear. The orbits are unremarkable. Other: Small left frontal scalp hematoma. CT CERVICAL SPINE FINDINGS Alignment: Normal. Skull base and vertebrae: No acute fracture. No primary bone lesion or focal pathologic process. Congenital nonunion of the posterior arch of C1. Soft tissues and spinal canal: No prevertebral fluid or swelling. No visible canal hematoma. Disc levels: Mild degenerative disc disease and facet uncovertebral hypertrophy at C5-C6. Upper chest: Negative. Other: None. IMPRESSION: 1.  No acute intracranial abnormality. 2.   No acute cervical spine fracture. 3. Small left frontal scalp hematoma. Electronically Signed   By: Obie Dredge M.D.   On: 12/25/2016 12:49    Procedures Procedures (including critical care time)  Medications Ordered in ED Medications - No data to display   Initial Impression / Assessment and Plan / ED Course  I have reviewed the triage vital signs and the nursing notes.  Pertinent labs & imaging results that were available during my care of the patient were reviewed by me and considered in my medical decision making (see chart for details).     Patient appears to be at baseline behavior. CT head and CT cervical spine show no acute injury. She has an obvious hematoma over her left forehead.  Final Clinical Impressions(s) / ED Diagnoses   Final diagnoses:  Fall, initial encounter  Injury of head, initial encounter  Traumatic hematoma of forehead, initial encounter    New Prescriptions New Prescriptions   No medications on file     Donnetta Hutching, MD 12/25/16 1451

## 2016-12-25 NOTE — Discharge Instructions (Signed)
CT scan of head and neck showed no serious injury. She has a large hematoma on her forehead. Ice pack.

## 2016-12-25 NOTE — ED Notes (Signed)
Pt has a large sided knot on top of left side on face.

## 2016-12-25 NOTE — ED Notes (Addendum)
If PICC line or Sedation is needed.  Legal guardian in to be notified first.   [name]  [phone]

## 2016-12-25 NOTE — ED Notes (Signed)
Informed Dr. Adriana Simas about O2 sats and he informed me he would come and assess him

## 2016-12-26 ENCOUNTER — Encounter (HOSPITAL_COMMUNITY): Payer: Self-pay | Admitting: *Deleted

## 2016-12-28 ENCOUNTER — Ambulatory Visit: Payer: Medicare Other | Admitting: Gastroenterology

## 2016-12-28 ENCOUNTER — Encounter: Payer: Self-pay | Admitting: Gastroenterology

## 2016-12-28 ENCOUNTER — Telehealth: Payer: Self-pay | Admitting: Gastroenterology

## 2016-12-28 NOTE — Telephone Encounter (Signed)
PATIENT WAS A NO SHOW AND LETTER SENT  °

## 2017-01-31 ENCOUNTER — Ambulatory Visit: Payer: Medicare Other | Admitting: Gastroenterology

## 2017-03-06 ENCOUNTER — Ambulatory Visit (INDEPENDENT_AMBULATORY_CARE_PROVIDER_SITE_OTHER): Payer: Medicare Other | Admitting: Gastroenterology

## 2017-03-06 ENCOUNTER — Encounter: Payer: Self-pay | Admitting: Gastroenterology

## 2017-03-06 DIAGNOSIS — R1310 Dysphagia, unspecified: Secondary | ICD-10-CM

## 2017-03-06 NOTE — Patient Instructions (Addendum)
1. Please obtain CBC today. Fax results to [phone]. 2. Upper endoscopy to be scheduled.  3. Send copy of weights for last 3 months to [phone].

## 2017-03-06 NOTE — Assessment & Plan Note (Signed)
71 year old female presenting for follow-up.  She has a history of food impaction back in April, the esophagus was not dilated because she was on Xarelto at the time.  She was disimpacted only.  She was scheduled for EGD with dilation in June her procedure was canceled because she consumed a full breakfast that morning.  Her history is unreliable.  Previously evaluated by speech therapy, previously discussed at length with nursing staff as outlined.  Pursue EGD with dilation as previously planned.  Given polypharmacy however we will plan for deep sedation.  She is no longer on Xarelto according to her MAR is but we will call and confirm this as well. Consent to be obtained from her legal guardian, [name].  I have discussed the risks, alternatives, benefits with regards to but not limited to the risk of reaction to medication, bleeding, infection, perforation and the patient is agreeable to proceed. Written consent to be obtained.  Have requested for the last 3 months of weights to be sent to our office for review.  In addition she is quite pale on exam, slightly hypotensive therefore will check CBC today.

## 2017-03-06 NOTE — Progress Notes (Signed)
cc'ed to pcp °

## 2017-03-06 NOTE — Progress Notes (Signed)
Please call [facility] and make sure patient is not on blood thinners, she was previously on Xarelto.

## 2017-03-06 NOTE — Progress Notes (Signed)
Primary Care Physician:  Charlynne PanderBlass, Joel, MD  Primary Gastroenterologist:  Roetta SessionsMichael Rourk, MD   Chief Complaint  Patient presents with  . Dysphagia    HPI:  [name] is a 71 y.o. female here for follow-up of dysphagia.  She was seen back in May of this year.  She has a history of depression, psychosis, memory deficits, resides at [facility].  She had a food impaction back in April when she presented to the ED after eating Saulsberry steak and this became lodged.  She was chronically anticoagulated for DVT, on Xarelto.  When she presented to the ED, she underwent a EGD with disimpaction but given her chronic anticoagulation there was no attempt for dilation.  BPE was ordered to further evaluate her dysphagia but patient was unable to swallow the barium after multiple attempts therefore the test could not be done.  When I saw her in 07/2016, nursing staff reported reported DVT around 04/2016 and started Xarelto then. Weight down about 20 pounds in one year, couple of episodes of dysphagia. We received speech therapy notes from back in April when patient initially started having "choking during meals". Patient was observed by the speech therapist during an episode where the patient was eating and felt like the food was lodged in the chest. She was able to talk and breathe. Felt likely esophageal dysphagia/stricture.  Unfortunately the patient's EGD was canceled back in June because she ate a full breakfast morning of her procedure.  She did not make her December 28, 2016 follow-up appointment.  Patient was unable to get on our scales. We have requested copy of her weights for last three months. Her history is somewhat unreliable.  She does complain of dysphagia at times especially with large pills.  She denies abdominal pain, bowel concerns.  She is quite pale on exam and historically has had somewhat low blood pressure.  No longer on Xarelto per MAR.   Current Outpatient Medications  Medication Sig  Dispense Refill  . alum & mag hydroxide-simeth (MAALOX/MYLANTA) 200-200-20 MG/5ML suspension Take 30 mLs by mouth every 6 (six) hours as needed for indigestion.    Marland Kitchen. aspirin EC 81 MG tablet Take 81 mg by mouth daily.    Marland Kitchen. atorvastatin (LIPITOR) 10 MG tablet Take 10 mg by mouth daily at 10 pm.     . buPROPion (WELLBUTRIN XL) 300 MG 24 hr tablet Take 300 mg by mouth daily.    . calcium carbonate (OS-CAL) 600 MG TABS tablet Take 600 mg by mouth 2 (two) times daily with a meal.    . chlorhexidine (PERIDEX) 0.12 % solution Use as directed 15 mLs in the mouth or throat every 12 (twelve) hours as needed (gum/mouth pain).     . Cholecalciferol (VITAMIN D-3) 5000 units TABS Take 1 tablet by mouth daily.    Marland Kitchen. denosumab (PROLIA) 60 MG/ML SOLN injection Inject 60 mg into the skin every 6 (six) months. Administer in upper arm, thigh, or abdomen    . Dextromethorphan-Guaifenesin (MUCINEX DM) 30-600 MG TB12 Take 2 tablets by mouth every 12 (twelve) hours as needed (chest congestion).     Marland Kitchen. dicyclomine (BENTYL) 10 MG capsule Take 1 capsule (10 mg total) by mouth 4 (four) times daily -  before meals and at bedtime. 40 capsule 0  . ferrous sulfate 325 (65 FE) MG tablet Take 325 mg by mouth 2 (two) times daily with a meal.     . gabapentin (NEURONTIN) 400 MG capsule Take 400 mg by  mouth every 8 (eight) hours. Hold for sedation or sleep    . levothyroxine (SYNTHROID, LEVOTHROID) 50 MCG tablet Take 50 mcg by mouth daily before breakfast.     . loperamide (IMODIUM) 2 MG capsule Take 2 mg by mouth every 6 (six) hours as needed for diarrhea or loose stools.     Marland Kitchen. LORazepam (ATIVAN) 0.5 MG tablet Take 0.5 mg by mouth at bedtime.    Marland Kitchen. morphine (MS CONTIN) 30 MG 12 hr tablet Take 30 mg by mouth every 12 (twelve) hours.    Marland Kitchen. OLANZapine (ZYPREXA) 15 MG tablet Take 15 mg by mouth at bedtime.    Marland Kitchen. omeprazole (PRILOSEC) 20 MG capsule Take 20 mg by mouth daily.    Marland Kitchen. oxybutynin (DITROPAN-XL) 5 MG 24 hr tablet Take 5 mg by mouth at  bedtime.    . Oxycodone HCl 10 MG TABS Take 1 tablet (10 mg total) by mouth every 6 (six) hours as needed (moderate to severe pain). 10 tablet 0  . polyvinyl alcohol (LIQUIFILM TEARS) 1.4 % ophthalmic solution Place 1 drop into both eyes daily as needed for dry eyes.     . Probiotic CAPS Take 1 capsule by mouth daily.    Marland Kitchen. senna-docusate (SENNA PLUS) 8.6-50 MG per tablet Take 2 tablets by mouth 2 (two) times daily.    . vitamin C (ASCORBIC ACID) 500 MG tablet Take 500 mg by mouth 2 (two) times daily.     Marland Kitchen. acetaminophen (TYLENOL) 325 MG tablet Take 650 mg by mouth every 6 (six) hours as needed for fever.      No current facility-administered medications for this visit.     Allergies as of 03/06/2017 - Review Complete 03/06/2017  Allergen Reaction Noted  . Diphenhydramine Other (See Comments) 04/30/2011  . Tape Dermatitis 11/20/2013    Past Medical History:  Diagnosis Date  . Anemia   . Anemia   . Anxiety   . Anxiety and depression   . Bacteremia   . Cellulitis   . Chronic back pain   . Chronic diarrhea   . Chronic right hip pain   . Complication of anesthesia   . Depression   . DVT (deep venous thrombosis) (HCC)   . Dysphagia   . GERD (gastroesophageal reflux disease)   . Gout   . Gout   . Hyperlipidemia   . Hypopotassemia   . Hypothyroidism   . IBS (irritable bowel syndrome)   . Major depressive disorder   . Muscle weakness (generalized)   . Osteoporosis   . Pneumonia   . PONV (postoperative nausea and vomiting)   . Pressure ulcer   . Pressure ulcer of buttock   . Psychosis (HCC)   . Thyroid disease    hypothyroidism    Past Surgical History:  Procedure Laterality Date  . ESOPHAGOGASTRODUODENOSCOPY N/A 07/09/2016   Procedure: ESOPHAGOGASTRODUODENOSCOPY (EGD);  Surgeon: Corbin Adeobert M Rourk, MD;  Location: [facility];  Service: Endoscopy;  Laterality: N/A;  . HIP ARTHROPLASTY  05/01/2011   Procedure: right ARTHROPLASTY BIPOLAR HIP;  Surgeon: Fuller CanadaStanley Harrison, MD;   Location: [facility];  Service: Orthopedics;  Laterality: Right;  . INCISION AND DRAINAGE HIP  02/08/2012   Procedure: IRRIGATION AND DEBRIDEMENT HIP;  Surgeon: Vickki HearingStanley E Harrison, MD;  Location: [facility];  Service: Orthopedics;  Laterality: Right;  . INCISION AND DRAINAGE OF WOUND  05/24/2011   Procedure: IRRIGATION AND DEBRIDEMENT WOUND;  Surgeon: Fuller CanadaStanley Harrison, MD;  Location: [facility];  Service: Orthopedics;  Laterality: Right;  No family history on file.  Social History   Socioeconomic History  . Marital status: Single    Spouse name: Not on file  . Number of children: Not on file  . Years of education: Not on file  . Highest education level: Not on file  Social Needs  . Financial resource strain: Not on file  . Food insecurity - worry: Not on file  . Food insecurity - inability: Not on file  . Transportation needs - medical: Not on file  . Transportation needs - non-medical: Not on file  Occupational History  . Not on file  Tobacco Use  . Smoking status: Never Smoker  . Smokeless tobacco: Never Used  Substance and Sexual Activity  . Alcohol use: No  . Drug use: No  . Sexual activity: Not on file  Other Topics Concern  . Not on file  Social History Narrative   ** Merged History Encounter **       Patient's legal guardian is Mr. Zebedee Iba. Phone number [phone].      ROS: unreliable due to dementia.    Physical Examination:  BP (!) 84/48   Pulse (!) 54   Temp 97.6 F (36.4 C) (Oral)   Ht 5\' 7"  (1.702 m)   BMI 18.79 kg/m    General: frail, pale, elderly WF in NAD. Presents with transporter (from the medical records department at the facility)  Head: Normocephalic, atraumatic.   Eyes: Conjunctiva pale, no icterus. Mouth: Oropharyngeal mucosa moist and pink , no lesions erythema or exudate. Neck: Supple without thyromegaly, masses, or lymphadenopathy.  Lungs: Clear to auscultation bilaterally.  Heart: Regular rate and rhythm, no murmurs rubs or gallops.   Abdomen: Bowel sounds are normal, nontender, nondistended, no hepatosplenomegaly or masses, no abdominal bruits or    hernia , no rebound or guarding.   Rectal: not performed Extremities: No lower extremity edema. No clubbing or deformities.  Neuro: Alert and oriented x 4 , grossly normal neurologically.  Skin: Warm and dry, no rash or jaundice.   Psych: Alert and cooperative, normal mood and affect.   Imaging Studies: No results found.

## 2017-03-07 ENCOUNTER — Telehealth: Payer: Self-pay

## 2017-03-07 ENCOUNTER — Other Ambulatory Visit: Payer: Self-pay

## 2017-03-07 DIAGNOSIS — T18128D Food in esophagus causing other injury, subsequent encounter: Secondary | ICD-10-CM

## 2017-03-07 DIAGNOSIS — R1310 Dysphagia, unspecified: Secondary | ICD-10-CM

## 2017-03-07 NOTE — Telephone Encounter (Signed)
Called [name] at [facility]. EGD/Dil w/Propofol w/RMR scheduled for 04/18/17 at 10:30am. Pre-op appt 04/15/17 at 10:00am. Called [name] back and informed of pre-op appt.  Instructions faxed to [facility], attn: [name].

## 2017-03-07 NOTE — Progress Notes (Signed)
Pt is not on a blood thinner per p[facility] nurse [facility] [place].

## 2017-03-07 NOTE — Telephone Encounter (Signed)
Called pt's legal guardian Zebedee Iba(Ron Johnson) and informed him of EGD/Dil scheduled for 04/18/17 at 10:30am and pre-op appt 04/15/17 at 10:00am. He will need to call hospital to give verbal consent for procedure. He wanted the hospital to call him the day of her pre-op for him to give consent since her procedure is so far out. Called Endo scheduler, she advised they could call him the day of her pre-op, however, he doesn't answer the call they will not be able to proceed with her pre-op appt and procedure. Called him back and told him. He questioned what if he wasn't available. Advised him to call Endo scheduler to discuss giving verbal consent. Gave him phone number for Endo scheduler.

## 2017-04-10 NOTE — Patient Instructions (Signed)
[name]  04/10/2017     @PREFPERIOPPHARMACY @   Your procedure is scheduled on  04/18/2017   Report to [facility] at  830   A.M.  Call this number if you have problems the morning of surgery:  [phone]   Remember:  Do not eat food or drink liquids after midnight.  Take these medicines the morning of surgery with A SIP OF WATER  Wellbutrin, neurontin, levothyroxine, ativan, MS contin or oxycodone, prilosec.   Do not wear jewelry, make-up or nail polish.  Do not wear lotions, powders, or perfumes, or deodorant.  Do not shave 48 hours prior to surgery.  Men may shave face and neck.  Do not bring valuables to the hospital.  [facility] is not responsible for any belongings or valuables.  Contacts, dentures or bridgework may not be worn into surgery.  Leave your suitcase in the car.  After surgery it may be brought to your room.  For patients admitted to the hospital, discharge time will be determined by your treatment team.  Patients discharged the day of surgery will not be allowed to drive home.   Name and phone number of your driver:   family Special instructions:  Follow the diet instructions given to you by Dr Luvenia Starchourk's office.  Please read over the following fact sheets that you were given. Anesthesia Post-op Instructions and Care and Recovery After Surgery       Esophagogastroduodenoscopy Esophagogastroduodenoscopy (EGD) is a procedure to examine the lining of the esophagus, stomach, and first part of the small intestine (duodenum). This procedure is done to check for problems such as inflammation, bleeding, ulcers, or growths. During this procedure, a long, flexible, lighted tube with a camera attached (endoscope) is inserted down the throat. Tell a health care provider about:  Any allergies you have.  All medicines you are taking, including vitamins, herbs, eye drops, creams, and over-the-counter medicines.  Any problems you or family  members have had with anesthetic medicines.  Any blood disorders you have.  Any surgeries you have had.  Any medical conditions you have.  Whether you are pregnant or may be pregnant. What are the risks? Generally, this is a safe procedure. However, problems may occur, including:  Infection.  Bleeding.  A tear (perforation) in the esophagus, stomach, or duodenum.  Trouble breathing.  Excessive sweating.  Spasms of the larynx.  A slowed heartbeat.  Low blood pressure.  What happens before the procedure?  Follow instructions from your health care provider about eating or drinking restrictions.  Ask your health care provider about: ? Changing or stopping your regular medicines. This is especially important if you are taking diabetes medicines or blood thinners. ? Taking medicines such as aspirin and ibuprofen. These medicines can thin your blood. Do not take these medicines before your procedure if your health care provider instructs you not to.  Plan to have someone take you home after the procedure.  If you wear dentures, be ready to remove them before the procedure. What happens during the procedure?  To reduce your risk of infection, your health care team will wash or sanitize their hands.  An IV tube will be put in a vein in your hand or arm. You will get medicines and fluids through this tube.  You will be given one or more of the following: ? A medicine to help you relax (sedative). ? A medicine to numb the  area (local anesthetic). This medicine may be sprayed into your throat. It will make you feel more comfortable and keep you from gagging or coughing during the procedure. ? A medicine for pain.  A mouth guard may be placed in your mouth to protect your teeth and to keep you from biting on the endoscope.  You will be asked to lie on your left side.  The endoscope will be lowered down your throat into your esophagus, stomach, and duodenum.  Air will be put  into the endoscope. This will help your health care provider see better.  The lining of your esophagus, stomach, and duodenum will be examined.  Your health care provider may: ? Take a tissue sample so it can be looked at in a lab (biopsy). ? Remove growths. ? Remove objects (foreign bodies) that are stuck. ? Treat any bleeding with medicines or other devices that stop tissue from bleeding. ? Widen (dilate) or stretch narrowed areas of your esophagus and stomach.  The endoscope will be taken out. The procedure may vary among health care providers and hospitals. What happens after the procedure?  Your blood pressure, heart rate, breathing rate, and blood oxygen level will be monitored often until the medicines you were given have worn off.  Do not eat or drink anything until the numbing medicine has worn off and your gag reflex has returned. This information is not intended to replace advice given to you by your health care provider. Make sure you discuss any questions you have with your health care provider. Document Released: 07/13/2004 Document Revised: 08/18/2015 Document Reviewed: 02/03/2015 Elsevier Interactive Patient Education  2018 [employer]. Esophagogastroduodenoscopy, Care After Refer to this sheet in the next few weeks. These instructions provide you with information about caring for yourself after your procedure. Your health care provider may also give you more specific instructions. Your treatment has been planned according to current medical practices, but problems sometimes occur. Call your health care provider if you have any problems or questions after your procedure. What can I expect after the procedure? After the procedure, it is common to have:  A sore throat.  Nausea.  Bloating.  Dizziness.  Fatigue.  Follow these instructions at home:  Do not eat or drink anything until the numbing medicine (local anesthetic) has worn off and your gag reflex has  returned. You will know that the local anesthetic has worn off when you can swallow comfortably.  Do not drive for 24 hours if you received a medicine to help you relax (sedative).  If your health care provider took a tissue sample for testing during the procedure, make sure to get your test results. This is your responsibility. Ask your health care provider or the department performing the test when your results will be ready.  Keep all follow-up visits as told by your health care provider. This is important. Contact a health care provider if:  You cannot stop coughing.  You are not urinating.  You are urinating less than usual. Get help right away if:  You have trouble swallowing.  You cannot eat or drink.  You have throat or chest pain that gets worse.  You are dizzy or light-headed.  You faint.  You have nausea or vomiting.  You have chills.  You have a fever.  You have severe abdominal pain.  You have black, tarry, or bloody stools. This information is not intended to replace advice given to you by your health care provider. Make sure you  discuss any questions you have with your health care provider. Document Released: 02/27/2012 Document Revised: 08/18/2015 Document Reviewed: 02/03/2015 Elsevier Interactive Patient Education  2018 [employer].  Esophageal Dilatation Esophageal dilatation is a procedure to open a blocked or narrowed part of the esophagus. The esophagus is the long tube in your throat that carries food and liquid from your mouth to your stomach. The procedure is also called esophageal dilation. You may need this procedure if you have a buildup of scar tissue in your esophagus that makes it difficult, painful, or even impossible to swallow. This can be caused by gastroesophageal reflux disease (GERD). In rare cases, people need this procedure because they have cancer of the esophagus or a problem with the way food moves through the esophagus. Sometimes  you may need to have another dilatation to enlarge the opening of the esophagus gradually. Tell a health care provider about:  Any allergies you have.  All medicines you are taking, including vitamins, herbs, eye drops, creams, and over-the-counter medicines.  Any problems you or family members have had with anesthetic medicines.  Any blood disorders you have.  Any surgeries you have had.  Any medical conditions you have.  Any antibiotic medicines you are required to take before dental procedures. What are the risks? Generally, this is a safe procedure. However, problems can occur and include:  Bleeding from a tear in the lining of the esophagus.  A hole (perforation) in the esophagus.  What happens before the procedure?  Do not eat or drink anything after midnight on the night before the procedure or as directed by your health care provider.  Ask your health care provider about changing or stopping your regular medicines. This is especially important if you are taking diabetes medicines or blood thinners.  Plan to have someone take you home after the procedure. What happens during the procedure?  You will be given a medicine that makes you relaxed and sleepy (sedative).  A medicine may be sprayed or gargled to numb the back of the throat.  Your health care provider can use various instruments to do an esophageal dilatation. During the procedure, the instrument used will be placed in your mouth and passed down into your esophagus. Options include: ? Simple dilators. This instrument is carefully placed in the esophagus to stretch it. ? Guided wire bougies. In this method, a flexible tube (endoscope) is used to insert a wire into the esophagus. The dilator is passed over this wire to enlarge the esophagus. Then the wire is removed. ? Balloon dilators. An endoscope with a small balloon at the end is passed down into the esophagus. Inflating the balloon gently stretches the  esophagus and opens it up. What happens after the procedure?  Your blood pressure, heart rate, breathing rate, and blood oxygen level will be monitored often until the medicines you were given have worn off.  Your throat may feel slightly sore and will probably still feel numb. This will improve slowly over time.  You will not be allowed to eat or drink until the throat numbness has resolved.  If this is a same-day procedure, you may be allowed to go home once you have been able to drink, urinate, and sit on the edge of the bed without nausea or dizziness.  If this is a same-day procedure, you should have a friend or family member with you for the next 24 hours after the procedure. This information is not intended to replace advice given to you  by your health care provider. Make sure you discuss any questions you have with your health care provider. Document Released: 05/03/2005 Document Revised: 08/18/2015 Document Reviewed: 07/22/2013 Elsevier Interactive Patient Education  2018 [employer]  Monitored Anesthesia Care Anesthesia is a term that refers to techniques, procedures, and medicines that help a person stay safe and comfortable during a medical procedure. Monitored anesthesia care, or sedation, is one type of anesthesia. Your anesthesia specialist may recommend sedation if you will be having a procedure that does not require you to be unconscious, such as:  Cataract surgery.  A dental procedure.  A biopsy.  A colonoscopy.  During the procedure, you may receive a medicine to help you relax (sedative). There are three levels of sedation:  Mild sedation. At this level, you may feel awake and relaxed. You will be able to follow directions.  Moderate sedation. At this level, you will be sleepy. You may not remember the procedure.  Deep sedation. At this level, you will be asleep. You will not remember the procedure.  The more medicine you are given, the deeper your level of  sedation will be. Depending on how you respond to the procedure, the anesthesia specialist may change your level of sedation or the type of anesthesia to fit your needs. An anesthesia specialist will monitor you closely during the procedure. Let your health care provider know about:  Any allergies you have.  All medicines you are taking, including vitamins, herbs, eye drops, creams, and over-the-counter medicines.  Any use of steroids (by mouth or as a cream).  Any problems you or family members have had with sedatives and anesthetic medicines.  Any blood disorders you have.  Any surgeries you have had.  Any medical conditions you have, such as sleep apnea.  Whether you are pregnant or may be pregnant.  Any use of cigarettes, alcohol, or street drugs. What are the risks? Generally, this is a safe procedure. However, problems may occur, including:  Getting too much medicine (oversedation).  Nausea.  Allergic reaction to medicines.  Trouble breathing. If this happens, a breathing tube may be used to help with breathing. It will be removed when you are awake and breathing on your own.  Heart trouble.  Lung trouble.  Before the procedure Staying hydrated Follow instructions from your health care provider about hydration, which may include:  Up to 2 hours before the procedure - you may continue to drink clear liquids, such as water, clear fruit juice, black coffee, and plain tea.  Eating and drinking restrictions Follow instructions from your health care provider about eating and drinking, which may include:  8 hours before the procedure - stop eating heavy meals or foods such as meat, fried foods, or fatty foods.  6 hours before the procedure - stop eating light meals or foods, such as toast or cereal.  6 hours before the procedure - stop drinking milk or drinks that contain milk.  2 hours before the procedure - stop drinking clear liquids.  Medicines Ask your health  care provider about:  Changing or stopping your regular medicines. This is especially important if you are taking diabetes medicines or blood thinners.  Taking medicines such as aspirin and ibuprofen. These medicines can thin your blood. Do not take these medicines before your procedure if your health care provider instructs you not to.  Tests and exams  You will have a physical exam.  You may have blood tests done to show: ? How well your kidneys  and liver are working. ? How well your blood can clot.  General instructions  Plan to have someone take you home from the hospital or clinic.  If you will be going home right after the procedure, plan to have someone with you for 24 hours.  What happens during the procedure?  Your blood pressure, heart rate, breathing, level of pain and overall condition will be monitored.  An IV tube will be inserted into one of your veins.  Your anesthesia specialist will give you medicines as needed to keep you comfortable during the procedure. This may mean changing the level of sedation.  The procedure will be performed. After the procedure  Your blood pressure, heart rate, breathing rate, and blood oxygen level will be monitored until the medicines you were given have worn off.  Do not drive for 24 hours if you received a sedative.  You may: ? Feel sleepy, clumsy, or nauseous. ? Feel forgetful about what happened after the procedure. ? Have a sore throat if you had a breathing tube during the procedure. ? Vomit. This information is not intended to replace advice given to you by your health care provider. Make sure you discuss any questions you have with your health care provider. Document Released: 12/06/2004 Document Revised: 08/19/2015 Document Reviewed: 07/03/2015 Elsevier Interactive Patient Education  2018 [place], Care After These instructions provide you with information about caring for yourself  after your procedure. Your health care provider may also give you more specific instructions. Your treatment has been planned according to current medical practices, but problems sometimes occur. Call your health care provider if you have any problems or questions after your procedure. What can I expect after the procedure? After your procedure, it is common to:  Feel sleepy for several hours.  Feel clumsy and have poor balance for several hours.  Feel forgetful about what happened after the procedure.  Have poor judgment for several hours.  Feel nauseous or vomit.  Have a sore throat if you had a breathing tube during the procedure.  Follow these instructions at home: For at least 24 hours after the procedure:   Do not: ? Participate in activities in which you could fall or become injured. ? Drive. ? Use heavy machinery. ? Drink alcohol. ? Take sleeping pills or medicines that cause drowsiness. ? Make important decisions or sign legal documents. ? Take care of children on your own.  Rest. Eating and drinking  Follow the diet that is recommended by your health care provider.  If you vomit, drink water, juice, or soup when you can drink without vomiting.  Make sure you have little or no nausea before eating solid foods. General instructions  Have a responsible adult stay with you until you are awake and alert.  Take over-the-counter and prescription medicines only as told by your health care provider.  If you smoke, do not smoke without supervision.  Keep all follow-up visits as told by your health care provider. This is important. Contact a health care provider if:  You keep feeling nauseous or you keep vomiting.  You feel light-headed.  You develop a rash.  You have a fever. Get help right away if:  You have trouble breathing. This information is not intended to replace advice given to you by your health care provider. Make sure you discuss any questions you  have with your health care provider. Document Released: 07/03/2015 Document Revised: 11/02/2015 Document Reviewed: 07/03/2015 Elsevier Interactive Patient Education  2018 [employer]  

## 2017-04-11 ENCOUNTER — Other Ambulatory Visit: Payer: Self-pay

## 2017-04-11 ENCOUNTER — Emergency Department (HOSPITAL_COMMUNITY): Payer: Medicare Other

## 2017-04-11 ENCOUNTER — Emergency Department (HOSPITAL_COMMUNITY)
Admission: EM | Admit: 2017-04-11 | Discharge: 2017-04-12 | Disposition: A | Discharge status: Home / Self Care | Payer: Medicare Other | Attending: Emergency Medicine | Admitting: Emergency Medicine

## 2017-04-11 ENCOUNTER — Encounter (HOSPITAL_COMMUNITY): Payer: Self-pay

## 2017-04-11 DIAGNOSIS — Z7982 Long term (current) use of aspirin: Secondary | ICD-10-CM | POA: Diagnosis not present

## 2017-04-11 DIAGNOSIS — Y92129 Unspecified place in nursing home as the place of occurrence of the external cause: Secondary | ICD-10-CM | POA: Diagnosis not present

## 2017-04-11 DIAGNOSIS — W19XXXA Unspecified fall, initial encounter: Secondary | ICD-10-CM | POA: Diagnosis not present

## 2017-04-11 DIAGNOSIS — S0101XA Laceration without foreign body of scalp, initial encounter: Secondary | ICD-10-CM | POA: Insufficient documentation

## 2017-04-11 DIAGNOSIS — Z79899 Other long term (current) drug therapy: Secondary | ICD-10-CM | POA: Diagnosis not present

## 2017-04-11 DIAGNOSIS — Y999 Unspecified external cause status: Secondary | ICD-10-CM | POA: Diagnosis not present

## 2017-04-11 DIAGNOSIS — R51 Headache: Secondary | ICD-10-CM | POA: Diagnosis not present

## 2017-04-11 DIAGNOSIS — Y939 Activity, unspecified: Secondary | ICD-10-CM | POA: Diagnosis not present

## 2017-04-11 DIAGNOSIS — E039 Hypothyroidism, unspecified: Secondary | ICD-10-CM | POA: Insufficient documentation

## 2017-04-11 DIAGNOSIS — S069X0A Unspecified intracranial injury without loss of consciousness, initial encounter: Secondary | ICD-10-CM

## 2017-04-11 NOTE — ED Triage Notes (Signed)
Pt is resident of [facility] in [place].  Pt apparently fell at the facility, has swelling to the back of her head with a small laceration.  No apparent loc.  Pt is awake and alert at this time, bleeding Is controlled

## 2017-04-12 NOTE — ED Notes (Signed)
Lynn [facility] called back for report of patient, report given, waiting on transport back to facility.

## 2017-04-12 NOTE — Discharge Instructions (Signed)
We saw you in the ER after you had a fall. All the imaging results are normal, no fractures seen. No evidence of brain bleed. Please be very careful with walking, and do everything possible to prevent falls.  Staples will need to be removed in 1 week. Keep the scalp wound clean and dry.

## 2017-04-12 NOTE — ED Notes (Signed)
Attempted to give report to [facility], nurse never came to phone, will give discharge instructions to EMS.

## 2017-04-12 NOTE — ED Provider Notes (Signed)
[facility] EMERGENCY DEPARTMENT Provider Note   CSN: [id number] Arrival date & time: 04/11/17  2027     History   Chief Complaint Chief Complaint  Patient presents with  . Fall    HPI [name] is a 72 y.o. female.  HPI  Level 5 caveat for dementia.  72 year old female comes in with chief complaint of fall from her nursing home.  Patient had an unwitnessed fall.  Patient is complaining of a headache and neck pain.  Patient is denying any pain in the upper or lower extremities, or her hips.  Patient is unsure as to the reason why she fell.  Patient has history of DVT, but she is not on any blood thinners.  Past Medical History:  Diagnosis Date  . Anemia   . Anemia   . Anxiety   . Anxiety and depression   . Bacteremia   . Cellulitis   . Chronic back pain   . Chronic diarrhea   . Chronic right hip pain   . Complication of anesthesia   . Depression   . DVT (deep venous thrombosis) (HCC)   . Dysphagia   . GERD (gastroesophageal reflux disease)   . Gout   . Gout   . Hyperlipidemia   . Hypopotassemia   . Hypothyroidism   . IBS (irritable bowel syndrome)   . Major depressive disorder   . Muscle weakness (generalized)   . Osteoporosis   . Pneumonia   . PONV (postoperative nausea and vomiting)   . Pressure ulcer   . Pressure ulcer of buttock   . Psychosis (HCC)   . Thyroid disease    hypothyroidism    Patient Active Problem List   Diagnosis Date Noted  . Dysphagia 03/06/2017  . Food impaction of esophagus 08/06/2016  . Healthcare-associated pneumonia 05/16/2016  . Acute respiratory failure with hypoxia (HCC) 05/16/2016  . Acute encephalopathy 05/16/2016  . Hypothyroidism 05/16/2016  . Influenza 05/16/2016  . Memory deficits 05/16/2016  . 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 Mechanical complication of internal orthopedic implant (HCC) 02/28/2012  . Cellulitis of right hip 01/13/2012  . Psychosis (HCC) 01/13/2012  . Depression 01/13/2012  . Gout  01/13/2012  . Chronic pain 01/13/2012  . Decubitus ulcer of left buttock, stage 4 (HCC) 01/13/2012  . IBS (irritable bowel syndrome) 01/13/2012  . Hypokalemia 01/13/2012  . Anemia, chronic disease 01/13/2012  . Herniated disc 12/05/2011  . Seroma complicating a procedure 06/05/2011  . 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 Wound infection after surgery 05/22/2011  . Fall 04/30/2011  . Hip fracture, right (HCC) 04/30/2011  . CLOSED FRACTURE OF HEAD OF RADIUS 01/09/2010  . SUBLUXATION-RADIAL HEAD 01/09/2010    Past Surgical History:  Procedure Laterality Date  . ESOPHAGOGASTRODUODENOSCOPY N/A 07/09/2016   Procedure: ESOPHAGOGASTRODUODENOSCOPY (EGD);  Surgeon: Corbin Adeobert M Rourk, MD;  Location: [facility] SUITE;  Service: Endoscopy;  Laterality: N/A;  . HIP ARTHROPLASTY  05/01/2011   Procedure: right ARTHROPLASTY BIPOLAR HIP;  Surgeon: Fuller CanadaStanley Harrison, MD;  Location: [facility] ORS;  Service: Orthopedics;  Laterality: Right;  . INCISION AND DRAINAGE HIP  02/08/2012   Procedure: IRRIGATION AND DEBRIDEMENT HIP;  Surgeon: Vickki HearingStanley E Harrison, MD;  Location: [facility] ORS;  Service: Orthopedics;  Laterality: Right;  . INCISION AND DRAINAGE OF WOUND  05/24/2011   Procedure: IRRIGATION AND DEBRIDEMENT WOUND;  Surgeon: Fuller CanadaStanley Harrison, MD;  Location: [facility] ORS;  Service: Orthopedics;  Laterality: Right;    OB History  No data available       Home Medications    Prior to Admission medications   Medication Sig Start Date End Date Taking? Authorizing Provider  acetaminophen (TYLENOL) 325 MG tablet Take 650 mg by mouth every 6 (six) hours as needed for fever.    Yes [provider]  aspirin EC 81 MG tablet Take 81 mg by mouth daily.   Yes [provider]  atorvastatin (LIPITOR) 10 MG tablet Take 10 mg by mouth at bedtime.    Yes [provider]  buPROPion (WELLBUTRIN XL) 300 MG 24 hr tablet Take 300 mg by mouth daily.   Yes [provider]  calcium carbonate (OS-CAL) 600 MG TABS tablet Take 600 mg by mouth 2 (two) times daily with a meal.   Yes [provider]  chlorhexidine (PERIDEX) 0.12 % solution Use as directed 15 mLs in the mouth or throat every 12 (twelve) hours as needed (gum/mouth pain).    Yes [provider]  Cholecalciferol (VITAMIN D-3) 5000 units TABS Take 5,000 Units by mouth every 30 (thirty) days.    Yes [provider]  Dextromethorphan-Guaifenesin (MUCINEX DM) 30-600 MG TB12 Take 2 tablets by mouth every 12 (twelve) hours as needed (chest congestion).    Yes [provider]  dicyclomine (BENTYL) 10 MG capsule Take 1 capsule (10 mg total) by mouth 4 (four) times daily -  before meals and at bedtime. 05/04/11  Yes Vickki Hearing, MD  ferrous sulfate 325 (65 FE) MG tablet Take 325 mg by mouth 2 (two) times daily with a meal.    Yes [provider]  gabapentin (NEURONTIN) 400 MG capsule Take 400 mg by mouth every 8 (eight) hours. Hold for sedation or sleep   Yes [provider]  levothyroxine (SYNTHROID, LEVOTHROID) 50 MCG tablet Take 50 mcg by mouth daily before breakfast.    Yes [provider]  loperamide (IMODIUM) 2 MG capsule Take 2 mg by mouth every 6 (six) hours as needed for diarrhea or loose stools.    Yes [provider]  LORazepam (ATIVAN) 0.5 MG tablet Take 0.5-1 mg by mouth See admin instructions. Take 1 mg by mouth twice daily. Take 0.5 mg by mouth at bedtime   Yes [provider]  mirtazapine (REMERON) 45 MG tablet Take 45 mg by mouth at bedtime.   Yes [provider]  morphine (MS CONTIN) 30 MG 12 hr tablet Take 30 mg by mouth every 12 (twelve) hours.   Yes [provider]  OLANZapine (ZYPREXA) 15 MG tablet Take 15 mg by mouth at bedtime.   Yes [provider]  omeprazole (PRILOSEC) 20 MG capsule Take 20 mg by mouth daily.   Yes [provider]  oxybutynin (DITROPAN-XL) 5  MG 24 hr tablet Take 5 mg by mouth at bedtime.   Yes [provider]  Oxycodone HCl 10 MG TABS Take 1 tablet (10 mg total) by mouth every 6 (six) hours as needed (moderate to severe pain). 05/21/16  Yes Avon Gully, MD  polyvinyl alcohol (LIQUIFILM TEARS) 1.4 % ophthalmic solution Place 1 drop into both eyes daily as needed for dry eyes.    Yes [provider]  Probiotic CAPS Take 1 capsule by mouth daily.   Yes [provider]  senna-docusate (SENNA PLUS) 8.6-50 MG per tablet Take 2 tablets by mouth 2 (two) times daily.   Yes [provider]  vitamin C (ASCORBIC ACID) 500 MG tablet Take 500  mg by mouth 2 (two) times daily.    Yes [provider]  alum & mag hydroxide-simeth (MAALOX/MYLANTA) 200-200-20 MG/5ML suspension Take 30 mLs by mouth every 6 (six) hours as needed for indigestion.    [provider]  denosumab (PROLIA) 60 MG/ML SOLN injection Inject 60 mg into the skin every 6 (six) months. Administer in upper arm, thigh, or abdomen    [provider]    Family History No family history on file.  Social History Social History   Tobacco Use  . Smoking status: Never Smoker  . Smokeless tobacco: Never Used  Substance Use Topics  . Alcohol use: No  . Drug use: No     Allergies   Diphenhydramine and Tape   Review of Systems Review of Systems  Unable to perform ROS: Dementia     Physical Exam Updated Vital Signs BP 97/60   Pulse 65   Temp 98.2 F (36.8 C) (Oral)   Resp (!) 8   Ht 5\' 7"  (1.702 m)   Wt 54.4 kg (120 lb)   SpO2 98%   BMI 18.79 kg/m   Physical Exam  Constitutional: She is oriented to person, place, and time. She appears well-developed.  HENT:  Head: Normocephalic and atraumatic.  Eyes: EOM are normal.  Neck:  Patient has mid line C-spine tenderness or the lower part of her C spine  Cardiovascular: Normal rate.  Pulmonary/Chest: Effort normal.  Abdominal: Bowel sounds are normal.    Musculoskeletal:  Head to toe evaluation shows no hematoma, bleeding of the scalp, no facial abrasions, no spine step offs, crepitus of the chest or neck, no tenderness to palpation of the bilateral upper and lower extremities, no gross deformities, no chest tenderness, no pelvic pain.   Neurological: She is alert and oriented to person, place, and time.  Skin: Skin is warm and dry.  Patient has a 2 cm laceration to her scalp with surrounding hematoma.  Nursing note and vitals reviewed.    ED Treatments / Results  Labs (all labs ordered are listed, but only abnormal results are displayed) Labs Reviewed - No data to display  EKG  EKG Interpretation None       Radiology Ct Head Wo Contrast  Result Date: 04/11/2017 CLINICAL DATA:  Pt apparently fell at nursing home facility, has swelling to the back of her head with a small laceration. No apparent loc. Pt is awake and alert at this time, bleeding Is controlled EXAM: CT HEAD WITHOUT CONTRAST CT CERVICAL SPINE WITHOUT CONTRAST TECHNIQUE: Multidetector CT imaging of the head and cervical spine was performed following the standard protocol without intravenous contrast. Multiplanar CT image reconstructions of the cervical spine were also generated. COMPARISON:  11/07/2016 FINDINGS: CT HEAD FINDINGS Brain: There is central and cortical atrophy. Periventricular white matter changes are consistent with small vessel disease. There is no intra or extra-axial fluid collection or mass lesion. The basilar cisterns and ventricles have a normal appearance. There is no CT evidence for acute infarction or hemorrhage. Vascular: No hyperdense vessel or unexpected calcification. Skull: Normal. Negative for fracture or focal lesion. Sinuses/Orbits: Postoperative changes in the medial walls of the maxillary sinuses bilaterally. Other: Large left occipital scalp hematoma not associated with fracture. CT CERVICAL SPINE FINDINGS Alignment: Normal. Skull base and  vertebrae: No acute fracture. No primary bone lesion or focal pathologic process. Soft tissues and spinal canal: No prevertebral fluid or swelling. No visible canal hematoma. Disc levels:  Normal Upper chest: Stable biapical pleuroparenchymal  thickening over multiple prior studies (2015) Other: None IMPRESSION: 1.  No evidence for acute intracranial abnormality. 2. Atrophy and small vessel disease. 3. Large left occipital scalp hematoma not associated with fracture. 4.  No evidence for acute cervical spine abnormality. Electronically Signed   By: Norva Pavlov M.D.   On: 04/11/2017 22:11   Ct Cervical Spine Wo Contrast  Result Date: 04/11/2017 CLINICAL DATA:  Pt apparently fell at nursing home facility, has swelling to the back of her head with a small laceration. No apparent loc. Pt is awake and alert at this time, bleeding Is controlled EXAM: CT HEAD WITHOUT CONTRAST CT CERVICAL SPINE WITHOUT CONTRAST TECHNIQUE: Multidetector CT imaging of the head and cervical spine was performed following the standard protocol without intravenous contrast. Multiplanar CT image reconstructions of the cervical spine were also generated. COMPARISON:  11/07/2016 FINDINGS: CT HEAD FINDINGS Brain: There is central and cortical atrophy. Periventricular white matter changes are consistent with small vessel disease. There is no intra or extra-axial fluid collection or mass lesion. The basilar cisterns and ventricles have a normal appearance. There is no CT evidence for acute infarction or hemorrhage. Vascular: No hyperdense vessel or unexpected calcification. Skull: Normal. Negative for fracture or focal lesion. Sinuses/Orbits: Postoperative changes in the medial walls of the maxillary sinuses bilaterally. Other: Large left occipital scalp hematoma not associated with fracture. CT CERVICAL SPINE FINDINGS Alignment: Normal. Skull base and vertebrae: No acute fracture. No primary bone lesion or focal pathologic process. Soft tissues  and spinal canal: No prevertebral fluid or swelling. No visible canal hematoma. Disc levels:  Normal Upper chest: Stable biapical pleuroparenchymal thickening over multiple prior studies (2015) Other: None IMPRESSION: 1.  No evidence for acute intracranial abnormality. 2. Atrophy and small vessel disease. 3. Large left occipital scalp hematoma not associated with fracture. 4.  No evidence for acute cervical spine abnormality. Electronically Signed   By: Norva Pavlov M.D.   On: 04/11/2017 22:11    Procedures .Marland KitchenLaceration Repair Date/Time: 04/12/2017 12:44 AM Performed by: Derwood Kaplan, MD Authorized by: Derwood Kaplan, MD   Consent:    Consent obtained:  Verbal   Consent given by:  Patient   Risks discussed:  Pain and infection Anesthesia (see MAR for exact dosages):    Anesthesia method:  None Laceration details:    Location:  Scalp   Scalp location:  Occipital   Length (cm):  2   Depth (mm):  5 Repair type:    Repair type:  Simple Exploration:    Contaminated: no   Treatment:    Area cleansed with:  Soap and water   Amount of cleaning:  Standard Skin repair:    Repair method:  Staples   Number of staples:  4 Approximation:    Approximation:  Close   Vermilion border: well-aligned   Post-procedure details:    Dressing:  Adhesive bandage   Patient tolerance of procedure:  Tolerated well, no immediate complications Comments:     Patient had a gaping laceration which was stapled. Alignment is quite well, however there is some tenting from surrounding tissue.   (including critical care time)  Medications Ordered in ED Medications - No data to display   Initial Impression / Assessment and Plan / ED Course  I have reviewed the triage vital signs and the nursing notes.  Pertinent labs & imaging results that were available during my care of the patient were reviewed by me and considered in my medical decision making (see chart for details).  DDx includes: -  Mechanical falls - ICH - Fractures - Contusions - Soft tissue injury  Patient comes in after an unwitnessed fall.  Patient is not on any blood thinners.  Physical exam is positive for large scalp hematoma with laceration.  Patient also is having midline C-spine tenderness.  CT head and C-spine ordered.  Otherwise patient does not have any gross deformities or tenderness to palpation over the upper and lower extremities.  Patient has history of dementia and is not able to provide any meaningful history.  Final Clinical Impressions(s) / ED Diagnoses   Final diagnoses:  Laceration of scalp, initial encounter  Traumatic brain injury, without loss of consciousness, initial encounter [facility])    ED Discharge Orders    None       Derwood Kaplan, MD 04/12/17 [phone]

## 2017-04-15 ENCOUNTER — Encounter (HOSPITAL_COMMUNITY): Payer: Self-pay

## 2017-04-15 ENCOUNTER — Other Ambulatory Visit: Payer: Self-pay

## 2017-04-15 ENCOUNTER — Encounter (HOSPITAL_COMMUNITY)
Admission: RE | Admit: 2017-04-15 | Discharge: 2017-04-15 | Disposition: A | Discharge status: Home / Self Care | Payer: Medicare Other | Source: Ambulatory Visit | Attending: Internal Medicine | Admitting: Internal Medicine

## 2017-04-15 DIAGNOSIS — R1310 Dysphagia, unspecified: Secondary | ICD-10-CM | POA: Diagnosis present

## 2017-04-15 DIAGNOSIS — E039 Hypothyroidism, unspecified: Secondary | ICD-10-CM | POA: Diagnosis not present

## 2017-04-15 DIAGNOSIS — Z79899 Other long term (current) drug therapy: Secondary | ICD-10-CM | POA: Diagnosis not present

## 2017-04-15 DIAGNOSIS — M109 Gout, unspecified: Secondary | ICD-10-CM | POA: Diagnosis not present

## 2017-04-15 DIAGNOSIS — K219 Gastro-esophageal reflux disease without esophagitis: Secondary | ICD-10-CM | POA: Diagnosis not present

## 2017-04-15 DIAGNOSIS — F0390 Unspecified dementia without behavioral disturbance: Secondary | ICD-10-CM | POA: Diagnosis not present

## 2017-04-15 DIAGNOSIS — Z86718 Personal history of other venous thrombosis and embolism: Secondary | ICD-10-CM | POA: Diagnosis not present

## 2017-04-15 DIAGNOSIS — E876 Hypokalemia: Secondary | ICD-10-CM | POA: Diagnosis not present

## 2017-04-15 DIAGNOSIS — M810 Age-related osteoporosis without current pathological fracture: Secondary | ICD-10-CM | POA: Diagnosis not present

## 2017-04-15 DIAGNOSIS — F419 Anxiety disorder, unspecified: Secondary | ICD-10-CM | POA: Diagnosis not present

## 2017-04-15 DIAGNOSIS — F329 Major depressive disorder, single episode, unspecified: Secondary | ICD-10-CM | POA: Diagnosis not present

## 2017-04-15 DIAGNOSIS — K449 Diaphragmatic hernia without obstruction or gangrene: Secondary | ICD-10-CM | POA: Diagnosis not present

## 2017-04-15 DIAGNOSIS — K589 Irritable bowel syndrome without diarrhea: Secondary | ICD-10-CM | POA: Diagnosis not present

## 2017-04-15 DIAGNOSIS — E785 Hyperlipidemia, unspecified: Secondary | ICD-10-CM | POA: Diagnosis not present

## 2017-04-15 LAB — CBC WITH DIFFERENTIAL/PLATELET
Basophils Absolute: 0 10*3/uL (ref 0.0–0.1)
Basophils Relative: 0 %
Eosinophils Absolute: 0.2 10*3/uL (ref 0.0–0.7)
Eosinophils Relative: 3 %
HCT: 35.8 % — ABNORMAL LOW (ref 36.0–46.0)
Hemoglobin: 11 g/dL — ABNORMAL LOW (ref 12.0–15.0)
Lymphocytes Relative: 16 %
Lymphs Abs: 1.1 10*3/uL (ref 0.7–4.0)
MCH: 27.2 pg (ref 26.0–34.0)
MCHC: 30.7 g/dL (ref 30.0–36.0)
MCV: 88.6 fL (ref 78.0–100.0)
Monocytes Absolute: 0.5 10*3/uL (ref 0.1–1.0)
Monocytes Relative: 6 %
Neutro Abs: 5.2 10*3/uL (ref 1.7–7.7)
Neutrophils Relative %: 75 %
Platelets: 232 10*3/uL (ref 150–400)
RBC: 4.04 MIL/uL (ref 3.87–5.11)
RDW: 15.1 % (ref 11.5–15.5)
WBC: 7 10*3/uL (ref 4.0–10.5)

## 2017-04-15 LAB — BASIC METABOLIC PANEL
Anion gap: 10 (ref 5–15)
BUN: 13 mg/dL (ref 6–20)
CO2: 26 mmol/L (ref 22–32)
Calcium: 9.1 mg/dL (ref 8.9–10.3)
Chloride: 106 mmol/L (ref 101–111)
Creatinine, Ser: 0.79 mg/dL (ref 0.44–1.00)
GFR calc Af Amer: >60 mL/min (ref >60)
GFR calc non Af Amer: >60 mL/min (ref >60)
Glucose, Bld: 111 mg/dL — ABNORMAL HIGH (ref 65–99)
Potassium: 3.2 mmol/L — ABNORMAL LOW (ref 3.5–5.1)
Sodium: 142 mmol/L (ref 135–145)

## 2017-04-15 NOTE — Pre-Procedure Instructions (Addendum)
Patient here for PAT. [name] is listed as legal guardian. Called Laural Benesthe number provided for him [phone], and the office was closed for the Holiday. Called the emergency number [phone], and spoke to Sebastian AcheKimberly Squire who was on call. She took my information and called me back to give verbal consent to do patient's blood work. She states she gave my information to Irene Paporothy Barker who will call back to give telephone consent for procedure.

## 2017-04-17 NOTE — Progress Notes (Signed)
Covering another provider's box: potassium is just mildly low at 3.2. I contacted [facility], but they are unable to give patient potassium as it has to be ordered and would not be in until after the procedure. Attempted to call Anesthesia as an FYI, but all have left for the day. I do not anticipate this should be an issue as it is mild. However, will attempt to call first thing on 1/24 to review with Dr. Jayme CloudGonzalez as this will be done with Propofol.

## 2017-04-17 NOTE — Pre-Procedure Instructions (Signed)
Called and left message for Irene PapDorothy Barker to call us back so we could get telephone consent for patient to have her procedure tomorrow. I did tell her if we did not receive this consent today, we would have to cancel patient's procedure.

## 2017-04-17 NOTE — Pre-Procedure Instructions (Signed)
04/16/2017, late entry. Rae Lipsracey Green attempted to call Irene PapDorothy Barker to get telephone consent for procedure. She left message for her to call back and did not hear from her.

## 2017-04-18 ENCOUNTER — Encounter (HOSPITAL_COMMUNITY): Payer: Self-pay | Admitting: *Deleted

## 2017-04-18 ENCOUNTER — Ambulatory Visit (HOSPITAL_COMMUNITY): Payer: Medicare Other | Admitting: Anesthesiology

## 2017-04-18 ENCOUNTER — Encounter (HOSPITAL_COMMUNITY)
Admission: RE | Disposition: A | Discharge status: Skilled Nursing Facility | Payer: Self-pay | Source: Ambulatory Visit | Attending: Internal Medicine

## 2017-04-18 ENCOUNTER — Ambulatory Visit (HOSPITAL_COMMUNITY)
Admission: RE | Admit: 2017-04-18 | Discharge: 2017-04-18 | Disposition: A | Discharge status: Skilled Nursing Facility | Payer: Medicare Other | Source: Ambulatory Visit | Attending: Internal Medicine | Admitting: Internal Medicine

## 2017-04-18 DIAGNOSIS — E876 Hypokalemia: Secondary | ICD-10-CM | POA: Diagnosis not present

## 2017-04-18 DIAGNOSIS — F419 Anxiety disorder, unspecified: Secondary | ICD-10-CM | POA: Insufficient documentation

## 2017-04-18 DIAGNOSIS — K449 Diaphragmatic hernia without obstruction or gangrene: Secondary | ICD-10-CM | POA: Diagnosis not present

## 2017-04-18 DIAGNOSIS — K219 Gastro-esophageal reflux disease without esophagitis: Secondary | ICD-10-CM | POA: Diagnosis not present

## 2017-04-18 DIAGNOSIS — M109 Gout, unspecified: Secondary | ICD-10-CM | POA: Insufficient documentation

## 2017-04-18 DIAGNOSIS — Z79899 Other long term (current) drug therapy: Secondary | ICD-10-CM | POA: Insufficient documentation

## 2017-04-18 DIAGNOSIS — Z86718 Personal history of other venous thrombosis and embolism: Secondary | ICD-10-CM | POA: Insufficient documentation

## 2017-04-18 DIAGNOSIS — E039 Hypothyroidism, unspecified: Secondary | ICD-10-CM | POA: Insufficient documentation

## 2017-04-18 DIAGNOSIS — R1310 Dysphagia, unspecified: Secondary | ICD-10-CM | POA: Diagnosis not present

## 2017-04-18 DIAGNOSIS — M810 Age-related osteoporosis without current pathological fracture: Secondary | ICD-10-CM | POA: Insufficient documentation

## 2017-04-18 DIAGNOSIS — F0390 Unspecified dementia without behavioral disturbance: Secondary | ICD-10-CM | POA: Insufficient documentation

## 2017-04-18 DIAGNOSIS — K589 Irritable bowel syndrome without diarrhea: Secondary | ICD-10-CM | POA: Insufficient documentation

## 2017-04-18 DIAGNOSIS — E785 Hyperlipidemia, unspecified: Secondary | ICD-10-CM | POA: Insufficient documentation

## 2017-04-18 DIAGNOSIS — F329 Major depressive disorder, single episode, unspecified: Secondary | ICD-10-CM | POA: Insufficient documentation

## 2017-04-18 HISTORY — PX: MALONEY DILATION: SHX5535

## 2017-04-18 HISTORY — PX: ESOPHAGOGASTRODUODENOSCOPY (EGD) WITH PROPOFOL: SHX5813

## 2017-04-18 LAB — BASIC METABOLIC PANEL
ANION GAP: 10 (ref 5–15)
BUN: 16 mg/dL (ref 6–20)
CO2: 28 mmol/L (ref 22–32)
CREATININE: 0.76 mg/dL (ref 0.44–1.00)
Calcium: 9 mg/dL (ref 8.9–10.3)
Chloride: 104 mmol/L (ref 101–111)
Glucose, Bld: 81 mg/dL (ref 65–99)
Potassium: 3.5 mmol/L (ref 3.5–5.1)
Sodium: 142 mmol/L (ref 135–145)

## 2017-04-18 SURGERY — ESOPHAGOGASTRODUODENOSCOPY (EGD) WITH PROPOFOL
Anesthesia: Monitor Anesthesia Care

## 2017-04-18 MED ORDER — MIDAZOLAM HCL 2 MG/2ML IJ SOLN
INTRAMUSCULAR | Status: AC
  Filled 2017-04-18: qty 2

## 2017-04-18 MED ORDER — PROPOFOL 10 MG/ML IV BOLUS
INTRAVENOUS | Status: DC | PRN
  Administered 2017-04-18: 10 mg via INTRAVENOUS

## 2017-04-18 MED ORDER — LACTATED RINGERS IV SOLN
INTRAVENOUS | Status: DC
  Administered 2017-04-18: 10:00:00 via INTRAVENOUS

## 2017-04-18 MED ORDER — LIDOCAINE VISCOUS 2 % MT SOLN
5.0000 mL | Freq: Once | OROMUCOSAL | Status: AC
  Administered 2017-04-18: 5 mL via OROMUCOSAL

## 2017-04-18 MED ORDER — LIDOCAINE VISCOUS 2 % MT SOLN
OROMUCOSAL | Status: AC
  Filled 2017-04-18: qty 15

## 2017-04-18 MED ORDER — MIDAZOLAM HCL 2 MG/2ML IJ SOLN
1.0000 mg | INTRAMUSCULAR | Status: AC
  Administered 2017-04-18 (×2): 1 mg via INTRAVENOUS

## 2017-04-18 MED ORDER — PROPOFOL 10 MG/ML IV BOLUS
INTRAVENOUS | Status: AC
  Filled 2017-04-18: qty 40

## 2017-04-18 MED ORDER — PROPOFOL 500 MG/50ML IV EMUL
INTRAVENOUS | Status: DC | PRN
  Administered 2017-04-18: 50 ug/kg/min via INTRAVENOUS

## 2017-04-18 NOTE — Progress Notes (Signed)
Contacted Pam Shreve in endo. As patient will be there at hospital today, can collect repeat BMP and order under my name today.

## 2017-04-18 NOTE — Anesthesia Postprocedure Evaluation (Signed)
Anesthesia Post Note  Patient: [name]  Procedure(s) Performed: ESOPHAGOGASTRODUODENOSCOPY (EGD) WITH PROPOFOL (N/A ) MALONEY DILATION (N/A )  Patient location during evaluation: PACU Anesthesia Type: MAC Level of consciousness: awake and alert and oriented Pain management: pain level controlled Vital Signs Assessment: post-procedure vital signs reviewed and stable Respiratory status: spontaneous breathing Cardiovascular status: stable and blood pressure returned to baseline Postop Assessment: no apparent nausea or vomiting Anesthetic complications: no     Last Vitals: There were no vitals filed for this visit.  Last Pain: There were no vitals filed for this visit.               Shayleen Eppinger

## 2017-04-18 NOTE — Transfer of Care (Signed)
Immediate Anesthesia Transfer of Care Note  Patient: [name]  Procedure(s) Performed: ESOPHAGOGASTRODUODENOSCOPY (EGD) WITH PROPOFOL (N/A ) MALONEY DILATION (N/A )  Patient Location: PACU  Anesthesia Type:MAC  Level of Consciousness: awake and alert   Airway & Oxygen Therapy: Patient Spontanous Breathing  Post-op Assessment: Report given to RN  Post vital signs: Reviewed and stable  Last Vitals: There were no vitals filed for this visit.  Last Pain: There were no vitals filed for this visit.       Complications: No apparent anesthesia complications

## 2017-04-18 NOTE — Op Note (Signed)
[facility] Patient Name: [name] Procedure Date: 04/18/2017 8:47 AM MRN: [id number] Date of Birth: 10/01/1945 Attending MD: Gennette Pac , MD CSN: [id number] Age: 72 Admit Type: Outpatient Procedure:                Upper GI endoscopy Indications:              Dysphagia Providers:                Gennette Pac, MD, Nena Polio, RN, Dyann Ruddle Referring MD:              Medicines:                Propofol per Anesthesia Complications:            No immediate complications. Estimated Blood Loss:     Estimated blood loss was minimal. Procedure:                Pre-Anesthesia Assessment:                           - Prior to the procedure, a History and Physical                            was performed, and patient medications and                            allergies were reviewed. The patient's tolerance of                            previous anesthesia was also reviewed. The risks                            and benefits of the procedure and the sedation                            options and risks were discussed with the patient.                            All questions were answered, and informed consent                            was obtained. Prior Anticoagulants: The patient has                            taken no previous anticoagulant or antiplatelet                            agents. ASA Grade Assessment: III - A patient with                            severe systemic disease. After reviewing the risks  and benefits, the patient was deemed in                            satisfactory condition to undergo the procedure.                           After obtaining informed consent, the endoscope was                            passed under direct vision. Throughout the                            procedure, the patient's blood pressure, pulse, and                            oxygen saturations were monitored  continuously. The                            [id number]) was introduced through the mouth,                            and advanced to the second part of duodenum. The                            upper GI endoscopy was accomplished without                            difficulty. The patient tolerated the procedure                            well. The upper GI endoscopy was accomplished                            without difficulty. The patient tolerated the                            procedure well. Scope In: 10:47:36 AM Scope Out: 10:56:03 AM Total Procedure Duration: 0 hours 8 minutes 27 seconds  Findings:      The examined esophagus was normal. .      A large hiatal hernia was present.      The duodenal bulb and second portion of the duodenum were normal. The       scope was withdrawn. Dilation was performed with a Maloney dilator with       no resistance at 54 Fr. The scope was withdrawn. Dilation was performed       with a Maloney dilator with mild resistance at 56 Fr. The dilation site       was examined following endoscope reinsertion and showed mild improvement       in luminal narrowing. Estimated blood loss was minimal Impression:               - Normal esophagus. Dilated. Cannot rule out a                            subtle cervical esophageal web.                           -  Large hiatal hernia.                           - Normal duodenal bulb and second portion of the                            duodenum.                           - No specimens collected. Moderate Sedation:      Moderate (conscious) sedation was personally administered by an       anesthesia professional. The following parameters were monitored: oxygen       saturation, heart rate, blood pressure, respiratory rate, EKG, adequacy       of pulmonary ventilation, and response to care. Total physician       intraservice time was 18 minutes. Recommendation:           - Patient has a contact number available  for                            emergencies. The signs and symptoms of potential                            delayed complications were discussed with the                            patient. Return to normal activities tomorrow.                            Written discharge instructions were provided to the                            patient.                           - Resume previous diet.                           - Continue present medications. Continue omeprazole                            20 mg daily.                           - No repeat upper endoscopy.                           - Return to GI office in 3 months. Procedure Code(s):        --- Professional ---                           [phone], Esophagogastroduodenoscopy, flexible,                            transoral; diagnostic, including collection of  specimen(s) by brushing or washing, when performed                            (separate procedure)                           43450, Dilation of esophagus, by unguided sound or                            bougie, single or multiple passes Diagnosis Code(s):        --- Professional ---                           K44.9, Diaphragmatic hernia without obstruction or                            gangrene                           R13.10, Dysphagia, unspecified CPT copyright 2016 [employer]. All rights reserved. The codes documented in this report are preliminary and upon coder review may  be revised to meet current compliance requirements. [name], MD Gennette Pac, MD 04/18/2017 11:03:47 AM This report has been signed electronically. Number of Addenda: 0

## 2017-04-18 NOTE — Anesthesia Preprocedure Evaluation (Signed)
Anesthesia Evaluation  Patient identified by MRN, date of birth, ID band Patient awake    Reviewed: Allergy & Precautions, H&P , NPO status , Patient's Chart, lab work & pertinent test results  History of Anesthesia Complications (+) PONVNegative for: history of anesthetic complications  Airway Mallampati: II       Dental  (+) Edentulous Upper, Edentulous Lower   Pulmonary neg pulmonary ROS,    breath sounds clear to auscultation       Cardiovascular negative cardio ROS   Rhythm:Regular Rate:Normal     Neuro/Psych PSYCHIATRIC DISORDERS (dementia) Anxiety Depression    GI/Hepatic GERD  Controlled and Medicated,  Endo/Other  Hypothyroidism   Renal/GU      Musculoskeletal   Abdominal   Peds  Hematology   Anesthesia Other Findings   Reproductive/Obstetrics                             Anesthesia Physical Anesthesia Plan  ASA: III  Anesthesia Plan: MAC   Post-op Pain Management:    Induction: Intravenous  PONV Risk Score and Plan:   Airway Management Planned: Simple Face Mask  Additional Equipment:   Intra-op Plan:   Post-operative Plan:   Informed Consent: I have reviewed the patients History and Physical, chart, labs and discussed the procedure including the risks, benefits and alternatives for the proposed anesthesia with the patient or authorized representative who has indicated his/her understanding and acceptance.     Plan Discussed with:   Anesthesia Plan Comments:         Anesthesia Quick Evaluation

## 2017-04-18 NOTE — H&P (Signed)
@LOGO @   Primary Care Physician:  Charlynne Pander, MD Primary Gastroenterologist:  Dr. Jena Gauss  Pre-Procedure History & Physical: HPI:  [name] is a 72 y.o. female here for here for further evaluation of esophageal dysphagia via EGD. Recent food impaction. Anticoagulation has been stopped.  Past Medical History:  Diagnosis Date  . Anemia   . Anemia   . Anxiety   . Anxiety and depression   . Bacteremia   . Cellulitis   . Chronic back pain   . Chronic diarrhea   . Chronic right hip pain   . Complication of anesthesia   . Depression   . DVT (deep venous thrombosis) (HCC)   . Dysphagia   . GERD (gastroesophageal reflux disease)   . Gout   . Gout   . Hyperlipidemia   . Hypopotassemia   . Hypothyroidism   . IBS (irritable bowel syndrome)   . Major depressive disorder   . Muscle weakness (generalized)   . Osteoporosis   . Pneumonia   . PONV (postoperative nausea and vomiting)   . Pressure ulcer   . Pressure ulcer of buttock   . Psychosis (HCC)   . Thyroid disease    hypothyroidism    Past Surgical History:  Procedure Laterality Date  . ESOPHAGOGASTRODUODENOSCOPY N/A 07/09/2016   Procedure: ESOPHAGOGASTRODUODENOSCOPY (EGD);  Surgeon: Corbin Ade, MD;  Location: [facility];  Service: Endoscopy;  Laterality: N/A;  . HIP ARTHROPLASTY  05/01/2011   Procedure: right ARTHROPLASTY BIPOLAR HIP;  Surgeon: Fuller Canada, MD;  Location: [facility];  Service: Orthopedics;  Laterality: Right;  . INCISION AND DRAINAGE HIP  02/08/2012   Procedure: IRRIGATION AND DEBRIDEMENT HIP;  Surgeon: Vickki Hearing, MD;  Location: [facility];  Service: Orthopedics;  Laterality: Right;  . INCISION AND DRAINAGE OF WOUND  05/24/2011   Procedure: IRRIGATION AND DEBRIDEMENT WOUND;  Surgeon: Fuller Canada, MD;  Location: [facility];  Service: Orthopedics;  Laterality: Right;    Prior to Admission medications   Medication Sig Start Date End Date Taking? Authorizing Provider  acetaminophen (TYLENOL)  325 MG tablet Take 650 mg by mouth every 6 (six) hours as needed for fever.    Yes [provider]  alum & mag hydroxide-simeth (MAALOX/MYLANTA) 200-200-20 MG/5ML suspension Take 30 mLs by mouth every 6 (six) hours as needed for indigestion.   Yes [provider]  aspirin EC 81 MG tablet Take 81 mg by mouth daily.   Yes [provider]  atorvastatin (LIPITOR) 10 MG tablet Take 10 mg by mouth at bedtime.    Yes [provider]  buPROPion (WELLBUTRIN XL) 300 MG 24 hr tablet Take 300 mg by mouth daily.   Yes [provider]  calcium carbonate (OS-CAL) 600 MG TABS tablet Take 600 mg by mouth 2 (two) times daily with a meal.   Yes [provider]  chlorhexidine (PERIDEX) 0.12 % solution Use as directed 15 mLs in the mouth or throat every 12 (twelve) hours as needed (gum/mouth pain).    Yes [provider]  Cholecalciferol (VITAMIN D-3) 5000 units TABS Take 5,000 Units by mouth every 30 (thirty) days.    Yes [provider]  Dextromethorphan-Guaifenesin (MUCINEX DM) 30-600 MG TB12 Take 2 tablets by mouth every 12 (twelve) hours as needed (chest congestion).    Yes [provider]  dicyclomine (BENTYL) 10 MG capsule Take 1 capsule (10 mg total) by mouth 4 (four) times daily -  before meals and at bedtime. 05/04/11  Yes Vickki HearingHarrison, Stanley E, MD  ferrous sulfate 325 (65 FE) MG tablet Take 325 mg by mouth 2 (two) times daily with a meal.    Yes [provider]  gabapentin (NEURONTIN) 400 MG capsule Take 400 mg by mouth every 8 (eight) hours. Hold for sedation or sleep   Yes [provider]  levothyroxine (SYNTHROID, LEVOTHROID) 50 MCG tablet Take 50 mcg by mouth daily before breakfast.    Yes [provider]  loperamide (IMODIUM) 2 MG capsule Take 2 mg by mouth every 6 (six) hours as needed for diarrhea or loose stools.    Yes [provider]  LORazepam (ATIVAN) 0.5 MG tablet Take 0.5-1 mg by mouth  See admin instructions. Take 1 mg by mouth twice daily. Take 0.5 mg by mouth at bedtime   Yes [provider]  mirtazapine (REMERON) 45 MG tablet Take 45 mg by mouth at bedtime.   Yes [provider]  morphine (MS CONTIN) 30 MG 12 hr tablet Take 30 mg by mouth every 12 (twelve) hours.   Yes [provider]  OLANZapine (ZYPREXA) 15 MG tablet Take 15 mg by mouth at bedtime.   Yes [provider]  omeprazole (PRILOSEC) 20 MG capsule Take 20 mg by mouth daily.   Yes [provider]  oxybutynin (DITROPAN-XL) 5 MG 24 hr tablet Take 5 mg by mouth at bedtime.   Yes [provider]  Oxycodone HCl 10 MG TABS Take 1 tablet (10 mg total) by mouth every 6 (six) hours as needed (moderate to severe pain). 05/21/16  Yes Avon GullyFanta, Tesfaye, MD  polyvinyl alcohol (LIQUIFILM TEARS) 1.4 % ophthalmic solution Place 1 drop into both eyes daily as needed for dry eyes.    Yes [provider]  Probiotic CAPS Take 1 capsule by mouth daily.   Yes [provider]  senna-docusate (SENNA PLUS) 8.6-50 MG per tablet Take 2 tablets by mouth 2 (two) times daily.   Yes [provider]  vitamin C (ASCORBIC ACID) 500 MG tablet Take 500 mg by mouth 2 (two) times daily.    Yes [provider]  denosumab (PROLIA) 60 MG/ML SOLN injection Inject 60 mg into the skin every 6 (six) months. Administer in upper arm, thigh, or abdomen    [provider]    Allergies as of 03/07/2017 - Review Complete 03/06/2017  Allergen Reaction Noted  . Diphenhydramine Other (See Comments) 04/30/2011  . Tape Dermatitis 11/20/2013    History reviewed. No pertinent family history.  Social History   Socioeconomic History  . Marital status: Single    Spouse name: Not on file  . Number of children: Not on file  . Years of education: Not on file  . Highest education level: Not on file  Social Needs  . Financial resource strain: Not on file  . Food insecurity -  worry: Not on file  . Food insecurity - inability: Not on file  . Transportation needs - medical: Not on file  . Transportation needs - non-medical: Not on file  Occupational History  . Not on file  Tobacco Use  . Smoking status: Never Smoker  . Smokeless tobacco: Never Used  Substance and Sexual Activity  . Alcohol use: No  . Drug use: No  . Sexual activity: No  Other Topics Concern  . Not on file  Social History Narrative   ** Merged History Encounter **       Patient's legal guardian is Mr. Zebedee IbaRon Johnson. Phone  number [phone].    Review of Systems: See HPI, otherwise negative ROS  Physical Exam: There were no vitals taken for this visit. General:   Alert, edentulous, chronically ill lady. Nose:  No deformity, discharge,  or lesions. Mouth:  No deformity or lesions. Neck:  Supple; no masses or thyromegaly. No significant cervical adenopathy. Lungs:  Clear throughout to auscultation.   No wheezes, crackles, or rhonchi. No acute distress. Heart:  Regular rate and rhythm; no murmurs, clicks, rubs,  or gallops. Abdomen: Non-distended, normal bowel sounds.  Soft and nontender without appreciable mass or hepatosplenomegaly.  Pulses:  Normal pulses noted. Extremities:  Without clubbing or edema.   Impression:    72 year old lady with recurrent esophageal dysphagia. Further evaluation warranted via EGD.   Recommendations: I have offered the patient an EGD with esophageal dilation as feasible/appropriate per plan. Anticoagulation has been held.  The risks, benefits, limitations, alternatives and imponderables have been reviewed with the patient. Questions have been answered. All parties are agreeable.     Notice: This dictation was prepared with Dragon dictation along with smaller phrase technology. Any transcriptional errors that result from this process are unintentional and may not be corrected upon review.

## 2017-04-18 NOTE — Discharge Instructions (Addendum)
EGD Discharge instructions Please read the instructions outlined below and refer to this sheet in the next few weeks. These discharge instructions provide you with general information on caring for yourself after you leave the hospital. Your doctor may also give you specific instructions. While your treatment has been planned according to the most current medical practices available, unavoidable complications occasionally occur. If you have any problems or questions after discharge, please call your doctor. ACTIVITY  You may resume your regular activity but move at a slower pace for the next 24 hours.   Take frequent rest periods for the next 24 hours.   Walking will help expel (get rid of) the air and reduce the bloated feeling in your abdomen.   No driving for 24 hours (because of the anesthesia (medicine) used during the test).   You may shower.   Do not sign any important legal documents or operate any machinery for 24 hours (because of the anesthesia used during the test).  NUTRITION  Drink plenty of fluids.   You may resume your normal diet.   Begin with a light meal and progress to your normal diet.   Avoid alcoholic beverages for 24 hours or as instructed by your caregiver.  MEDICATIONS  You may resume your normal medications unless your caregiver tells you otherwise.  WHAT YOU CAN EXPECT TODAY  You may experience abdominal discomfort such as a feeling of fullness or gas pains.  FOLLOW-UP  Your doctor will discuss the results of your test with you.  SEEK IMMEDIATE MEDICAL ATTENTION IF ANY OF THE FOLLOWING OCCUR:  Excessive nausea (feeling sick to your stomach) and/or vomiting.   Severe abdominal pain and distention (swelling).   Trouble swallowing.   Temperature over 101 F (37.8 C).   Rectal bleeding or vomiting of blood.    GERD information provided  Continue omeprazole 20 mg daily  Office visit with us in 3 months      Gastroesophageal Reflux  Disease, Adult Normally, food travels down the esophagus and stays in the stomach to be digested. If a person has gastroesophageal reflux disease (GERD), food and stomach acid move back up into the esophagus. When this happens, the esophagus becomes sore and swollen (inflamed). Over time, GERD can make small holes (ulcers) in the lining of the esophagus. Follow these instructions at home: Diet  Follow a diet as told by your doctor. You may need to avoid foods and drinks such as: ? Coffee and tea (with or without caffeine). ? Drinks that contain alcohol. ? Energy drinks and sports drinks. ? Carbonated drinks or sodas. ? Chocolate and cocoa. ? Peppermint and mint flavorings. ? Garlic and onions. ? Horseradish. ? Spicy and acidic foods, such as peppers, chili powder, curry powder, vinegar, hot sauces, and BBQ sauce. ? Citrus fruit juices and citrus fruits, such as oranges, lemons, and limes. ? Tomato-based foods, such as red sauce, chili, salsa, and pizza with red sauce. ? Fried and fatty foods, such as donuts, french fries, potato chips, and high-fat dressings. ? High-fat meats, such as hot dogs, rib eye steak, sausage, ham, and bacon. ? High-fat dairy items, such as whole milk, butter, and cream cheese.  Eat small meals often. Avoid eating large meals.  Avoid drinking large amounts of liquid with your meals.  Avoid eating meals during the 2-3 hours before bedtime.  Avoid lying down right after you eat.  Do not exercise right after you eat. General instructions  Pay attention to any changes in  your symptoms.  Take over-the-counter and prescription medicines only as told by your doctor. Do not take aspirin, ibuprofen, or other NSAIDs unless your doctor says it is okay.  Do not use any tobacco products, including cigarettes, chewing tobacco, and e-cigarettes. If you need help quitting, ask your doctor.  Wear loose clothes. Do not wear anything tight around your waist.  Raise  (elevate) the head of your bed about 6 inches (15 cm).  Try to lower your stress. If you need help doing this, ask your doctor.  If you are overweight, lose an amount of weight that is healthy for you. Ask your doctor about a safe weight loss goal.  Keep all follow-up visits as told by your doctor. This is important. Contact a doctor if:  You have new symptoms.  You lose weight and you do not know why it is happening.  You have trouble swallowing, or it hurts to swallow.  You have wheezing or a cough that keeps happening.  Your symptoms do not get better with treatment.  You have a hoarse voice. Get help right away if:  You have pain in your arms, neck, jaw, teeth, or back.  You feel sweaty, dizzy, or light-headed.  You have chest pain or shortness of breath.  You throw up (vomit) and your throw up looks like blood or coffee grounds.  You pass out (faint).  Your poop (stool) is bloody or black.  You cannot swallow, drink, or eat. This information is not intended to replace advice given to you by your health care provider. Make sure you discuss any questions you have with your health care provider. Document Released: 08/29/2007 Document Revised: 08/18/2015 Document Reviewed: 07/07/2014 Elsevier Interactive Patient Education  2018 [employer].    PATIENT INSTRUCTIONS POST-ANESTHESIA  IMMEDIATELY FOLLOWING SURGERY:  Do not drive or operate machinery for the first twenty four hours after surgery.  Do not make any important decisions for twenty four hours after surgery or while taking narcotic pain medications or sedatives.  If you develop intractable nausea and vomiting or a severe headache please notify your doctor immediately.  FOLLOW-UP:  Please make an appointment with your surgeon as instructed. You do not need to follow up with anesthesia unless specifically instructed to do so.  WOUND CARE INSTRUCTIONS (if applicable):  Keep a dry clean dressing on the  anesthesia/puncture wound site if there is drainage.  Once the wound has quit draining you may leave it open to air.  Generally you should leave the bandage intact for twenty four hours unless there is drainage.  If the epidural site drains for more than 36-48 hours please call the anesthesia department.  QUESTIONS?:  Please feel free to call your physician or the hospital operator if you have any questions, and they will be happy to assist you.

## 2017-04-18 NOTE — Progress Notes (Signed)
I discussed this with anesthesia. No issues with proceeding with EGD. HOWEVER:  this potassium needs to be rechecked. Please call the facility she stays at (I believe [facility]) and see if they have had any labs done since 1/21, and if not, needs BMP TODAY and faxed to us.

## 2017-04-19 NOTE — Progress Notes (Signed)
Potassium is now normal at 3.5 but low normal. Please fax to the facility that she stays at. They may want to keep an eye on this. Please make sure to make contact with nursing to let them know about this lab.

## 2017-04-22 ENCOUNTER — Encounter (HOSPITAL_COMMUNITY): Payer: Self-pay | Admitting: Internal Medicine

## 2017-04-23 ENCOUNTER — Emergency Department (HOSPITAL_COMMUNITY)
Admission: EM | Admit: 2017-04-23 | Discharge: 2017-04-23 | Disposition: A | Discharge status: Home / Self Care | Payer: Medicare Other | Attending: Emergency Medicine | Admitting: Emergency Medicine

## 2017-04-23 ENCOUNTER — Emergency Department (HOSPITAL_COMMUNITY): Payer: Medicare Other

## 2017-04-23 ENCOUNTER — Encounter (HOSPITAL_COMMUNITY): Payer: Self-pay

## 2017-04-23 ENCOUNTER — Other Ambulatory Visit: Payer: Self-pay

## 2017-04-23 DIAGNOSIS — Z96641 Presence of right artificial hip joint: Secondary | ICD-10-CM | POA: Insufficient documentation

## 2017-04-23 DIAGNOSIS — Z79899 Other long term (current) drug therapy: Secondary | ICD-10-CM | POA: Diagnosis not present

## 2017-04-23 DIAGNOSIS — S0101XA Laceration without foreign body of scalp, initial encounter: Secondary | ICD-10-CM

## 2017-04-23 DIAGNOSIS — I959 Hypotension, unspecified: Secondary | ICD-10-CM | POA: Insufficient documentation

## 2017-04-23 DIAGNOSIS — G894 Chronic pain syndrome: Secondary | ICD-10-CM | POA: Diagnosis not present

## 2017-04-23 DIAGNOSIS — Z7982 Long term (current) use of aspirin: Secondary | ICD-10-CM | POA: Insufficient documentation

## 2017-04-23 DIAGNOSIS — E039 Hypothyroidism, unspecified: Secondary | ICD-10-CM | POA: Diagnosis not present

## 2017-04-23 DIAGNOSIS — W19XXXA Unspecified fall, initial encounter: Secondary | ICD-10-CM | POA: Diagnosis not present

## 2017-04-23 DIAGNOSIS — S0990XA Unspecified injury of head, initial encounter: Secondary | ICD-10-CM

## 2017-04-23 DIAGNOSIS — Y939 Activity, unspecified: Secondary | ICD-10-CM | POA: Diagnosis not present

## 2017-04-23 DIAGNOSIS — Y929 Unspecified place or not applicable: Secondary | ICD-10-CM | POA: Insufficient documentation

## 2017-04-23 DIAGNOSIS — Y999 Unspecified external cause status: Secondary | ICD-10-CM | POA: Insufficient documentation

## 2017-04-23 LAB — URINALYSIS, ROUTINE W REFLEX MICROSCOPIC
BILIRUBIN URINE: NEGATIVE
Glucose, UA: NEGATIVE mg/dL
HGB URINE DIPSTICK: NEGATIVE
Ketones, ur: NEGATIVE mg/dL
Leukocytes, UA: NEGATIVE
NITRITE: NEGATIVE
Protein, ur: NEGATIVE mg/dL
SPECIFIC GRAVITY, URINE: 1.01 (ref 1.005–1.030)
pH: 6.5 (ref 5.0–8.0)

## 2017-04-23 LAB — I-STAT CHEM 8, ED
BUN: 13 mg/dL (ref 6–20)
CALCIUM ION: 1.17 mmol/L (ref 1.15–1.40)
CHLORIDE: 100 mmol/L — AB (ref 101–111)
Creatinine, Ser: 0.8 mg/dL (ref 0.44–1.00)
GLUCOSE: 96 mg/dL (ref 65–99)
HCT: 33 % — ABNORMAL LOW (ref 36.0–46.0)
HEMOGLOBIN: 11.2 g/dL — AB (ref 12.0–15.0)
Potassium: 4.4 mmol/L (ref 3.5–5.1)
SODIUM: 142 mmol/L (ref 135–145)
TCO2: 34 mmol/L — ABNORMAL HIGH (ref 22–32)

## 2017-04-23 NOTE — Discharge Instructions (Signed)
The wound on your head required a staple to close it.  Keep this area clean with soap and water once or twice a day.  Have the staple removed in 1 week.  Blood pressure is quite low.  It is important to get help when you stand up so that you do not fall.  Make sure that you are drinking and eating regularly.  It appears to be important to stop taking narcotics, as this is likely lowering your blood pressure.  Please see your doctor as soon as possible to discuss weaning off narcotic treatments.

## 2017-04-23 NOTE — ED Notes (Signed)
Patient transported to CT 

## 2017-04-23 NOTE — ED Triage Notes (Signed)
Patient arrived to dpt via ambulance from nursing home.  Patient room mate stated patient ambulating to bathroom,  Observed patient fallilng and hitting head on door frame.  Bleeding controlled to back of head.  One inch laceration per ems. Vital signs stable.

## 2017-04-23 NOTE — ED Provider Notes (Signed)
[name] Provider Note   CSN: [id number] Arrival date & time: 04/23/17  1440     History   Chief Complaint Chief Complaint  Patient presents with  . Laceration  . Fall    HPI [name] is a 72 y.o. female.  She presents for evaluation of fall.  She is unable to give full history.  Fall was unwitnessed and she was found on floor.  She presents by EMS.  The patient is alert and interactive, and thinks she fell backwards.  She is unsure if she was using a cane or walker when she fell.  Level 5 caveat-confusion  HPI  Past Medical History:  Diagnosis Date  . Anemia   . Anemia   . Anxiety   . Anxiety and depression   . Bacteremia   . Cellulitis   . Chronic back pain   . Chronic diarrhea   . Chronic right hip pain   . Complication of anesthesia   . Depression   . DVT (deep venous thrombosis) (HCC)   . Dysphagia   . GERD (gastroesophageal reflux disease)   . Gout   . Gout   . Hyperlipidemia   . Hypopotassemia   . Hypothyroidism   . IBS (irritable bowel syndrome)   . Major depressive disorder   . Muscle weakness (generalized)   . Osteoporosis   . Pneumonia   . PONV (postoperative nausea and vomiting)   . Pressure ulcer   . Pressure ulcer of buttock   . Psychosis (HCC)   . Thyroid disease    hypothyroidism    Patient Active Problem List   Diagnosis Date Noted  . Dysphagia 03/06/2017  . Food impaction of esophagus 08/06/2016  . Healthcare-associated pneumonia 05/16/2016  . Acute respiratory failure with hypoxia (HCC) 05/16/2016  . Acute encephalopathy 05/16/2016  . Hypothyroidism 05/16/2016  . Influenza 05/16/2016  . Memory deficits 05/16/2016  . 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 Mechanical complication of internal orthopedic implant (HCC) 02/28/2012  . Cellulitis of right hip 01/13/2012  . Psychosis (HCC) 01/13/2012  . Depression 01/13/2012  . Gout 01/13/2012  . Chronic pain 01/13/2012  . Decubitus ulcer of left buttock,  stage 4 (HCC) 01/13/2012  . IBS (irritable bowel syndrome) 01/13/2012  . Hypokalemia 01/13/2012  . Anemia, chronic disease 01/13/2012  . Herniated disc 12/05/2011  . Seroma complicating a procedure 06/05/2011  . 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 Wound infection after surgery 05/22/2011  . Fall 04/30/2011  . Hip fracture, right (HCC) 04/30/2011  . CLOSED FRACTURE OF HEAD OF RADIUS 01/09/2010  . SUBLUXATION-RADIAL HEAD 01/09/2010    Past Surgical History:  Procedure Laterality Date  . ESOPHAGOGASTRODUODENOSCOPY N/A 07/09/2016   Procedure: ESOPHAGOGASTRODUODENOSCOPY (EGD);  Surgeon: Corbin Ade, MD;  Location: [facility];  Service: Endoscopy;  Laterality: N/A;  . ESOPHAGOGASTRODUODENOSCOPY (EGD) WITH PROPOFOL N/A 04/18/2017   Procedure: ESOPHAGOGASTRODUODENOSCOPY (EGD) WITH PROPOFOL;  Surgeon: Corbin Ade, MD;  Location: [facility];  Service: Endoscopy;  Laterality: N/A;  10:30am  . HIP ARTHROPLASTY  05/01/2011   Procedure: right ARTHROPLASTY BIPOLAR HIP;  Surgeon: Fuller Canada, MD;  Location: [facility];  Service: Orthopedics;  Laterality: Right;  . INCISION AND DRAINAGE HIP  02/08/2012   Procedure: IRRIGATION AND DEBRIDEMENT HIP;  Surgeon: Vickki Hearing, MD;  Location: [facility];  Service: Orthopedics;  Laterality: Right;  . INCISION AND DRAINAGE OF WOUND  05/24/2011   Procedure: IRRIGATION AND DEBRIDEMENT  WOUND;  Surgeon: Fuller Canada, MD;  Location: [facility];  Service: Orthopedics;  Laterality: Right;  . MALONEY DILATION N/A 04/18/2017   Procedure: Elease Hashimoto DILATION;  Surgeon: Corbin Ade, MD;  Location: [facility];  Service: Endoscopy;  Laterality: N/A;    OB History    No data available       Home Medications    Prior to Admission medications   Medication Sig Start Date End Date Taking? Authorizing Provider  aspirin EC 81 MG tablet Take 81 mg by mouth daily.   Yes [provider]  atorvastatin  (LIPITOR) 10 MG tablet Take 10 mg by mouth at bedtime.    Yes [provider]  buPROPion (WELLBUTRIN XL) 300 MG 24 hr tablet Take 300 mg by mouth daily.   Yes [provider]  Cholecalciferol (VITAMIN D-3) 5000 units TABS Take 5,000 Units by mouth every 30 (thirty) days.    Yes [provider]  levothyroxine (SYNTHROID, LEVOTHROID) 50 MCG tablet Take 50 mcg by mouth daily before breakfast.    Yes [provider]  LORazepam (ATIVAN) 0.5 MG tablet Take 0.5-1 mg by mouth See admin instructions. Take 1 mg by mouth twice daily. Take 0.5 mg by mouth at bedtime   Yes [provider]  mirtazapine (REMERON) 45 MG tablet Take 45 mg by mouth at bedtime.   Yes [provider]  OLANZapine (ZYPREXA) 15 MG tablet Take 15 mg by mouth at bedtime.   Yes [provider]  omeprazole (PRILOSEC) 20 MG capsule Take 20 mg by mouth daily.   Yes [provider]  oxybutynin (DITROPAN-XL) 5 MG 24 hr tablet Take 5 mg by mouth at bedtime.   Yes [provider]  Probiotic CAPS Take 1 capsule by mouth daily.   Yes [provider]  acetaminophen (TYLENOL) 325 MG tablet Take 650 mg by mouth every 6 (six) hours as needed for fever.     [provider]  alum & mag hydroxide-simeth (MAALOX/MYLANTA) 200-200-20 MG/5ML suspension Take 30 mLs by mouth every 6 (six) hours as needed for indigestion.    [provider]  calcium carbonate (OS-CAL) 600 MG TABS tablet Take 600 mg by mouth 2 (two) times daily with a meal.    [provider]  chlorhexidine (PERIDEX) 0.12 % solution Use as directed 15 mLs in the mouth or throat every 12 (twelve) hours as needed (gum/mouth pain).     [provider]  denosumab (PROLIA) 60 MG/ML SOLN injection Inject 60 mg into the skin every 6 (six) months. Administer in upper arm, thigh, or abdomen    [provider]  Dextromethorphan-Guaifenesin (MUCINEX DM) 30-600 MG TB12 Take 2  tablets by mouth every 12 (twelve) hours as needed (chest congestion).     [provider]  dicyclomine (BENTYL) 10 MG capsule Take 1 capsule (10 mg total) by mouth 4 (four) times daily -  before meals and at bedtime. 05/04/11   Vickki Hearing, MD  ferrous sulfate 325 (65 FE) MG tablet Take 325 mg by mouth 2 (two) times daily with a meal.     [provider]  gabapentin (NEURONTIN) 400 MG capsule Take 400 mg by mouth every 8 (eight) hours. Hold for sedation or sleep    [provider]  loperamide (IMODIUM) 2 MG capsule Take 2 mg by mouth every 6 (six) hours as needed for diarrhea or loose stools.     [provider]  morphine (MS CONTIN) 30  MG 12 hr tablet Take 30 mg by mouth every 12 (twelve) hours.    [provider]  Oxycodone HCl 10 MG TABS Take 1 tablet (10 mg total) by mouth every 6 (six) hours as needed (moderate to severe pain). 05/21/16   Avon GullyFanta, Tesfaye, MD  polyvinyl alcohol (LIQUIFILM TEARS) 1.4 % ophthalmic solution Place 1 drop into both eyes daily as needed for dry eyes.     [provider]  senna-docusate (SENNA PLUS) 8.6-50 MG per tablet Take 2 tablets by mouth 2 (two) times daily.    [provider]  vitamin C (ASCORBIC ACID) 500 MG tablet Take 500 mg by mouth 2 (two) times daily.     [provider]    Family History No family history on file.  Social History Social History   Tobacco Use  . Smoking status: Never Smoker  . Smokeless tobacco: Never Used  Substance Use Topics  . Alcohol use: No  . Drug use: No     Allergies   Diphenhydramine and Tape   Review of Systems Review of Systems  Unable to perform ROS: Mental status change     Physical Exam Updated Vital Signs BP (!) 110/58   Pulse 72   Temp 98.7 F (37.1 C) (Oral)   Resp 15   Ht 5\' 7"  (1.702 m)   Wt 49.4 kg (109 lb)   SpO2 95%   BMI 17.07 kg/m   Physical Exam  Constitutional: She appears well-developed.  Elderly,  frail  HENT:  Head: Normocephalic.  Contusion and laceration left occiput.  No associated crepitation or deformity.  Eyes: Conjunctivae and EOM are normal. Pupils are equal, round, and reactive to light.  Neck: Normal range of motion and phonation normal. Neck supple.  Cardiovascular: Normal rate and regular rhythm.  Hypotensive.  Pulmonary/Chest: Effort normal and breath sounds normal. No respiratory distress. She exhibits no tenderness.  Abdominal: Soft. She exhibits no distension. There is no tenderness. There is no guarding.  Musculoskeletal:  The poor mobility with mild stiffness of the large joints.  No gross deformity.  She is able to independently hold arms and legs off the stretcher.  Neurological: She is alert. She exhibits normal muscle tone.  She is conversant albeit slowly, and cooperative.  No dysarthria or aphasia.  Skin: Skin is warm and dry.  Psychiatric: She has a normal mood and affect. Her behavior is normal.  Nursing note and vitals reviewed.    ED Treatments / Results  Labs (all labs ordered are listed, but only abnormal results are displayed) Labs Reviewed  I-STAT CHEM 8, ED - Abnormal; Notable for the following components:      Result Value   Chloride 100 (*)    TCO2 34 (*)    Hemoglobin 11.2 (*)    HCT 33.0 (*)    All other components within normal limits  URINALYSIS, ROUTINE W REFLEX MICROSCOPIC    EKG  EKG Interpretation None       Radiology Ct Head Wo Contrast  Result Date: 04/23/2017 CLINICAL DATA:  Head injury after fall. EXAM: CT HEAD WITHOUT CONTRAST CT CERVICAL SPINE WITHOUT CONTRAST TECHNIQUE: Multidetector CT imaging of the head and cervical spine was performed following the standard protocol without intravenous contrast. Multiplanar CT image reconstructions of the cervical spine were also generated. COMPARISON:  CT scan of April 11, 2017. FINDINGS: CT HEAD FINDINGS Brain: Mild chronic ischemic white matter disease is noted. No mass  effect or midline shift is noted. Ventricular  size is within normal limits. There is no evidence of mass lesion, hemorrhage or acute infarction. Vascular: No hyperdense vessel or unexpected calcification. Skull: Normal. Negative for fracture or focal lesion. Sinuses/Orbits: No acute finding. Other: None. CT CERVICAL SPINE FINDINGS Alignment: Normal. Skull base and vertebrae: No acute fracture. No primary bone lesion or focal pathologic process. Soft tissues and spinal canal: No prevertebral fluid or swelling. No visible canal hematoma. Disc levels:  Normal. Upper chest: Negative. Other: None. IMPRESSION: Mild chronic ischemic white matter disease. No acute intracranial abnormality seen. No significant abnormality seen in the cervical spine. Electronically Signed   By: Lupita Raider, M.D.   On: 04/23/2017 16:45   Ct Cervical Spine Wo Contrast  Result Date: 04/23/2017 CLINICAL DATA:  Head injury after fall. EXAM: CT HEAD WITHOUT CONTRAST CT CERVICAL SPINE WITHOUT CONTRAST TECHNIQUE: Multidetector CT imaging of the head and cervical spine was performed following the standard protocol without intravenous contrast. Multiplanar CT image reconstructions of the cervical spine were also generated. COMPARISON:  CT scan of April 11, 2017. FINDINGS: CT HEAD FINDINGS Brain: Mild chronic ischemic white matter disease is noted. No mass effect or midline shift is noted. Ventricular size is within normal limits. There is no evidence of mass lesion, hemorrhage or acute infarction. Vascular: No hyperdense vessel or unexpected calcification. Skull: Normal. Negative for fracture or focal lesion. Sinuses/Orbits: No acute finding. Other: None. CT CERVICAL SPINE FINDINGS Alignment: Normal. Skull base and vertebrae: No acute fracture. No primary bone lesion or focal pathologic process. Soft tissues and spinal canal: No prevertebral fluid or swelling. No visible canal hematoma. Disc levels:  Normal. Upper chest: Negative. Other:  None. IMPRESSION: Mild chronic ischemic white matter disease. No acute intracranial abnormality seen. No significant abnormality seen in the cervical spine. Electronically Signed   By: Lupita Raider, M.D.   On: 04/23/2017 16:45    Procedures .Marland KitchenLaceration Repair Date/Time: 04/23/2017 7:27 PM Performed by: Mancel Bale, MD Authorized by: Mancel Bale, MD   Consent:    Consent obtained:  Emergent situation   Risks discussed:  Pain   Alternatives discussed:  No treatment Anesthesia (see MAR for exact dosages):    Anesthesia method:  None Laceration details:    Location:  Scalp   Length (cm):  3   Depth (mm):  10 Repair type:    Repair type:  Simple Pre-procedure details:    Preparation:  Patient was prepped and draped in usual sterile fashion Exploration:    Hemostasis achieved with:  Direct pressure   Wound exploration: wound explored through full range of motion     Wound extent: fascia violated     Wound extent: no foreign bodies/material noted     Contaminated: no   Treatment:    Area cleansed with:  Shur-Clens   Amount of cleaning:  Standard   Visualized foreign bodies/material removed: no   Skin repair:    Repair method:  Staples   Number of staples:  1 Approximation:    Approximation:  Loose   Vermilion border: well-aligned   Post-procedure details:    Dressing:  Open (no dressing)   Patient tolerance of procedure:  Tolerated well, no immediate complications    (including critical care time)  Medications Ordered in ED Medications - No data to display   Initial Impression / Assessment and Plan / ED Course  I have reviewed the triage vital signs and the nursing notes.  Pertinent labs & imaging results that were available during my care  of the patient were reviewed by me and considered in my medical decision making (see chart for details).      Patient Vitals for the past 24 hrs:  BP Temp Temp src Pulse Resp SpO2 Height Weight  04/23/17 1900 (!) 110/58  - - 72 - 95 % - -  04/23/17 1830 (!) 107/58 - - 69 - (!) 84 % - -  04/23/17 1800 (!) 91/55 - - 65 - 98 % - -  04/23/17 1730 (!) 111/53 - - 68 - 99 % - -  04/23/17 1700 (!) 86/47 - - 63 - 96 % - -  04/23/17 1630 (!) 84/48 - - 66 - 94 % - -  04/23/17 1625 (!) 83/50 - - 70 15 97 % - -  04/23/17 1600 (!) 83/50 - - 62 - 98 % - -  04/23/17 1538 - - - - - - 5\' 7"  (1.702 m) 49.4 kg (109 lb)  04/23/17 1530 (!) 83/49 - - 60 - 98 % - -  04/23/17 1446 (!) 87/42 98.7 F (37.1 C) Oral 69 18 97 % - -    7:29 PM Reevaluation with update and discussion. After initial assessment and treatment, an updated evaluation reveals she remains alert and comfortable, no further complaints.  Case has been discussed with her legal guardian. Mancel Bale      Final Clinical Impressions(s) / ED Diagnoses   Final diagnoses:  Injury of head, initial encounter  Laceration of scalp, initial encounter  Chronic pain syndrome  Hypotension, unspecified hypotension type   Fall, recurrent, recently here with laceration repair.  Recent endoscopy for follow-up of food dysphasia and esophageal blockage.  Found to have large hiatal hernia.  Patient regarding analgesia with low blood pressure.  Blood pressure has been low for quite some time.  Doubt acute hypotensive urgency.  She is on very high-dose chronic pain medication, she is very frail.  Nursing Notes Reviewed/ Care Coordinated Applicable Imaging Reviewed Interpretation of Laboratory Data incorporated into ED treatment  The patient appears reasonably screened and/or stabilized for discharge and I doubt any other medical condition or other [facility] requiring further screening, evaluation, or treatment in the ED at this time prior to discharge.  Plan: Home Medications-continue usual medications; Home Treatments-wound care at home; return here if the recommended treatment, does not improve the symptoms; Recommended follow up-removal stable 1 week.  Follow-up as  needed     ED Discharge Orders    None       Mancel Bale, MD 04/23/17 1930

## 2017-04-23 NOTE — ED Notes (Signed)
Small laceration noted to the back of the head. NAD or bleeding at this time. Wound and surrounding hair cleaned with SurCleans cleaner.

## 2017-04-29 NOTE — Progress Notes (Signed)
Copy of weights from [facility]. She weighed 123 pounds in 01/2016. Weighs anywhere from 110-118 pounds most of 2018 but lowest of 109 pounds in 02/2017.   egd completed 04/18/16.Hgb stable at 11 during pre-op labs. Esophagus dilated. F/u ov as planned.

## 2017-05-14 ENCOUNTER — Encounter (HOSPITAL_COMMUNITY): Payer: Self-pay | Admitting: Emergency Medicine

## 2017-05-14 ENCOUNTER — Emergency Department (HOSPITAL_COMMUNITY): Payer: Medicare Other

## 2017-05-14 ENCOUNTER — Other Ambulatory Visit: Payer: Self-pay

## 2017-05-14 ENCOUNTER — Emergency Department (HOSPITAL_COMMUNITY)
Admission: EM | Admit: 2017-05-14 | Discharge: 2017-05-14 | Disposition: A | Discharge status: Home / Self Care | Payer: Medicare Other | Attending: Emergency Medicine | Admitting: Emergency Medicine

## 2017-05-14 DIAGNOSIS — Y939 Activity, unspecified: Secondary | ICD-10-CM | POA: Insufficient documentation

## 2017-05-14 DIAGNOSIS — W050XXA Fall from non-moving wheelchair, initial encounter: Secondary | ICD-10-CM | POA: Diagnosis not present

## 2017-05-14 DIAGNOSIS — E039 Hypothyroidism, unspecified: Secondary | ICD-10-CM | POA: Diagnosis not present

## 2017-05-14 DIAGNOSIS — Z96641 Presence of right artificial hip joint: Secondary | ICD-10-CM | POA: Diagnosis not present

## 2017-05-14 DIAGNOSIS — W19XXXA Unspecified fall, initial encounter: Secondary | ICD-10-CM

## 2017-05-14 DIAGNOSIS — Z79899 Other long term (current) drug therapy: Secondary | ICD-10-CM | POA: Diagnosis not present

## 2017-05-14 DIAGNOSIS — Y999 Unspecified external cause status: Secondary | ICD-10-CM | POA: Diagnosis not present

## 2017-05-14 DIAGNOSIS — M542 Cervicalgia: Secondary | ICD-10-CM

## 2017-05-14 DIAGNOSIS — Y929 Unspecified place or not applicable: Secondary | ICD-10-CM | POA: Diagnosis not present

## 2017-05-14 DIAGNOSIS — S0990XA Unspecified injury of head, initial encounter: Secondary | ICD-10-CM

## 2017-05-14 NOTE — Discharge Instructions (Signed)
CT scan of head and cervical spine showed no acute injury.  You have a small contusion on the back of your scalp.  Recommend been dressing for 2 days.  Tylenol for headache.  There may be a very small amount of bleeding.

## 2017-05-14 NOTE — ED Provider Notes (Signed)
[facility] EMERGENCY DEPARTMENT Provider Note   CSN: [id number] Arrival date & time: 05/14/17  1445     History   Chief Complaint Chief Complaint  Patient presents with  . Fall    HPI [name] is a 72 y.o. female.  Level 5 caveat for dementia.  Patient accidentally fell out of her wheelchair striking the back of her head.  She complains of head and neck pain.  No other obvious injuries.  Severity of symptoms is mild to moderate.      Past Medical History:  Diagnosis Date  . Anemia   . Anemia   . Anxiety   . Anxiety and depression   . Bacteremia   . Cellulitis   . Chronic back pain   . Chronic diarrhea   . Chronic right hip pain   . Complication of anesthesia   . Depression   . DVT (deep venous thrombosis) (HCC)   . Dysphagia   . GERD (gastroesophageal reflux disease)   . Gout   . Gout   . Hyperlipidemia   . Hypopotassemia   . Hypothyroidism   . IBS (irritable bowel syndrome)   . Major depressive disorder   . Muscle weakness (generalized)   . Osteoporosis   . Pneumonia   . PONV (postoperative nausea and vomiting)   . Pressure ulcer   . Pressure ulcer of buttock   . Psychosis (HCC)   . Thyroid disease    hypothyroidism    Patient Active Problem List   Diagnosis Date Noted  . Dysphagia 03/06/2017  . Food impaction of esophagus 08/06/2016  . Healthcare-associated pneumonia 05/16/2016  . Acute respiratory failure with hypoxia (HCC) 05/16/2016  . Acute encephalopathy 05/16/2016  . Hypothyroidism 05/16/2016  . Influenza 05/16/2016  . Memory deficits 05/16/2016  . 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 Mechanical complication of internal orthopedic implant (HCC) 02/28/2012  . Cellulitis of right hip 01/13/2012  . Psychosis (HCC) 01/13/2012  . Depression 01/13/2012  . Gout 01/13/2012  . Chronic pain 01/13/2012  . Decubitus ulcer of left buttock, stage 4 (HCC) 01/13/2012  . IBS (irritable bowel syndrome) 01/13/2012  . Hypokalemia 01/13/2012  .  Anemia, chronic disease 01/13/2012  . Herniated disc 12/05/2011  . Seroma complicating a procedure 06/05/2011  . 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 Wound infection after surgery 05/22/2011  . Fall 04/30/2011  . Hip fracture, right (HCC) 04/30/2011  . CLOSED FRACTURE OF HEAD OF RADIUS 01/09/2010  . SUBLUXATION-RADIAL HEAD 01/09/2010    Past Surgical History:  Procedure Laterality Date  . ESOPHAGOGASTRODUODENOSCOPY N/A 07/09/2016   Procedure: ESOPHAGOGASTRODUODENOSCOPY (EGD);  Surgeon: Corbin Ade, MD;  Location: [facility];  Service: Endoscopy;  Laterality: N/A;  . ESOPHAGOGASTRODUODENOSCOPY (EGD) WITH PROPOFOL N/A 04/18/2017   Procedure: ESOPHAGOGASTRODUODENOSCOPY (EGD) WITH PROPOFOL;  Surgeon: Corbin Ade, MD;  Location: [facility];  Service: Endoscopy;  Laterality: N/A;  10:30am  . HIP ARTHROPLASTY  05/01/2011   Procedure: right ARTHROPLASTY BIPOLAR HIP;  Surgeon: Fuller Canada, MD;  Location: [facility] ORS;  Service: Orthopedics;  Laterality: Right;  . INCISION AND DRAINAGE HIP  02/08/2012   Procedure: IRRIGATION AND DEBRIDEMENT HIP;  Surgeon: Vickki Hearing, MD;  Location: [facility] OR[facility];  Service: Orthopedics;  Laterality: Right;  . INCISION AND DRAINAGE OF WOUND  05/24/2011   Procedure: IRRIGATION AND DEBRIDEMENT WOUND;  Surgeon: Fuller Canada, MD;  Location: [facility];  Service: Orthopedics;  Laterality: Right;  . MALONEY DILATION N/A  04/18/2017   Procedure: Elease Hashimoto DILATION;  Surgeon: Corbin Ade, MD;  Location: [facility];  Service: Endoscopy;  Laterality: N/A;    OB History    No data available       Home Medications    Prior to Admission medications   Medication Sig Start Date End Date Taking? Authorizing Provider  acetaminophen (TYLENOL) 325 MG tablet Take 650 mg by mouth every 6 (six) hours as needed for fever.     [provider]  alum & mag hydroxide-simeth (MAALOX/MYLANTA) 200-200-20 MG/5ML  suspension Take 30 mLs by mouth every 6 (six) hours as needed for indigestion.    [provider]  aspirin EC 81 MG tablet Take 81 mg by mouth daily.    [provider]  atorvastatin (LIPITOR) 10 MG tablet Take 10 mg by mouth at bedtime.     [provider]  buPROPion (WELLBUTRIN XL) 300 MG 24 hr tablet Take 300 mg by mouth daily.    [provider]  calcium carbonate (OS-CAL) 600 MG TABS tablet Take 600 mg by mouth 2 (two) times daily with a meal.    [provider]  chlorhexidine (PERIDEX) 0.12 % solution Use as directed 15 mLs in the mouth or throat every 12 (twelve) hours as needed (gum/mouth pain).     [provider]  Cholecalciferol (VITAMIN D-3) 5000 units TABS Take 5,000 Units by mouth every 30 (thirty) days.     [provider]  denosumab (PROLIA) 60 MG/ML SOLN injection Inject 60 mg into the skin every 6 (six) months. Administer in upper arm, thigh, or abdomen    [provider]  Dextromethorphan-Guaifenesin (MUCINEX DM) 30-600 MG TB12 Take 2 tablets by mouth every 12 (twelve) hours as needed (chest congestion).     [provider]  dicyclomine (BENTYL) 10 MG capsule Take 1 capsule (10 mg total) by mouth 4 (four) times daily -  before meals and at bedtime. 05/04/11   Vickki Hearing, MD  ferrous sulfate 325 (65 FE) MG tablet Take 325 mg by mouth 2 (two) times daily with a meal.     [provider]  gabapentin (NEURONTIN) 400 MG capsule Take 400 mg by mouth every 8 (eight) hours. Hold for sedation or sleep    [provider]  levothyroxine (SYNTHROID, LEVOTHROID) 50 MCG tablet Take 50 mcg by mouth daily before breakfast.     [provider]  loperamide (IMODIUM) 2 MG capsule Take 2 mg by mouth every 6 (six) hours as needed for diarrhea or loose stools.     [provider]  LORazepam (ATIVAN) 0.5 MG tablet Take 0.5-1 mg by mouth See admin instructions. Take 1 mg by mouth twice  daily. Take 0.5 mg by mouth at bedtime    [provider]  mirtazapine (REMERON) 45 MG tablet Take 45 mg by mouth at bedtime.    [provider]  morphine (MS CONTIN) 30 MG 12 hr tablet Take 30 mg by mouth every 12 (twelve) hours.    [provider]  OLANZapine (ZYPREXA) 15 MG tablet Take 15 mg by mouth at bedtime.    [provider]  omeprazole (PRILOSEC) 20 MG capsule Take 20 mg by mouth daily.    [provider]  oxybutynin (DITROPAN-XL) 5 MG 24 hr tablet Take 5 mg by mouth at bedtime.    [provider]  Oxycodone HCl 10 MG TABS Take 1 tablet (10 mg total) by mouth every 6 (six)  hours as needed (moderate to severe pain). 05/21/16   Avon GullyFanta, Tesfaye, MD  polyvinyl alcohol (LIQUIFILM TEARS) 1.4 % ophthalmic solution Place 1 drop into both eyes daily as needed for dry eyes.     [provider]  Probiotic CAPS Take 1 capsule by mouth daily.    [provider]  senna-docusate (SENNA PLUS) 8.6-50 MG per tablet Take 2 tablets by mouth 2 (two) times daily.    [provider]  vitamin C (ASCORBIC ACID) 500 MG tablet Take 500 mg by mouth 2 (two) times daily.     [provider]    Family History No family history on file.  Social History Social History   Tobacco Use  . Smoking status: Never Smoker  . Smokeless tobacco: Never Used  Substance Use Topics  . Alcohol use: No  . Drug use: No     Allergies   Diphenhydramine and Tape   Review of Systems Review of Systems  Unable to perform ROS: Dementia     Physical Exam Updated Vital Signs BP (!) 95/50 (BP Location: Right Arm)   Pulse 67   Temp 98.2 F (36.8 C) (Oral)   Resp 18   Ht 5\' 7"  (1.702 m)   Wt 49.4 kg (109 lb)   SpO2 98%   BMI 17.07 kg/m   Physical Exam  Constitutional: She appears well-developed and well-nourished.  HENT:  Head: Normocephalic.  2.0 cm contusion/abrasion on posterior superior scalp  Eyes: Conjunctivae are  normal.  Neck: Neck supple.  Cardiovascular: Normal rate and regular rhythm.  Pulmonary/Chest: Effort normal and breath sounds normal.  Abdominal: Soft. Bowel sounds are normal.  Musculoskeletal: Normal range of motion.  Neurological: She is alert.  Skin: Skin is warm and dry.  Psychiatric:  Flat affect  Nursing note and vitals reviewed.    ED Treatments / Results  Labs (all labs ordered are listed, but only abnormal results are displayed) Labs Reviewed - No data to display  EKG  EKG Interpretation None       Radiology Ct Head Wo Contrast  Result Date: 05/14/2017 CLINICAL DATA:  Pain following fall EXAM: CT HEAD WITHOUT CONTRAST CT CERVICAL SPINE WITHOUT CONTRAST TECHNIQUE: Multidetector CT imaging of the head and cervical spine was performed following the standard protocol without intravenous contrast. Multiplanar CT image reconstructions of the cervical spine were also generated. COMPARISON:  CT head and CT cervical spine April 23, 2017; CT head and CT cervical spine April 11, 2017 FINDINGS: CT HEAD FINDINGS Brain: Mild to moderate diffuse atrophy is stable. There is no intracranial mass, hemorrhage, extra-axial fluid collection, or midline shift. There is mild small vessel disease in the centra semiovale bilaterally, stable. There is no new gray-white compartment lesion. No acute infarct evident. Vascular: No hyperdense vessel. There is slight calcification in each carotid siphon region. Skull: Bony calvarium appears intact. Sinuses/Orbits: Patient has had antrostomies bilaterally. Visualized paranasal sinuses are clear except for patchy opacity in the right sphenoid sinus, stable. Visualized orbits appear symmetric bilaterally. Other: Mastoid air cells are clear. CT CERVICAL SPINE FINDINGS Alignment: There is no appreciable spondylolisthesis. Skull base and vertebrae: Skull base and craniocervical junction regions appear normal. No evident fracture. There are no blastic or lytic  bone lesions. Soft tissues and spinal canal: Prevertebral soft tissues and predental space regions are normal. There are no paraspinous lesions. There is no evident cord or canal hematoma. Disc levels: There is no appreciable disc space narrowing. There is facet hypertrophy at several levels  bilaterally. There is moderate exit foraminal narrowing on the right at C5-6 due to bony hypertrophy. No disc extrusion or stenosis evident. Upper chest: There is mild scarring in the lung apices. Visualized upper lung regions are clear otherwise normal. Other: There is calcification in each carotid artery. IMPRESSION: CT head: Stable atrophy with mild small vessel disease. No acute infarct. No mass or hemorrhage. Mild vascular calcification noted. Postoperative changes noted in the maxillary antra. Stable right sphenoid sinus disease. CT cervical spine: No fracture or spondylolisthesis. Areas of osteoarthritic change. Calcification in both carotid arteries noted. Electronically Signed   By: Bretta Bang III M.D.   On: 05/14/2017 17:05   Ct Cervical Spine Wo Contrast  Result Date: 05/14/2017 CLINICAL DATA:  Pain following fall EXAM: CT HEAD WITHOUT CONTRAST CT CERVICAL SPINE WITHOUT CONTRAST TECHNIQUE: Multidetector CT imaging of the head and cervical spine was performed following the standard protocol without intravenous contrast. Multiplanar CT image reconstructions of the cervical spine were also generated. COMPARISON:  CT head and CT cervical spine April 23, 2017; CT head and CT cervical spine April 11, 2017 FINDINGS: CT HEAD FINDINGS Brain: Mild to moderate diffuse atrophy is stable. There is no intracranial mass, hemorrhage, extra-axial fluid collection, or midline shift. There is mild small vessel disease in the centra semiovale bilaterally, stable. There is no new gray-white compartment lesion. No acute infarct evident. Vascular: No hyperdense vessel. There is slight calcification in each carotid siphon  region. Skull: Bony calvarium appears intact. Sinuses/Orbits: Patient has had antrostomies bilaterally. Visualized paranasal sinuses are clear except for patchy opacity in the right sphenoid sinus, stable. Visualized orbits appear symmetric bilaterally. Other: Mastoid air cells are clear. CT CERVICAL SPINE FINDINGS Alignment: There is no appreciable spondylolisthesis. Skull base and vertebrae: Skull base and craniocervical junction regions appear normal. No evident fracture. There are no blastic or lytic bone lesions. Soft tissues and spinal canal: Prevertebral soft tissues and predental space regions are normal. There are no paraspinous lesions. There is no evident cord or canal hematoma. Disc levels: There is no appreciable disc space narrowing. There is facet hypertrophy at several levels bilaterally. There is moderate exit foraminal narrowing on the right at C5-6 due to bony hypertrophy. No disc extrusion or stenosis evident. Upper chest: There is mild scarring in the lung apices. Visualized upper lung regions are clear otherwise normal. Other: There is calcification in each carotid artery. IMPRESSION: CT head: Stable atrophy with mild small vessel disease. No acute infarct. No mass or hemorrhage. Mild vascular calcification noted. Postoperative changes noted in the maxillary antra. Stable right sphenoid sinus disease. CT cervical spine: No fracture or spondylolisthesis. Areas of osteoarthritic change. Calcification in both carotid arteries noted. Electronically Signed   By: Bretta Bang III M.D.   On: 05/14/2017 17:05    Procedures Procedures (including critical care time)  Medications Ordered in ED Medications - No data to display   Initial Impression / Assessment and Plan / ED Course  I have reviewed the triage vital signs and the nursing notes.  Pertinent labs & imaging results that were available during my care of the patient were reviewed by me and considered in my medical decision making  (see chart for details).     Patient presents with accidental fall out of wheelchair striking her head.  CT head and CT cervical spine negative for acute findings.  There is a small abrasion on the posterior superior scalp.  This did not require laceration repair.  She is slightly hypotensive,  but nontoxic-appearing.  Final Clinical Impressions(s) / ED Diagnoses   Final diagnoses:  Fall, initial encounter  Minor head injury, initial encounter  Neck pain    ED Discharge Orders    None       Donnetta Hutching, MD 05/14/17 1810

## 2017-05-14 NOTE — ED Triage Notes (Signed)
[facility] Patient found on the floor with bleeding laceration on back of her head. Report to EMS was that patient has 1" laceration to back of head. Patient is alert in triage bleeding controlled.

## 2017-05-14 NOTE — ED Notes (Signed)
Patient was given dinner tray and transported back to [facility] by [facility].

## 2017-07-17 ENCOUNTER — Encounter: Payer: Self-pay | Admitting: Gastroenterology

## 2017-07-17 ENCOUNTER — Ambulatory Visit (INDEPENDENT_AMBULATORY_CARE_PROVIDER_SITE_OTHER): Payer: Medicare Other | Admitting: Gastroenterology

## 2017-07-17 VITALS — BP 92/59 | HR 64 | Temp 97.2°F | Ht 67.0 in | Wt 110.0 lb

## 2017-07-17 DIAGNOSIS — R197 Diarrhea, unspecified: Secondary | ICD-10-CM | POA: Diagnosis not present

## 2017-07-17 DIAGNOSIS — R1310 Dysphagia, unspecified: Secondary | ICD-10-CM

## 2017-07-17 DIAGNOSIS — D500 Iron deficiency anemia secondary to blood loss (chronic): Secondary | ICD-10-CM | POA: Insufficient documentation

## 2017-07-17 DIAGNOSIS — R1319 Other dysphagia: Secondary | ICD-10-CM

## 2017-07-17 DIAGNOSIS — D649 Anemia, unspecified: Secondary | ICD-10-CM | POA: Diagnosis not present

## 2017-07-17 DIAGNOSIS — D62 Acute posthemorrhagic anemia: Secondary | ICD-10-CM | POA: Insufficient documentation

## 2017-07-17 NOTE — Assessment & Plan Note (Signed)
Symptoms improved status post dilation. According to staff she is eating very well without any difficulties.

## 2017-07-17 NOTE — Assessment & Plan Note (Addendum)
Chronic stable normocytic anemia, ferritin and B12 normal. Anemia labs most consistent with anemia of chronic disease. We'll check I FOBT.  Overdue for surveillance colonoscopy for history of adenomatous colon polyps. Await pending stool test.

## 2017-07-17 NOTE — Assessment & Plan Note (Signed)
Suspect loose stools related to ongoing senna plus use. We'll decrease dose to once daily.

## 2017-07-17 NOTE — Progress Notes (Signed)
[facility] to [facility]

## 2017-07-17 NOTE — Patient Instructions (Signed)
1. Please collect stool specimen (ifobt) and return to our office.  2. Please decrease senna plus to 2 tablets ONCE daily. Hold for diarrhea.  3. Please fax copy of last 3 months of weights to [phone].

## 2017-07-17 NOTE — Progress Notes (Signed)
cc'ed to pcp °

## 2017-07-17 NOTE — Progress Notes (Signed)
Primary Care Physician: Charlynne Pander, MD  Primary Gastroenterologist:  Roetta Sessions, MD   Chief Complaint  Patient presents with  . Dysphagia    doing ok    HPI: [name] is a 72 y.o. female here for follow-up. She resides at [facility]. She has a history of psychosis, depression, memory deficits. She underwent an EGD back in January for dysphagia. She had a large hiatal hernia, esophagus was dilated with mild improvement of luminal narrowing.   Patient presents with aid today. According to the aid patient has been eating very well. Patient states her swallowing is much improved. She has intermittent heartburn but is on omeprazole daily. She reports a good appetite. History reliability is questionable.according to staff she's been weighing around 110-112 poundsfor the most part of 2018-2019.  Patient complains of some abdominal cramping associated with loose stool about one to 2 times per day. She states her stools are very dark. She is on iron. Patient denies blood per rectum. According to her medication list, she receives senna +2 tablets twice daily to prevent constipation, "hold for diarrhea". We contacted the nursing home. Verify that the patient has been receiving the medication regularly.  Most recent labs from March 2019 total bilirubin less than 0.2, alkaline phosphatase 56, AST 12, a 0T9, albumin 3.4, BU in 20.1, creatinine 0.58, white blood cell count 5100, hemoglobin 10.6, hematocrit 31.2, MCV 84.8, B12 488, iron 77, TIBC 213, iron saturation is 36%, ferritin 45.  Current Outpatient Medications  Medication Sig Dispense Refill  . acetaminophen (TYLENOL) 325 MG tablet Take 650 mg by mouth every 6 (six) hours as needed for fever.     Marland Kitchen alum & mag hydroxide-simeth (MAALOX/MYLANTA) 200-200-20 MG/5ML suspension Take 30 mLs by mouth every 6 (six) hours as needed for indigestion.    Marland Kitchen aspirin EC 81 MG tablet Take 81 mg by mouth daily.    Marland Kitchen atorvastatin (LIPITOR) 10 MG tablet  Take 10 mg by mouth at bedtime.     Marland Kitchen buPROPion (WELLBUTRIN XL) 300 MG 24 hr tablet Take 300 mg by mouth daily.    . calcium carbonate (OS-CAL) 600 MG TABS tablet Take 600 mg by mouth 2 (two) times daily with a meal.    . chlorhexidine (PERIDEX) 0.12 % solution Use as directed 15 mLs in the mouth or throat every 12 (twelve) hours as needed (gum/mouth pain).     . Cholecalciferol (VITAMIN D3) 50000 units TABS Take by mouth every 14 (fourteen) days.    . diclofenac sodium (VOLTAREN) 1 % GEL Apply 2 g topically 4 (four) times daily.    Marland Kitchen dicyclomine (BENTYL) 10 MG capsule Take 1 capsule (10 mg total) by mouth 4 (four) times daily -  before meals and at bedtime. 40 capsule 0  . ferrous sulfate 325 (65 FE) MG tablet Take 325 mg by mouth 2 (two) times daily with a meal.     . gabapentin (NEURONTIN) 300 MG capsule Take 300 mg by mouth 3 (three) times daily.    Marland Kitchen levothyroxine (SYNTHROID, LEVOTHROID) 50 MCG tablet Take 50 mcg by mouth daily before breakfast.     . loperamide (IMODIUM) 2 MG capsule Take 2 mg by mouth every 6 (six) hours as needed for diarrhea or loose stools.     Marland Kitchen LORazepam (ATIVAN) 0.5 MG tablet Take 0.5 mg by mouth 2 (two) times daily.    . mirtazapine (REMERON) 45 MG tablet Take 45 mg by mouth at bedtime.    Marland Kitchen  omeprazole (PRILOSEC) 20 MG capsule Take 20 mg by mouth daily.    Marland Kitchen. oxybutynin (DITROPAN-XL) 5 MG 24 hr tablet Take 5 mg by mouth at bedtime.    . OxyCODONE HCl, Abuse Deter, (OXAYDO) 5 MG TABA Take by mouth every 6 (six) hours as needed.    . polyvinyl alcohol (LIQUIFILM TEARS) 1.4 % ophthalmic solution Place 1 drop into both eyes daily as needed for dry eyes.     . Probiotic CAPS Take 1 capsule by mouth daily.    Marland Kitchen. senna-docusate (SENNA PLUS) 8.6-50 MG per tablet Take 2 tablets by mouth 2 (two) times daily.    . vitamin C (ASCORBIC ACID) 500 MG tablet Take 500 mg by mouth 2 (two) times daily.      No current facility-administered medications for this visit.     Allergies  as of 07/17/2017 - Review Complete 07/17/2017  Allergen Reaction Noted  . Diphenhydramine Other (See Comments) 04/30/2011  . Tape Dermatitis 11/20/2013    ROS: ?reliable  General: Negative for anorexia, weight loss, fever, chills, fatigue, weakness. ENT: Negative for hoarseness, difficulty swallowing , nasal congestion. CV: Negative for chest pain, angina, palpitations, dyspnea on exertion, peripheral edema.  Respiratory: Negative for dyspnea at rest, dyspnea on exertion, cough, sputum, wheezing.  GI: See history of present illness. GU:  Negative for dysuria, hematuria, urinary incontinence, urinary frequency, nocturnal urination.  Endo: Negative for unusual weight change.    Physical Examination:   BP (!) 92/59   Pulse 64   Temp (!) 97.2 F (36.2 C) (Oral)   Ht 5\' 7"  (1.702 m)   Wt 110 lb (49.9 kg)   BMI 17.23 kg/m   General: chronically ill appearing WF, in NAD. Accompanied by staff from [facility].   Eyes: No icterus. Mouth: Oropharyngeal mucosa moist and pink , no lesions erythema or exudate. Lungs: Clear to auscultation bilaterally.  Heart: Regular rate and rhythm, no murmurs rubs or gallops.  Abdomen: Bowel sounds are normal, nontender, nondistended, no hepatosplenomegaly or masses, no abdominal bruits or hernia , no rebound or guarding.  Examined in wheelchair.  Extremities: No lower extremity edema. No clubbing or deformities. Neuro: Alert and oriented to person/place   Skin: Warm and dry, no jaundice.   Psych: Alert and cooperative, normal mood and affect.  Labs:  See above Imaging Studies: No results found.

## 2017-07-17 NOTE — Progress Notes (Signed)
CC: [facility]

## 2018-01-20 ENCOUNTER — Telehealth: Payer: Self-pay | Admitting: Orthopedic Surgery

## 2018-01-20 NOTE — Telephone Encounter (Signed)
[name] from [facility] called and wanted to schedule this patient with Dr. Romeo Apple.She said this patient was having increased pain in  the hip and has had an x-ray done.  She did fax to me the information so that Dr. Romeo Apple could review.  I told [name] that Dr. Romeo Apple would have to review the information and then advise regarding appointment.  Dr. Romeo Apple did review the xray report and the information sent by [facility].  He stated that he felt [name] would need to return to her previous physician at [facility].    I called [name] and advised her of Dr. Romeo Apple decision.

## 2019-05-06 IMAGING — CT CT CERVICAL SPINE W/O CM
3 of 6 series · 14 of 33 positions shown, 16 images · non-contrast
Comparison: 11/07/2016

CLINICAL DATA: Pt apparently fell at [HOSPITAL] facility, has
swelling to the back of her head with a small laceration. No
apparent loc. Pt is awake and alert at this time, bleeding Is
controlled

EXAM:
CT HEAD WITHOUT CONTRAST
CT CERVICAL SPINE WITHOUT CONTRAST
TECHNIQUE: Multidetector CT imaging of the head and cervical spine was
performed following the standard protocol without intravenous
contrast. Multiplanar CT image reconstructions of the cervical spine
were also generated.

[Series 8: c spine soft · axial · 0.32mm/px · z∈[+1278,+1400]mm · 8 of 79 slices shown, 10 images]
[im 9/79  soft-tissue]
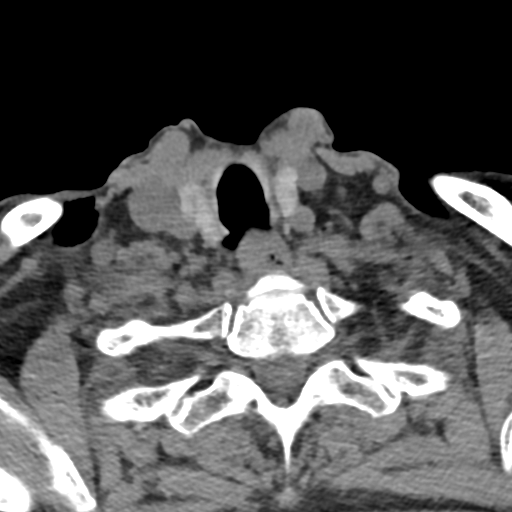
[im 9/79  bone]
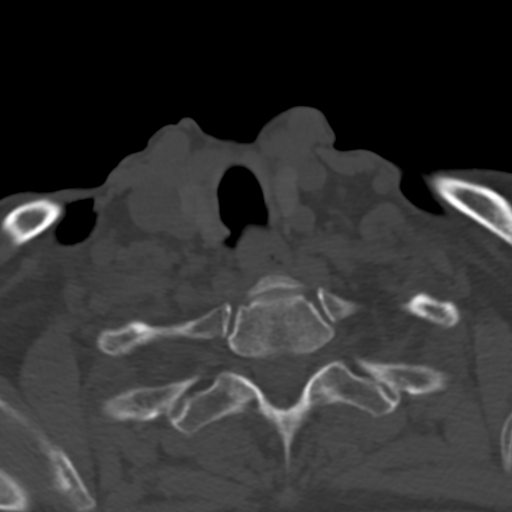
[im 18/79  bone]
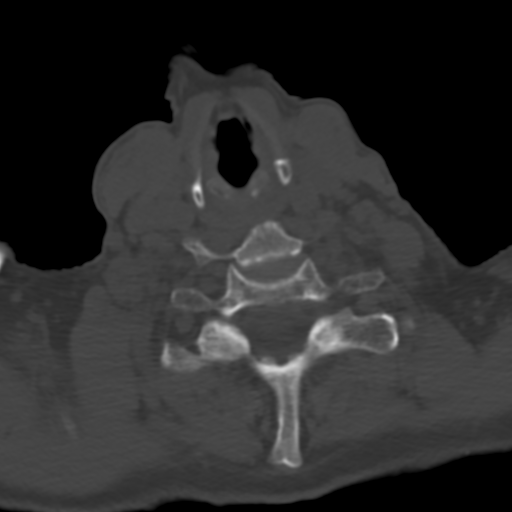
[im 27/79  bone]
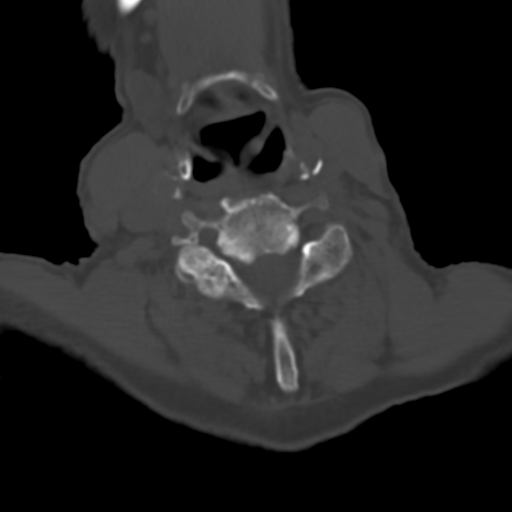
[im 35/79  bone]
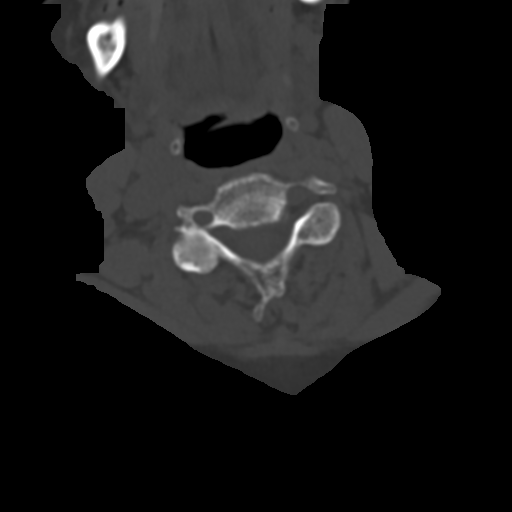
[im 44/79  soft-tissue]
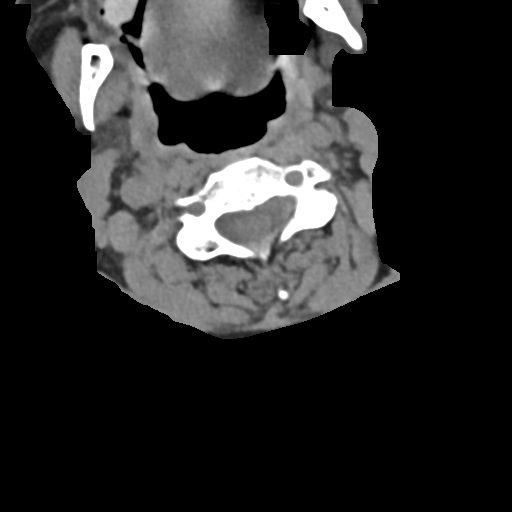
[im 44/79  bone]
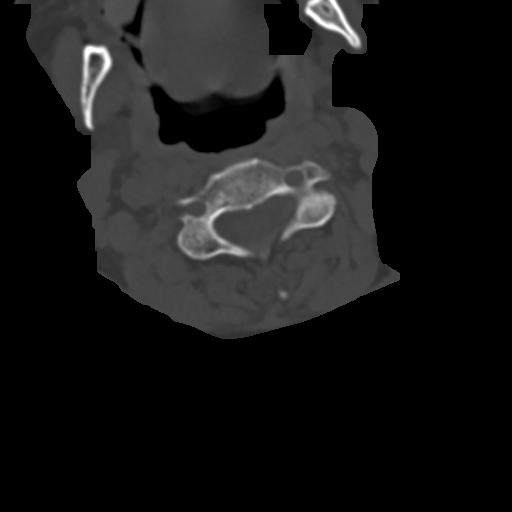
[im 53/79  bone]
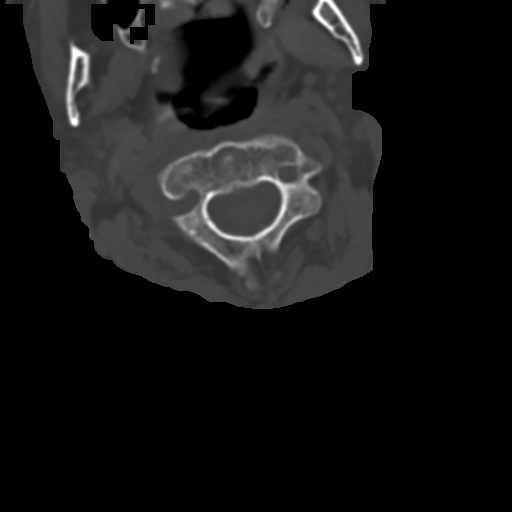
[im 61/79  bone]
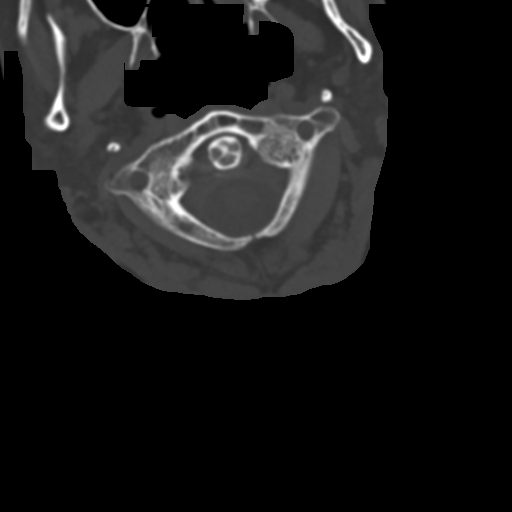
[im 70/79  bone]
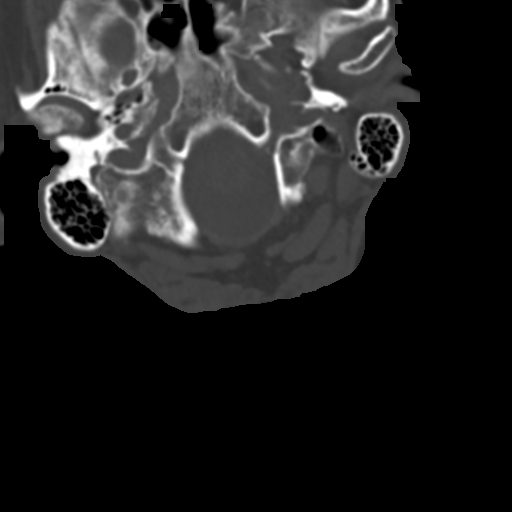

[Series 9: sagittal bone · sagittal · 0.23mm/px · 5 of 61 slices shown]
[im 11/61  bone]
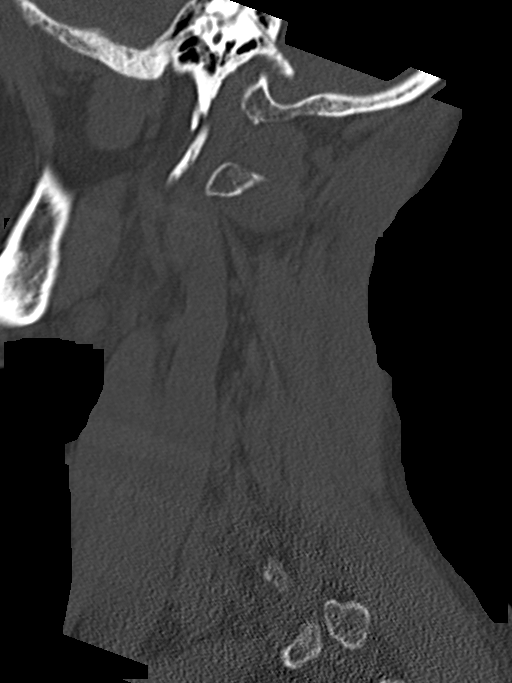
[im 21/61  bone]
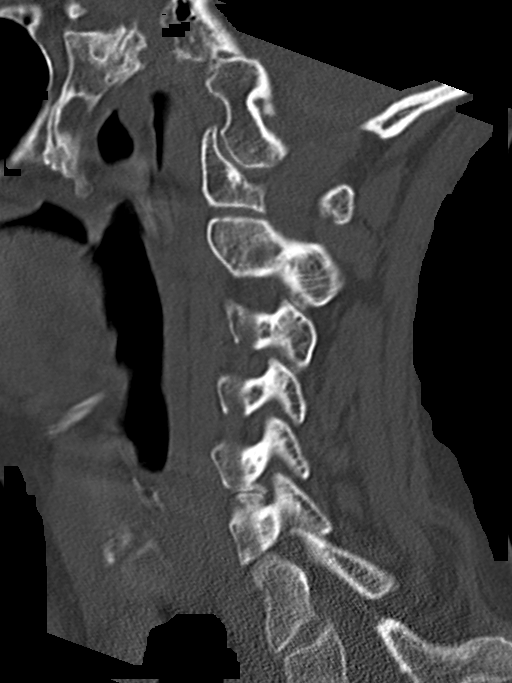
[im 31/61  bone]
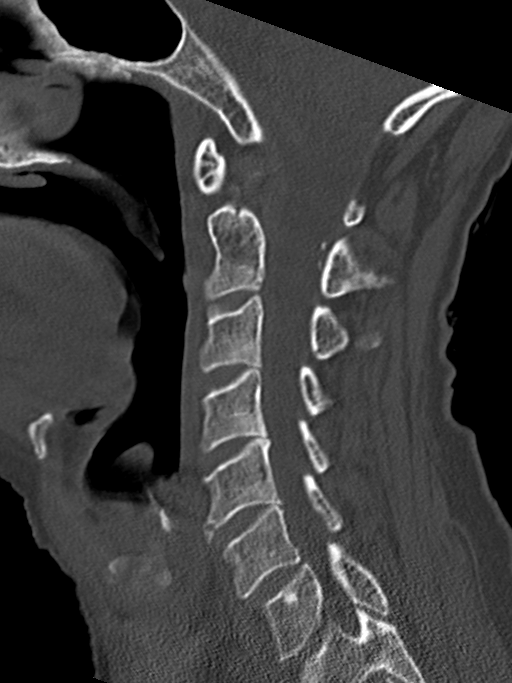
[im 41/61  bone]
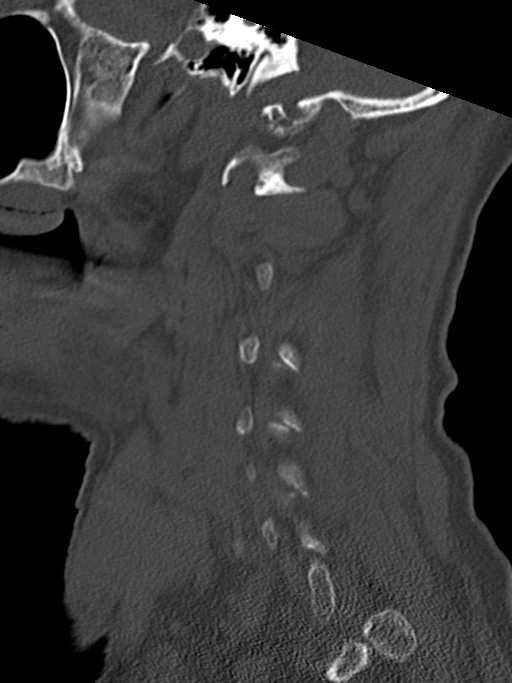
[im 51/61  bone]
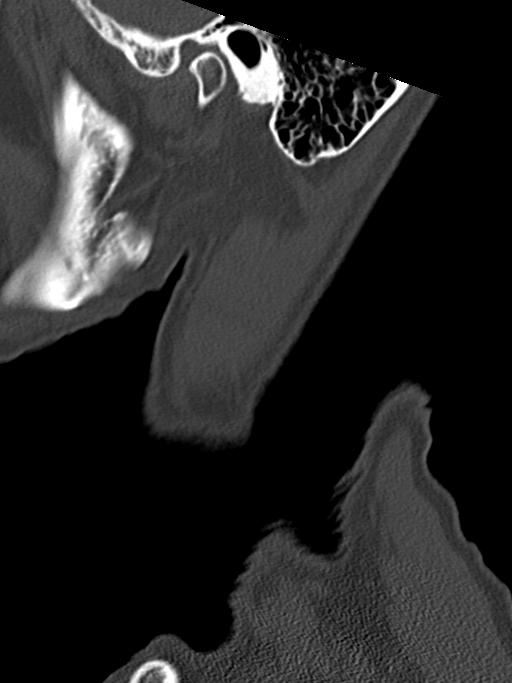

[Series 10: coronal bone · coronal · 0.23mm/px · 1 of 61 slices shown]
[im 31/61  bone]
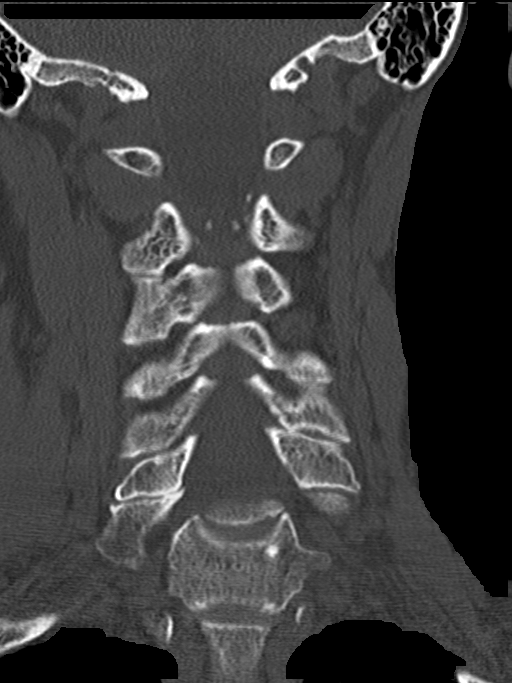

[14 of 33 positions shown; findings below may reference images not displayed]

FINDINGS: CT HEAD FINDINGS

Brain: There is central and cortical atrophy. Periventricular white
matter changes are consistent with small vessel disease. There is no
intra or extra-axial fluid collection or mass lesion. The basilar
cisterns and ventricles have a normal appearance. There is no CT
evidence for acute infarction or hemorrhage.

Vascular: No hyperdense vessel or unexpected calcification.

Skull: Normal. Negative for fracture or focal lesion.

Sinuses/Orbits: Postoperative changes in the medial walls of the
maxillary sinuses bilaterally.

Other: Large left occipital scalp hematoma not associated with
fracture.

CT CERVICAL SPINE FINDINGS

Alignment: Normal.

Skull base and vertebrae: No acute fracture. No primary bone lesion
or focal pathologic process.

Soft tissues and spinal canal: No prevertebral fluid or swelling. No
visible canal hematoma.

Disc levels:  Normal

Upper chest: Stable biapical pleuroparenchymal thickening over
multiple prior studies (1150)

Other: None
IMPRESSION: 1.  No evidence for acute intracranial abnormality.
2. Atrophy and small vessel disease.
3. Large left occipital scalp hematoma not associated with fracture.
4.  No evidence for acute cervical spine abnormality.

## 2021-01-16 ENCOUNTER — Other Ambulatory Visit (HOSPITAL_COMMUNITY): Payer: Self-pay | Admitting: Internal Medicine

## 2021-01-16 DIAGNOSIS — N63 Unspecified lump in unspecified breast: Secondary | ICD-10-CM

## 2021-02-14 ENCOUNTER — Inpatient Hospital Stay (HOSPITAL_COMMUNITY): Admission: RE | Admit: 2021-02-14 | Payer: Medicare Other | Source: Ambulatory Visit

## 2021-02-14 ENCOUNTER — Ambulatory Visit (HOSPITAL_COMMUNITY): Payer: Medicare (Managed Care)

## 2021-03-14 ENCOUNTER — Encounter (HOSPITAL_COMMUNITY): Payer: Medicare (Managed Care)

## 2021-03-14 ENCOUNTER — Ambulatory Visit (HOSPITAL_COMMUNITY): Payer: Medicare (Managed Care)

## 2021-03-16 ENCOUNTER — Encounter: Payer: Self-pay | Admitting: Psychiatry

## 2021-03-16 ENCOUNTER — Ambulatory Visit (INDEPENDENT_AMBULATORY_CARE_PROVIDER_SITE_OTHER): Payer: Medicare (Managed Care) | Admitting: Psychiatry

## 2021-03-16 VITALS — BP 141/67 | HR 59 | Ht 67.0 in | Wt 140.0 lb

## 2021-03-16 DIAGNOSIS — R519 Headache, unspecified: Secondary | ICD-10-CM | POA: Diagnosis not present

## 2021-03-16 MED ORDER — PREGABALIN 50 MG PO CAPS: ORAL_CAPSULE | ORAL | 2 refills | Status: DC

## 2021-03-16 NOTE — Patient Instructions (Signed)
Increase Lyrica to 50 mg in the morning, 50 mg in the afternoon, and 75 mg at bedtime

## 2021-03-16 NOTE — Progress Notes (Signed)
Referring:  Caprice Renshaw, MD [address] [place],  [place] [address]  PCP: Caprice Renshaw, MD  Neurology was asked to evaluate [name], a 75 year old female for a chief complaint of headaches.  Our recommendations of care will be communicated by shared medical record.    CC:  headaches  HPI:  Medical co-morbidities: depression with psychosis, hypothyroidism, ?dementia (on namenda)  The patient presents for evaluation of headaches. She is a poor historian and gives inconsistent answers. Believes headaches started one year ago. They are described as sharp pains in her entire head which occur every day. States headaches "don't last too long", but cannot specify duration any further. She will see spots with her headaches occasionally. Denies phonophobia, photophobia, or nausea. Notes a lot of neck pain as well.  Takes Lyrica 50 mg TID for prevention and Tramadol as needed which do not help much.  Headache days per month: 30 Headache free days per month: 0  Current Treatment: Abortive Tramadol  Preventative Lyrica 50 mg TID  Prior Therapies                                 Depakote 250 mg TID Gabapentin 300 mg TID Cymbalta 30 mg daily Lyrica 50 mg TID Tramadol 50 mg TID  Headache Risk Factors: Headache risk factors and/or co-morbidities (+) Neck Pain   LABS: CBC    Component Value Date/Time   WBC 7.0 04/15/2017 1015   RBC 4.04 04/15/2017 1015   HGB 11.2 (L) 04/23/2017 1636   HCT 33.0 (L) 04/23/2017 1636   PLT 232 04/15/2017 1015   MCV 88.6 04/15/2017 1015   MCH 27.2 04/15/2017 1015   MCHC 30.7 04/15/2017 1015   RDW 15.1 04/15/2017 1015   LYMPHSABS 1.1 04/15/2017 1015   MONOABS 0.5 04/15/2017 1015   EOSABS 0.2 04/15/2017 1015   BASOSABS 0.0 04/15/2017 1015   BMP Latest Ref Rng & Units 04/23/2017 04/18/2017 04/15/2017  Glucose 65 - 99 mg/dL 96 81 111(H)  BUN 6 - 20 mg/dL 13 16 13   Creatinine 0.44 - 1.00 mg/dL 0.80 0.76 0.79  Sodium 135 - 145 mmol/L 142 142 142   Potassium 3.5 - 5.1 mmol/L 4.4 3.5 3.2(L)  Chloride 101 - 111 mmol/L 100(L) 104 106  CO2 22 - 32 mmol/L - 28 26  Calcium 8.9 - 10.3 mg/dL - 9.0 9.1     IMAGING:  CTH 05/14/17: cerebral atrophy with mild small vessel disease, no acute process   Current Outpatient Medications on File Prior to Visit  Medication Sig Dispense Refill   acetaminophen (TYLENOL) 325 MG tablet Take 650 mg by mouth every 6 (six) hours as needed for fever.      aspirin EC 81 MG tablet Take 81 mg by mouth daily.     atorvastatin (LIPITOR) 10 MG tablet Take 10 mg by mouth at bedtime.      Cholecalciferol (VITAMIN D3) 50000 units TABS Take by mouth every 14 (fourteen) days.     citalopram (CELEXA) 20 MG tablet Take 20 mg by mouth daily.     dicyclomine (BENTYL) 10 MG capsule Take 1 capsule (10 mg total) by mouth 4 (four) times daily -  before meals and at bedtime. 40 capsule 0   ferrous sulfate 325 (65 FE) MG tablet Take 325 mg by mouth 2 (two) times daily with a meal.      levothyroxine (SYNTHROID, LEVOTHROID) 50 MCG tablet Take  50 mcg by mouth daily before breakfast.      LORazepam (ATIVAN) 0.5 MG tablet Take 0.5 mg by mouth 2 (two) times daily.     memantine (NAMENDA) 10 MG tablet Take 10 mg by mouth 2 (two) times daily.     omeprazole (PRILOSEC) 20 MG capsule Take 20 mg by mouth daily.     Probiotic CAPS Take 1 capsule by mouth daily.     QUEtiapine (SEROQUEL) 50 MG tablet Take 50 mg by mouth at bedtime.     senna-docusate (SENOKOT-S) 8.6-50 MG tablet Take 2 tablets by mouth 2 (two) times daily.     traMADol (ULTRAM) 50 MG tablet Take by mouth every 6 (six) hours as needed.     traZODone (DESYREL) 25 mg TABS tablet Take 25 mg by mouth at bedtime.     vitamin C (ASCORBIC ACID) 500 MG tablet Take 500 mg by mouth 2 (two) times daily.      No current facility-administered medications on file prior to visit.     Allergies: Allergies  Allergen Reactions   Diphenhydramine Other (See Comments)    Feels bad    Tape Dermatitis    Family History: Unable to provide family history  Past Medical History: Past Medical History:  Diagnosis Date   Anemia    Anemia    Anxiety    Anxiety and depression    Bacteremia    Cellulitis    Chronic back pain    Chronic diarrhea    Chronic right hip pain    Complication of anesthesia    Depression    DVT (deep venous thrombosis) (HCC)    Dysphagia    GERD (gastroesophageal reflux disease)    Gout    Gout    Headache    Hyperlipidemia    Hypopotassemia    Hypothyroidism    IBS (irritable bowel syndrome)    Major depressive disorder    Muscle weakness (generalized)    Osteoporosis    Pneumonia    PONV (postoperative nausea and vomiting)    Pressure ulcer    Pressure ulcer of buttock    Psychosis (HCC)    Thyroid disease    hypothyroidism    Past Surgical History Past Surgical History:  Procedure Laterality Date   COLONOSCOPY  2007   Dr. Benard Rink: Adenomatous colon polyp   ESOPHAGOGASTRODUODENOSCOPY N/A 07/09/2016   Procedure: ESOPHAGOGASTRODUODENOSCOPY (EGD);  Surgeon: Corbin Ade, MD;  [facility]: [facility];  Service: Endoscopy;  Laterality: N/A;   ESOPHAGOGASTRODUODENOSCOPY (EGD) WITH PROPOFOL N/A 04/18/2017   Procedure: ESOPHAGOGASTRODUODENOSCOPY (EGD) WITH PROPOFOL;  Surgeon: Corbin Ade, MD;  [facility]: [facility];  Service: Endoscopy;  Laterality: N/A;  10:30am   HIP ARTHROPLASTY  05/01/2011   Procedure: right ARTHROPLASTY BIPOLAR HIP;  Surgeon: Fuller Canada, MD;  Location: [facility];  Service: Orthopedics;  Laterality: Right;   INCISION AND DRAINAGE HIP  02/08/2012   Procedure: IRRIGATION AND DEBRIDEMENT HIP;  Surgeon: Vickki Hearing, MD;  Location: [facility];  Service: Orthopedics;  Laterality: Right;   INCISION AND DRAINAGE OF WOUND  05/24/2011   Procedure: IRRIGATION AND DEBRIDEMENT WOUND;  Surgeon: Fuller Canada, MD;  Location: [facility];  Service: Orthopedics;  Laterality: Right;   MALONEY DILATION N/A 04/18/2017    Procedure: Elease Hashimoto DILATION;  Surgeon: Corbin Ade, MD;  [facility]: [facility];  Service: Endoscopy;  Laterality: N/A;    Social History: Social History   Tobacco Use   Smoking status: Never   Smokeless  tobacco: Never  Vaping Use   Vaping Use: Never used  Substance Use Topics   Alcohol use: No   Drug use: No   ROS: Negative for fevers, chills. Positive for headaches. All other systems reviewed and negative unless stated otherwise in HPI.   Physical Exam:   Vital Signs: BP (!) 141/67    Pulse (!) 59    Ht 5\' 7"  (1.702 m)    Wt 140 lb (63.5 kg)    BMI 21.93 kg/m  GENERAL: well appearing,in no acute distress,alert SKIN:  Color, texture, turgor normal. No rashes or lesions HEAD:  Normocephalic/atraumatic. CV:  RRR RESP: Normal respiratory effort MSK: +tenderness to palpation over bilateral temples, occiput, neck, and shoulders with limited cervical ROM turning head to the right  NEUROLOGICAL: Mental Status: Alert, provides one word answers and follows simple commands Cranial Nerves: PERRL,visual fields intact to confrontation,extraocular movements intact,facial sensation intact,no facial droop or ptosis,hearing intact to finger rub bilaterally,no dysarthria Motor: muscle strength 5/5 both upper extremities and LLE, 4/5 hip flexion RLE (limited by pain, hx of hip replacement) Reflexes: 2+ throughout Sensation: intact to light touch all 4 extremities Coordination: Finger-to- nose-finger intact bilaterally Gait: in wheelchair   IMPRESSION: 75 year old female with a history of depression with psychosis, hypothyroidism, ?dementia (on namenda) who presents for evaluation of daily headaches. It is difficult to characterize headaches as patient is a poor historian and cannot provide much detail. Neck and shoulders are tender to palpation and she does have limited cervical range of motion. Will increase Lyrica and see if she can get neck PT at her facility. If no improvement can  try to treat for migraine as she does mention seeing spots occasionally with her headaches. Treatment options are limited due to age and comorbidities.  PLAN: -Increase Lyrica to 50/50/75 for one week. If no side effects can increase to 75/50/75 -Neck PT -next steps: consider occipital nerve block, CGRP, or Botox  I spent a total of 39 minutes chart reviewing and counseling the patient. Discussed treatment options including preventive and acute medications, natural supplements, and physical therapy. Discussed medication side effects, adverse reactions and drug interactions. Written educational materials and patient instructions outlining all of the above were given.  Follow-up: 3 months   Genia Harold, MD 03/16/2021   4:12 PM

## 2021-04-11 ENCOUNTER — Ambulatory Visit (HOSPITAL_COMMUNITY): Payer: Medicare (Managed Care)

## 2021-04-11 ENCOUNTER — Inpatient Hospital Stay (HOSPITAL_COMMUNITY): Admission: RE | Admit: 2021-04-11 | Payer: Medicare (Managed Care) | Source: Ambulatory Visit

## 2021-04-13 ENCOUNTER — Other Ambulatory Visit: Payer: Self-pay

## 2021-04-13 ENCOUNTER — Ambulatory Visit (HOSPITAL_COMMUNITY)
Admission: RE | Admit: 2021-04-13 | Discharge: 2021-04-13 | Disposition: A | Discharge status: Home / Self Care | Payer: Medicare (Managed Care) | Source: Ambulatory Visit | Attending: Internal Medicine | Admitting: Internal Medicine

## 2021-04-13 DIAGNOSIS — N644 Mastodynia: Secondary | ICD-10-CM | POA: Diagnosis not present

## 2021-04-13 DIAGNOSIS — N63 Unspecified lump in unspecified breast: Secondary | ICD-10-CM

## 2021-04-13 DIAGNOSIS — N6310 Unspecified lump in the right breast, unspecified quadrant: Secondary | ICD-10-CM | POA: Diagnosis present

## 2021-06-21 ENCOUNTER — Ambulatory Visit (INDEPENDENT_AMBULATORY_CARE_PROVIDER_SITE_OTHER): Payer: Medicare (Managed Care) | Admitting: Psychiatry

## 2021-06-21 ENCOUNTER — Encounter: Payer: Self-pay | Admitting: Psychiatry

## 2021-06-21 ENCOUNTER — Other Ambulatory Visit: Payer: Self-pay

## 2021-06-21 VITALS — BP 114/66 | HR 58 | Ht 67.0 in

## 2021-06-21 DIAGNOSIS — G43019 Migraine without aura, intractable, without status migrainosus: Secondary | ICD-10-CM

## 2021-06-21 MED ORDER — AIMOVIG 70 MG/ML ~~LOC~~ SOAJ: 70.0000 mg | SUBCUTANEOUS | 3 refills | Status: DC

## 2021-06-21 NOTE — Progress Notes (Signed)
? ?  CC:  headaches ? ?Follow-up Visit ? ?Last visit: 03/16/21 ? ?Brief HPI: ?76 year old female with a history of depression with psychosis, hypothyroidism, dementia (on namenda) who follows in clinic for chronic daily headaches. ? ?At her last visit, Lyrica was increased to 75/50/75. Was recommended to start neck PT (has physical therapy at her facility). ? ?Interval History: ?Since her last visit she has not noticed improvement in her headaches. States headaches are constant. They are described as sharp and throbbing pain with associated nausea, photophobia, and phonophobia. Headaches are severe enough that she will need to lie down. Lyrica does not seem to help, and it will sometimes make her drowsy. ? ?States she has not gotten physical therapy. Her facility paperwork documents that she has received PT. She is a very poor historian. ? ?Headache days per month: 30 ?Headache free days per month: 0 ? ?Current Headache Regimen: ?Preventative: Lyrica 50/75/50 ?Abortive: Tylenol, Tramadol (from outside prescriber) ? ?Prior Therapies                                  ?Depakote 250 mg TID ?Gabapentin 300 mg TID ?Cymbalta 30 mg daily ?Lyrica 75/50/75 mg TID - drowsiness ?Tramadol 50 mg TID ? ?Physical Exam:  ? ?Vital Signs: ?BP 114/66   Pulse (!) 58   Ht 5\' 7"  (1.702 m)   BMI 21.93 kg/m?  ?GENERAL:  well appearing, in no acute distress, alert  ?SKIN:  Color, texture, turgor normal. No rashes or lesions ?HEAD:  Normocephalic/atraumatic. ?RESP: normal respiratory effort ?MSK:  +tenderness to palpation over bilateral neck and shoulders ? ?NEUROLOGICAL: ?Mental Status: Oriented to self, poor historian, follows simple commands ?Cranial Nerves: PERRL, face symmetric, no dysarthria, hearing grossly intact ?Motor: moves all extremities equally ?Gait: uses wheelchair ? ?IMPRESSION: ?76 year old female with a history of depression with psychosis, dementia, hypothyroidism who follows in clinic for daily headaches. Today she does  report migrainous features with her headaches. She has previously failed multiple migraine medications due to side effects or lack of efficacy. Would avoid increasing Lyrica at this time as it makes her drowsy at her current dose. Will start Aimovig for migraine prevention. If no improvement with headaches despite treatment will consider obtaining updated head imaging (last CTH was in 2019) ? ?PLAN: ?-Prevention: Start Aimovig 70 mg monthly for headache prevention. Continue Lyrica 75/50/75 ?-next steps: consider MRI brain if headaches persist despite treatment with Aimovig, consider Botox or occipital nerve block ? ? ?Follow-up: 4 months ? ?I spent a total of 26 minutes on the date of the service. Headache education was done. Discussed treatment options including preventive  medications. Discussed medication side effects, adverse reactions and drug interactions. Written educational materials and patient instructions outlining all of the above were given. ? ?2020, MD ?06/21/21 ?11:31 AM ? ?

## 2021-06-21 NOTE — Patient Instructions (Signed)
Start Aimovig 70 mg per month for headache prevention ?

## 2021-06-21 NOTE — Telephone Encounter (Signed)
error 

## 2021-10-12 ENCOUNTER — Ambulatory Visit (INDEPENDENT_AMBULATORY_CARE_PROVIDER_SITE_OTHER): Payer: Medicare (Managed Care) | Admitting: Podiatry

## 2021-10-12 ENCOUNTER — Ambulatory Visit (INDEPENDENT_AMBULATORY_CARE_PROVIDER_SITE_OTHER): Payer: Medicare (Managed Care)

## 2021-10-12 DIAGNOSIS — M79675 Pain in left toe(s): Secondary | ICD-10-CM | POA: Diagnosis not present

## 2021-10-12 DIAGNOSIS — M21619 Bunion of unspecified foot: Secondary | ICD-10-CM

## 2021-10-12 DIAGNOSIS — M21611 Bunion of right foot: Secondary | ICD-10-CM

## 2021-10-12 DIAGNOSIS — B351 Tinea unguium: Secondary | ICD-10-CM

## 2021-10-12 DIAGNOSIS — B353 Tinea pedis: Secondary | ICD-10-CM | POA: Diagnosis not present

## 2021-10-12 DIAGNOSIS — M21612 Bunion of left foot: Secondary | ICD-10-CM | POA: Diagnosis not present

## 2021-10-12 DIAGNOSIS — M79674 Pain in right toe(s): Secondary | ICD-10-CM | POA: Diagnosis not present

## 2021-10-12 MED ORDER — KETOCONAZOLE 2 % EX CREA: 1.0000 | TOPICAL_CREAM | Freq: Every day | CUTANEOUS | 0 refills | Status: DC

## 2021-10-12 NOTE — Progress Notes (Signed)
Subjective:   Patient ID: [name], female   DOB: 76 y.o.   MRN: [id number]   HPI Chief Complaint  Patient presents with   Bunions    Pt came in today with right foot bunion and nail fungus    76 year old female presents the office with above complaint.  She states she has been dealing with the bunion for a long time. No open lesion she notes. No recent injury she knows of.  She said the nails do get tender at times as they are thickened.  No treatment for the feet.   Review of Systems  All other systems reviewed and are negative.  Past Medical History:  Diagnosis Date   Anemia    Anemia    Anxiety    Anxiety and depression    Bacteremia    Cellulitis    Chronic back pain    Chronic diarrhea    Chronic right hip pain    Complication of anesthesia    Depression    DVT (deep venous thrombosis) (HCC)    Dysphagia    GERD (gastroesophageal reflux disease)    Gout    Gout    Headache    Hyperlipidemia    Hypopotassemia    Hypothyroidism    IBS (irritable bowel syndrome)    Major depressive disorder    Muscle weakness (generalized)    Osteoporosis    Pneumonia    PONV (postoperative nausea and vomiting)    Pressure ulcer    Pressure ulcer of buttock    Psychosis (HCC)    Thyroid disease    hypothyroidism    Past Surgical History:  Procedure Laterality Date   COLONOSCOPY  2007   Dr. Benard Rink: Adenomatous colon polyp   ESOPHAGOGASTRODUODENOSCOPY N/A 07/09/2016   Procedure: ESOPHAGOGASTRODUODENOSCOPY (EGD);  Surgeon: Corbin Ade, MD;  [facility];  Service: Endoscopy;  Laterality: N/A;   ESOPHAGOGASTRODUODENOSCOPY (EGD) WITH PROPOFOL N/A 04/18/2017   Procedure: ESOPHAGOGASTRODUODENOSCOPY (EGD) WITH PROPOFOL;  Surgeon: Corbin Ade, MD;  [facility];  Service: Endoscopy;  Laterality: N/A;  10:30am   HIP ARTHROPLASTY  05/01/2011   Procedure: right ARTHROPLASTY BIPOLAR HIP;  Surgeon: Fuller Canada, MD;  Location: [facility];  Service:  Orthopedics;  Laterality: Right;   INCISION AND DRAINAGE HIP  02/08/2012   Procedure: IRRIGATION AND DEBRIDEMENT HIP;  Surgeon: Vickki Hearing, MD;  Location: [facility];  Service: Orthopedics;  Laterality: Right;   INCISION AND DRAINAGE OF WOUND  05/24/2011   Procedure: IRRIGATION AND DEBRIDEMENT WOUND;  Surgeon: Fuller Canada, MD;  Location: [facility];  Service: Orthopedics;  Laterality: Right;   MALONEY DILATION N/A 04/18/2017   Procedure: Elease Hashimoto DILATION;  Surgeon: Corbin Ade, MD;  [facility];  Service: Endoscopy;  Laterality: N/A;     Current Outpatient Medications:    ketoconazole (NIZORAL) 2 % cream, Apply 1 Application topically daily., Disp: 60 g, Rfl: 0   acetaminophen (TYLENOL) 325 MG tablet, Take 650 mg by mouth Stewart 6 (six) hours as needed for fever. , Disp: , Rfl:    aspirin EC 81 MG tablet, Take 81 mg by mouth daily., Disp: , Rfl:    atorvastatin (LIPITOR) 10 MG tablet, Take 10 mg by mouth at bedtime. , Disp: , Rfl:    busPIRone (BUSPAR) 15 MG tablet, Take 15 mg by mouth. Give 1/2 tab 3 x day, Disp: , Rfl:    Cholecalciferol (VITAMIN D3) 50000 units TABS, Take by mouth Stewart 14 (  fourteen) days., Disp: , Rfl:    citalopram (CELEXA) 20 MG tablet, Take 20 mg by mouth daily., Disp: , Rfl:    dicyclomine (BENTYL) 20 MG tablet, Take 20 mg by mouth 4 (four) times daily., Disp: , Rfl:    Erenumab-aooe (AIMOVIG) 70 MG/ML SOAJ, Inject 70 mg into the skin Stewart 30 (thirty) days., Disp: 1.12 mL, Rfl: 3   ferrous sulfate 325 (65 FE) MG tablet, Take 325 mg by mouth 2 (two) times daily with a meal. , Disp: , Rfl:    levothyroxine (SYNTHROID, LEVOTHROID) 50 MCG tablet, Take 50 mcg by mouth daily before breakfast. , Disp: , Rfl:    LORazepam (ATIVAN) 0.5 MG tablet, Take 0.5 mg by mouth 2 (two) times daily., Disp: , Rfl:    memantine (NAMENDA) 10 MG tablet, Take 10 mg by mouth 2 (two) times daily., Disp: , Rfl:    OLANZapine (ZYPREXA) 5 MG tablet, Take 5 mg by mouth at  bedtime., Disp: , Rfl:    omeprazole (PRILOSEC) 20 MG capsule, Take 20 mg by mouth daily., Disp: , Rfl:    pregabalin (LYRICA) 50 MG capsule, Take 50 mg in AM, 50 mg in the afternoon, and 75 mg in PM. If no side effects after one week can increase to 75 mg in the morning, 50 mg in the afternoon, and 75 mg in PM, Disp: 180 capsule, Rfl: 2   pregabalin (LYRICA) 75 MG capsule, Take 75 mg by mouth 2 (two) times daily., Disp: , Rfl:    Probiotic CAPS, Take 1 capsule by mouth daily. (Patient not taking: Reported on 06/21/2021), Disp: , Rfl:    QUEtiapine (SEROQUEL) 50 MG tablet, Take 50 mg by mouth at bedtime. (Patient not taking: Reported on 06/21/2021), Disp: , Rfl:    senna-docusate (SENOKOT-S) 8.6-50 MG tablet, Take 2 tablets by mouth 2 (two) times daily. (Patient not taking: Reported on 06/21/2021), Disp: , Rfl:    traMADol (ULTRAM) 50 MG tablet, Take by mouth Stewart 6 (six) hours as needed., Disp: , Rfl:    traZODone (DESYREL) 25 mg TABS tablet, Take 25 mg by mouth at bedtime., Disp: , Rfl:    vitamin C (ASCORBIC ACID) 500 MG tablet, Take 500 mg by mouth 2 (two) times daily. , Disp: , Rfl:   Allergies  Allergen Reactions   Diphenhydramine Other (See Comments)    Feels bad   Tape Dermatitis           Objective:  Physical Exam  General: NAD-presents by herself  Dermatological: Nails are hypertrophic, dystrophic with yellow, brown discoloration.  No drainage or pus or swelling to the toenail sites.  Tenderness nails 1-5 bilaterally.  There is dry, scaly, red rash on the plantar aspects of bilateral feet.  Preulcerative lesion on the medial first metatarsal head along the area of bunion but no definitive skin breakdown  Vascular: Dorsalis Pedis artery and Posterior Tibial artery pedal pulses are palpable bilateral with immedate capillary fill time. There is no pain with calf compression, swelling, warmth, erythema.   Neruologic: Grossly intact via light touch bilateral.   Musculoskeletal:  Significant bunion present in the right foot with mild erythema like from irritation site shoes there is no drainage or pus.  Muscular strength 5/5 in all groups tested bilateral.       Gait: Unassisted, Nonantalgic.       Assessment:   Right foot bunion, preulcerative lesion; skin rash; symptomatic onychomycosis     Plan:  -Treatment options discussed including all  alternatives, risks, and complications -Etiology of symptoms were discussed -Nails debrided 10 without complications or bleeding. -Ketonazole -Offloading pads for the bunion-she is to monitor this very closely for any further skin breakdown or irritation. -Daily foot inspection -Follow-up in 3 months or sooner if any problems arise. In the meantime, encouraged to call the office with any questions, concerns, change in symptoms.   Ovid Curd, DPM

## 2021-10-18 ENCOUNTER — Telehealth: Payer: Self-pay | Admitting: *Deleted

## 2021-10-18 MED ORDER — KETOCONAZOLE 2 % EX CREA: 1.0000 | TOPICAL_CREAM | Freq: Every day | CUTANEOUS | 0 refills | Status: DC

## 2021-10-18 NOTE — Telephone Encounter (Signed)
Patient is a resident at [facility], new pharmacy is [facility]-(has been updated in chart and resent to new pharmacy).

## 2021-11-14 ENCOUNTER — Encounter: Payer: Self-pay | Admitting: Psychiatry

## 2021-11-14 ENCOUNTER — Ambulatory Visit (INDEPENDENT_AMBULATORY_CARE_PROVIDER_SITE_OTHER): Payer: Medicare (Managed Care) | Admitting: Psychiatry

## 2021-11-14 ENCOUNTER — Telehealth: Payer: Self-pay | Admitting: Psychiatry

## 2021-11-14 VITALS — BP 143/63 | HR 59

## 2021-11-14 DIAGNOSIS — R519 Headache, unspecified: Secondary | ICD-10-CM

## 2021-11-14 MED ORDER — AIMOVIG 140 MG/ML ~~LOC~~ SOAJ
140.0000 mg | SUBCUTANEOUS | 3 refills | Status: DC
Start: 2021-11-14 — End: 2021-11-14

## 2021-11-14 MED ORDER — AIMOVIG 140 MG/ML ~~LOC~~ SOAJ: 140.0000 mg | SUBCUTANEOUS | 3 refills | Status: DC

## 2021-11-14 NOTE — Progress Notes (Signed)
   CC:  headaches  Follow-up Visit  Last visit: 06/21/21  Brief HPI: 76 year old female with a history of depression with psychosis, hypothyroidism, dementia (on namenda) who follows in clinic for chronic daily headaches.   At her last visit she was started on Aimovig 70 mg monthly. Lyrica was continued at 75/50/75.  Interval History: She continues to have daily headaches. They can be bifrontal or occipital and are associated with phonophobia. She continues to have frequent neck pain. She is a poor historian and comes alone today.   Headache days per month: 30 Headache free days per month: 0  Current Headache Regimen: Preventative: Lyrica 50/75/50, Aimovig 70 mg monthly Abortive: Tylenol, Tramadol (from outside provider)  Prior Therapies                                  Depakote 250 mg TID Gabapentin 300 mg TID Cymbalta 30 mg daily Lyrica 75/50/75 mg TID - drowsiness Tramadol 50 mg TID Aimovig 70 mg   Physical Exam:   Vital Signs: BP (!) 143/63   Pulse (!) 59  GENERAL:  well appearing, in no acute distress, alert  SKIN:  Color, texture, turgor normal. No rashes or lesions HEAD:  Normocephalic/atraumatic. RESP: normal respiratory effort MSK:  No gross joint deformities.   NEUROLOGICAL: Mental Status: Oriented to self, follows simple commands Cranial Nerves: PERRL, face symmetric, no dysarthria, hearing grossly intact Motor: 3/5 strength right hip flexion (limited by hip pain), otherwise moves all extremities equally Gait: deferred, patient is in wheelchair  IMPRESSION: 76 year old female with a history of depression with psychosis, hypothyroidism, dementia (on namenda) who presents for follow up of daily headaches. It is difficult to characterize her headaches as she is a poor historian, though she does report some migrainous features. She has not noticed improvement with Aimovig. Will increase dose to 140 mg monthly and continue Lyrica at its current dose. MRI brain  ordered for worsening headaches despite treatment.  PLAN: -MRI brain -Increase Aimovig to 140 mg monthly -Continue Lyrica 75/50/75  Follow-up: 3 months  I spent a total of 23 minutes on the date of the service. Discussed medication side effects, adverse reactions and drug interactions. Written educational materials and patient instructions outlining all of the above were given.  Ocie Doyne, MD 11/14/21 2:44 PM

## 2021-11-14 NOTE — Telephone Encounter (Signed)
medicaid NPR sent to GI they will call the patient to schedule 

## 2021-11-16 ENCOUNTER — Other Ambulatory Visit: Payer: Self-pay

## 2021-11-16 MED ORDER — AIMOVIG 140 MG/ML ~~LOC~~ SOAJ: 140.0000 mg | SUBCUTANEOUS | 3 refills | Status: DC

## 2021-12-15 ENCOUNTER — Ambulatory Visit (HOSPITAL_COMMUNITY)
Admission: RE | Admit: 2021-12-15 | Discharge: 2021-12-15 | Disposition: A | Discharge status: Home / Self Care | Payer: Medicare (Managed Care) | Source: Ambulatory Visit | Attending: Psychiatry | Admitting: Psychiatry

## 2021-12-15 DIAGNOSIS — R519 Headache, unspecified: Secondary | ICD-10-CM | POA: Diagnosis present

## 2021-12-15 MED ORDER — GADOPICLENOL 0.5 MMOL/ML IV SOLN
6.0000 mL | Freq: Once | INTRAVENOUS | Status: AC | PRN
  Administered 2021-12-15: 6 mL via INTRAVENOUS

## 2021-12-18 ENCOUNTER — Telehealth: Payer: Self-pay

## 2021-12-18 NOTE — Telephone Encounter (Signed)
Called [facility] on file. [phone] which is [facility] that pt is located. Spoke with them and need results faxed to office.

## 2021-12-18 NOTE — Telephone Encounter (Signed)
-----   Message from Genia Harold, MD sent at 12/18/2021 10:09 AM EDT ----- MRI shows a small meningioma in the right frontal lobe. This is likely too small to be causing any symptoms. I will repeat an MRI in 6 months to monitor it and make sure it does not grow bigger.  She also has a couple of old strokes on the MRI. She should continue to take daily aspirin and atorvastatin to prevent future strokes

## 2022-01-12 ENCOUNTER — Ambulatory Visit (INDEPENDENT_AMBULATORY_CARE_PROVIDER_SITE_OTHER): Payer: Medicare (Managed Care) | Admitting: Podiatry

## 2022-01-12 DIAGNOSIS — M21619 Bunion of unspecified foot: Secondary | ICD-10-CM

## 2022-01-12 DIAGNOSIS — B351 Tinea unguium: Secondary | ICD-10-CM

## 2022-01-12 NOTE — Patient Instructions (Signed)
For the bunion wear a bunion protector. Monitor for any skin breakdown or signs of infection  Keep moisturizer on the feet daily. Do not apply between the toes.   If there is a podiatrist that comes to trim her nails she does not need to come to see me for that. If the pain is not better or if there are any ulcerations or signs of infection, call [phone]

## 2022-01-12 NOTE — Progress Notes (Signed)
Subjective: Chief Complaint  Patient presents with   Bunions    Right foot bunion, patient is still having pain, nail trim    76 year old female with above concerns.  States the pain is still causing discomfort.  She states they have not been keeping a pad on this or any offloading.  No skin breakdown.    Objective: AAO x3, NAD DP/PT pulses palpable bilaterally, CRT less than 3 seconds Bunion present bilaterally, right side there is a hyperkeratotic lesion noted along the area of the bunion without any underlying ulceration drainage or any signs of infection.  There is no fluctuation or crepitation for this matter. Nails are hypertrophic and dystrophic and mildly elongated but not causing the pain today. Dry skin present bilaterally without any skin breakdown. No pain with calf compression, swelling, warmth, erythema  Assessment: Bunion resulted in preulcerative callus  Plan: -All treatment options discussed with the patient including all alternatives, risks, complications.  -She will be debrided the callus on the right bunion without any complications or bleeding.  Dispensed offloading pads.  Order was written to apply this weekly in shoes.  Also written order for moisturizer to her feet. -As courtesy debride the nails with any complications of bleeding but there is very minimal today. She does have a podiatrist that change her nails aggressively.  Will defer to them for this.  -Patient encouraged to call the office with any questions, concerns, change in symptoms.   Vivi Barrack DPM

## 2022-03-07 ENCOUNTER — Other Ambulatory Visit (HOSPITAL_COMMUNITY): Payer: Self-pay | Admitting: Family Medicine

## 2022-03-07 DIAGNOSIS — Z1231 Encounter for screening mammogram for malignant neoplasm of breast: Secondary | ICD-10-CM

## 2022-04-16 ENCOUNTER — Ambulatory Visit (HOSPITAL_COMMUNITY): Payer: Medicare (Managed Care)

## 2022-04-18 ENCOUNTER — Other Ambulatory Visit: Payer: Self-pay | Admitting: Family Medicine

## 2022-04-18 DIAGNOSIS — Z1231 Encounter for screening mammogram for malignant neoplasm of breast: Secondary | ICD-10-CM

## 2022-06-18 ENCOUNTER — Other Ambulatory Visit: Payer: Self-pay | Admitting: *Deleted

## 2022-06-18 ENCOUNTER — Other Ambulatory Visit: Payer: Self-pay | Admitting: Psychiatry

## 2022-06-18 DIAGNOSIS — D329 Benign neoplasm of meninges, unspecified: Secondary | ICD-10-CM

## 2022-06-18 NOTE — Telephone Encounter (Signed)
Error opened wrong chart.

## 2022-06-19 ENCOUNTER — Telehealth: Payer: Self-pay | Admitting: *Deleted

## 2022-06-19 NOTE — Telephone Encounter (Signed)
-----   Message from Genia Harold, MD sent at 06/18/2022 10:02 AM EDT ----- Regarding: mri Please let patient know I have ordered an MRI of her brain for monitoring of her meningioma, thanks

## 2022-06-19 NOTE — Telephone Encounter (Signed)
Called [phone]/[facility] facility. Chose option 0 for receptionist. Spoke w/ Marden Noble. She transferred me to nurse, Jeani Hawking.  Can fax order to them at [phone]. AttnDelcie Roch.  Faxed signed order, received fax confirmation.

## 2022-07-16 ENCOUNTER — Ambulatory Visit (HOSPITAL_COMMUNITY): Payer: Medicare (Managed Care)

## 2022-07-16 NOTE — Telephone Encounter (Signed)
I sent a fax request for the MRI authorization to [facility].

## 2022-07-19 NOTE — Telephone Encounter (Signed)
[employer] Berkley Harvey: [phone] exp. 07/19/22-10/25/22

## 2022-07-27 ENCOUNTER — Ambulatory Visit (HOSPITAL_COMMUNITY)
Admission: RE | Admit: 2022-07-27 | Discharge: 2022-07-27 | Disposition: A | Discharge status: Home / Self Care | Payer: Medicare (Managed Care) | Source: Ambulatory Visit | Attending: Psychiatry | Admitting: Psychiatry

## 2022-07-27 DIAGNOSIS — D329 Benign neoplasm of meninges, unspecified: Secondary | ICD-10-CM | POA: Insufficient documentation

## 2022-07-27 MED ORDER — GADOBUTROL 1 MMOL/ML IV SOLN
6.0000 mL | Freq: Once | INTRAVENOUS | Status: AC | PRN
  Administered 2022-07-27: 6 mL via INTRAVENOUS

## 2022-07-27 NOTE — Progress Notes (Signed)
Pt has assisted decent from wheelchair to floor. Patient stated that she could stand prior to attempting to transfer patient from wheelchair to MRI table. Two staff assisted transfer and discovered that patient could not stand. Unable to lift enough for transfer. Got assistance and got patient onto the table. RN at [facility] assisted living notified as she was an outpatient.

## 2022-08-06 ENCOUNTER — Telehealth: Payer: Self-pay | Admitting: *Deleted

## 2022-08-06 NOTE — Telephone Encounter (Signed)
Called [phone]/[facility] facility. Spoke w/ Feliciana Rossetti. She transferred me to nurse, Nettie Elm. Relayed results. Faxed copy of results to them at [phone]. Received fax confirmation.  Also scheduled pt follow up with Dr. Delena Bali for 02/13/23 at 1:30pm. She did not have anything scheduled. Also added to wait list.

## 2022-08-06 NOTE — Telephone Encounter (Signed)
-----   Message from Arther Abbott, RN sent at 08/03/2022  8:29 AM EDT -----  ----- Message ----- From: Melvyn Novas, MD Sent: 08/02/2022   5:12 PM EDT To: Arther Abbott, RN; Gna-Pod 3 Results  Unchanged meningeoma size without swelling or clinical symptoms.  Good result, no intervention necessary.

## 2022-10-31 ENCOUNTER — Encounter: Payer: Self-pay | Admitting: Psychiatry

## 2022-10-31 ENCOUNTER — Telehealth: Payer: Self-pay | Admitting: Psychiatry

## 2022-10-31 NOTE — Telephone Encounter (Signed)
Unable to reach pt over the phone, no VM box. Sent letter in mail informing pt of need to reschedule 02/13/23 appt - MD leaving practice

## 2023-02-13 ENCOUNTER — Ambulatory Visit (INDEPENDENT_AMBULATORY_CARE_PROVIDER_SITE_OTHER): Payer: Medicare (Managed Care) | Admitting: Neurology

## 2023-02-13 ENCOUNTER — Encounter: Payer: Self-pay | Admitting: Neurology

## 2023-02-13 ENCOUNTER — Ambulatory Visit: Payer: Medicare (Managed Care) | Admitting: Psychiatry

## 2023-02-13 VITALS — BP 101/41 | HR 51 | Ht 67.0 in

## 2023-02-13 DIAGNOSIS — Z9189 Other specified personal risk factors, not elsewhere classified: Secondary | ICD-10-CM | POA: Diagnosis not present

## 2023-02-13 DIAGNOSIS — R519 Headache, unspecified: Secondary | ICD-10-CM

## 2023-02-13 DIAGNOSIS — Z79899 Other long term (current) drug therapy: Secondary | ICD-10-CM

## 2023-02-13 DIAGNOSIS — Z789 Other specified health status: Secondary | ICD-10-CM

## 2023-02-13 NOTE — Progress Notes (Signed)
Subjective:    Patient ID: [name] is a 77 y.o. female.  HPI    Terri Foley, MD, PhD [facility] [address] [address] [place], [place] [address]  Ms. [name] is a 77 year old female with an underlying complex medical history of anemia, cellulitis, chronic back pain, history of DVT, reflux disease, gout, hypothyroidism, hyperlipidemia, irritable bowel syndrome, anxiety, depression, and recurrent headaches, who presents for follow-up consultation of her headaches.  The patient is accompanied by [name]/transportation today. She was previously followed by Dr. Ocie Stewart and was last seen by Dr. Delena Stewart on 11/14/2021.  I reviewed the office visit note and copied the note below for reference.  She was advised to continue with Aimovig injections, the dose was increased 240 mg monthly.  She was advised to proceed with a brain MRI.  Her Lyrica was continued at the current dose of 75 mg in the morning, 50 mg midday, and 75 mg in the evening.  She had a brain MRI with and without contrast on 07/27/2022 and I reviewed the results:   IMPRESSION: 1. Unchanged size and appearance of a 6 mm meningioma overlying the mid right frontal lobe. 2. No acute intracranial process.      The patient was notified of the test results through a phone call to her nursing home.  Today, 02/13/2023: She reports recurrent headaches, she reports nearly daily sharp headaches, they are primarily in the front, on both sides, no nausea or vomiting typically.  She does not typically have light sensitivity but sometimes.  She does not provide much in the way of details, does not sound like she has throbbing headaches or one-sided headaches or associated neurological accompaniments.  She has a lot of medications, she is on multiple potentially sedating medications, she takes Seroquel, Ativan, Zyprexa, Lyrica, Celexa, trazodone.  She is on Aimovig injections 140 mg every 30  days.  She has not fallen recently but reports that she cannot stand or walk.  She is in a wheelchair today.  She does not drink a whole lot of water, she is not sure how much water she drinks at all.  She likes to drink caffeine in the form of coffee, about 2 cups/day, 1 serving of tea daily and 2 bottles of soda daily.  Records from her nursing home including [facility] were reviewed, nursing home consultation sheet was filled out.  Previously:  11/14/2021 (Dr. Delena Stewart): << CC:  headaches   Follow-up Visit   Last visit: 06/21/21   Brief HPI: 77 year old female with a history of depression with psychosis, hypothyroidism, dementia (on namenda) who follows in clinic for chronic daily headaches.   At her last visit she was started on Aimovig 70 mg monthly. Lyrica was continued at 75/50/75.   Interval History: She continues to have daily headaches. They can be bifrontal or occipital and are associated with phonophobia. She continues to have frequent neck pain. She is a poor historian and comes alone today.     Headache days per month: 30 Headache free days per month: 0   Current Headache Regimen: Preventative: Lyrica 50/75/50, Aimovig 70 mg monthly Abortive: Tylenol, Tramadol (from outside provider)   Prior Therapies                                  Depakote 250 mg TID Gabapentin 300 mg TID Cymbalta 30 mg daily Lyrica  75/50/75 mg TID - drowsiness Tramadol 50 mg TID Aimovig 70 mg  >>  Her Past Medical History Is Significant For: Past Medical History:  Diagnosis Date   Anemia    Anemia    Anxiety    Anxiety and depression    Bacteremia    Cellulitis    Chronic back pain    Chronic diarrhea    Chronic right hip pain    Complication of anesthesia    Depression    DVT (deep venous thrombosis) (HCC)    Dysphagia    GERD (gastroesophageal reflux disease)    Gout    Gout    Headache    Hyperlipidemia    Hypopotassemia    Hypothyroidism    IBS (irritable bowel syndrome)    Major  depressive disorder    Muscle weakness (generalized)    Osteoporosis    Pneumonia    PONV (postoperative nausea and vomiting)    Pressure ulcer    Pressure ulcer of buttock    Psychosis (HCC)    Thyroid disease    hypothyroidism    Her Past Surgical History Is Significant For: Past Surgical History:  Procedure Laterality Date   COLONOSCOPY  2007   Dr. Benard Rink: Adenomatous colon polyp   ESOPHAGOGASTRODUODENOSCOPY N/A 07/09/2016   Procedure: ESOPHAGOGASTRODUODENOSCOPY (EGD);  Surgeon: Corbin Ade, MD;  [facility]: [facility];  Service: Endoscopy;  Laterality: N/A;   ESOPHAGOGASTRODUODENOSCOPY (EGD) WITH PROPOFOL N/A 04/18/2017   Procedure: ESOPHAGOGASTRODUODENOSCOPY (EGD) WITH PROPOFOL;  Surgeon: Corbin Ade, MD;  Location: [facility];  Service: Endoscopy;  Laterality: N/A;  10:30am   HIP ARTHROPLASTY  05/01/2011   Procedure: right ARTHROPLASTY BIPOLAR HIP;  Surgeon: Fuller Canada, MD;  Location: [facility];  Service: Orthopedics;  Laterality: Right;   INCISION AND DRAINAGE HIP  02/08/2012   Procedure: IRRIGATION AND DEBRIDEMENT HIP;  Surgeon: Vickki Hearing, MD;  Location: [facility];  Service: Orthopedics;  Laterality: Right;   INCISION AND DRAINAGE OF WOUND  05/24/2011   Procedure: IRRIGATION AND DEBRIDEMENT WOUND;  Surgeon: Fuller Canada, MD;  Location: [facility];  Service: Orthopedics;  Laterality: Right;   MALONEY DILATION N/A 04/18/2017   Procedure: Elease Hashimoto DILATION;  Surgeon: Corbin Ade, MD;  Location: [facility];  Service: Endoscopy;  Laterality: N/A;    Her Family History Is Significant For: Family History  Problem Relation Age of Onset   Migraines Mother     Her Social History Is Significant For: Social History   Socioeconomic History   Marital status: Single    Spouse name: Not on file   Number of children: Not on file   Years of education: Not on file   Highest education level: Not on file  Occupational History   Not on file  Tobacco Use   Smoking  status: Never   Smokeless tobacco: Never  Vaping Use   Vaping status: Never Used  Substance and Sexual Activity   Alcohol use: No   Drug use: No   Sexual activity: Never  Other Topics Concern   Not on file  Social History Narrative   03/16/21 lives at [facility]   Patient's legal guardian is Mr. Zebedee Iba. Phone number [phone].   Social Determinants of Health   Financial Resource Strain: Not on file  Food Insecurity: Not on file  Transportation Needs: Not on file  Physical Activity: Not on file  Stress: Not on file  Social Connections: Not on file    Her  Allergies Are:  Allergies  Allergen Reactions   Diphenhydramine Other (See Comments)    Feels bad   Tape Dermatitis  :   Her Current Medications Are:  Outpatient Encounter Medications as of 02/13/2023  Medication Sig   acetaminophen (TYLENOL) 325 MG tablet Take 650 mg by mouth every 6 (six) hours as needed for fever.    aspirin EC 81 MG tablet Take 81 mg by mouth daily.   atorvastatin (LIPITOR) 10 MG tablet Take 10 mg by mouth at bedtime.    Cholecalciferol (VITAMIN D3) 50000 units TABS Take by mouth every 14 (fourteen) days.   citalopram (CELEXA) 20 MG tablet Take 20 mg by mouth daily.   dicyclomine (BENTYL) 20 MG tablet Take 20 mg by mouth 4 (four) times daily.   Erenumab-aooe (AIMOVIG) 140 MG/ML SOAJ Inject 140 mg into the skin every 30 (thirty) days.   ferrous sulfate 325 (65 FE) MG tablet Take 325 mg by mouth 2 (two) times daily with a meal.    ketoconazole (NIZORAL) 2 % cream Apply 1 Application topically daily.   levothyroxine (SYNTHROID, LEVOTHROID) 50 MCG tablet Take 50 mcg by mouth daily before breakfast.    LORazepam (ATIVAN) 0.5 MG tablet Take 0.5 mg by mouth 2 (two) times daily.   memantine (NAMENDA) 10 MG tablet Take 10 mg by mouth 2 (two) times daily.   nystatin (MYCOSTATIN/NYSTOP) powder Apply topically.   OLANZapine (ZYPREXA) 5 MG tablet Take 5 mg by mouth at bedtime.    omeprazole (PRILOSEC) 20 MG capsule Take 20 mg by mouth daily.   pregabalin (LYRICA) 50 MG capsule Take 50 mg in AM, 50 mg in the afternoon, and 75 mg in PM. If no side effects after one week can increase to 75 mg in the morning, 50 mg in the afternoon, and 75 mg in PM   Probiotic CAPS Take 1 capsule by mouth daily.   QUEtiapine (SEROQUEL) 50 MG tablet Take 50 mg by mouth at bedtime.   senna-docusate (SENOKOT-S) 8.6-50 MG tablet Take 2 tablets by mouth 2 (two) times daily.   traMADol (ULTRAM) 50 MG tablet Take by mouth every 6 (six) hours as needed.   traZODone (DESYREL) 25 mg TABS tablet Take 25 mg by mouth at bedtime.   vitamin C (ASCORBIC ACID) 500 MG tablet Take 500 mg by mouth 2 (two) times daily.    pregabalin (LYRICA) 50 MG capsule Take 50 mg by mouth daily. (Patient not taking: Reported on 02/13/2023)   No facility-administered encounter medications on file as of 02/13/2023.  :   Review of Systems:  Out of a complete 14 point review of systems, all are reviewed and negative with the exception of these symptoms as listed below:  Review of Systems  Neurological:        Pt here for Migranes Pt states has sharp pains in head  and migraines daily     Objective:  Neurological Exam  Physical Exam Physical Examination:   Vitals:   02/13/23 0959  BP: (!) 101/41  Pulse: (!) 51    General Examination: The patient is a thin and frail appearing female in a wheelchair.  Cooperative with exam but limited history.  Adequately groomed.   HEENT: Normocephalic, atraumatic, pupils are equal, round and reactive to light, extraocular tracking is mildly impaired, mild facial masking noted, decrease in range of motion in her neck actively.  No carotid bruits.  Airway examination is limited as she does not open her mouth wide enough.  Moderate  mouth dryness, dentures in place.  Tongue protrudes centrally.  Posterior pharynx not visualized.  Hearing grossly intact.  Normal facial sensation to light  touch. Speech is slow, mildly hypophonic.  Chest: Clear to auscultation without wheezing, rhonchi or crackles noted.  Heart: S1+S2+0, regular and bradycardic.    Abdomen: Soft, non-tender and non-distended.  Extremities: There is 1+ pitting edema in the distal lower extremities bilaterally.   Skin: Warm and dry without trophic changes noted.   Musculoskeletal: exam reveals no obvious joint deformities.   Neurologically:  Mental status: The patient is awake, pays attention but unable to provide a detailed history.  Speech is slow.  Cooperative with exam.  Affect blunted.   Cranial nerves II - XII are as described above under HEENT exam.  Motor exam: thin bulk, global strength of about 4 out of 5, diminished reflexes throughout.  Mildly impaired fine motor skills, mild slowness in her movements.  No obvious resting tremor.   Fine motor skills and coordination: grossly mildly impaired.    Cerebellar testing: No dysmetria or intention tremor. There is no truncal or gait ataxia.  More sophisticated testing not possible. Sensory exam: intact to light touch in the upper and lower extremities.  Gait, station and balance: In wheelchair, I did not have her stand or walk for me today.   Assessment and plan:  In summary, SARAYA DESA is a very pleasant 77 y.o.-year old female with an underlying complex medical history of anemia, history of meningioma, cellulitis, chronic back pain, history of DVT, on Eliquis, reflux disease, gout, hypothyroidism, hyperlipidemia, irritable bowel syndrome, anxiety, depression, and recurrent headaches, who presents for follow-up consultation of her recurrent headaches.  Headache description currently is not classic for migraines, she may have had a migrainous component.  These may have improved with Aimovig but unclear currently.  She is on multiple medications including several psychotropic and potentially sedating medications.  Options for headache management are  limited.  They are advised to consider a referral to a comprehensive headache wellness clinic or academic headache center.  I am really worried about her polypharmacy.  Her primary care is encouraged to consider a referral to an academic headache center.  I advised patient to increase her water intake to about 3-4 bottles of water per day, limit caffeine to up to 1 or 2 servings of 8 ounce size each day, avoid caffeine after lunch.  We will plan a follow-up in this clinic in about 6 to 8 months, I did not suggest adding any new medications at this time.  I answered all the questions today and the patient and her nursing home staff Stewart were in agreement.   I spent 45 minutes in total face-to-face time and in reviewing records during pre-charting, more than 50% of which was spent in counseling and coordination of care, reviewing test results, reviewing medications and treatment regimen and/or in discussing or reviewing the diagnosis of recurrent headaches, the prognosis and treatment options. Pertinent laboratory and imaging test results that were available during this visit with the patient were reviewed by me and considered in my medical decision making (see chart for details).

## 2023-02-13 NOTE — Patient Instructions (Signed)
I recommend that you talk to your primary care provider about a referral to a comprehensive headache wellness center or academic headache clinic.  You are on multiple medications that can affect your cognitive sharpness.  I would like for you to reduce your caffeine intake as excessive caffeine can perpetuate headaches.  You were at risk for dehydration as you do not drink a whole lot of water.  I recommend that you increase your water intake to about 3-4 bottles of water per day.

## 2023-05-08 ENCOUNTER — Inpatient Hospital Stay (HOSPITAL_COMMUNITY)
Admission: EM | Admit: 2023-05-08 | Discharge: 2023-05-13 | DRG: 643 | Disposition: A | Discharge status: Other Acute Inpatient Hospital | Payer: Medicare (Managed Care) | Source: Skilled Nursing Facility | Attending: Family Medicine | Admitting: Family Medicine

## 2023-05-08 ENCOUNTER — Encounter (HOSPITAL_COMMUNITY): Payer: Self-pay

## 2023-05-08 ENCOUNTER — Other Ambulatory Visit: Payer: Self-pay

## 2023-05-08 DIAGNOSIS — E876 Hypokalemia: Secondary | ICD-10-CM | POA: Diagnosis present

## 2023-05-08 DIAGNOSIS — R64 Cachexia: Secondary | ICD-10-CM | POA: Diagnosis present

## 2023-05-08 DIAGNOSIS — D6869 Other thrombophilia: Secondary | ICD-10-CM | POA: Diagnosis not present

## 2023-05-08 DIAGNOSIS — Z86718 Personal history of other venous thrombosis and embolism: Secondary | ICD-10-CM

## 2023-05-08 DIAGNOSIS — K3189 Other diseases of stomach and duodenum: Secondary | ICD-10-CM | POA: Diagnosis not present

## 2023-05-08 DIAGNOSIS — K219 Gastro-esophageal reflux disease without esophagitis: Secondary | ICD-10-CM | POA: Diagnosis present

## 2023-05-08 DIAGNOSIS — Z79899 Other long term (current) drug therapy: Secondary | ICD-10-CM

## 2023-05-08 DIAGNOSIS — K644 Residual hemorrhoidal skin tags: Secondary | ICD-10-CM | POA: Diagnosis not present

## 2023-05-08 DIAGNOSIS — Z7982 Long term (current) use of aspirin: Secondary | ICD-10-CM | POA: Diagnosis not present

## 2023-05-08 DIAGNOSIS — D6859 Other primary thrombophilia: Secondary | ICD-10-CM | POA: Diagnosis present

## 2023-05-08 DIAGNOSIS — F0393 Unspecified dementia, unspecified severity, with mood disturbance: Secondary | ICD-10-CM | POA: Diagnosis present

## 2023-05-08 DIAGNOSIS — D62 Acute posthemorrhagic anemia: Secondary | ICD-10-CM | POA: Diagnosis present

## 2023-05-08 DIAGNOSIS — F32A Depression, unspecified: Secondary | ICD-10-CM | POA: Diagnosis present

## 2023-05-08 DIAGNOSIS — Z79890 Hormone replacement therapy (postmenopausal): Secondary | ICD-10-CM | POA: Diagnosis not present

## 2023-05-08 DIAGNOSIS — K2990 Gastroduodenitis, unspecified, without bleeding: Secondary | ICD-10-CM | POA: Diagnosis present

## 2023-05-08 DIAGNOSIS — Z6821 Body mass index (BMI) 21.0-21.9, adult: Secondary | ICD-10-CM | POA: Diagnosis not present

## 2023-05-08 DIAGNOSIS — M810 Age-related osteoporosis without current pathological fracture: Secondary | ICD-10-CM | POA: Diagnosis present

## 2023-05-08 DIAGNOSIS — K921 Melena: Secondary | ICD-10-CM | POA: Diagnosis not present

## 2023-05-08 DIAGNOSIS — D649 Anemia, unspecified: Secondary | ICD-10-CM | POA: Diagnosis not present

## 2023-05-08 DIAGNOSIS — K648 Other hemorrhoids: Secondary | ICD-10-CM | POA: Diagnosis present

## 2023-05-08 DIAGNOSIS — F29 Unspecified psychosis not due to a substance or known physiological condition: Secondary | ICD-10-CM | POA: Diagnosis present

## 2023-05-08 DIAGNOSIS — R413 Other amnesia: Secondary | ICD-10-CM | POA: Diagnosis not present

## 2023-05-08 DIAGNOSIS — R1084 Generalized abdominal pain: Secondary | ICD-10-CM | POA: Diagnosis present

## 2023-05-08 DIAGNOSIS — K254 Chronic or unspecified gastric ulcer with hemorrhage: Secondary | ICD-10-CM | POA: Diagnosis present

## 2023-05-08 DIAGNOSIS — K2950 Unspecified chronic gastritis without bleeding: Secondary | ICD-10-CM | POA: Diagnosis not present

## 2023-05-08 DIAGNOSIS — D125 Benign neoplasm of sigmoid colon: Secondary | ICD-10-CM | POA: Diagnosis present

## 2023-05-08 DIAGNOSIS — R1319 Other dysphagia: Secondary | ICD-10-CM | POA: Diagnosis not present

## 2023-05-08 DIAGNOSIS — E039 Hypothyroidism, unspecified: Secondary | ICD-10-CM | POA: Diagnosis present

## 2023-05-08 DIAGNOSIS — K5710 Diverticulosis of small intestine without perforation or abscess without bleeding: Secondary | ICD-10-CM | POA: Diagnosis not present

## 2023-05-08 DIAGNOSIS — R1310 Dysphagia, unspecified: Secondary | ICD-10-CM

## 2023-05-08 DIAGNOSIS — F418 Other specified anxiety disorders: Secondary | ICD-10-CM | POA: Diagnosis not present

## 2023-05-08 DIAGNOSIS — L89324 Pressure ulcer of left buttock, stage 4: Secondary | ICD-10-CM | POA: Diagnosis present

## 2023-05-08 DIAGNOSIS — K589 Irritable bowel syndrome without diarrhea: Secondary | ICD-10-CM | POA: Diagnosis present

## 2023-05-08 DIAGNOSIS — K922 Gastrointestinal hemorrhage, unspecified: Principal | ICD-10-CM | POA: Diagnosis present

## 2023-05-08 DIAGNOSIS — D122 Benign neoplasm of ascending colon: Secondary | ICD-10-CM | POA: Diagnosis present

## 2023-05-08 DIAGNOSIS — R195 Other fecal abnormalities: Secondary | ICD-10-CM | POA: Diagnosis not present

## 2023-05-08 DIAGNOSIS — R1013 Epigastric pain: Secondary | ICD-10-CM | POA: Diagnosis not present

## 2023-05-08 DIAGNOSIS — E870 Hyperosmolality and hypernatremia: Secondary | ICD-10-CM | POA: Diagnosis not present

## 2023-05-08 DIAGNOSIS — E785 Hyperlipidemia, unspecified: Secondary | ICD-10-CM | POA: Diagnosis present

## 2023-05-08 DIAGNOSIS — K449 Diaphragmatic hernia without obstruction or gangrene: Secondary | ICD-10-CM | POA: Diagnosis present

## 2023-05-08 DIAGNOSIS — Z888 Allergy status to other drugs, medicaments and biological substances status: Secondary | ICD-10-CM

## 2023-05-08 DIAGNOSIS — K5730 Diverticulosis of large intestine without perforation or abscess without bleeding: Secondary | ICD-10-CM | POA: Diagnosis not present

## 2023-05-08 DIAGNOSIS — Z7901 Long term (current) use of anticoagulants: Secondary | ICD-10-CM

## 2023-05-08 DIAGNOSIS — G8929 Other chronic pain: Secondary | ICD-10-CM | POA: Diagnosis present

## 2023-05-08 DIAGNOSIS — Z96641 Presence of right artificial hip joint: Secondary | ICD-10-CM | POA: Diagnosis present

## 2023-05-08 DIAGNOSIS — Z96649 Presence of unspecified artificial hip joint: Secondary | ICD-10-CM

## 2023-05-08 DIAGNOSIS — D500 Iron deficiency anemia secondary to blood loss (chronic): Secondary | ICD-10-CM | POA: Diagnosis present

## 2023-05-08 DIAGNOSIS — K2970 Gastritis, unspecified, without bleeding: Secondary | ICD-10-CM | POA: Diagnosis present

## 2023-05-08 LAB — CBC WITH DIFFERENTIAL/PLATELET
Abs Immature Granulocytes: 0.01 10*3/uL (ref 0.00–0.07)
Basophils Absolute: 0 10*3/uL (ref 0.0–0.1)
Basophils Relative: 1 %
Eosinophils Absolute: 0.2 10*3/uL (ref 0.0–0.5)
Eosinophils Relative: 4 %
HCT: 20.4 % — ABNORMAL LOW (ref 36.0–46.0)
Hemoglobin: 5.7 g/dL — CL (ref 12.0–15.0)
Immature Granulocytes: 0 %
Lymphocytes Relative: 26 %
Lymphs Abs: 1 10*3/uL (ref 0.7–4.0)
MCH: 25.9 pg — ABNORMAL LOW (ref 26.0–34.0)
MCHC: 27.9 g/dL — ABNORMAL LOW (ref 30.0–36.0)
MCV: 92.7 fL (ref 80.0–100.0)
Monocytes Absolute: 0.3 10*3/uL (ref 0.1–1.0)
Monocytes Relative: 9 %
Neutro Abs: 2.3 10*3/uL (ref 1.7–7.7)
Neutrophils Relative %: 60 %
Platelets: 387 10*3/uL (ref 150–400)
RBC: 2.2 MIL/uL — ABNORMAL LOW (ref 3.87–5.11)
RDW: 18.8 % — ABNORMAL HIGH (ref 11.5–15.5)
WBC: 3.9 10*3/uL — ABNORMAL LOW (ref 4.0–10.5)
nRBC: 0 % (ref 0.0–0.2)

## 2023-05-08 LAB — PREPARE RBC (CROSSMATCH)

## 2023-05-08 LAB — IRON AND TIBC
Iron: 12 ug/dL — ABNORMAL LOW (ref 28–170)
Saturation Ratios: 4 % — ABNORMAL LOW (ref 10.4–31.8)
TIBC: 322 ug/dL (ref 250–450)
UIBC: 310 ug/dL

## 2023-05-08 LAB — COMPREHENSIVE METABOLIC PANEL
ALT: 9 U/L (ref 0–44)
AST: 13 U/L — ABNORMAL LOW (ref 15–41)
Albumin: 3 g/dL — ABNORMAL LOW (ref 3.5–5.0)
Alkaline Phosphatase: 82 U/L (ref 38–126)
Anion gap: 7 (ref 5–15)
BUN: 9 mg/dL (ref 8–23)
CO2: 26 mmol/L (ref 22–32)
Calcium: 8.3 mg/dL — ABNORMAL LOW (ref 8.9–10.3)
Chloride: 108 mmol/L (ref 98–111)
Creatinine, Ser: 0.49 mg/dL (ref 0.44–1.00)
GFR, Estimated: >60 mL/min (ref >60)
Glucose, Bld: 100 mg/dL — ABNORMAL HIGH (ref 70–99)
Potassium: 3.2 mmol/L — ABNORMAL LOW (ref 3.5–5.1)
Sodium: 141 mmol/L (ref 135–145)
Total Bilirubin: 0.4 mg/dL (ref 0.0–1.2)
Total Protein: 6.1 g/dL — ABNORMAL LOW (ref 6.5–8.1)

## 2023-05-08 LAB — RETICULOCYTES
Immature Retic Fract: 3.4 % (ref 2.3–15.9)
RBC.: 2.15 MIL/uL — ABNORMAL LOW (ref 3.87–5.11)
Retic Count, Absolute: 81 10*3/uL (ref 19.0–186.0)
Retic Ct Pct: 3.9 % — ABNORMAL HIGH (ref 0.4–3.1)

## 2023-05-08 LAB — VITAMIN B12: Vitamin B-12: 383 pg/mL (ref 180–914)

## 2023-05-08 LAB — POC OCCULT BLOOD, ED: Occult Blood, Stool #1: POSITIVE

## 2023-05-08 LAB — LIPASE, BLOOD: Lipase: 29 U/L (ref 11–51)

## 2023-05-08 LAB — FERRITIN: Ferritin: 15 ng/mL (ref 11–307)

## 2023-05-08 LAB — FOLATE: Folate: 10.8 ng/mL (ref >5.9)

## 2023-05-08 MED ORDER — OLANZAPINE 5 MG PO TABS
10.0000 mg | ORAL_TABLET | Freq: Every day | ORAL | Status: DC
  Administered 2023-05-08 – 2023-05-12 (×5): 10 mg via ORAL
  Filled 2023-05-08 (×5): qty 2

## 2023-05-08 MED ORDER — LEVOTHYROXINE SODIUM 50 MCG PO TABS
50.0000 ug | ORAL_TABLET | Freq: Every day | ORAL | Status: DC
Start: 2023-05-09 — End: 2023-05-13
  Administered 2023-05-09 – 2023-05-13 (×5): 50 ug via ORAL
  Filled 2023-05-08 (×5): qty 1

## 2023-05-08 MED ORDER — VITAMIN C 500 MG PO TABS
500.0000 mg | ORAL_TABLET | Freq: Every day | ORAL | Status: DC
  Administered 2023-05-09 – 2023-05-13 (×5): 500 mg via ORAL
  Filled 2023-05-08 (×5): qty 1

## 2023-05-08 MED ORDER — DICYCLOMINE HCL 10 MG PO CAPS
20.0000 mg | ORAL_CAPSULE | Freq: Four times a day (QID) | ORAL | Status: DC
  Administered 2023-05-08 – 2023-05-13 (×17): 20 mg via ORAL
  Filled 2023-05-08 (×18): qty 2

## 2023-05-08 MED ORDER — BISACODYL 5 MG PO TBEC: 5.0000 mg | DELAYED_RELEASE_TABLET | Freq: Every day | ORAL | Status: DC | PRN

## 2023-05-08 MED ORDER — LORAZEPAM 1 MG PO TABS: 1.0000 mg | ORAL_TABLET | Freq: Two times a day (BID) | ORAL | Status: DC | PRN

## 2023-05-08 MED ORDER — ACETAMINOPHEN 325 MG PO TABS
650.0000 mg | ORAL_TABLET | Freq: Four times a day (QID) | ORAL | Status: DC | PRN
  Administered 2023-05-08 – 2023-05-13 (×6): 650 mg via ORAL
  Filled 2023-05-08 (×5): qty 2

## 2023-05-08 MED ORDER — SODIUM CHLORIDE 0.9% IV SOLUTION: Freq: Once | INTRAVENOUS | Status: AC

## 2023-05-08 MED ORDER — POTASSIUM CHLORIDE 2 MEQ/ML IV SOLN
INTRAVENOUS | Status: AC
  Filled 2023-05-08: qty 1000

## 2023-05-08 MED ORDER — FENTANYL CITRATE PF 50 MCG/ML IJ SOSY: 12.5000 ug | PREFILLED_SYRINGE | INTRAMUSCULAR | Status: DC | PRN

## 2023-05-08 MED ORDER — ATORVASTATIN CALCIUM 10 MG PO TABS
10.0000 mg | ORAL_TABLET | Freq: Every day | ORAL | Status: DC
  Administered 2023-05-08 – 2023-05-12 (×5): 10 mg via ORAL
  Filled 2023-05-08 (×5): qty 1

## 2023-05-08 MED ORDER — MEMANTINE HCL 10 MG PO TABS
10.0000 mg | ORAL_TABLET | Freq: Two times a day (BID) | ORAL | Status: DC
  Administered 2023-05-08 – 2023-05-13 (×10): 10 mg via ORAL
  Filled 2023-05-08 (×10): qty 1

## 2023-05-08 MED ORDER — PANTOPRAZOLE SODIUM 40 MG IV SOLR: 40.0000 mg | Freq: Once | INTRAVENOUS | Status: DC

## 2023-05-08 MED ORDER — FERROUS SULFATE 325 (65 FE) MG PO TABS
325.0000 mg | ORAL_TABLET | Freq: Every day | ORAL | Status: DC
  Administered 2023-05-09 – 2023-05-11 (×3): 325 mg via ORAL
  Filled 2023-05-08 (×3): qty 1

## 2023-05-08 MED ORDER — VITAMIN D 25 MCG (1000 UNIT) PO TABS
1000.0000 [IU] | ORAL_TABLET | Freq: Every day | ORAL | Status: DC
  Administered 2023-05-09 – 2023-05-13 (×5): 1000 [IU] via ORAL
  Filled 2023-05-08 (×5): qty 1

## 2023-05-08 MED ORDER — PANTOPRAZOLE SODIUM 40 MG IV SOLR
40.0000 mg | Freq: Two times a day (BID) | INTRAVENOUS | Status: DC
  Administered 2023-05-08 – 2023-05-11 (×7): 40 mg via INTRAVENOUS
  Filled 2023-05-08 (×7): qty 10

## 2023-05-08 MED ORDER — ACETAMINOPHEN 650 MG RE SUPP: 650.0000 mg | Freq: Four times a day (QID) | RECTAL | Status: DC | PRN

## 2023-05-08 MED ORDER — ONDANSETRON HCL 4 MG/2ML IJ SOLN: 4.0000 mg | Freq: Four times a day (QID) | INTRAMUSCULAR | Status: DC | PRN

## 2023-05-08 MED ORDER — VITAMIN B-12 1000 MCG PO TABS
1000.0000 ug | ORAL_TABLET | Freq: Every day | ORAL | Status: DC
  Administered 2023-05-09 – 2023-05-13 (×5): 1000 ug via ORAL
  Filled 2023-05-08 (×5): qty 1

## 2023-05-08 MED ORDER — ONDANSETRON HCL 4 MG PO TABS: 4.0000 mg | ORAL_TABLET | Freq: Four times a day (QID) | ORAL | Status: DC | PRN

## 2023-05-08 MED ORDER — OXYCODONE HCL 5 MG PO TABS
5.0000 mg | ORAL_TABLET | Freq: Four times a day (QID) | ORAL | Status: DC | PRN
  Administered 2023-05-09 – 2023-05-12 (×4): 5 mg via ORAL
  Filled 2023-05-08 (×4): qty 1

## 2023-05-08 MED ORDER — VENLAFAXINE HCL ER 75 MG PO CP24
75.0000 mg | ORAL_CAPSULE | Freq: Every day | ORAL | Status: DC
  Administered 2023-05-09 – 2023-05-13 (×4): 75 mg via ORAL
  Filled 2023-05-08 (×5): qty 1

## 2023-05-08 MED ORDER — CALCITONIN (SALMON) 200 UNIT/ACT NA SOLN
1.0000 | Freq: Every day | NASAL | Status: DC
  Administered 2023-05-09 – 2023-05-13 (×5): 1 via NASAL
  Filled 2023-05-08: qty 3.7

## 2023-05-08 NOTE — ED Provider Notes (Addendum)
[place] EMERGENCY DEPARTMENT AT [facility] Provider Note   CSN: [id number] Arrival date & time: 05/08/23  4696     History  Chief Complaint  Patient presents with   Weakness   abnormal labs    [name] is a 78 y.o. 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  Patient states that she has had melanotic stools for the past few days and has been taking her Eliquis.  Patient denies history of GI bleeds.  Patient denies any chest pain shortness of breath or any hemoptysis but does states she is epigastric abdominal tenderness.  Patient denies fevers.  Patient reportedly has hemoglobin count of 6 and was referred to ED.  Home Medications Prior to Admission medications   Medication Sig Start Date End Date Taking? Authorizing Provider  acetaminophen (TYLENOL) 325 MG tablet Take 650 mg by mouth every 6 (six) hours as needed for fever.     [provider]  aspirin EC 81 MG tablet Take 81 mg by mouth daily.    [provider]  atorvastatin (LIPITOR) 10 MG tablet Take 10 mg by mouth at bedtime.     [provider]  Cholecalciferol (VITAMIN D3) 50000 units TABS Take by mouth every 14 (fourteen) days.    [provider]  citalopram (CELEXA) 20 MG tablet Take 20 mg by mouth daily.    [provider]  dicyclomine (BENTYL) 20 MG tablet Take 20 mg by mouth 4 (four) times daily. 06/19/21   [provider]  Erenumab-aooe (AIMOVIG) 140 MG/ML SOAJ Inject 140 mg into the skin every 30 (thirty) days. 11/16/21   Ocie Doyne, MD  ferrous sulfate 325 (65 FE) MG tablet Take 325 mg by mouth 2 (two) times daily with a meal.     [provider]  ketoconazole (NIZORAL) 2 % cream Apply 1 Application topically daily. 10/18/21   Vivi Barrack, DPM  levothyroxine (SYNTHROID, LEVOTHROID) 50 MCG tablet Take 50 mcg by mouth daily before breakfast.     [provider]  LORazepam (ATIVAN) 0.5 MG tablet Take 0.5 mg by mouth 2 (two) times daily.    [provider]  memantine (NAMENDA) 10 MG tablet Take 10 mg by mouth 2 (two) times daily.    [provider]  nystatin (MYCOSTATIN/NYSTOP) powder Apply topically. 12/04/22   [provider]  OLANZapine (ZYPREXA) 5 MG tablet Take 5 mg by mouth at bedtime. 06/08/21   [provider]  omeprazole (PRILOSEC) 20 MG capsule Take 20 mg by mouth daily.    [provider]  pregabalin (LYRICA) 50 MG capsule Take 50 mg in AM, 50 mg in the afternoon, and 75 mg in PM. If no side effects after one week can increase to 75 mg in the morning, 50 mg in the afternoon, and 75 mg in PM 03/16/21   Chima, Victorino Dike, MD  pregabalin (LYRICA) 50 MG capsule Take 50 mg by mouth daily. Patient not taking: Reported on 02/13/2023    [provider]  Probiotic CAPS Take 1 capsule by mouth daily.    [provider]  QUEtiapine (SEROQUEL) 50 MG tablet Take 50 mg by mouth at bedtime.    [provider]  senna-docusate (SENOKOT-S) 8.6-50 MG tablet Take 2 tablets by mouth 2 (two) times daily.    [provider]  traMADol (ULTRAM) 50 MG tablet Take by mouth every 6 (six) hours as needed.  [provider]  traZODone (DESYREL) 25 mg TABS tablet Take 25 mg by mouth at bedtime.    [provider]  vitamin C (ASCORBIC ACID) 500 MG tablet Take 500 mg by mouth 2 (two) times daily.     [provider]      Allergies    Diphenhydramine and Tape    Review of Systems   Review of Systems  Neurological:  Positive for weakness.    Physical Exam Updated Vital Signs BP (!) 111/58   Pulse 78   Temp 98.8 F (37.1 C) (Oral)   Resp 18   Ht 5\' 7"  (1.702 m)   Wt 63.5 kg   SpO2 95%   BMI 21.93 kg/m  Physical Exam Constitutional:      General: She is not in acute distress. Cardiovascular:     Rate and Rhythm: Normal rate and regular  rhythm.     Pulses: Normal pulses.     Heart sounds: Normal heart sounds.  Pulmonary:     Effort: Pulmonary effort is normal. No respiratory distress.     Breath sounds: Normal breath sounds.  Abdominal:     Palpations: Abdomen is soft.     Tenderness: There is abdominal tenderness (Epigastrium). There is no guarding or rebound.  Genitourinary:    Comments: Chaperone: Sadie Haber, RN Obvious melena Skin:    Capillary Refill: Capillary refill takes 2 to 3 seconds.     Coloration: Skin is pale.  Neurological:     Mental Status: She is alert and oriented to person, place, and time.  Psychiatric:        Mood and Affect: Mood normal.     ED Results / Procedures / Treatments   Labs (all labs ordered are listed, but only abnormal results are displayed) Labs Reviewed  COMPREHENSIVE METABOLIC PANEL - Abnormal; Notable for the following components:      Result Value   Potassium 3.2 (*)    Glucose, Bld 100 (*)    Calcium 8.3 (*)    Total Protein 6.1 (*)    Albumin 3.0 (*)    AST 13 (*)    All other components within normal limits  POC OCCULT BLOOD, ED - Abnormal  LIPASE, BLOOD  CBC WITH DIFFERENTIAL/PLATELET  URINALYSIS, ROUTINE W REFLEX MICROSCOPIC  TYPE AND SCREEN    EKG None  Radiology No results found.  Procedures .Critical Care  Performed by: Netta Corrigan, PA-C Authorized by: Netta Corrigan, PA-C   Critical care provider statement:    Critical care time (minutes):  40   Critical care time was exclusive of:  Separately billable procedures and treating other patients   Critical care was necessary to treat or prevent imminent or life-threatening deterioration of the following conditions: Anemia requiring transfusion.   Critical care was time spent personally by me on the following activities:  Blood draw for specimens, development of treatment plan with patient or surrogate, discussions with consultants, evaluation of patient's response to treatment,  examination of patient, obtaining history from patient or surrogate, review of old charts, re-evaluation of patient's condition, pulse oximetry, ordering and review of radiographic studies, ordering and review of laboratory studies and ordering and performing treatments and interventions   I assumed direction of critical care for this patient from another provider in my specialty: no     Care discussed with: admitting provider       Medications Ordered in ED Medications  pantoprazole (PROTONIX) injection 40 mg (has no administration in  time range)    ED Course/ Medical Decision Making/ A&P                                 Medical Decision Making Amount and/or Complexity of Data Reviewed Labs: ordered.  Risk Prescription drug management. Decision regarding hospitalization.   Cheron Every 78 y.o. presented today for GIB. Working DDx that I considered at this time includes, but not limited to, Esophagitis, Mallory Weiss/Boerhaave, Variceal bleeding, PUD/gastritis/ulcers, diverticular bleed, colon cancer, rectal bleed, internal/external hemorrhoids  R/o DDx: Esophagitis, Mallory Weiss/Boerhaave, Variceal bleeding, diverticular bleed, colon cancer, rectal bleed, internal/external hemorrhoids: These are considered less likely due to history of present illness, physical exam, labs/imaging findings  Review of prior external notes: 02/13/2023 office visit  Unique Tests and My Independent Interpretation:  CBC: Hemoglobin 5.7 CMP: 3.2 K Lipase: Unremarkable Type and screen: O+ Fecal occult: Positive  Social Determinants of Health: none  Discussion with Independent Historian:  EMS  Discussion of Management of Tests:  Genella Mech, MD GI; Laural Benes, MD Hospitalist  Risk: High: hospitalization or escalation of hospital-level care  Risk Stratification Score: None  Plan: On exam patient was in no acute distress with stable vitals. Physical exam showed obvious melena on  exam with a chaperone in the room.  Patient does appear pale as well on exam with cap refill of 2 to 3 seconds.  At this time I do spoke patient is having upper GI bleed causing her weakness and pale appearance and so we will start Protonix and consult GI for admission.  Labs ordered along with type and screen.  I spoke to pharmacist and he states to wait on labs and GI recommendation to see if they want the Eliquis reversed.  Patient's hgb is reportedly 6 from an outside facility requiring transfusion Patient does not have any religious beliefs that would restrict him from receiving blood. I spoke to the patient about the need for a blood transfusion and the risks associated with this such as transfusion reaction, allergic reaction, incompatibility, hemolysis, circulatory overload, etc.  Patient verbalized understanding and acceptance of these risks. Conversation witnessed by [facility], RN Type and screen has already been ordered. Will transfuse once we have hemoglobin.  I spoke to GI and they state they will see the patient in consult and that patient go to the hospitalist.  Hospitalist consulted.  They state that they will wait out the Eliquis and do not feel needs to be reversed.  At this time we are still waiting on the hemoglobin to result.  I spoke to the hospitalist and patient was accepted for admission.  Patient stable for admission.  Patient's hemoglobin came back at 5.7.  Will transfuse 2 units.  Hospitalist made aware.  This chart was dictated using voice recognition software.  Despite best efforts to proofread,  errors can occur which can change the documentation meaning.        Final Clinical Impression(s) / ED Diagnoses Final diagnoses:  Upper GI bleed    Rx / DC Orders ED Discharge Orders     None         Remi Deter 05/08/23 1132    Netta Corrigan, PA-C 05/08/23 1149    Coral Spikes, DO 05/08/23 1455

## 2023-05-08 NOTE — Consult Note (Signed)
Gastroenterology Consult   Referring Provider: No ref. provider found Primary Care Physician:  Charlynne Pander, MD Primary Gastroenterologist:  previously Dr. Jena Gauss   Patient ID: [name]; [id number]; 20-Sep-1945   Admit date: 05/08/2023  LOS: 0 days   Date of Consultation: 05/08/2023  Reason for Consultation:  anemia, heme positive stool   History of Present Illness   [name] is a 78 y.o. year old female with history of DVTs on Eliquis, history of psychosis, hypothyroidism who presented to the ED for low hemoglobin. Patient reported melanotic stools for the past few days. Denies history of GI bleed in the past. Endorsed epigastric tenderness.   ED Course: FOBT positive Hgb 5.7 Potassium 3.2, calcium 8.3 Lipase 29  Iron studies, folate, b12 in process   Consult: Patient states she was told her blood counts were low which prompted her to come to the ER. She resides at [facility] currently. She does endorse feeling more tired recently. She has been having black stools but she cannot tell me when this started. Denies BRBPR. She endorses some sharp pain to general abdomen, unable to pinpoint specific location. Appetite is not good, stating she does not feel very hungry. She has diarrhea but reports this is baseline for her. She had some nausea and vomiting last week but these symptoms have resolved.   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  Last Colonoscopy: never per patient.   Past Medical History:  Diagnosis Date   Anemia    Anemia    Anxiety    Anxiety and depression    Bacteremia    Cellulitis    Chronic back pain    Chronic diarrhea    Chronic right hip pain    Complication of anesthesia    Depression    DVT (deep venous thrombosis) (HCC)    Dysphagia    GERD (gastroesophageal reflux disease)    Gout    Gout    Headache     Hyperlipidemia    Hypopotassemia    Hypothyroidism    IBS (irritable bowel syndrome)    Major depressive disorder    Muscle weakness (generalized)    Osteoporosis    Pneumonia    PONV (postoperative nausea and vomiting)    Pressure ulcer    Pressure ulcer of buttock    Psychosis (HCC)    Thyroid disease    hypothyroidism    Past Surgical History:  Procedure Laterality Date   COLONOSCOPY  2007   Dr. Benard Rink: Adenomatous colon polyp   ESOPHAGOGASTRODUODENOSCOPY N/A 07/09/2016   Procedure: ESOPHAGOGASTRODUODENOSCOPY (EGD);  Surgeon: Corbin Ade, MD;  [facility];  Service: Endoscopy;  Laterality: N/A;   ESOPHAGOGASTRODUODENOSCOPY (EGD) WITH PROPOFOL N/A 04/18/2017   Procedure: ESOPHAGOGASTRODUODENOSCOPY (EGD) WITH PROPOFOL;  Surgeon: Corbin Ade, MD;  Location: [facility];  Service: Endoscopy;  Laterality: N/A;  10:30am   HIP ARTHROPLASTY  05/01/2011   Procedure: right ARTHROPLASTY BIPOLAR HIP;  Surgeon: Fuller Canada, MD;  Location: [facility];  Service: Orthopedics;  Laterality: Right;   INCISION AND DRAINAGE HIP  02/08/2012   Procedure: IRRIGATION AND DEBRIDEMENT HIP;  Surgeon: Vickki Hearing, MD;  Location: [facility];  Service: Orthopedics;  Laterality: Right;   INCISION AND DRAINAGE OF WOUND  05/24/2011   Procedure: IRRIGATION AND DEBRIDEMENT WOUND;  Surgeon: Fuller Canada, MD;  Location: [facility];  Service: Orthopedics;  Laterality: Right;   MALONEY DILATION N/A 04/18/2017   Procedure: Elease Hashimoto DILATION;  Surgeon: Corbin Ade, MD;  Location: [facility];  Service: Endoscopy;  Laterality: N/A;    Prior to Admission medications   Medication Sig Start Date End Date Taking? Authorizing Provider  acetaminophen (TYLENOL) 325 MG tablet Take 650 mg by mouth Stewart 6 (six) hours as needed for fever.     [provider]  aspirin EC 81 MG tablet Take 81 mg by mouth daily.    [provider]  atorvastatin (LIPITOR) 10 MG tablet Take 10 mg by mouth at  bedtime.     [provider]  Cholecalciferol (VITAMIN D3) 50000 units TABS Take by mouth Stewart 14 (fourteen) days.    [provider]  citalopram (CELEXA) 20 MG tablet Take 20 mg by mouth daily.    [provider]  dicyclomine (BENTYL) 20 MG tablet Take 20 mg by mouth 4 (four) times daily. 06/19/21   [provider]  Erenumab-aooe (AIMOVIG) 140 MG/ML SOAJ Inject 140 mg into the skin Stewart 30 (thirty) days. 11/16/21   Ocie Doyne, MD  ferrous sulfate 325 (65 FE) MG tablet Take 325 mg by mouth 2 (two) times daily with a meal.     [provider]  ketoconazole (NIZORAL) 2 % cream Apply 1 Application topically daily. 10/18/21   Vivi Barrack, DPM  levothyroxine (SYNTHROID, LEVOTHROID) 50 MCG tablet Take 50 mcg by mouth daily before breakfast.     [provider]  LORazepam (ATIVAN) 0.5 MG tablet Take 0.5 mg by mouth 2 (two) times daily.    [provider]  memantine (NAMENDA) 10 MG tablet Take 10 mg by mouth 2 (two) times daily.    [provider]  nystatin (MYCOSTATIN/NYSTOP) powder Apply topically. 12/04/22   [provider]  OLANZapine (ZYPREXA) 5 MG tablet Take 5 mg by mouth at bedtime. 06/08/21   [provider]  omeprazole (PRILOSEC) 20 MG capsule Take 20 mg by mouth daily.    [provider]  pregabalin (LYRICA) 50 MG capsule Take 50 mg in AM, 50 mg in the afternoon, and 75 mg in PM. If no side effects after one week can increase to 75 mg in the morning, 50 mg in the afternoon, and 75 mg in PM 03/16/21   Chima, Victorino Dike, MD  pregabalin (LYRICA) 50 MG capsule Take 50 mg by mouth daily. Patient not taking: Reported on 02/13/2023    [provider]  Probiotic CAPS Take 1 capsule by mouth daily.    [provider]  QUEtiapine (SEROQUEL) 50 MG tablet Take 50 mg by mouth at bedtime.    [provider]  senna-docusate (SENOKOT-S) 8.6-50 MG tablet Take 2 tablets by mouth 2  (two) times daily.    [provider]  traMADol (ULTRAM) 50 MG tablet Take by mouth Stewart 6 (six) hours as needed.    [provider]  traZODone (DESYREL) 25 mg TABS tablet Take 25 mg by mouth at bedtime.    [provider]  vitamin C (ASCORBIC ACID) 500 MG tablet Take 500 mg by mouth 2 (two) times daily.     [provider]    Current Facility-Administered Medications  Medication Dose Route Frequency Provider Last Rate Last Admin   pantoprazole (PROTONIX) injection 40 mg  40 mg Intravenous Once Schuman, James T, PA-C       Current Outpatient Medications  Medication Sig Dispense Refill   acetaminophen (TYLENOL) 325  MG tablet Take 650 mg by mouth Stewart 6 (six) hours as needed for fever.      aspirin EC 81 MG tablet Take 81 mg by mouth daily.     atorvastatin (LIPITOR) 10 MG tablet Take 10 mg by mouth at bedtime.      Cholecalciferol (VITAMIN D3) 50000 units TABS Take by mouth Stewart 14 (fourteen) days.     citalopram (CELEXA) 20 MG tablet Take 20 mg by mouth daily.     dicyclomine (BENTYL) 20 MG tablet Take 20 mg by mouth 4 (four) times daily.     Erenumab-aooe (AIMOVIG) 140 MG/ML SOAJ Inject 140 mg into the skin Stewart 30 (thirty) days. 1.12 mL 3   ferrous sulfate 325 (65 FE) MG tablet Take 325 mg by mouth 2 (two) times daily with a meal.      ketoconazole (NIZORAL) 2 % cream Apply 1 Application topically daily. 60 g 0   levothyroxine (SYNTHROID, LEVOTHROID) 50 MCG tablet Take 50 mcg by mouth daily before breakfast.      LORazepam (ATIVAN) 0.5 MG tablet Take 0.5 mg by mouth 2 (two) times daily.     memantine (NAMENDA) 10 MG tablet Take 10 mg by mouth 2 (two) times daily.     nystatin (MYCOSTATIN/NYSTOP) powder Apply topically.     OLANZapine (ZYPREXA) 5 MG tablet Take 5 mg by mouth at bedtime.     omeprazole (PRILOSEC) 20 MG capsule Take 20 mg by mouth daily.     pregabalin (LYRICA) 50 MG capsule Take 50 mg in AM, 50 mg in the afternoon, and 75 mg in PM.  If no side effects after one week can increase to 75 mg in the morning, 50 mg in the afternoon, and 75 mg in PM 180 capsule 2   pregabalin (LYRICA) 50 MG capsule Take 50 mg by mouth daily. (Patient not taking: Reported on 02/13/2023)     Probiotic CAPS Take 1 capsule by mouth daily.     QUEtiapine (SEROQUEL) 50 MG tablet Take 50 mg by mouth at bedtime.     senna-docusate (SENOKOT-S) 8.6-50 MG tablet Take 2 tablets by mouth 2 (two) times daily.     traMADol (ULTRAM) 50 MG tablet Take by mouth Stewart 6 (six) hours as needed.     traZODone (DESYREL) 25 mg TABS tablet Take 25 mg by mouth at bedtime.     vitamin C (ASCORBIC ACID) 500 MG tablet Take 500 mg by mouth 2 (two) times daily.       Allergies as of 05/08/2023 - Review Complete 05/08/2023  Allergen Reaction Noted   Diphenhydramine Other (See Comments) 04/30/2011   Tape Dermatitis 11/20/2013    Family History  Problem Relation Age of Onset   Migraines Mother     Social History   Socioeconomic History   Marital status: Single    Spouse name: Not on file   Number of children: Not on file   Years of education: Not on file   Highest education level: Not on file  Occupational History   Not on file  Tobacco Use   Smoking status: Never   Smokeless tobacco: Never  Vaping Use   Vaping status: Never Used  Substance and Sexual Activity   Alcohol use: No   Drug use: No   Sexual activity: Never  Other Topics Concern   Not on file  Social History Narrative   03/16/21 lives at [facility]   Patient's legal guardian is Mr. Zebedee Iba. Phone number  [phone].   Social Drivers of Corporate investment banker Strain: Not on file  Food Insecurity: Not on file  Transportation Needs: Not on file  Physical Activity: Not on file  Stress: Not on file  Social Connections: Not on file  Intimate Partner Violence: Not on file    Review of Systems   Gen: Denies any fever, chills, loss of appetite, change in weight or  weight loss CV: Denies chest pain, heart palpitations, syncope, edema  Resp: Denies shortness of breath with rest, cough, wheezing, coughing up blood, and pleurisy. GI: denies hematochezia, nausea, vomiting, diarrhea, constipation, dysphagia, odyonophagia, early satiety or weight loss. +melena +decreased appetite +abdominal pain  GU : Denies urinary burning, blood in urine, urinary frequency, and urinary incontinence. MS: Denies joint pain, limitation of movement, swelling, cramps, and atrophy.  Derm: Denies rash, itching, dry skin, hives. Psych: Denies depression, anxiety, memory loss, hallucinations, and confusion. Heme: Denies bruising or bleeding Neuro:  Denies any headaches, dizziness, paresthesias, shaking  Physical Exam   Vital Signs in last 24 hours: Temp:  [98.8 F (37.1 C)] 98.8 F (37.1 C) (02/12 1002) Pulse Rate:  [78] 78 (02/12 1002) Resp:  [18] 18 (02/12 1002) BP: (111)/(58) 111/58 (02/12 1002) SpO2:  [95 %] 95 % (02/12 1002) Weight:  [63.5 kg] 63.5 kg (02/12 1000)   General:   Alert,  Well-developed, well-nourished, pleasant and cooperative in NAD Head:  Normocephalic and atraumatic. Eyes:  Sclera clear, no icterus.   Conjunctiva pink. Ears:  Normal auditory acuity. Mouth:  No deformity or lesions, dentition normal. Lungs:  Clear throughout to auscultation.   No wheezes, crackles, or rhonchi. No acute distress. Heart:  Regular rate and rhythm; no murmurs, clicks, rubs,  or gallops. Abdomen:  Soft, nontender and nondistended. No masses, hepatosplenomegaly or hernias noted. Normal bowel sounds, without guarding, and without rebound.   Neurologic:  Alert and  oriented to person, place and situation, disoriented to time  Skin:  Intact without significant lesions or rashes. Psych:  Alert and cooperative. Normal mood and affect.  Labs/Studies   Recent Labs No results for input(s): "WBC", "HGB", "HCT", "PLT" in the last 72 hours. BMET Recent Labs    05/08/23 1017   NA 141  K 3.2*  CL 108  CO2 26  GLUCOSE 100*  BUN 9  CREATININE 0.49  CALCIUM 8.3*   LFT Recent Labs    05/08/23 1017  PROT 6.1*  ALBUMIN 3.0*  AST 13*  ALT 9  ALKPHOS 82  BILITOT 0.4    Assessment   [name] is a 78 y.o. year old female history of DVTs on Eliquis, history of psychosis, hypothyroidism who presented to the ED with anemia, melena and epigastric pain. GI consulted for further evaluation.    Anemia and heme positive stools:hgb 5.7 on arrival. Patient endorses melanotic stools as outpatient. She is chronically anticoagulated on eliquis with last dose was this morning at 8am per staff at patient's SNF. She also endorses decreased appetite and some generalized abdominal pain. Recommend proceeding with EGD once there has been time for eliquis washout as I cannot rule out PUD, gastritis, duodenitis, AVMs, malignancy. Indications, risks and benefits of procedure discussed in detail with patient. Patient verbalized understanding and is in agreement to proceed with EGD on Friday to give time for eliquis washout.    Plan / Recommendations   Continue to hold Eliquis PPI BID Trend h&h, transfuse for hgb <7 Monitor for overt GI bleeding  Plan for  EGD on Friday once eliquis has had time to washout  [name] L. Jeanmarie Hubert, MSN, APRN, AGNP-C Adult-Gerontology Nurse Practitioner [facility]

## 2023-05-08 NOTE — Hospital Course (Signed)
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  CBC tested at the facility was noted to be 6.4 and she was sent to the emergency department.  Repeat CBC with hemoglobin of 5.7.  Patient tested guaiac positive in the stool.  Patient was started on IV pantoprazole and PRBC transfusion ordered and admission requested for management.  GI consultation requested in the ED and they will consult.

## 2023-05-08 NOTE — H&P (View-Only) (Signed)
 Gastroenterology Consult   Referring Provider: No ref. provider found Primary Care Physician:  Charlynne Pander, MD Primary Gastroenterologist:  previously Dr. Jena Gauss   Patient ID: [name]; [id number]; 20-Sep-1945   Admit date: 05/08/2023  LOS: 0 days   Date of Consultation: 05/08/2023  Reason for Consultation:  anemia, heme positive stool   History of Present Illness   [name] is a 78 y.o. year old female with history of DVTs on Eliquis, history of psychosis, hypothyroidism who presented to the ED for low hemoglobin. Patient reported melanotic stools for the past few days. Denies history of GI bleed in the past. Endorsed epigastric tenderness.   ED Course: FOBT positive Hgb 5.7 Potassium 3.2, calcium 8.3 Lipase 29  Iron studies, folate, b12 in process   Consult: Patient states she was told her blood counts were low which prompted her to come to the ER. She resides at [facility] currently. She does endorse feeling more tired recently. She has been having black stools but she cannot tell me when this started. Denies BRBPR. She endorses some sharp pain to general abdomen, unable to pinpoint specific location. Appetite is not good, stating she does not feel very hungry. She has diarrhea but reports this is baseline for her. She had some nausea and vomiting last week but these symptoms have resolved.   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  Last Colonoscopy: never per patient.   Past Medical History:  Diagnosis Date   Anemia    Anemia    Anxiety    Anxiety and depression    Bacteremia    Cellulitis    Chronic back pain    Chronic diarrhea    Chronic right hip pain    Complication of anesthesia    Depression    DVT (deep venous thrombosis) (HCC)    Dysphagia    GERD (gastroesophageal reflux disease)    Gout    Gout    Headache     Hyperlipidemia    Hypopotassemia    Hypothyroidism    IBS (irritable bowel syndrome)    Major depressive disorder    Muscle weakness (generalized)    Osteoporosis    Pneumonia    PONV (postoperative nausea and vomiting)    Pressure ulcer    Pressure ulcer of buttock    Psychosis (HCC)    Thyroid disease    hypothyroidism    Past Surgical History:  Procedure Laterality Date   COLONOSCOPY  2007   Dr. Benard Rink: Adenomatous colon polyp   ESOPHAGOGASTRODUODENOSCOPY N/A 07/09/2016   Procedure: ESOPHAGOGASTRODUODENOSCOPY (EGD);  Surgeon: Corbin Ade, MD;  [facility];  Service: Endoscopy;  Laterality: N/A;   ESOPHAGOGASTRODUODENOSCOPY (EGD) WITH PROPOFOL N/A 04/18/2017   Procedure: ESOPHAGOGASTRODUODENOSCOPY (EGD) WITH PROPOFOL;  Surgeon: Corbin Ade, MD;  Location: [facility];  Service: Endoscopy;  Laterality: N/A;  10:30am   HIP ARTHROPLASTY  05/01/2011   Procedure: right ARTHROPLASTY BIPOLAR HIP;  Surgeon: Fuller Canada, MD;  Location: [facility];  Service: Orthopedics;  Laterality: Right;   INCISION AND DRAINAGE HIP  02/08/2012   Procedure: IRRIGATION AND DEBRIDEMENT HIP;  Surgeon: Vickki Hearing, MD;  Location: [facility];  Service: Orthopedics;  Laterality: Right;   INCISION AND DRAINAGE OF WOUND  05/24/2011   Procedure: IRRIGATION AND DEBRIDEMENT WOUND;  Surgeon: Fuller Canada, MD;  Location: [facility];  Service: Orthopedics;  Laterality: Right;   MALONEY DILATION N/A 04/18/2017   Procedure: Elease Hashimoto DILATION;  Surgeon: Corbin Ade, MD;  Location: [facility];  Service: Endoscopy;  Laterality: N/A;    Prior to Admission medications   Medication Sig Start Date End Date Taking? Authorizing Provider  acetaminophen (TYLENOL) 325 MG tablet Take 650 mg by mouth Stewart 6 (six) hours as needed for fever.     [provider]  aspirin EC 81 MG tablet Take 81 mg by mouth daily.    [provider]  atorvastatin (LIPITOR) 10 MG tablet Take 10 mg by mouth at  bedtime.     [provider]  Cholecalciferol (VITAMIN D3) 50000 units TABS Take by mouth Stewart 14 (fourteen) days.    [provider]  citalopram (CELEXA) 20 MG tablet Take 20 mg by mouth daily.    [provider]  dicyclomine (BENTYL) 20 MG tablet Take 20 mg by mouth 4 (four) times daily. 06/19/21   [provider]  Erenumab-aooe (AIMOVIG) 140 MG/ML SOAJ Inject 140 mg into the skin Stewart 30 (thirty) days. 11/16/21   Ocie Doyne, MD  ferrous sulfate 325 (65 FE) MG tablet Take 325 mg by mouth 2 (two) times daily with a meal.     [provider]  ketoconazole (NIZORAL) 2 % cream Apply 1 Application topically daily. 10/18/21   Vivi Barrack, DPM  levothyroxine (SYNTHROID, LEVOTHROID) 50 MCG tablet Take 50 mcg by mouth daily before breakfast.     [provider]  LORazepam (ATIVAN) 0.5 MG tablet Take 0.5 mg by mouth 2 (two) times daily.    [provider]  memantine (NAMENDA) 10 MG tablet Take 10 mg by mouth 2 (two) times daily.    [provider]  nystatin (MYCOSTATIN/NYSTOP) powder Apply topically. 12/04/22   [provider]  OLANZapine (ZYPREXA) 5 MG tablet Take 5 mg by mouth at bedtime. 06/08/21   [provider]  omeprazole (PRILOSEC) 20 MG capsule Take 20 mg by mouth daily.    [provider]  pregabalin (LYRICA) 50 MG capsule Take 50 mg in AM, 50 mg in the afternoon, and 75 mg in PM. If no side effects after one week can increase to 75 mg in the morning, 50 mg in the afternoon, and 75 mg in PM 03/16/21   Chima, Victorino Dike, MD  pregabalin (LYRICA) 50 MG capsule Take 50 mg by mouth daily. Patient not taking: Reported on 02/13/2023    [provider]  Probiotic CAPS Take 1 capsule by mouth daily.    [provider]  QUEtiapine (SEROQUEL) 50 MG tablet Take 50 mg by mouth at bedtime.    [provider]  senna-docusate (SENOKOT-S) 8.6-50 MG tablet Take 2 tablets by mouth 2  (two) times daily.    [provider]  traMADol (ULTRAM) 50 MG tablet Take by mouth Stewart 6 (six) hours as needed.    [provider]  traZODone (DESYREL) 25 mg TABS tablet Take 25 mg by mouth at bedtime.    [provider]  vitamin C (ASCORBIC ACID) 500 MG tablet Take 500 mg by mouth 2 (two) times daily.     [provider]    Current Facility-Administered Medications  Medication Dose Route Frequency Provider Last Rate Last Admin   pantoprazole (PROTONIX) injection 40 mg  40 mg Intravenous Once Schuman, James T, PA-C       Current Outpatient Medications  Medication Sig Dispense Refill   acetaminophen (TYLENOL) 325  MG tablet Take 650 mg by mouth Stewart 6 (six) hours as needed for fever.      aspirin EC 81 MG tablet Take 81 mg by mouth daily.     atorvastatin (LIPITOR) 10 MG tablet Take 10 mg by mouth at bedtime.      Cholecalciferol (VITAMIN D3) 50000 units TABS Take by mouth Stewart 14 (fourteen) days.     citalopram (CELEXA) 20 MG tablet Take 20 mg by mouth daily.     dicyclomine (BENTYL) 20 MG tablet Take 20 mg by mouth 4 (four) times daily.     Erenumab-aooe (AIMOVIG) 140 MG/ML SOAJ Inject 140 mg into the skin Stewart 30 (thirty) days. 1.12 mL 3   ferrous sulfate 325 (65 FE) MG tablet Take 325 mg by mouth 2 (two) times daily with a meal.      ketoconazole (NIZORAL) 2 % cream Apply 1 Application topically daily. 60 g 0   levothyroxine (SYNTHROID, LEVOTHROID) 50 MCG tablet Take 50 mcg by mouth daily before breakfast.      LORazepam (ATIVAN) 0.5 MG tablet Take 0.5 mg by mouth 2 (two) times daily.     memantine (NAMENDA) 10 MG tablet Take 10 mg by mouth 2 (two) times daily.     nystatin (MYCOSTATIN/NYSTOP) powder Apply topically.     OLANZapine (ZYPREXA) 5 MG tablet Take 5 mg by mouth at bedtime.     omeprazole (PRILOSEC) 20 MG capsule Take 20 mg by mouth daily.     pregabalin (LYRICA) 50 MG capsule Take 50 mg in AM, 50 mg in the afternoon, and 75 mg in PM.  If no side effects after one week can increase to 75 mg in the morning, 50 mg in the afternoon, and 75 mg in PM 180 capsule 2   pregabalin (LYRICA) 50 MG capsule Take 50 mg by mouth daily. (Patient not taking: Reported on 02/13/2023)     Probiotic CAPS Take 1 capsule by mouth daily.     QUEtiapine (SEROQUEL) 50 MG tablet Take 50 mg by mouth at bedtime.     senna-docusate (SENOKOT-S) 8.6-50 MG tablet Take 2 tablets by mouth 2 (two) times daily.     traMADol (ULTRAM) 50 MG tablet Take by mouth Stewart 6 (six) hours as needed.     traZODone (DESYREL) 25 mg TABS tablet Take 25 mg by mouth at bedtime.     vitamin C (ASCORBIC ACID) 500 MG tablet Take 500 mg by mouth 2 (two) times daily.       Allergies as of 05/08/2023 - Review Complete 05/08/2023  Allergen Reaction Noted   Diphenhydramine Other (See Comments) 04/30/2011   Tape Dermatitis 11/20/2013    Family History  Problem Relation Age of Onset   Migraines Mother     Social History   Socioeconomic History   Marital status: Single    Spouse name: Not on file   Number of children: Not on file   Years of education: Not on file   Highest education level: Not on file  Occupational History   Not on file  Tobacco Use   Smoking status: Never   Smokeless tobacco: Never  Vaping Use   Vaping status: Never Used  Substance and Sexual Activity   Alcohol use: No   Drug use: No   Sexual activity: Never  Other Topics Concern   Not on file  Social History Narrative   03/16/21 lives at [facility]   Patient's legal guardian is Mr. Zebedee Iba. Phone number  [phone].   Social Drivers of Corporate investment banker Strain: Not on file  Food Insecurity: Not on file  Transportation Needs: Not on file  Physical Activity: Not on file  Stress: Not on file  Social Connections: Not on file  Intimate Partner Violence: Not on file    Review of Systems   Gen: Denies any fever, chills, loss of appetite, change in weight or  weight loss CV: Denies chest pain, heart palpitations, syncope, edema  Resp: Denies shortness of breath with rest, cough, wheezing, coughing up blood, and pleurisy. GI: denies hematochezia, nausea, vomiting, diarrhea, constipation, dysphagia, odyonophagia, early satiety or weight loss. +melena +decreased appetite +abdominal pain  GU : Denies urinary burning, blood in urine, urinary frequency, and urinary incontinence. MS: Denies joint pain, limitation of movement, swelling, cramps, and atrophy.  Derm: Denies rash, itching, dry skin, hives. Psych: Denies depression, anxiety, memory loss, hallucinations, and confusion. Heme: Denies bruising or bleeding Neuro:  Denies any headaches, dizziness, paresthesias, shaking  Physical Exam   Vital Signs in last 24 hours: Temp:  [98.8 F (37.1 C)] 98.8 F (37.1 C) (02/12 1002) Pulse Rate:  [78] 78 (02/12 1002) Resp:  [18] 18 (02/12 1002) BP: (111)/(58) 111/58 (02/12 1002) SpO2:  [95 %] 95 % (02/12 1002) Weight:  [63.5 kg] 63.5 kg (02/12 1000)   General:   Alert,  Well-developed, well-nourished, pleasant and cooperative in NAD Head:  Normocephalic and atraumatic. Eyes:  Sclera clear, no icterus.   Conjunctiva pink. Ears:  Normal auditory acuity. Mouth:  No deformity or lesions, dentition normal. Lungs:  Clear throughout to auscultation.   No wheezes, crackles, or rhonchi. No acute distress. Heart:  Regular rate and rhythm; no murmurs, clicks, rubs,  or gallops. Abdomen:  Soft, nontender and nondistended. No masses, hepatosplenomegaly or hernias noted. Normal bowel sounds, without guarding, and without rebound.   Neurologic:  Alert and  oriented to person, place and situation, disoriented to time  Skin:  Intact without significant lesions or rashes. Psych:  Alert and cooperative. Normal mood and affect.  Labs/Studies   Recent Labs No results for input(s): "WBC", "HGB", "HCT", "PLT" in the last 72 hours. BMET Recent Labs    05/08/23 1017   NA 141  K 3.2*  CL 108  CO2 26  GLUCOSE 100*  BUN 9  CREATININE 0.49  CALCIUM 8.3*   LFT Recent Labs    05/08/23 1017  PROT 6.1*  ALBUMIN 3.0*  AST 13*  ALT 9  ALKPHOS 82  BILITOT 0.4    Assessment   KHAMILLE BEYNON is a 78 y.o. year old female history of DVTs on Eliquis, history of psychosis, hypothyroidism who presented to the ED with anemia, melena and epigastric pain. GI consulted for further evaluation.    Anemia and heme positive stools:hgb 5.7 on arrival. Patient endorses melanotic stools as outpatient. She is chronically anticoagulated on eliquis with last dose was this morning at 8am per staff at patient's SNF. She also endorses decreased appetite and some generalized abdominal pain. Recommend proceeding with EGD once there has been time for eliquis washout as I cannot rule out PUD, gastritis, duodenitis, AVMs, malignancy. Indications, risks and benefits of procedure discussed in detail with patient. Patient verbalized understanding and is in agreement to proceed with EGD on Friday to give time for eliquis washout.    Plan / Recommendations   Continue to hold Eliquis PPI BID Trend h&h, transfuse for hgb <7 Monitor for overt GI bleeding  Plan for  EGD on Friday once eliquis has had time to washout  Amarianna Abplanalp L. Jeanmarie Hubert, MSN, APRN, AGNP-C Adult-Gerontology Nurse Practitioner [facility]

## 2023-05-08 NOTE — ED Triage Notes (Signed)
Pt was bib [facility] from [facility] for low hgb. Pt states he abd has been hurting for a few weeks, pt states she has been has dark stools.

## 2023-05-08 NOTE — H&P (Addendum)
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  CBC tested at the facility was noted to be 6.4 and she was sent to the emergency department.  Repeat CBC with hemoglobin of 5.7.  Patient tested guaiac positive in the stool.  Patient was started on IV pantoprazole and PRBC transfusion ordered and admission requested for management.  GI consultation requested in the ED and they will consult.     Past Medical History:  Diagnosis Date   Anemia    Anemia    Anxiety    Anxiety and depression    Bacteremia    Cellulitis    Chronic back pain    Chronic diarrhea    Chronic right hip pain    Complication of anesthesia    Depression    DVT (deep venous thrombosis) (HCC)    Dysphagia    GERD (gastroesophageal reflux disease)    Gout    Gout    Headache    Hyperlipidemia    Hypopotassemia    Hypothyroidism    IBS (irritable bowel syndrome)    Major depressive disorder    Muscle weakness (generalized)    Osteoporosis    Pneumonia    PONV (postoperative nausea and vomiting)    Pressure ulcer    Pressure ulcer of buttock    Psychosis (HCC)    Thyroid disease    hypothyroidism    Past Surgical History:  Procedure Laterality Date   COLONOSCOPY  2007   Dr. Benard Rink: Adenomatous colon polyp   ESOPHAGOGASTRODUODENOSCOPY N/A 07/09/2016   Procedure:  ESOPHAGOGASTRODUODENOSCOPY (EGD);  Surgeon: Corbin Ade, MD;  [facility];  Service: Endoscopy;  Laterality: N/A;   ESOPHAGOGASTRODUODENOSCOPY (EGD) WITH PROPOFOL N/A 04/18/2017   Procedure: ESOPHAGOGASTRODUODENOSCOPY (EGD) WITH PROPOFOL;  Surgeon: Corbin Ade, MD;  [facility];  Service: Endoscopy;  Laterality: N/A;  10:30am   HIP ARTHROPLASTY  05/01/2011   Procedure: right ARTHROPLASTY BIPOLAR HIP;  Surgeon: Fuller Canada, MD;  Location: [facility] ORS;  Service: Orthopedics;  Laterality: Right;   INCISION AND DRAINAGE HIP  02/08/2012   Procedure: IRRIGATION AND DEBRIDEMENT HIP;  Surgeon: Vickki Hearing, MD;  Location: [facility] OR[facility];  Service: Orthopedics;  Laterality: Right;   INCISION AND DRAINAGE OF WOUND  05/24/2011   Procedure: IRRIGATION AND DEBRIDEMENT WOUND;  Surgeon: Fuller Canada, MD;  Location: [facility] OR[facility];  Service: Orthopedics;  Laterality: Right;   MALONEY DILATION N/A 04/18/2017   Procedure: Elease Hashimoto DILATION;  Surgeon: Corbin Ade, MD;  Location: [facility];  Service: Endoscopy;  Laterality: N/A;     reports that she has never smoked. She has never used smokeless tobacco. She reports that she does not drink alcohol and does not use drugs.  Allergies  Allergen Reactions   Diphenhydramine Other (See Comments)    Feels bad   Tape Dermatitis    Family History  Problem Relation Age of Onset   Migraines Mother  Prior to Admission medications   Medication Sig Start Date End Date Taking? Authorizing Provider  acetaminophen (TYLENOL) 325 MG tablet Take 650 mg by mouth every 6 (six) hours as needed for fever.     [provider]  aspirin EC 81 MG tablet Take 81 mg by mouth daily.    [provider]  atorvastatin (LIPITOR) 10 MG tablet Take 10 mg by mouth at bedtime.     [provider]  Cholecalciferol (VITAMIN D3) 50000 units TABS Take by mouth every 14 (fourteen) days.    [provider]  citalopram (CELEXA) 20 MG  tablet Take 20 mg by mouth daily.    [provider]  dicyclomine (BENTYL) 20 MG tablet Take 20 mg by mouth 4 (four) times daily. 06/19/21   [provider]  Erenumab-aooe (AIMOVIG) 140 MG/ML SOAJ Inject 140 mg into the skin every 30 (thirty) days. 11/16/21   Ocie Doyne, MD  ferrous sulfate 325 (65 FE) MG tablet Take 325 mg by mouth 2 (two) times daily with a meal.     [provider]  ketoconazole (NIZORAL) 2 % cream Apply 1 Application topically daily. 10/18/21   Vivi Barrack, DPM  levothyroxine (SYNTHROID, LEVOTHROID) 50 MCG tablet Take 50 mcg by mouth daily before breakfast.     [provider]  LORazepam (ATIVAN) 0.5 MG tablet Take 0.5 mg by mouth 2 (two) times daily.    [provider]  memantine (NAMENDA) 10 MG tablet Take 10 mg by mouth 2 (two) times daily.    [provider]  nystatin (MYCOSTATIN/NYSTOP) powder Apply topically. 12/04/22   [provider]  OLANZapine (ZYPREXA) 5 MG tablet Take 5 mg by mouth at bedtime. 06/08/21   [provider]  omeprazole (PRILOSEC) 20 MG capsule Take 20 mg by mouth daily.    [provider]  pregabalin (LYRICA) 50 MG capsule Take 50 mg in AM, 50 mg in the afternoon, and 75 mg in PM. If no side effects after one week can increase to 75 mg in the morning, 50 mg in the afternoon, and 75 mg in PM 03/16/21   Chima, Victorino Dike, MD  pregabalin (LYRICA) 50 MG capsule Take 50 mg by mouth daily. Patient not taking: Reported on 02/13/2023    [provider]  Probiotic CAPS Take 1 capsule by mouth daily.    [provider]  QUEtiapine (SEROQUEL) 50 MG tablet Take 50 mg by mouth at bedtime.    [provider]  senna-docusate (SENOKOT-S) 8.6-50 MG tablet Take 2 tablets by mouth 2 (two) times daily.    [provider]  traMADol (ULTRAM) 50 MG tablet Take by mouth every 6 (six) hours as needed.    [provider]  traZODone (DESYREL) 25 mg  TABS tablet Take 25 mg by mouth at bedtime.    [provider]  vitamin C (ASCORBIC ACID) 500 MG tablet Take 500 mg by mouth 2 (two) times daily.     [provider]    Physical Exam: Vitals:   05/08/23 1000 05/08/23 1002  BP:  (!) 111/58  Pulse:  78  Resp:  18  Temp:  98.8 F (37.1 C)  TempSrc:  Oral  SpO2:  95%  Weight: 63.5 kg   Height: 5\' 7"  (1.702 m)     Constitutional: pt appears very pale in color; NAD, calm, comfortable Eyes: PERRL, lids and conjunctivae normal ENMT: Mucous membranes are pale, moist. Posterior pharynx clear of any exudate or  lesions.  Neck: normal, supple, no masses, no thyromegaly Respiratory: clear to auscultation bilaterally, no wheezing, no crackles. Normal respiratory effort. No accessory muscle use.  Cardiovascular: normal s1, s2 sounds, no murmurs / rubs / gallops. No extremity edema. 2+ pedal pulses. No carotid bruits.  Abdomen: no tenderness, no masses palpated. No hepatosplenomegaly. Bowel sounds positive.  Musculoskeletal: no clubbing / cyanosis. No joint deformity upper and lower extremities. Good ROM, no contractures. Normal muscle tone.  Skin: no rashes, lesions, ulcers. No induration Neurologic: CN 2-12 grossly intact. Sensation intact, DTR normal. Strength 5/5 in all 4.  Psychiatric: Diminished judgment and insight. Alert. Normal mood.   Labs on Admission: I have personally reviewed following labs and imaging studies  CBC: No results for input(s): "WBC", "NEUTROABS", "HGB", "HCT", "MCV", "PLT" in the last 168 hours. Basic Metabolic Panel: Recent Labs  Lab 05/08/23 1017  NA 141  K 3.2*  CL 108  CO2 26  GLUCOSE 100*  BUN 9  CREATININE 0.49  CALCIUM 8.3*   GFR: Estimated Creatinine Clearance: 56.4 mL/min (by C-G formula based on SCr of 0.49 mg/dL). Liver Function Tests: Recent Labs  Lab 05/08/23 1017  AST 13*  ALT 9  ALKPHOS 82  BILITOT 0.4  PROT 6.1*  ALBUMIN 3.0*   Recent Labs  Lab 05/08/23 1017   LIPASE 29   No results for input(s): "AMMONIA" in the last 168 hours. Coagulation Profile: No results for input(s): "INR", "PROTIME" in the last 168 hours. Cardiac Enzymes: No results for input(s): "CKTOTAL", "CKMB", "CKMBINDEX", "TROPONINI" in the last 168 hours. BNP (last 3 results) No results for input(s): "PROBNP" in the last 8760 hours. HbA1C: No results for input(s): "HGBA1C" in the last 72 hours. CBG: No results for input(s): "GLUCAP" in the last 168 hours. Lipid Profile: No results for input(s): "CHOL", "HDL", "LDLCALC", "TRIG", "CHOLHDL", "LDLDIRECT" in the last 72 hours. Thyroid Function Tests: No results for input(s): "TSH", "T4TOTAL", "FREET4", "T3FREE", "THYROIDAB" in the last 72 hours. Anemia Panel: No results for input(s): "VITAMINB12", "FOLATE", "FERRITIN", "TIBC", "IRON", "RETICCTPCT" in the last 72 hours. Urine analysis:    Component Value Date/Time   COLORURINE YELLOW 04/23/2017 1752   APPEARANCEUR CLEAR 04/23/2017 1752   LABSPEC 1.010 04/23/2017 1752   PHURINE 6.5 04/23/2017 1752   GLUCOSEU NEGATIVE 04/23/2017 1752   HGBUR NEGATIVE 04/23/2017 1752   BILIRUBINUR NEGATIVE 04/23/2017 1752   KETONESUR NEGATIVE 04/23/2017 1752   PROTEINUR NEGATIVE 04/23/2017 1752   UROBILINOGEN 0.2 03/28/2013 1435   NITRITE NEGATIVE 04/23/2017 1752   LEUKOCYTESUR NEGATIVE 04/23/2017 1752   Radiological Exams on Admission: No results found.  EKG: Independently reviewed.   Assessment/Plan Principal Problem:   Acute upper GI bleed Active Problems:   Acute on chronic blood loss anemia   Generalized abdominal pain   Guaiac positive stools   Psychosis (HCC)   Depression   Chronic pain   Decubitus ulcer of left buttock, stage 4 (HCC)   IBS (irritable bowel syndrome)   Hypokalemia   S/P hip replacement   Hypothyroidism   Memory deficits   Dysphagia   Acquired thrombophilia (HCC)   Acute upper GI bleed  -pt presenting with melanotic guaiac positive  stool -Hemoglobin down to 5.7 -Patient received apixaban this morning on 05/08/2023 per [facility] sent from [facility] -hold further apixaban -type and cross and transfuse 2 units PRBC -GI consultation requested -clear liquid diet for now  Acute on chronic blood loss anemia Symptomatic anemia - transfuse 2 units PRBC ordered in ED - follow  up Anemia Panel (pending) - follow CBC  - IV pantoprazole 40 mg BID ordered  GERD - pantoprazole ordered IV for GI protection   Generalized Abdominal Pain - only mild symptoms reported at this time - IV pantoprazole ordered - GI consultation requested   Hypothyroidism  - resume home levothyroxine when meds reconciled  Depression / Psychosis - resume home behavioral health medications when reconciled   Hypokalemia - chronic per records - potassium added to IV fluids - check magnesium  - follow BMP   Dysphagia - chronic - clear liquid diet for now - advance to dysphagia diet when ok with GI   Acquired Thrombophilia History of DVT  - hold apixaban for now - follow CBC  - SCDs for DVT prevention   DVT prophylaxis: SCDs   Code Status: FULL   Family Communication: not present during rounds   Disposition Plan: return to [facility] called: GI   Admission status: INP  Level of care: Telemetry Standley Dakins MD [facility] How to contact the [facility] Attending or Consulting provider 7A - 7P or covering provider during after hours 7P -7A, for this patient?  Check the care team in [facility] and look for a) attending/consulting TRH provider listed and b) the [facility] team listed Log into [email or web address] and use [facility]'s universal password to access. If you do not have the password, please contact the hospital operator. Locate the [facility] provider you are looking for under [facility] and page to a number that you can be directly reached. If you still have difficulty reaching the provider, please page the [facility] (Director on  Call) for the Hospitalists listed on amion for assistance.   If 7PM-7AM, please contact night-coverage [email or web address] Password [facility]  05/08/2023, 11:46 AM

## 2023-05-09 DIAGNOSIS — D62 Acute posthemorrhagic anemia: Secondary | ICD-10-CM

## 2023-05-09 DIAGNOSIS — E039 Hypothyroidism, unspecified: Secondary | ICD-10-CM | POA: Diagnosis not present

## 2023-05-09 DIAGNOSIS — R195 Other fecal abnormalities: Secondary | ICD-10-CM | POA: Diagnosis not present

## 2023-05-09 DIAGNOSIS — D649 Anemia, unspecified: Secondary | ICD-10-CM | POA: Diagnosis not present

## 2023-05-09 DIAGNOSIS — K922 Gastrointestinal hemorrhage, unspecified: Principal | ICD-10-CM | POA: Diagnosis present

## 2023-05-09 DIAGNOSIS — K921 Melena: Secondary | ICD-10-CM | POA: Diagnosis not present

## 2023-05-09 DIAGNOSIS — R1013 Epigastric pain: Secondary | ICD-10-CM

## 2023-05-09 LAB — TYPE AND SCREEN
ABO/RH(D): O POS
Antibody Screen: NEGATIVE
Unit division: 0
Unit division: 0

## 2023-05-09 LAB — BASIC METABOLIC PANEL
Anion gap: 6 (ref 5–15)
BUN: 7 mg/dL — ABNORMAL LOW (ref 8–23)
CO2: 26 mmol/L (ref 22–32)
Calcium: 8.3 mg/dL — ABNORMAL LOW (ref 8.9–10.3)
Chloride: 111 mmol/L (ref 98–111)
Creatinine, Ser: 0.49 mg/dL (ref 0.44–1.00)
GFR, Estimated: >60 mL/min (ref >60)
Glucose, Bld: 82 mg/dL (ref 70–99)
Potassium: 3.8 mmol/L (ref 3.5–5.1)
Sodium: 143 mmol/L (ref 135–145)

## 2023-05-09 LAB — BPAM RBC
Blood Product Expiration Date: 202503122359
Blood Product Expiration Date: 202503122359
ISSUE DATE / TIME: 202502121639
ISSUE DATE / TIME: 202502121959
Unit Type and Rh: 5100
Unit Type and Rh: 5100

## 2023-05-09 LAB — CBC
HCT: 26.7 % — ABNORMAL LOW (ref 36.0–46.0)
Hemoglobin: 8.4 g/dL — ABNORMAL LOW (ref 12.0–15.0)
MCH: 28.6 pg (ref 26.0–34.0)
MCHC: 31.5 g/dL (ref 30.0–36.0)
MCV: 90.8 fL (ref 80.0–100.0)
Platelets: 355 10*3/uL (ref 150–400)
RBC: 2.94 MIL/uL — ABNORMAL LOW (ref 3.87–5.11)
RDW: 17.5 % — ABNORMAL HIGH (ref 11.5–15.5)
WBC: 4.2 10*3/uL (ref 4.0–10.5)
nRBC: 0 % (ref 0.0–0.2)

## 2023-05-09 LAB — PROTIME-INR
INR: 1.2 (ref 0.8–1.2)
Prothrombin Time: 15.7 s — ABNORMAL HIGH (ref 11.4–15.2)

## 2023-05-09 LAB — MAGNESIUM: Magnesium: 1.9 mg/dL (ref 1.7–2.4)

## 2023-05-09 NOTE — Progress Notes (Signed)
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  CBC tested at the facility was noted to be 6.4 and she was sent to the emergency department.  Repeat CBC with hemoglobin of 5.7.  Patient tested guaiac positive in the stool.  Patient was started on IV pantoprazole and PRBC transfusion ordered and admission requested for management.  GI consultation requested in the ED and they will consult.    Assessment and Plan:  Acute upper GI bleed  -pt presenting with melanotic guaiac positive stool -Hemoglobin down to 5.7 on admission lab -Patient received apixaban this morning on 05/08/2023 per [facility] sent from [facility] -hold further apixaban -type and cross and transfused 2 units PRBC 2/12, Hg up to 8.4 -GI consultation requested and planning EGD on 2/14 -clear liquid diet for now   Acute on chronic blood loss anemia Symptomatic anemia - transfused 2 units PRBC ordered in ED on 2/12 - follow up Anemia Panel: Fe 12, ferritin 15, B12 383 - follow CBC  - IV pantoprazole 40 mg BID ordered   GERD - pantoprazole ordered IV for GI protection    Generalized Abdominal Pain - only mild symptoms reported at this time - IV pantoprazole ordered - GI consultation planning EGD on 2/14     Hypothyroidism  - resumed home levothyroxine   Depression / Psychosis - resumed home behavioral health medications     Hypokalemia - repleted  - chronic per records - potassium added to IV  fluids - check magnesium  - follow BMP    Dysphagia - chronic - clear liquid diet for now - advance to dysphagia diet when ok with GI    Acquired Thrombophilia History of DVT  - hold apixaban for now - follow CBC  - SCDs for DVT prevention   DVT prophylaxis: SCDs Code Status: FULL  Family Communication: none present during rounds  Disposition: anticipate return to [facility]   Consultants:  GI Procedures:  EGD tentatively planned for 05/10/2023 Antimicrobials:    Subjective: No specific complaints today, no reports of melanotic stool or bright red blood however patient has dementia  Objective: Vitals:   05/08/23 2245 05/08/23 2333 05/09/23 0422 05/09/23 0920  BP: 138/68 (!) 133/58 (!) 144/70 (!) 146/71  Pulse: 66 60 73 76  Resp: 18 16 14    Temp: 98.5 F (36.9 C) 98.3 F (36.8 C) (!) 97.2 F (36.2 C) 98.8 F (37.1 C)  TempSrc: Oral   Oral  SpO2: 97% 95% 97% 100%  Weight:      Height:        Intake/Output Summary (Last 24 hours) at 05/09/2023 1357 Last data filed at 05/09/2023 1100 Gross per 24 hour  Intake 4167.88 ml  Output 1000 ml  Net 3167.88 ml   Filed Weights   05/08/23 1000  Weight: 63.5 kg   Examination:  General exam: Appears emaciated but calm and comfortable  Respiratory system: Clear to auscultation. Respiratory effort normal. Cardiovascular system: normal S1 & S2 heard. No JVD, murmurs, rubs, gallops  or clicks. No pedal edema. Gastrointestinal system: Abdomen is nondistended, soft and nontender. No organomegaly or masses felt. Normal bowel sounds heard. Central nervous system: Alert and oriented. No focal neurological deficits. Extremities: Symmetric 5 x 5 power. Skin: No rashes, lesions or ulcers. Psychiatry: Judgement and insight appear diminished. Mood & affect appropriate.   Data Reviewed: I have personally reviewed following labs and imaging studies  CBC: Recent Labs  Lab 05/08/23 1017 05/09/23 0330  WBC 3.9* 4.2   NEUTROABS 2.3  --   HGB 5.7* 8.4*  HCT 20.4* 26.7*  MCV 92.7 90.8  PLT 387 355    Basic Metabolic Panel: Recent Labs  Lab 05/08/23 1017 05/09/23 0330  NA 141 143  K 3.2* 3.8  CL 108 111  CO2 26 26  GLUCOSE 100* 82  BUN 9 7*  CREATININE 0.49 0.49  CALCIUM 8.3* 8.3*  MG  --  1.9    CBG: No results for input(s): "GLUCAP" in the last 168 hours.  No results found for this or any previous visit (from the past 240 hours).   Radiology Studies: No results found.  Scheduled Meds:  ascorbic acid  500 mg Oral Daily   atorvastatin  10 mg Oral QHS   calcitonin (salmon)  1 spray Alternating Nares Daily   cholecalciferol  1,000 Units Oral Daily   cyanocobalamin  1,000 mcg Oral Daily   dicyclomine  20 mg Oral QID   ferrous sulfate  325 mg Oral Q breakfast   levothyroxine  50 mcg Oral QAC breakfast   memantine  10 mg Oral BID   OLANZapine  10 mg Oral QHS   pantoprazole (PROTONIX) IV  40 mg Intravenous Q12H   venlafaxine XR  75 mg Oral Daily   Continuous Infusions:   LOS: 1 day   Time spent: 56 mins  Nohemy Koop Laural Benes, MD How to contact the [facility] Attending or Consulting provider 7A - 7P or covering provider during after hours 7P -7A, for this patient?  Check the care team in [facility] and look for a) attending/consulting TRH provider listed and b) the [facility] team listed Log into [email or web address] to find provider on call.  Locate the [facility] provider you are looking for under [facility] and page to a number that you can be directly reached. If you still have difficulty reaching the provider, please page the [facility] (Director on Call) for the Hospitalists listed on amion for assistance.  05/09/2023, 1:57 PM

## 2023-05-09 NOTE — H&P (View-Only) (Signed)
   Gastroenterology Progress Note   Referring Provider: No ref. provider found Primary Care Physician:  Terri Pander, MD Primary Gastroenterologist:  Terri Sessions, MD  Patient ID: [name]; [id number]; Jan 20, 1946   Subjective:    No complaints.   Objective:   Vital signs in last 24 hours: Temp:  [97.2 F (36.2 C)-98.8 F (37.1 C)] 98.7 F (37.1 C) (02/13 1437) Pulse Rate:  [60-84] 77 (02/13 1437) Resp:  [11-19] 14 (02/13 0422) BP: (103-155)/(38-71) 155/59 (02/13 1437) SpO2:  [95 %-100 %] 96 % (02/13 1437) Last BM Date : 05/07/23 General:   Alert,  Well-developed, well-nourished, pleasant and cooperative in NAD Head:  Normocephalic and atraumatic. Eyes:  Sclera clear, no icterus.  Chest: CTA bilaterally without rales, rhonchi, crackles.    Heart:  Regular rate and rhythm; no murmurs, clicks, rubs,  or gallops. Abdomen:  Soft, nontender and nondistended. No masses, hepatosplenomegaly or hernias noted. Normal bowel sounds, without guarding, and without rebound.   Extremities:  Without clubbing, deformity or edema. Neurologic:  Alert and  oriented x4;  grossly normal neurologically. Skin:  Intact without significant lesions or rashes. Psych:  Alert and cooperative. Normal mood and affect.  Intake/Output from previous day: 02/12 0701 - 02/13 0700 In: 4167.9 [P.O.:100; I.V.:775.9; Blood:3292] Out: 400 [Urine:400] Intake/Output this shift: Total I/O In: -  Out: 600 [Urine:600]  Lab Results: CBC Recent Labs    05/08/23 1017 05/09/23 0330  WBC 3.9* 4.2  HGB 5.7* 8.4*  HCT 20.4* 26.7*  MCV 92.7 90.8  PLT 387 355   BMET Recent Labs    05/08/23 1017 05/09/23 0330  NA 141 143  K 3.2* 3.8  CL 108 111  CO2 26 26  GLUCOSE 100* 82  BUN 9 7*  CREATININE 0.49 0.49  CALCIUM 8.3* 8.3*   LFTs Recent Labs    05/08/23 1017  BILITOT 0.4  ALKPHOS 82  AST 13*  ALT 9  PROT 6.1*  ALBUMIN 3.0*   Recent Labs    05/08/23 1017  LIPASE 29   PT/INR Recent  Labs    05/09/23 0330  LABPROT 15.7*  INR 1.2         Imaging Studies: No results found.[2 weeks]  Assessment/Plan:   [name] is a 78 y.o. year old female history of DVTs on Eliquis, history of psychosis, hypothyroidism who presented to the ED with anemia, melena and epigastric pain. GI consulted for further evaluation.    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  I have discussed the risks, alternatives, benefits with regards to but not limited to the risk of reaction to medication, bleeding, infection, perforation and with the patient and her legal guardian, [name], and both agreeable to proceed. Written consent to be obtained. -PPI BID     LOS: 1 day   Leanna Battles. Dixon Boos [facility] [phone] 2/13/20253:16 PM

## 2023-05-09 NOTE — Progress Notes (Signed)
Attempted call to  [name] legal guardian of patient to obtain verbal consent for egd no answer. Voicemail left with call back number.

## 2023-05-09 NOTE — Plan of Care (Signed)

## 2023-05-09 NOTE — TOC Initial Note (Signed)
Transition of Care [facility]) - Initial/Assessment Note    Patient Details  Name: [name] MRN: [id number] Date of Birth: 07-06-1945  Transition of Care [facility]) CM/SW Contact:    Leitha Bleak, RN Phone Number: 05/09/2023, 9:51 AM  Clinical Narrative:     Patient admitted with acute upper GI bleed. Patient is LTC at [facility].  [name] from the [employer] called to say she is now her legal guardian.  TOC added to chart.              Expected Discharge Plan: Long Term Acute Care (LTAC) Barriers to Discharge: Continued Medical Work up   Patient Goals and CMS Choice Patient states their goals for this hospitalization and ongoing recovery are:: return to LTC CMS Medicare.gov Compare Post Acute Care list provided to:: Patient Represenative (must comment) Choice offered to / list presented to : [facility] POA / Guardian      Expected Discharge Plan and Services       Living arrangements for the past 2 months: Skilled Nursing Facility                      Prior Living Arrangements/Services Living arrangements for the past 2 months: Skilled Nursing Facility Lives with:: Facility Resident          Activities of Daily Living   ADL Screening (condition at time of admission) Independently performs ADLs?: No Does the patient have a NEW difficulty with bathing/dressing/toileting/self-feeding that is expected to last >3 days?: No Does the patient have a NEW difficulty with getting in/out of bed, walking, or climbing stairs that is expected to last >3 days?: No Does the patient have a NEW difficulty with communication that is expected to last >3 days?: No Is the patient deaf or have difficulty hearing?: No Does the patient have difficulty seeing, even when wearing glasses/contacts?: No Does the patient have difficulty concentrating, remembering, or making decisions?: No  Permission Sought/Granted         Permission granted to share info w Contact Information: LEGAL  GUARDIAN  Emotional Assessment         Alcohol / Substance Use: Not Applicable Psych Involvement: No (comment)  Admission diagnosis:  Upper GI bleed [K92.2] Acute upper GI bleed [K92.2] Patient Active Problem List   Diagnosis Date Noted   Acute upper GI bleed 05/08/2023   Acquired thrombophilia (HCC) 05/08/2023   Generalized abdominal pain 05/08/2023   Guaiac positive stools 05/08/2023   Symptomatic anemia 05/08/2023   Acute on chronic blood loss anemia 07/17/2017   Diarrhea 07/17/2017   Dysphagia 03/06/2017   Food impaction of esophagus 08/06/2016   Healthcare-associated pneumonia 05/16/2016   Acute respiratory failure with hypoxia (HCC) 05/16/2016   Acute encephalopathy 05/16/2016   Hypothyroidism 05/16/2016   Influenza 05/16/2016   Dementia / Memory deficits 05/16/2016   S/P hip replacement 02/28/2012   Mechanical complication of internal orthopedic implant (HCC) 02/28/2012   Cellulitis of right hip 01/13/2012   Psychosis (HCC) 01/13/2012   Depression 01/13/2012   Gout 01/13/2012   Chronic pain 01/13/2012   Decubitus ulcer of left buttock, stage 4 (HCC) 01/13/2012   IBS (irritable bowel syndrome) 01/13/2012   Hypokalemia 01/13/2012   Anemia, chronic disease 01/13/2012   Herniated disc 12/05/2011   Seroma complicating a procedure 06/05/2011   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) 06/05/2011   Wound infection after surgery 05/22/2011   Fall 04/30/2011   Hip fracture, right (HCC) 04/30/2011  CLOSED FRACTURE OF HEAD OF RADIUS 01/09/2010   Closed dislocation of elbow 01/09/2010   PCP:  Charlynne Pander, MD Pharmacy:   [facility] - [place], [place] - [address] [place] [address] Phone: [phone] Fax: [phone]   Social Drivers of Health (SDOH) Social History: SDOH Screenings   Food Insecurity: No Food Insecurity (05/08/2023)  Housing: Unknown (05/08/2023)  Transportation Needs:  No Transportation Needs (05/08/2023)  Utilities: Not At Risk (05/08/2023)  Social Connections: Patient Unable To Answer (05/09/2023)  Tobacco Use: Low Risk  (05/08/2023)   SDOH Interventions:    Readmission Risk Interventions    05/09/2023    9:50 AM  Readmission Risk Prevention Plan  Transportation Screening Complete  PCP or Specialist Appt within 5-7 Days Not Complete  Home Care Screening Complete  Medication Review (RN CM) Complete

## 2023-05-09 NOTE — Progress Notes (Addendum)
   Gastroenterology Progress Note   Referring Provider: No ref. provider found Primary Care Physician:  Charlynne Pander, MD Primary Gastroenterologist:  Roetta Sessions, MD  Patient ID: [name]; [id number]; Jan 20, 1946   Subjective:    No complaints.   Objective:   Vital signs in last 24 hours: Temp:  [97.2 F (36.2 C)-98.8 F (37.1 C)] 98.7 F (37.1 C) (02/13 1437) Pulse Rate:  [60-84] 77 (02/13 1437) Resp:  [11-19] 14 (02/13 0422) BP: (103-155)/(38-71) 155/59 (02/13 1437) SpO2:  [95 %-100 %] 96 % (02/13 1437) Last BM Date : 05/07/23 General:   Alert,  Well-developed, well-nourished, pleasant and cooperative in NAD Head:  Normocephalic and atraumatic. Eyes:  Sclera clear, no icterus.  Chest: CTA bilaterally without rales, rhonchi, crackles.    Heart:  Regular rate and rhythm; no murmurs, clicks, rubs,  or gallops. Abdomen:  Soft, nontender and nondistended. No masses, hepatosplenomegaly or hernias noted. Normal bowel sounds, without guarding, and without rebound.   Extremities:  Without clubbing, deformity or edema. Neurologic:  Alert and  oriented x4;  grossly normal neurologically. Skin:  Intact without significant lesions or rashes. Psych:  Alert and cooperative. Normal mood and affect.  Intake/Output from previous day: 02/12 0701 - 02/13 0700 In: 4167.9 [P.O.:100; I.V.:775.9; Blood:3292] Out: 400 [Urine:400] Intake/Output this shift: Total I/O In: -  Out: 600 [Urine:600]  Lab Results: CBC Recent Labs    05/08/23 1017 05/09/23 0330  WBC 3.9* 4.2  HGB 5.7* 8.4*  HCT 20.4* 26.7*  MCV 92.7 90.8  PLT 387 355   BMET Recent Labs    05/08/23 1017 05/09/23 0330  NA 141 143  K 3.2* 3.8  CL 108 111  CO2 26 26  GLUCOSE 100* 82  BUN 9 7*  CREATININE 0.49 0.49  CALCIUM 8.3* 8.3*   LFTs Recent Labs    05/08/23 1017  BILITOT 0.4  ALKPHOS 82  AST 13*  ALT 9  PROT 6.1*  ALBUMIN 3.0*   Recent Labs    05/08/23 1017  LIPASE 29   PT/INR Recent  Labs    05/09/23 0330  LABPROT 15.7*  INR 1.2         Imaging Studies: No results found.[2 weeks]  Assessment/Plan:   [name] is a 78 y.o. year old female history of DVTs on Eliquis, history of psychosis, hypothyroidism who presented to the ED with anemia, melena and epigastric pain. GI consulted for further evaluation.    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  I have discussed the risks, alternatives, benefits with regards to but not limited to the risk of reaction to medication, bleeding, infection, perforation and with the patient and her legal guardian, [name], and both agreeable to proceed. Written consent to be obtained. -PPI BID     LOS: 1 day   Leanna Battles. Dixon Boos [facility] [phone] 2/13/20253:16 PM

## 2023-05-09 NOTE — Progress Notes (Signed)
Left message on voicemail for [name] legal guardian to return call.

## 2023-05-09 NOTE — Plan of Care (Signed)
Problem: Activity: Goal: Risk for activity intolerance will decrease Outcome: Progressing   Problem: Coping: Goal: Level of anxiety will decrease Outcome: Progressing   Problem: Pain Managment: Goal: General experience of comfort will improve and/or be controlled Outcome: Progressing

## 2023-05-10 ENCOUNTER — Inpatient Hospital Stay (HOSPITAL_COMMUNITY): Payer: Medicare (Managed Care) | Admitting: Certified Registered"

## 2023-05-10 ENCOUNTER — Encounter (HOSPITAL_COMMUNITY)
Admission: EM | Disposition: A | Discharge status: Skilled Nursing Facility | Payer: Self-pay | Source: Skilled Nursing Facility | Attending: Family Medicine

## 2023-05-10 ENCOUNTER — Encounter (HOSPITAL_COMMUNITY): Payer: Self-pay | Admitting: Family Medicine

## 2023-05-10 DIAGNOSIS — K5710 Diverticulosis of small intestine without perforation or abscess without bleeding: Secondary | ICD-10-CM

## 2023-05-10 DIAGNOSIS — K921 Melena: Secondary | ICD-10-CM

## 2023-05-10 DIAGNOSIS — K2970 Gastritis, unspecified, without bleeding: Secondary | ICD-10-CM | POA: Diagnosis present

## 2023-05-10 DIAGNOSIS — K2950 Unspecified chronic gastritis without bleeding: Secondary | ICD-10-CM

## 2023-05-10 DIAGNOSIS — E039 Hypothyroidism, unspecified: Secondary | ICD-10-CM | POA: Diagnosis not present

## 2023-05-10 DIAGNOSIS — K449 Diaphragmatic hernia without obstruction or gangrene: Secondary | ICD-10-CM

## 2023-05-10 DIAGNOSIS — K922 Gastrointestinal hemorrhage, unspecified: Secondary | ICD-10-CM | POA: Diagnosis not present

## 2023-05-10 DIAGNOSIS — D62 Acute posthemorrhagic anemia: Secondary | ICD-10-CM | POA: Diagnosis not present

## 2023-05-10 DIAGNOSIS — F418 Other specified anxiety disorders: Secondary | ICD-10-CM

## 2023-05-10 DIAGNOSIS — K3189 Other diseases of stomach and duodenum: Secondary | ICD-10-CM | POA: Diagnosis not present

## 2023-05-10 DIAGNOSIS — R195 Other fecal abnormalities: Secondary | ICD-10-CM | POA: Diagnosis not present

## 2023-05-10 HISTORY — PX: ESOPHAGOGASTRODUODENOSCOPY (EGD) WITH PROPOFOL: SHX5813

## 2023-05-10 HISTORY — PX: BIOPSY: SHX5522

## 2023-05-10 LAB — CBC
HCT: 25.8 % — ABNORMAL LOW (ref 36.0–46.0)
Hemoglobin: 8.2 g/dL — ABNORMAL LOW (ref 12.0–15.0)
MCH: 28.5 pg (ref 26.0–34.0)
MCHC: 31.8 g/dL (ref 30.0–36.0)
MCV: 89.6 fL (ref 80.0–100.0)
Platelets: 355 10*3/uL (ref 150–400)
RBC: 2.88 MIL/uL — ABNORMAL LOW (ref 3.87–5.11)
RDW: 17.3 % — ABNORMAL HIGH (ref 11.5–15.5)
WBC: 4.7 10*3/uL (ref 4.0–10.5)
nRBC: 0 % (ref 0.0–0.2)

## 2023-05-10 LAB — BASIC METABOLIC PANEL
Anion gap: 7 (ref 5–15)
BUN: 5 mg/dL — ABNORMAL LOW (ref 8–23)
CO2: 25 mmol/L (ref 22–32)
Calcium: 8.5 mg/dL — ABNORMAL LOW (ref 8.9–10.3)
Chloride: 110 mmol/L (ref 98–111)
Creatinine, Ser: 0.53 mg/dL (ref 0.44–1.00)
GFR, Estimated: >60 mL/min (ref >60)
Glucose, Bld: 90 mg/dL (ref 70–99)
Potassium: 3.8 mmol/L (ref 3.5–5.1)
Sodium: 142 mmol/L (ref 135–145)

## 2023-05-10 LAB — MAGNESIUM: Magnesium: 1.8 mg/dL (ref 1.7–2.4)

## 2023-05-10 SURGERY — ESOPHAGOGASTRODUODENOSCOPY (EGD) WITH PROPOFOL
Anesthesia: General

## 2023-05-10 MED ORDER — PROPOFOL 10 MG/ML IV BOLUS
INTRAVENOUS | Status: DC | PRN
  Administered 2023-05-10: 80 mg via INTRAVENOUS

## 2023-05-10 MED ORDER — METOCLOPRAMIDE HCL 5 MG/ML IJ SOLN: 10.0000 mg | Freq: Once | INTRAMUSCULAR | Status: DC | PRN

## 2023-05-10 MED ORDER — LIDOCAINE HCL (CARDIAC) PF 100 MG/5ML IV SOSY
PREFILLED_SYRINGE | INTRAVENOUS | Status: DC | PRN
  Administered 2023-05-10: 60 mg via INTRATRACHEAL

## 2023-05-10 MED ORDER — EPHEDRINE 5 MG/ML INJ
INTRAVENOUS | Status: AC
  Filled 2023-05-10: qty 5

## 2023-05-10 MED ORDER — PROPOFOL 500 MG/50ML IV EMUL
INTRAVENOUS | Status: DC | PRN
  Administered 2023-05-10: 125 ug/kg/min via INTRAVENOUS

## 2023-05-10 MED ORDER — SODIUM CHLORIDE 0.9 % IV SOLN: INTRAVENOUS | Status: DC

## 2023-05-10 MED ORDER — PHENYLEPHRINE 80 MCG/ML (10ML) SYRINGE FOR IV PUSH (FOR BLOOD PRESSURE SUPPORT)
PREFILLED_SYRINGE | INTRAVENOUS | Status: AC
  Filled 2023-05-10: qty 10

## 2023-05-10 MED ORDER — BISACODYL 5 MG PO TBEC
10.0000 mg | DELAYED_RELEASE_TABLET | Freq: Once | ORAL | Status: AC
  Administered 2023-05-10: 10 mg via ORAL
  Filled 2023-05-10 (×2): qty 2

## 2023-05-10 MED ORDER — LACTATED RINGERS IV SOLN: INTRAVENOUS | Status: DC | PRN

## 2023-05-10 MED ORDER — MEPERIDINE HCL 50 MG/ML IJ SOLN: 6.2500 mg | INTRAMUSCULAR | Status: DC | PRN

## 2023-05-10 MED ORDER — PEG 3350-KCL-NA BICARB-NACL 420 G PO SOLR
4000.0000 mL | Freq: Once | ORAL | Status: AC
  Administered 2023-05-10: 4000 mL via ORAL
  Filled 2023-05-10: qty 4000

## 2023-05-10 MED ORDER — FENTANYL CITRATE (PF) 100 MCG/2ML IJ SOLN: 25.0000 ug | INTRAMUSCULAR | Status: DC | PRN

## 2023-05-10 MED ORDER — PHENYLEPHRINE 80 MCG/ML (10ML) SYRINGE FOR IV PUSH (FOR BLOOD PRESSURE SUPPORT)
PREFILLED_SYRINGE | INTRAVENOUS | Status: DC | PRN
  Administered 2023-05-10 (×2): 160 ug via INTRAVENOUS

## 2023-05-10 MED ORDER — PROPOFOL 500 MG/50ML IV EMUL
INTRAVENOUS | Status: AC
Start: 2023-05-10 — End: ?
  Filled 2023-05-10: qty 50

## 2023-05-10 MED ORDER — LACTATED RINGERS IV SOLN: INTRAVENOUS | Status: DC

## 2023-05-10 NOTE — Anesthesia Postprocedure Evaluation (Signed)
Anesthesia Post Note  Patient: MEGHANNE PLETZ  Procedure(s) Performed: ESOPHAGOGASTRODUODENOSCOPY (EGD) WITH PROPOFOL BIOPSY  Patient location during evaluation: PACU Anesthesia Type: General Level of consciousness: awake and alert Pain management: pain level controlled Vital Signs Assessment: post-procedure vital signs reviewed and stable Respiratory status: spontaneous breathing, nonlabored ventilation and respiratory function stable Cardiovascular status: blood pressure returned to baseline and stable Postop Assessment: no apparent nausea or vomiting Anesthetic complications: no   No notable events documented.   Last Vitals:  Vitals:   05/10/23 1400 05/10/23 1415  BP: 123/68 (!) 150/52  Pulse: 64 74  Resp: 16 14  Temp:    SpO2: 96% 96%    Last Pain:  Vitals:   05/10/23 1354  TempSrc:   PainSc: 0-No pain                 Roslynn Amble

## 2023-05-10 NOTE — Plan of Care (Signed)

## 2023-05-10 NOTE — Anesthesia Preprocedure Evaluation (Addendum)
Anesthesia Evaluation  Patient identified by MRN, date of birth, ID band Patient awake    Reviewed: Allergy & Precautions, H&P , NPO status , Patient's Chart, lab work & pertinent test results  History of Anesthesia Complications (+) PONV and history of anesthetic complications  Airway Mallampati: II  TM Distance: >3 FB Neck ROM: Full    Dental no notable dental hx. (+) Edentulous Lower, Edentulous Upper   Pulmonary neg pulmonary ROS, pneumonia   Pulmonary exam normal breath sounds clear to auscultation       Cardiovascular negative cardio ROS Normal cardiovascular exam Rhythm:Regular Rate:Normal     Neuro/Psych  Headaches PSYCHIATRIC DISORDERS Anxiety Depression   Dementia negative neurological ROS  negative psych ROS   GI/Hepatic negative GI ROS, Neg liver ROS,GERD  ,,  Endo/Other  negative endocrine ROSHypothyroidism    Renal/GU negative Renal ROS  negative genitourinary   Musculoskeletal negative musculoskeletal ROS (+)    Abdominal  (+) + obese  Peds negative pediatric ROS (+)  Hematology negative hematology ROS (+) Blood dyscrasia, anemia   Anesthesia Other Findings   Reproductive/Obstetrics negative OB ROS                              Anesthesia Physical Anesthesia Plan  ASA: 3  Anesthesia Plan: General   Post-op Pain Management: Minimal or no pain anticipated   Induction: Intravenous  PONV Risk Score and Plan: 1 and Propofol infusion  Airway Management Planned: Simple Face Mask and Nasal Cannula  Additional Equipment:   Intra-op Plan:   Post-operative Plan:   Informed Consent: I have reviewed the patients History and Physical, chart, labs and discussed the procedure including the risks, benefits and alternatives for the proposed anesthesia with the patient or authorized representative who has indicated his/her understanding and acceptance.       Plan  Discussed with: CRNA  Anesthesia Plan Comments:        Anesthesia Quick Evaluation

## 2023-05-10 NOTE — Progress Notes (Signed)
Patient underwent EGD under propofol sedation.  Tolerated the procedure adequately.   FINDINGS:  - Normal esophagus.  - 6 cm hiatal hernia. - Erythematous mucosa in the stomach. Biopsied.  - Normal duodenal bulb and second portion of the duodenum.  - Duodenal diverticulum.   This EGD does not explain the cause of severe anemia. Perhaps patient had cameron lesions in setting of large hiatal hernia which have healed with PPI , there was some erythema near the GE junction  RECOMMENDATIONS  Given severity of anemia discussed risk, inidication and limitation of colonoscopy for which NOK Chestine Spore, Dorathy Daft) consented for over the phone   Colonoscopy tomorrow   Prep today with 4 liters Golytely starting now   Keep NPO past midnight except prep   Clears today   Repeat labs in the AM ( CBC, CMP and INR)  Vista Lawman, MD Gastroenterology and Hepatology [facility]

## 2023-05-10 NOTE — TOC Progression Note (Signed)
Transition of Care [facility]) - Progression Note    Patient Details  Name: [name] MRN: [id number] Date of Birth: [birth year]  [facility] CM/SW Contact  Elliot Gault, LCSW Phone Number: 05/10/2023, 10:55 AM  Clinical Narrative:     TOC following. MD anticipating dc back to [facility]. Updated Debbie at [facility] who states that they can re-admit pt tomorrow.  Updated pt's legal guardian, Dorathy Daft, who gives consent for transport back to [facility] over the weekend.   TOC will follow up in AM.  Expected Discharge Plan: Long Term Acute Care (LTAC) Barriers to Discharge: Continued Medical Work up  Expected Discharge Plan and Services       Living arrangements for the past 2 months: Skilled Nursing Facility                                       Social Determinants of Health (SDOH) Interventions SDOH Screenings   Food Insecurity: No Food Insecurity (05/08/2023)  Housing: Unknown (05/08/2023)  Transportation Needs: No Transportation Needs (05/08/2023)  Utilities: Not At Risk (05/08/2023)  Social Connections: Patient Unable To Answer (05/09/2023)  Tobacco Use: Low Risk  (05/08/2023)    Readmission Risk Interventions    05/09/2023    9:50 AM  Readmission Risk Prevention Plan  Transportation Screening Complete  PCP or Specialist Appt within 5-7 Days Not Complete  Home Care Screening Complete  Medication Review (RN CM) Complete

## 2023-05-10 NOTE — Plan of Care (Signed)
Problem: Education: Goal: Knowledge of General Education information will improve Description: Including pain rating scale, medication(s)/side effects and non-pharmacologic comfort measures Outcome: Progressing   Problem: Clinical Measurements: Goal: Diagnostic test results will improve Outcome: Progressing   Problem: Nutrition: Goal: Adequate nutrition will be maintained Outcome: Progressing

## 2023-05-10 NOTE — Interval H&P Note (Signed)
History and Physical Interval Note:  05/10/2023 1:17 PM  [name]  has presented today for surgery, with the diagnosis of hemoccult positive stool, melena, anemia, abdominal pain.  The various methods of treatment have been discussed with the patient and family. After consideration of risks, benefits and other options for treatment, the patient has consented to  Procedure(s): ESOPHAGOGASTRODUODENOSCOPY (EGD) WITH PROPOFOL (N/A) as a surgical intervention.  The patient's history has been reviewed, patient examined, no change in status, stable for surgery.  I have reviewed the patient's chart and labs.  Questions were answered to the patient's satisfaction.     Consent obtained by the [facility]  [name]

## 2023-05-10 NOTE — Op Note (Signed)
[facility] Patient Name: [name] Procedure Date: 05/10/2023 1:06 PM MRN: [id number] Date of Birth: [birth year] Attending MD: Sanjuan Dame , MD, [id number] CSN: [id number] Age: 78 Admit Type: Inpatient Procedure:                Upper GI endoscopy Indications:              Melena Providers:                Sanjuan Dame, MD, Edrick Kins, RN, Francoise Ceo                            RN, RN, Elinor Parkinson Referring MD:              Medicines:                Monitored Anesthesia Care Complications:            No immediate complications. Estimated Blood Loss:     Estimated blood loss: none. Estimated blood loss:                            none. Procedure:                Pre-Anesthesia Assessment:                           - Prior to the procedure, a History and Physical                            was performed, and patient medications and                            allergies were reviewed. The patient's tolerance of                            previous anesthesia was also reviewed. The risks                            and benefits of the procedure and the sedation                            options and risks were discussed with the patient.                            All questions were answered, and informed consent                            was obtained. Prior Anticoagulants: The patient has                            taken no anticoagulant or antiplatelet agents. ASA                            Grade Assessment: IV - A patient with severe  systemic disease that is a constant threat to life.                            After reviewing the risks and benefits, the patient                            was deemed in satisfactory condition to undergo the                            procedure.                           After obtaining informed consent, the endoscope was                            passed under direct vision. Throughout the                             procedure, the patient's blood pressure, pulse, and                            oxygen saturations were monitored continuously. The                            [id number]-[id number] ([id number]) scope was introduced through the                            mouth, and advanced to the second part of duodenum.                            The upper GI endoscopy was accomplished without                            difficulty. The patient tolerated the procedure                            well. Scope In: 1:37:05 PM Scope Out: 1:44:52 PM Total Procedure Duration: 0 hours 7 minutes 47 seconds  Findings:      The examined esophagus was normal.      A 6 cm hiatal hernia was present.      Mildly erythematous mucosa without bleeding was found in the entire       examined stomach. Biopsies were taken with a cold forceps for histology.      The duodenal bulb and second portion of the duodenum were normal.      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 Impression:               - Normal esophagus.                           - 6 cm hiatal hernia.                           - Erythematous mucosa in the stomach. Biopsied.                           -  Normal duodenal bulb and second portion of the                            duodenum.                           - Duodenal diverticulum.                           This EGD does not explain the cause of severe                            anemia. Perhaps patient had cameron lesion in                            setting of large hiatal hernia which have healed                            with PPI , there was some erythema near the GE                            junction Moderate Sedation:      Per Anesthesia Care Recommendation:           Given severity of anemia discussed risk,                            inidication and limitation of colonoscopy for which                            NOK ( Clark,Kayla) consented for over the phone                            Colonoscopy tomorrow                           Prep today with 4 liters Golytely starting now                           Keep NPO past midnight except prep                           Clears today                           Repeat labs in the AM ( CBC, CMP and INR) Procedure Code(s):        --- Professional ---                           [phone], Esophagogastroduodenoscopy, flexible,                            transoral; with biopsy, single or multiple Diagnosis Code(s):        --- Professional ---  K92.1, Melena (includes Hematochezia) CPT copyright 2022 American Medical Association. All rights reserved. The codes documented in this report are preliminary and upon coder review may  be revised to meet current compliance requirements. Sanjuan Dame, MD Sanjuan Dame, MD 05/10/2023 2:05:38 PM This report has been signed electronically. Number of Addenda: 0

## 2023-05-10 NOTE — [place] FL2 (Signed)
[place] MEDICAID FL2 LEVEL OF CARE FORM     IDENTIFICATION  Patient Name: [name] Birthdate: 1946/03/09 Sex: female Admission Date (Current Location): 05/08/2023  [facility] and [place] Number:  [employer] and Address:  [facility],  [address] [place] [address]      Provider Number: [id number]  Attending Physician Name and Address:  Cleora Fleet, MD  Relative Name and Phone Number:       Current Level of Care: Hospital Recommended Level of Care: Skilled Nursing Facility Prior Approval Number:    Date Approved/Denied:   PASRR Number:    Discharge Plan: SNF    Current Diagnoses: Patient Active Problem List   Diagnosis Date Noted   Upper GI bleed 05/09/2023   Acute upper GI bleed 05/08/2023   Acquired thrombophilia (HCC) 05/08/2023   Generalized abdominal pain 05/08/2023   Guaiac positive stools 05/08/2023   Symptomatic anemia 05/08/2023   Acute on chronic blood loss anemia 07/17/2017   Diarrhea 07/17/2017   Dysphagia 03/06/2017   Food impaction of esophagus 08/06/2016   Healthcare-associated pneumonia 05/16/2016   Acute respiratory failure with hypoxia (HCC) 05/16/2016   Acute encephalopathy 05/16/2016   Hypothyroidism 05/16/2016   Influenza 05/16/2016   Dementia / Memory deficits 05/16/2016   S/P hip replacement 02/28/2012   Mechanical complication of internal orthopedic implant (HCC) 02/28/2012   Cellulitis of right hip 01/13/2012   Psychosis (HCC) 01/13/2012   Depression 01/13/2012   Gout 01/13/2012   Chronic pain 01/13/2012   Decubitus ulcer of left buttock, stage 4 (HCC) 01/13/2012   IBS (irritable bowel syndrome) 01/13/2012   Hypokalemia 01/13/2012   Anemia, chronic disease 01/13/2012   Herniated disc 12/05/2011   Seroma complicating a procedure 06/05/2011   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) 06/05/2011   Wound infection after surgery 05/22/2011   Fall 04/30/2011    Hip fracture, right (HCC) 04/30/2011   CLOSED FRACTURE OF HEAD OF RADIUS 01/09/2010   Closed dislocation of elbow 01/09/2010    Orientation RESPIRATION BLADDER Height & Weight     Self, Time, Situation, Place  Normal Incontinent Weight: 139 lb 15.9 oz (63.5 kg) Height:  5\' 7"  (170.2 cm)  BEHAVIORAL SYMPTOMS/MOOD NEUROLOGICAL BOWEL NUTRITION STATUS      Incontinent Diet (see dc summary)  AMBULATORY STATUS COMMUNICATION OF NEEDS Skin   Limited Assist Verbally Normal                       Personal Care Assistance Level of Assistance  Bathing, Feeding, Dressing Bathing Assistance: Limited assistance Feeding assistance: Independent Dressing Assistance: Independent     Functional Limitations Info  Sight, Hearing, Speech Sight Info: Adequate Hearing Info: Adequate Speech Info: Adequate    SPECIAL CARE FACTORS FREQUENCY                       Contractures Contractures Info: Not present    Additional Factors Info  Code Status, Allergies Code Status Info: Full Allergies Info: Diphenhydramine, Tape           Current Medications (05/10/2023):  This is the current hospital active medication list Current Facility-Administered Medications  Medication Dose Route Frequency Provider Last Rate Last Admin   acetaminophen (TYLENOL) tablet 650 mg  650 mg Oral Q6H PRN Laural Benes, Clanford L, MD   650 mg at 05/08/23 2020   Or   acetaminophen (TYLENOL) suppository 650 mg  650 mg Rectal Q6H  PRN Laural Benes, Clanford L, MD       ascorbic acid (VITAMIN C) tablet 500 mg  500 mg Oral Daily Johnson, Clanford L, MD   500 mg at 05/10/23 0931   atorvastatin (LIPITOR) tablet 10 mg  10 mg Oral QHS Johnson, Clanford L, MD   10 mg at 05/09/23 2220   bisacodyl (DULCOLAX) EC tablet 5 mg  5 mg Oral Daily PRN Laural Benes, Clanford L, MD       calcitonin (salmon) (MIACALCIN/FORTICAL) nasal spray 1 spray  1 spray Alternating Nares Daily Johnson, Clanford L, MD   1 spray at 05/10/23 0932   cholecalciferol  (VITAMIN D3) 25 MCG (1000 UNIT) tablet 1,000 Units  1,000 Units Oral Daily Laural Benes, Clanford L, MD   1,000 Units at 05/10/23 0932   cyanocobalamin (VITAMIN B12) tablet 1,000 mcg  1,000 mcg Oral Daily Laural Benes, Clanford L, MD   1,000 mcg at 05/10/23 0931   dicyclomine (BENTYL) capsule 20 mg  20 mg Oral QID Johnson, Clanford L, MD   20 mg at 05/10/23 0932   fentaNYL (SUBLIMAZE) injection 12.5 mcg  12.5 mcg Intravenous Q2H PRN Johnson, Clanford L, MD       ferrous sulfate tablet 325 mg  325 mg Oral Q breakfast Laural Benes, Clanford L, MD   325 mg at 05/10/23 0932   levothyroxine (SYNTHROID) tablet 50 mcg  50 mcg Oral QAC breakfast Laural Benes, Clanford L, MD   50 mcg at 05/10/23 0514   LORazepam (ATIVAN) tablet 1 mg  1 mg Oral BID PRN Laural Benes, Clanford L, MD       memantine (NAMENDA) tablet 10 mg  10 mg Oral BID Johnson, Clanford L, MD   10 mg at 05/10/23 0931   OLANZapine (ZYPREXA) tablet 10 mg  10 mg Oral QHS Johnson, Clanford L, MD   10 mg at 05/09/23 2220   ondansetron (ZOFRAN) tablet 4 mg  4 mg Oral Q6H PRN Johnson, Clanford L, MD       Or   ondansetron (ZOFRAN) injection 4 mg  4 mg Intravenous Q6H PRN Johnson, Clanford L, MD       oxyCODONE (Oxy IR/ROXICODONE) immediate release tablet 5 mg  5 mg Oral Q6H PRN Johnson, Clanford L, MD   5 mg at 05/09/23 0434   pantoprazole (PROTONIX) injection 40 mg  40 mg Intravenous Q12H Johnson, Clanford L, MD   40 mg at 05/10/23 0931   venlafaxine XR (EFFEXOR-XR) 24 hr capsule 75 mg  75 mg Oral Daily Johnson, Clanford L, MD   75 mg at 05/09/23 8469     Discharge Medications: Please see discharge summary for a list of discharge medications.  Relevant Imaging Results:  Relevant Lab Results:   Additional Information    Elliot Gault, LCSW

## 2023-05-10 NOTE — Progress Notes (Signed)
PROGRESS NOTE   [name]  ZOX:[id number] DOB: 06-Aug-1945 DOA: 05/08/2023 PCP: Charlynne Pander, MD   Chief Complaint  Patient presents with   Weakness   abnormal labs   Level of care: Telemetry  Brief Admission History:  78 year old female resident of [facility] with past medical history of esophageal dysphagia, anemia, acquired thrombophilia from apixaban due to DVT, gout, GERD, depression, osteoporosis, IBS, hypokalemia, history of psychosis who reportedly has been complaining of abdominal pain for past several days and also having black melanotic stools.  CBC tested at the facility was noted to be 6.4 and she was sent to the emergency department.  Repeat CBC with hemoglobin of 5.7.  Patient tested guaiac positive in the stool.  Patient was started on IV pantoprazole and PRBC transfusion ordered and admission requested for management.  GI consultation requested in the ED and they will consult.    Assessment and Plan:  Acute upper GI bleed  -pt presenting with melanotic guaiac positive stool -Hemoglobin down to 5.7 on admission lab -Patient received apixaban this morning on 05/08/2023 per [facility] sent from [facility] -hold further apixaban -type and cross and transfused 2 units PRBC 2/12, Hg up to 8.4 -GI consultation requested and planning EGD on 2/14 -NPO now for planned procedure    Acute on chronic blood loss anemia Symptomatic anemia - transfused 2 units PRBC ordered in ED on 2/12 - follow up Anemia Panel: Fe 12, ferritin 15, B12 383 - follow CBC  - IV pantoprazole 40 mg BID ordered   GERD - pantoprazole ordered IV for GI protection    Generalized Abdominal Pain - only mild symptoms reported at this time - IV pantoprazole ordered - GI consultation planning EGD on 2/14     Hypothyroidism  - resumed home levothyroxine   Depression / Psychosis - resumed home behavioral health medications     Hypokalemia - repleted  - chronic per records - potassium added to  IV fluids - check magnesium  - follow BMP    Dysphagia - chronic - clear liquid diet for now - advance to dysphagia diet when ok with GI    Acquired Thrombophilia History of DVT  - hold apixaban for now - follow CBC  - SCDs for DVT prevention   DVT prophylaxis: SCDs Code Status: FULL  Family Communication: none present during rounds  Disposition: anticipate return to LTC [facility]   Consultants:  GI Procedures:  EGD tentatively planned for 05/10/2023  Antimicrobials:    Subjective: Pt remains confused but no specific complaints this morning.   Objective: Vitals:   05/09/23 0920 05/09/23 1437 05/09/23 1953 05/10/23 0515  BP: (!) 146/71 (!) 155/59 (!) 143/64 (!) 160/80  Pulse: 76 77 77 76  Resp:   18 18  Temp: 98.8 F (37.1 C) 98.7 F (37.1 C) 99.5 F (37.5 C) 97.7 F (36.5 C)  TempSrc: Oral Oral Oral Oral  SpO2: 100% 96% 96% 95%  Weight:      Height:        Intake/Output Summary (Last 24 hours) at 05/10/2023 1223 Last data filed at 05/10/2023 1146 Gross per 24 hour  Intake --  Output 1400 ml  Net -1400 ml   Filed Weights   05/08/23 1000  Weight: 63.5 kg   Examination:  General exam: Appears awake, alert, cooperative, with dementia, but calm and comfortable  Respiratory system: Clear to auscultation. Respiratory effort normal. Cardiovascular system: normal S1 & S2 heard. No JVD, murmurs, rubs, gallops or clicks.  No pedal edema. Gastrointestinal system: Abdomen is nondistended, soft and nontender. No organomegaly or masses felt. Normal bowel sounds heard. Central nervous system: Alert and oriented to person only. No focal neurological deficits. Extremities: Symmetric 5 x 5 power. Skin: No rashes, lesions or ulcers. Psychiatry: Judgement and insight appear diminished. Mood & affect appropriate.   Data Reviewed: I have personally reviewed following labs and imaging studies  CBC: Recent Labs  Lab 05/08/23 1017 05/09/23 0330 05/10/23 0429  WBC  3.9* 4.2 4.7  NEUTROABS 2.3  --   --   HGB 5.7* 8.4* 8.2*  HCT 20.4* 26.7* 25.8*  MCV 92.7 90.8 89.6  PLT 387 355 355    Basic Metabolic Panel: Recent Labs  Lab 05/08/23 1017 05/09/23 0330 05/10/23 0429  NA 141 143 142  K 3.2* 3.8 3.8  CL 108 111 110  CO2 26 26 25   GLUCOSE 100* 82 90  BUN 9 7* 5*  CREATININE 0.49 0.49 0.53  CALCIUM 8.3* 8.3* 8.5*  MG  --  1.9 1.8    CBG: No results for input(s): "GLUCAP" in the last 168 hours.  No results found for this or any previous visit (from the past 240 hours).   Radiology Studies: No results found.  Scheduled Meds:  ascorbic acid  500 mg Oral Daily   atorvastatin  10 mg Oral QHS   calcitonin (salmon)  1 spray Alternating Nares Daily   cholecalciferol  1,000 Units Oral Daily   cyanocobalamin  1,000 mcg Oral Daily   dicyclomine  20 mg Oral QID   ferrous sulfate  325 mg Oral Q breakfast   levothyroxine  50 mcg Oral QAC breakfast   memantine  10 mg Oral BID   OLANZapine  10 mg Oral QHS   pantoprazole (PROTONIX) IV  40 mg Intravenous Q12H   venlafaxine XR  75 mg Oral Daily   Continuous Infusions:  sodium chloride      LOS: 2 days   Time spent: 52 mins  Bobie Caris Laural Benes, MD How to contact the [facility] Attending or Consulting provider 7A - 7P or covering provider during after hours 7P -7A, for this patient?  Check the care team in [facility] and look for a) attending/consulting TRH provider listed and b) the [facility] team listed Log into [email or web address] to find provider on call.  Locate the [facility] provider you are looking for under [facility] and page to a number that you can be directly reached. If you still have difficulty reaching the provider, please page the [facility] (Director on Call) for the Hospitalists listed on amion for assistance.  05/10/2023, 12:23 PM

## 2023-05-10 NOTE — Transfer of Care (Signed)
Immediate Anesthesia Transfer of Care Note  Patient: [name]  Procedure(s) Performed: ESOPHAGOGASTRODUODENOSCOPY (EGD) WITH PROPOFOL BIOPSY  Patient Location: PACU  Anesthesia Type:General  Level of Consciousness: drowsy and patient cooperative  Airway & Oxygen Therapy: Patient Spontanous Breathing  Post-op Assessment: Report given to RN and Post -op Vital signs reviewed and stable  Post vital signs: Reviewed and stable  Last Vitals:  Vitals Value Taken Time  BP    Temp    Pulse    Resp    SpO2      Last Pain:  Vitals:   05/10/23 1327  TempSrc:   PainSc: 0-No pain         Complications: No notable events documented.

## 2023-05-10 NOTE — Care Management Important Message (Signed)
Important Message  Patient Details  Name: [name] MRN: [id number] Date of Birth: 08-Mar-1946   Important Message Given:  Yes - Medicare IM (letter reviewed with legal guardian [name] at [phone])     Corey Harold 05/10/2023, 11:41 AM

## 2023-05-11 ENCOUNTER — Inpatient Hospital Stay (HOSPITAL_COMMUNITY): Payer: Medicare (Managed Care) | Admitting: Anesthesiology

## 2023-05-11 ENCOUNTER — Encounter (HOSPITAL_COMMUNITY)
Admission: EM | Disposition: A | Discharge status: Skilled Nursing Facility | Payer: Self-pay | Source: Skilled Nursing Facility | Attending: Family Medicine

## 2023-05-11 DIAGNOSIS — R195 Other fecal abnormalities: Secondary | ICD-10-CM | POA: Diagnosis not present

## 2023-05-11 DIAGNOSIS — K648 Other hemorrhoids: Secondary | ICD-10-CM | POA: Diagnosis not present

## 2023-05-11 DIAGNOSIS — K5730 Diverticulosis of large intestine without perforation or abscess without bleeding: Secondary | ICD-10-CM

## 2023-05-11 DIAGNOSIS — D62 Acute posthemorrhagic anemia: Secondary | ICD-10-CM | POA: Diagnosis not present

## 2023-05-11 DIAGNOSIS — D125 Benign neoplasm of sigmoid colon: Secondary | ICD-10-CM | POA: Diagnosis not present

## 2023-05-11 DIAGNOSIS — K644 Residual hemorrhoidal skin tags: Secondary | ICD-10-CM

## 2023-05-11 DIAGNOSIS — D122 Benign neoplasm of ascending colon: Secondary | ICD-10-CM

## 2023-05-11 DIAGNOSIS — K922 Gastrointestinal hemorrhage, unspecified: Secondary | ICD-10-CM | POA: Diagnosis not present

## 2023-05-11 DIAGNOSIS — E039 Hypothyroidism, unspecified: Secondary | ICD-10-CM | POA: Diagnosis not present

## 2023-05-11 HISTORY — PX: COLONOSCOPY WITH PROPOFOL: SHX5780

## 2023-05-11 HISTORY — PX: POLYPECTOMY: SHX5525

## 2023-05-11 LAB — COMPREHENSIVE METABOLIC PANEL
ALT: 8 U/L (ref 0–44)
AST: 13 U/L — ABNORMAL LOW (ref 15–41)
Albumin: 2.8 g/dL — ABNORMAL LOW (ref 3.5–5.0)
Alkaline Phosphatase: 84 U/L (ref 38–126)
Anion gap: 8 (ref 5–15)
BUN: 5 mg/dL — ABNORMAL LOW (ref 8–23)
CO2: 25 mmol/L (ref 22–32)
Calcium: 8.5 mg/dL — ABNORMAL LOW (ref 8.9–10.3)
Chloride: 113 mmol/L — ABNORMAL HIGH (ref 98–111)
Creatinine, Ser: 0.54 mg/dL (ref 0.44–1.00)
GFR, Estimated: >60 mL/min (ref >60)
Glucose, Bld: 96 mg/dL (ref 70–99)
Potassium: 3.2 mmol/L — ABNORMAL LOW (ref 3.5–5.1)
Sodium: 146 mmol/L — ABNORMAL HIGH (ref 135–145)
Total Bilirubin: 0.3 mg/dL (ref 0.0–1.2)
Total Protein: 5.3 g/dL — ABNORMAL LOW (ref 6.5–8.1)

## 2023-05-11 LAB — CBC
HCT: 26.8 % — ABNORMAL LOW (ref 36.0–46.0)
Hemoglobin: 8.3 g/dL — ABNORMAL LOW (ref 12.0–15.0)
MCH: 28 pg (ref 26.0–34.0)
MCHC: 31 g/dL (ref 30.0–36.0)
MCV: 90.5 fL (ref 80.0–100.0)
Platelets: 354 10*3/uL (ref 150–400)
RBC: 2.96 MIL/uL — ABNORMAL LOW (ref 3.87–5.11)
RDW: 17.3 % — ABNORMAL HIGH (ref 11.5–15.5)
WBC: 4.2 10*3/uL (ref 4.0–10.5)
nRBC: 0 % (ref 0.0–0.2)

## 2023-05-11 LAB — PROTIME-INR
INR: 1.1 (ref 0.8–1.2)
Prothrombin Time: 14.3 s (ref 11.4–15.2)

## 2023-05-11 LAB — MAGNESIUM: Magnesium: 1.8 mg/dL (ref 1.7–2.4)

## 2023-05-11 SURGERY — COLONOSCOPY WITH PROPOFOL
Anesthesia: General

## 2023-05-11 MED ORDER — FERROUS SULFATE 325 (65 FE) MG PO TABS
325.0000 mg | ORAL_TABLET | ORAL | Status: DC
  Administered 2023-05-13: 325 mg via ORAL
  Filled 2023-05-11: qty 1

## 2023-05-11 MED ORDER — POTASSIUM CHLORIDE 10 MEQ/100ML IV SOLN
10.0000 meq | INTRAVENOUS | Status: AC
  Administered 2023-05-11 (×4): 10 meq via INTRAVENOUS
  Filled 2023-05-11 (×4): qty 100

## 2023-05-11 MED ORDER — PANTOPRAZOLE SODIUM 40 MG PO TBEC
40.0000 mg | DELAYED_RELEASE_TABLET | Freq: Every day | ORAL | Status: DC
  Administered 2023-05-12 – 2023-05-13 (×2): 40 mg via ORAL
  Filled 2023-05-11 (×2): qty 1

## 2023-05-11 MED ORDER — LIDOCAINE HCL (PF) 2 % IJ SOLN
INTRAMUSCULAR | Status: AC
  Filled 2023-05-11: qty 5

## 2023-05-11 MED ORDER — METOCLOPRAMIDE HCL 5 MG/ML IJ SOLN: 10.0000 mg | Freq: Once | INTRAMUSCULAR | Status: DC | PRN

## 2023-05-11 MED ORDER — MAGNESIUM SULFATE 4 GM/100ML IV SOLN
4.0000 g | Freq: Once | INTRAVENOUS | Status: AC
  Administered 2023-05-11: 4 g via INTRAVENOUS
  Filled 2023-05-11: qty 100

## 2023-05-11 MED ORDER — POLYETHYLENE GLYCOL 3350 17 G PO PACK
17.0000 g | PACK | Freq: Two times a day (BID) | ORAL | Status: DC
  Administered 2023-05-13: 17 g via ORAL
  Filled 2023-05-11: qty 1

## 2023-05-11 MED ORDER — PROPOFOL 500 MG/50ML IV EMUL
INTRAVENOUS | Status: DC | PRN
  Administered 2023-05-11: 125 ug/kg/min via INTRAVENOUS

## 2023-05-11 MED ORDER — SODIUM CHLORIDE 0.9 % IV SOLN: INTRAVENOUS | Status: DC | PRN

## 2023-05-11 MED ORDER — ZINC OXIDE 40 % EX OINT
TOPICAL_OINTMENT | CUTANEOUS | Status: DC | PRN
  Filled 2023-05-11: qty 57

## 2023-05-11 MED ORDER — LIDOCAINE HCL (PF) 2 % IJ SOLN
INTRAMUSCULAR | Status: DC | PRN
  Administered 2023-05-11: 50 mg via INTRADERMAL

## 2023-05-11 MED ORDER — FENTANYL CITRATE (PF) 100 MCG/2ML IJ SOLN: 25.0000 ug | INTRAMUSCULAR | Status: DC | PRN

## 2023-05-11 MED ORDER — DEXTROSE 5 % IV SOLN: INTRAVENOUS | Status: AC

## 2023-05-11 MED ORDER — MEPERIDINE HCL 50 MG/ML IJ SOLN: 6.2500 mg | INTRAMUSCULAR | Status: DC | PRN

## 2023-05-11 MED ORDER — PROPOFOL 500 MG/50ML IV EMUL
INTRAVENOUS | Status: AC
  Filled 2023-05-11: qty 50

## 2023-05-11 MED ORDER — ORAL CARE MOUTH RINSE: 15.0000 mL | OROMUCOSAL | Status: DC | PRN

## 2023-05-11 NOTE — Anesthesia Postprocedure Evaluation (Signed)
Anesthesia Post Note  Patient: [name]  Procedure(s) Performed: COLONOSCOPY WITH PROPOFOL POLYPECTOMY  Patient location during evaluation: PACU Anesthesia Type: General Level of consciousness: awake and alert Pain management: pain level controlled Vital Signs Assessment: post-procedure vital signs reviewed and stable Respiratory status: spontaneous breathing, nonlabored ventilation and respiratory function stable Cardiovascular status: blood pressure returned to baseline and stable Postop Assessment: no apparent nausea or vomiting Anesthetic complications: no  No notable events documented.   Last Vitals:  Vitals:   05/11/23 1046 05/11/23 1100  BP: 114/65 (!) 129/48  Pulse: 69 60  Resp: 18 14  Temp: 36.7 C   SpO2: 99% 96%    Last Pain:  Vitals:   05/11/23 1202  TempSrc:   PainSc: 8                  Roslynn Amble

## 2023-05-11 NOTE — Interval H&P Note (Signed)
History and Physical Interval Note:  05/11/2023 9:59 AM  [name]  has presented today for surgery, with the diagnosis of anemia.  The various methods of treatment have been discussed with the patient and family. After consideration of risks, benefits and other options for treatment, the patient has consented to  Procedure(s): COLONOSCOPY WITH PROPOFOL (N/A) as a surgical intervention.  The patient's history has been reviewed, patient examined, no change in status, stable for surgery.  I have reviewed the patient's chart and labs.  Questions were answered to the patient's satisfaction.    EGD negative to explain source of anemia , POA consented for colonoscopy   [name]

## 2023-05-11 NOTE — Op Note (Signed)
[facility] Patient Name: [name] Procedure Date: 05/11/2023 9:31 AM MRN: [id number] Date of Birth: 18-Mar-1946 Attending MD: Sanjuan Dame , MD, [id number] CSN: [id number] Age: 78 Admit Type: Inpatient Procedure:                Colonoscopy Indications:              Melena, Acute post hemorrhagic anemia Providers:                Sanjuan Dame, MD, Nena Polio, RN, Cyril Mourning, Technician Referring MD:              Medicines:                Monitored Anesthesia Care Complications:            No immediate complications. Estimated Blood Loss:     Estimated blood loss: none. Procedure:                Pre-Anesthesia Assessment:                           - Prior to the procedure, a History and Physical                            was performed, and patient medications and                            allergies were reviewed. The patient's tolerance of                            previous anesthesia was also reviewed. The risks                            and benefits of the procedure and the sedation                            options and risks were discussed with the patient.                            All questions were answered, and informed consent                            was obtained. Prior Anticoagulants: The patient has                            taken Eliquis (apixaban), last dose was 5 days                            prior to procedure. ASA Grade Assessment: III - A                            patient with severe systemic disease. After  reviewing the risks and benefits, the patient was                            deemed in satisfactory condition to undergo the                            procedure.                           After obtaining informed consent, the colonoscope                            was passed under direct vision. Throughout the                            procedure, the patient's blood pressure,  pulse, and                            oxygen saturations were monitored continuously. The                            [id number]) scope was introduced through the                            anus and advanced to the the terminal ileum. The                            colonoscopy was performed without difficulty. The                            patient tolerated the procedure well. The quality                            of the bowel preparation was evaluated using the                            BBPS [facility] Bowel Preparation Scale) with scores                            of: Right Colon = 2 (minor amount of residual                            staining, small fragments of stool and/or opaque                            liquid, but mucosa seen well), Transverse Colon = 3                            (entire mucosa seen well with no residual staining,                            small fragments of stool or opaque liquid) and Left  Colon = 3 (entire mucosa seen well with no residual                            staining, small fragments of stool or opaque                            liquid). The total BBPS score equals 8. The                            terminal ileum, ileocecal valve, appendiceal                            orifice, and rectum were photographed. Scope In: 10:11:35 AM Scope Out: 10:39:24 AM Scope Withdrawal Time: 0 hours 24 minutes 15 seconds  Total Procedure Duration: 0 hours 27 minutes 49 seconds  Findings:      The perianal and digital rectal examinations were normal.      Three sessile polyps were found in the sigmoid colon and ascending       colon. The polyps were 7 to 9 mm in size. These polyps were removed with       a cold snare. Resection and retrieval were complete.      The terminal ileum appeared normal.      A few diverticula were found in the left colon.      Non-bleeding external and internal hemorrhoids were found during        retroflexion. The hemorrhoids were small. Impression:               - Three 7 to 9 mm polyps in the sigmoid colon and                            in the ascending colon, removed with a cold snare.                            Resected and retrieved.                           - The examined portion of the ileum was normal.                           - Diverticulosis in the left colon.                           - Non-bleeding external and internal hemorrhoids.                           - This colonoscopy does not explain Anemia and                            melena as there was no evidence of active or old                            bleeding seen throughout the examined colon and  terminal ileum                           - Most likley cause of Melena from cameron lesion                            in setting of large hiatal hernia ( 6cm) which may                            have healed with PPI therpay inpatient . Small                            bowel pathology still needs to be ruled out Moderate Sedation:      Per Anesthesia Care Recommendation:           - Can restart diet as tolerated                           - No absolute contraindication to restart                            clinically indicated anticoagulation or                            antiplatelet drugs while patient is closely                            monitored . If restarted wouuld recommend after 48                            hours                           - No further inpatient workup pending from GI                            perspective                           - Continue Protonix 40mg  , 30 min before breakfast                           - Repeat CBC and iron profile as outpatient and if                            not improvement than may benefit from small bowel                            capsule endoscopy                           -PO iron q 48 hours with Miralax BID                            - Await pathology results.                           -  No repeat colonoscopy due to current age (17                            years or older).                           - Return to GI clinic at appointment to be                            scheduled. Procedure Code(s):        --- Professional ---                           [phone], Colonoscopy, flexible; with removal of                            tumor(s), polyp(s), or other lesion(s) by snare                            technique Diagnosis Code(s):        --- Professional ---                           D12.5, Benign neoplasm of sigmoid colon                           D12.2, Benign neoplasm of ascending colon                           K64.8, Other hemorrhoids                           K92.1, Melena (includes Hematochezia)                           D62, Acute posthemorrhagic anemia                           K57.30, Diverticulosis of large intestine without                            perforation or abscess without bleeding CPT copyright 2022 American Medical Association. All rights reserved. The codes documented in this report are preliminary and upon coder review may  be revised to meet current compliance requirements. Sanjuan Dame, MD Sanjuan Dame, MD 05/11/2023 10:51:31 AM This report has been signed electronically. Number of Addenda: 0

## 2023-05-11 NOTE — Anesthesia Postprocedure Evaluation (Signed)
Anesthesia Post Note  Patient: [name]  Procedure(s) Performed: COLONOSCOPY WITH PROPOFOL POLYPECTOMY  Patient location during evaluation: PACU Anesthesia Type: General Level of consciousness: awake and alert Pain management: pain level controlled Vital Signs Assessment: post-procedure vital signs reviewed and stable Respiratory status: spontaneous breathing, nonlabored ventilation and respiratory function stable Cardiovascular status: blood pressure returned to baseline and stable Postop Assessment: no apparent nausea or vomiting Anesthetic complications: no   No notable events documented.   Last Vitals:  Vitals:   05/11/23 1046 05/11/23 1100  BP: 114/65 (!) 129/48  Pulse: 69 60  Resp: 18 14  Temp: 36.7 C   SpO2: 99% 96%    Last Pain:  Vitals:   05/11/23 1100  TempSrc:   PainSc: 0-No pain                 Roslynn Amble

## 2023-05-11 NOTE — Progress Notes (Addendum)
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  CBC tested at the facility was noted to be 6.4 and she was sent to the emergency department.  Repeat CBC with hemoglobin of 5.7.  Patient tested guaiac positive in the stool.  Patient was started on IV pantoprazole and PRBC transfusion ordered and admission requested for management.  GI consultation requested in the ED and they will consult.    Assessment and Plan:  Acute upper GI bleed  -pt presenting with melanotic guaiac positive stool -Hemoglobin down to 5.7 on admission lab -Patient received apixaban this morning on 05/08/2023 per [facility] sent from [facility] -hold further apixaban for another 48 hours per GI and then can resume  -type and cross and transfused 2 units PRBC 2/12, Hg up to 8.4 -GI consultation requested and completed EGD on 2/14 and colonoscopy 2/15 -colonoscopy 2/15 with findings of three 7-20mm polyps in sigmoid colon, removed with cold snare, normal ileum, left colon diverticulosis, nonbleeding external and internal hemorrhoids seen. Colonoscopy does not explain anemia but felt most likely cause of melena from cameron lesion in setting of large hiatal hernia (6 cm) which may have healed with inpatient PPI therapy.  Continue pantoprazole 40 mg daily, repeat CBC and iron outpatient and may benefit from small bowel capsule endoscopy; recommended oral iron every 48 hours with BID miralax.     Acute on chronic  blood loss anemia Symptomatic anemia - transfused 2 units PRBC ordered in ED on 2/12 - follow up Anemia Panel: Fe 12, ferritin 15, B12 383 - follow CBC  - IV pantoprazole 40 mg BID ordered, change to pantoprazole 40 mg p.o. daily    GERD - pantoprazole ordered IV for GI protection - update order to oral pantoprazole 40 mg daily    Generalized Abdominal Pain - only mild symptoms reported at this time - IV pantoprazole ordered - GI consultation planning EGD on 2/14     Hypothyroidism  - resumed home levothyroxine   Hypernatremia  - likely from free water loss from colon prep - D5W 1 day ordered - recheck in AM  Depression / Psychosis - resumed home behavioral health medications     Hypokalemia - repleted  - chronic per records - IV replacement ordered and giving additional Mg    Dysphagia - chronic - dys 3 diet ordered    Acquired Thrombophilia History of DVT  - hold apixaban for now - follow CBC  - SCDs for DVT prevention, plan to resume apixaban in 2 days on Mon 2/17    DVT prophylaxis: SCDs, resume apixaban on 2/17  Code Status: FULL  Family Communication: none present during rounds  Disposition: anticipate return to [facility] on 2/17    Consultants:  GI  Procedures:  EGD 05/10/2023 Colonoscopy 2/15  Antimicrobials:    Subjective: Pt tolerated her colon prep well and agreeable to procedure,  no specific complaints today.    Objective: Vitals:   05/11/23 0549 05/11/23 0959 05/11/23 1046 05/11/23 1100  BP: Marland Kitchen)  129/52 (!) 161/76 114/65 (!) 129/48  Pulse: 66 76 69 60  Resp: 18 20 18 14   Temp: 98.7 F (37.1 C) 98.3 F (36.8 C) 98.1 F (36.7 C)   TempSrc: Oral Oral    SpO2: 97% 94% 99% 96%  Weight:      Height:        Intake/Output Summary (Last 24 hours) at 05/11/2023 1158 Last data filed at 05/11/2023 1047 Gross per 24 hour  Intake 600 ml  Output --  Net 600 ml   Filed Weights   05/08/23 1000 05/10/23 1306  Weight: 63.5 kg 63.5 kg    Examination:  General exam: Appears awake, alert, cooperative, with dementia, but calm and comfortable  Respiratory system: Clear to auscultation. Respiratory effort normal. Cardiovascular system: normal S1 & S2 heard. No JVD, murmurs, rubs, gallops or clicks. No pedal edema. Gastrointestinal system: Abdomen is nondistended, soft and nontender. No organomegaly or masses felt. Normal bowel sounds heard. Central nervous system: Alert and oriented to person only. No focal neurological deficits. Extremities: Symmetric 5 x 5 power. Skin: No rashes, lesions or ulcers. Psychiatry: Judgement and insight appear diminished. Mood & affect appropriate.   Data Reviewed: I have personally reviewed following labs and imaging studies  CBC: Recent Labs  Lab 05/08/23 1017 05/09/23 0330 05/10/23 0429 05/11/23 0438  WBC 3.9* 4.2 4.7 4.2  NEUTROABS 2.3  --   --   --   HGB 5.7* 8.4* 8.2* 8.3*  HCT 20.4* 26.7* 25.8* 26.8*  MCV 92.7 90.8 89.6 90.5  PLT 387 355 355 354    Basic Metabolic Panel: Recent Labs  Lab 05/08/23 1017 05/09/23 0330 05/10/23 0429 05/11/23 0433  NA 141 143 142 146*  K 3.2* 3.8 3.8 3.2*  CL 108 111 110 113*  CO2 26 26 25 25   GLUCOSE 100* 82 90 96  BUN 9 7* 5* 5*  CREATININE 0.49 0.49 0.53 0.54  CALCIUM 8.3* 8.3* 8.5* 8.5*  MG  --  1.9 1.8 1.8    CBG: No results for input(s): "GLUCAP" in the last 168 hours.  No results found for this or any previous visit (from the past 240 hours).   Radiology Studies: No results found.  Scheduled Meds:  ascorbic acid  500 mg Oral Daily   atorvastatin  10 mg Oral QHS   calcitonin (salmon)  1 spray Alternating Nares Daily   cholecalciferol  1,000 Units Oral Daily   cyanocobalamin  1,000 mcg Oral Daily   dicyclomine  20 mg Oral QID   ferrous sulfate  325 mg Oral Q breakfast   levothyroxine  50 mcg Oral QAC breakfast   memantine  10 mg Oral BID   OLANZapine  10 mg Oral QHS   pantoprazole (PROTONIX) IV  40 mg Intravenous  Q12H   venlafaxine XR  75 mg Oral Daily   Continuous Infusions:  magnesium sulfate bolus IVPB      LOS: 3 days   Time spent: 55 mins  Kobey Sides Laural Benes, MD How to contact the [facility] Attending or Consulting provider 7A - 7P or covering provider during after hours 7P -7A, for this patient?  Check the care team in [facility] and look for a) attending/consulting TRH provider listed and b) the [facility] team listed Log into [email or web address] to find provider on call.  Locate the [facility] provider you are looking for under [facility] and page to a number that you can be directly reached. If you still have difficulty reaching the provider, please page the  DOC (Director on Call) for the Hospitalists listed on amion for assistance.  05/11/2023, 11:58 AM

## 2023-05-11 NOTE — Anesthesia Preprocedure Evaluation (Signed)
Anesthesia Evaluation    History of Anesthesia Complications (+) PONV and history of anesthetic complications  Airway Mallampati: II  TM Distance: >3 FB Neck ROM: Full    Dental  (+) Edentulous Lower, Edentulous Upper   Pulmonary pneumonia   Pulmonary exam normal breath sounds clear to auscultation       Cardiovascular Normal cardiovascular exam Rhythm:Regular Rate:Normal     Neuro/Psych  Headaches PSYCHIATRIC DISORDERS Anxiety Depression       GI/Hepatic ,GERD  ,,  Endo/Other  Hypothyroidism    Renal/GU      Musculoskeletal   Abdominal   Peds  Hematology  (+) Blood dyscrasia, anemia   Anesthesia Other Findings   Reproductive/Obstetrics                             Anesthesia Physical Anesthesia Plan  ASA: 3  Anesthesia Plan: General   Post-op Pain Management: Minimal or no pain anticipated   Induction: Intravenous  PONV Risk Score and Plan: 1 and Propofol infusion  Airway Management Planned: Simple Face Mask and Nasal Cannula  Additional Equipment:   Intra-op Plan:   Post-operative Plan:   Informed Consent: I have reviewed the patients History and Physical, chart, labs and discussed the procedure including the risks, benefits and alternatives for the proposed anesthesia with the patient or authorized representative who has indicated his/her understanding and acceptance.       Plan Discussed with: CRNA  Anesthesia Plan Comments:        Anesthesia Quick Evaluation

## 2023-05-11 NOTE — Transfer of Care (Signed)
Immediate Anesthesia Transfer of Care Note  Patient: [name]  Procedure(s) Performed: COLONOSCOPY WITH PROPOFOL POLYPECTOMY  Patient Location: PACU  Anesthesia Type:General  Level of Consciousness: awake  Airway & Oxygen Therapy: Patient Spontanous Breathing  Post-op Assessment: Report given to RN  Post vital signs: Reviewed and stable  Last Vitals:  Vitals Value Taken Time  BP 129/48 05/11/23 1100  Temp 36.7 C 05/11/23 1046  Pulse 63 05/11/23 1101  Resp 15 05/11/23 1101  SpO2 98 % 05/11/23 1101  Vitals shown include unfiled device data.  Last Pain:  Vitals:   05/11/23 1202  TempSrc:   PainSc: 8          Complications: No notable events documented.

## 2023-05-11 NOTE — Plan of Care (Signed)

## 2023-05-11 NOTE — Progress Notes (Signed)
Patient underwent Colonoscopy under propofol sedation.  Tolerated the procedure adequately.   FINDINGS:  - Three 7 to 9 mm polyps in the sigmoid colon and in the ascending colon, removed with a cold snare. Resected and retrieved.  - The examined portion of the ileum was normal.  - Diverticulosis in the left colon.  - Non- bleeding external and internal hemorrhoids.   - This colonoscopy does not explain Anemia and melena as there was no evidence of active or old bleeding seen throughout the examined colon and terminal ileum   - Most likley cause of Melena from cameron lesion in setting of large hiatal hernia ( 6cm) which may have healed with PPI therpay inpatient .   Small bowel pathology still needs to be ruled out  RECOMMENDATIONS  - Can restart diet as tolerated   - No absolute contraindication to restart clinically indicated anticoagulation or antiplatelet drugs while patient is closely monitored . If restarted wouuld recommend after 48 hours   - No further inpatient workup pending from GI perspective   - Continue Protonix 40mg  , 30 min before breakfast as outpatient   - Repeat CBC and iron profile as outpatient and if no improvement than may benefit from small bowel capsule endoscopy   - PO iron q 48 hours with Miralax BID   - Await pathology results.   - No repeat colonoscopy due to current age ( 39 years or older) .   - Return to GI clinic at appointment to be scheduled.  Please recall GI with any further questions. GI to sign off   Vista Lawman, MD Gastroenterology and Hepatology [facility]

## 2023-05-11 NOTE — Plan of Care (Signed)
Pt is alert and oriented 1-2. Tolerated bowel prep. Tap water enema for 0600 given. Pt is clear. MD aware pt took approx. 90% of prep only 2 cups left.  Problem: Education: Goal: Knowledge of General Education information will improve Description: Including pain rating scale, medication(s)/side effects and non-pharmacologic comfort measures Outcome: Progressing   Problem: Health Behavior/Discharge Planning: Goal: Ability to manage health-related needs will improve Outcome: Progressing   Problem: Clinical Measurements: Goal: Ability to maintain clinical measurements within normal limits will improve Outcome: Progressing Goal: Will remain free from infection Outcome: Progressing Goal: Diagnostic test results will improve Outcome: Progressing Goal: Respiratory complications will improve Outcome: Progressing Goal: Cardiovascular complication will be avoided Outcome: Progressing   Problem: Activity: Goal: Risk for activity intolerance will decrease Outcome: Progressing   Problem: Nutrition: Goal: Adequate nutrition will be maintained Outcome: Progressing   Problem: Coping: Goal: Level of anxiety will decrease Outcome: Progressing   Problem: Elimination: Goal: Will not experience complications related to bowel motility Outcome: Progressing Goal: Will not experience complications related to urinary retention Outcome: Progressing   Problem: Pain Managment: Goal: General experience of comfort will improve and/or be controlled Outcome: Progressing   Problem: Safety: Goal: Ability to remain free from injury will improve Outcome: Progressing   Problem: Skin Integrity: Goal: Risk for impaired skin integrity will decrease Outcome: Progressing

## 2023-05-12 DIAGNOSIS — K922 Gastrointestinal hemorrhage, unspecified: Secondary | ICD-10-CM | POA: Diagnosis not present

## 2023-05-12 DIAGNOSIS — R1319 Other dysphagia: Secondary | ICD-10-CM | POA: Diagnosis not present

## 2023-05-12 DIAGNOSIS — R413 Other amnesia: Secondary | ICD-10-CM

## 2023-05-12 DIAGNOSIS — D649 Anemia, unspecified: Secondary | ICD-10-CM | POA: Diagnosis not present

## 2023-05-12 LAB — CBC
HCT: 27.8 % — ABNORMAL LOW (ref 36.0–46.0)
Hemoglobin: 8.4 g/dL — ABNORMAL LOW (ref 12.0–15.0)
MCH: 27.4 pg (ref 26.0–34.0)
MCHC: 30.2 g/dL (ref 30.0–36.0)
MCV: 90.6 fL (ref 80.0–100.0)
Platelets: 374 10*3/uL (ref 150–400)
RBC: 3.07 MIL/uL — ABNORMAL LOW (ref 3.87–5.11)
RDW: 17.2 % — ABNORMAL HIGH (ref 11.5–15.5)
WBC: 5.4 10*3/uL (ref 4.0–10.5)
nRBC: 0 % (ref 0.0–0.2)

## 2023-05-12 LAB — MAGNESIUM: Magnesium: 2.5 mg/dL — ABNORMAL HIGH (ref 1.7–2.4)

## 2023-05-12 NOTE — Progress Notes (Signed)
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  CBC tested at the facility was noted to be 6.4 and she was sent to the emergency department.  Repeat CBC with hemoglobin of 5.7.  Patient tested guaiac positive in the stool.  Patient was started on IV pantoprazole and PRBC transfusion ordered and admission requested for management.  GI consultation requested in the ED and they will consult.    Assessment and Plan:  Acute upper GI bleed  -pt presenting with melanotic guaiac positive stool -Hemoglobin down to 5.7 on admission lab -Patient received apixaban this morning on 05/08/2023 per [facility] sent from [facility] -hold further apixaban for another 48 hours per GI and then can resume  -type and cross and transfused 2 units PRBC 2/12, Hg up to 8.4 -GI consultation requested and completed EGD on 2/14 and colonoscopy 2/15 -colonoscopy 2/15 with findings of three 7-47mm polyps in sigmoid colon, removed with cold snare, normal ileum, left colon diverticulosis, nonbleeding external and internal hemorrhoids seen. Colonoscopy does not explain anemia but felt most likely cause of melena from cameron lesion in setting of large hiatal hernia (6 cm) which may have healed with inpatient PPI therapy.  Continue pantoprazole 40 mg daily, repeat CBC and iron outpatient and may benefit from small bowel capsule endoscopy; recommended oral iron every 48 hours with BID miralax.     Acute on chronic  blood loss anemia Symptomatic anemia - transfused 2 units PRBC ordered in ED on 2/12 - follow up Anemia Panel: Fe 12, ferritin 15, B12 383 - follow CBC  - IV pantoprazole 40 mg BID ordered, change to pantoprazole 40 mg p.o. daily    GERD - pantoprazole ordered IV for GI protection - update order to oral pantoprazole 40 mg daily    Generalized Abdominal Pain - only mild symptoms reported at this time - IV pantoprazole ordered - GI consultation s/p EGD on 2/14     Hypothyroidism  - resumed home levothyroxine   Hypernatremia  - likely from free water loss from colon prep - D5W 1 day ordered - recheck in AM  Depression / Psychosis - resumed home behavioral health medications     Hypokalemia - repleted  - chronic per records - IV replacement ordered and giving additional Mg    Dysphagia - chronic - dys 3 diet ordered    Acquired Thrombophilia History of DVT  - held apixaban temporarily - follow CBC and hg stable at 8.4  - SCDs for DVT prevention, plan to resume apixaban Mon 2/17 per GI recs    DVT prophylaxis: SCDs, resume apixaban on 2/17  Code Status: FULL  Family Communication: none present during rounds  Disposition: anticipate return to [facility] on 2/17    Consultants:  GI  Procedures:  EGD 05/10/2023 Colonoscopy 2/15  Antimicrobials:    Subjective: Pt tolerated her colon prep well and agreeable to procedure,  no specific complaints today.    Objective: Vitals:   05/11/23 1046 05/11/23 1100 05/11/23 2112 05/12/23 0551  BP: 114/65 (!) 129/48 (!) 132/57 106/70  Pulse: 69 60 (!) 59 67  Resp: 18 14 16 16   Temp: 98.1 F (36.7 C)  98.4 F (36.9 C) 97.7 F (36.5 C)  TempSrc:   Oral Oral  SpO2: 99% 96% 96% 95%  Weight:      Height:        Intake/Output Summary (Last 24 hours) at 05/12/2023 1301 Last data filed at 05/12/2023 0900 Gross per 24 hour  Intake 1062.76 ml  Output 1600 ml  Net -537.24 ml   Filed Weights   05/08/23 1000 05/10/23  1306  Weight: 63.5 kg 63.5 kg   Examination:  General exam: Appears awake, alert, cooperative, with dementia, but calm and comfortable  Respiratory system: Clear to auscultation. Respiratory effort normal. Cardiovascular system: normal S1 & S2 heard. No JVD, murmurs, rubs, gallops or clicks. No pedal edema. Gastrointestinal system: Abdomen is nondistended, soft and nontender. No organomegaly or masses felt. Normal bowel sounds heard. Central nervous system: Alert and oriented to person only. No focal neurological deficits. Extremities: Symmetric 5 x 5 power. Skin: No rashes, lesions or ulcers. Psychiatry: Judgement and insight appear diminished. Mood & affect appropriate.   Data Reviewed: I have personally reviewed following labs and imaging studies  CBC: Recent Labs  Lab 05/08/23 1017 05/09/23 0330 05/10/23 0429 05/11/23 0438 05/12/23 0531  WBC 3.9* 4.2 4.7 4.2 5.4  NEUTROABS 2.3  --   --   --   --   HGB 5.7* 8.4* 8.2* 8.3* 8.4*  HCT 20.4* 26.7* 25.8* 26.8* 27.8*  MCV 92.7 90.8 89.6 90.5 90.6  PLT 387 355 355 354 374    Basic Metabolic Panel: Recent Labs  Lab 05/08/23 1017 05/09/23 0330 05/10/23 0429 05/11/23 0433 05/12/23 0531  NA 141 143 142 146*  --   K 3.2* 3.8 3.8 3.2*  --   CL 108 111 110 113*  --   CO2 26 26 25 25   --   GLUCOSE 100* 82 90 96  --   BUN 9 7* 5* 5*  --   CREATININE 0.49 0.49 0.53 0.54  --   CALCIUM 8.3* 8.3* 8.5* 8.5*  --   MG  --  1.9 1.8 1.8 2.5*    CBG: No results for input(s): "GLUCAP" in the last 168 hours.  No results found for this or any previous visit (from the past 240 hours).   Radiology Studies: No results found.  Scheduled Meds:  ascorbic acid  500 mg Oral Daily   atorvastatin  10 mg Oral QHS   calcitonin (salmon)  1 spray Alternating Nares Daily   cholecalciferol  1,000 Units Oral Daily   cyanocobalamin  1,000 mcg Oral Daily   dicyclomine  20 mg Oral QID   [START ON 05/13/2023] ferrous sulfate  325 mg Oral QODAY    levothyroxine  50 mcg Oral QAC breakfast   memantine  10 mg Oral BID   OLANZapine  10 mg Oral QHS   pantoprazole  40 mg Oral Q0600   [START ON 05/13/2023] polyethylene glycol  17 g Oral BID   venlafaxine XR  75 mg Oral Daily   Continuous Infusions:   LOS: 4 days   Time spent: 56 mins  Armour Villanueva Laural Benes, MD How to contact the [facility] Attending or Consulting provider 7A - 7P or covering provider during after hours 7P -7A, for this patient?  Check the care team in [facility] and look for a) attending/consulting TRH provider listed and b) the TRH  team listed Log into [email or web address] to find provider on call.  Locate the [facility] provider you are looking for under [facility] and page to a number that you can be directly reached. If you still have difficulty reaching the provider, please page the [facility] (Director on Call) for the Hospitalists listed on amion for assistance.  05/12/2023, 1:01 PM

## 2023-05-12 NOTE — Plan of Care (Signed)

## 2023-05-13 ENCOUNTER — Encounter (HOSPITAL_COMMUNITY): Payer: Self-pay | Admitting: Gastroenterology

## 2023-05-13 DIAGNOSIS — D62 Acute posthemorrhagic anemia: Secondary | ICD-10-CM | POA: Diagnosis not present

## 2023-05-13 DIAGNOSIS — R195 Other fecal abnormalities: Secondary | ICD-10-CM | POA: Diagnosis not present

## 2023-05-13 DIAGNOSIS — K922 Gastrointestinal hemorrhage, unspecified: Secondary | ICD-10-CM | POA: Diagnosis not present

## 2023-05-13 DIAGNOSIS — E039 Hypothyroidism, unspecified: Secondary | ICD-10-CM | POA: Diagnosis not present

## 2023-05-13 LAB — SURGICAL PATHOLOGY

## 2023-05-13 MED ORDER — VITAMIN C 500 MG PO TABS: 500.0000 mg | ORAL_TABLET | Freq: Every day | ORAL | Status: DC

## 2023-05-13 MED ORDER — LORAZEPAM 0.5 MG PO TABS: 1.0000 mg | ORAL_TABLET | Freq: Two times a day (BID) | ORAL | 0 refills | Status: AC | PRN

## 2023-05-13 MED ORDER — SENNOSIDES-DOCUSATE SODIUM 8.6-50 MG PO TABS: 2.0000 | ORAL_TABLET | Freq: Every day | ORAL | Status: DC

## 2023-05-13 MED ORDER — VITAMIN D3 25 MCG PO TABS: 1000.0000 [IU] | ORAL_TABLET | Freq: Every day | ORAL | Status: AC

## 2023-05-13 MED ORDER — PREGABALIN 50 MG PO CAPS: 50.0000 mg | ORAL_CAPSULE | Freq: Every day | ORAL | Status: DC | PRN

## 2023-05-13 MED ORDER — HYDROCODONE-ACETAMINOPHEN 5-325 MG PO TABS: 0.5000 | ORAL_TABLET | Freq: Four times a day (QID) | ORAL | 0 refills | Status: AC | PRN

## 2023-05-13 MED ORDER — FERROUS SULFATE 325 (65 FE) MG PO TABS: 325.0000 mg | ORAL_TABLET | ORAL | Status: DC

## 2023-05-13 NOTE — Plan of Care (Signed)
  Problem: Education: Goal: Knowledge of General Education information will improve Description: Including pain rating scale, medication(s)/side effects and non-pharmacologic comfort measures 05/13/2023 1126 by Karolee Ohs, RN Outcome: Adequate for Discharge 05/13/2023 1126 by Karolee Ohs, RN Outcome: Progressing   Problem: Health Behavior/Discharge Planning: Goal: Ability to manage health-related needs will improve 05/13/2023 1126 by Karolee Ohs, RN Outcome: Adequate for Discharge 05/13/2023 1126 by Karolee Ohs, RN Outcome: Progressing   Problem: Clinical Measurements: Goal: Ability to maintain clinical measurements within normal limits will improve 05/13/2023 1126 by Karolee Ohs, RN Outcome: Adequate for Discharge 05/13/2023 1126 by Karolee Ohs, RN Outcome: Progressing Goal: Will remain free from infection Outcome: Adequate for Discharge Goal: Diagnostic test results will improve Outcome: Adequate for Discharge Goal: Respiratory complications will improve Outcome: Adequate for Discharge Goal: Cardiovascular complication will be avoided Outcome: Adequate for Discharge   Problem: Activity: Goal: Risk for activity intolerance will decrease Outcome: Adequate for Discharge   Problem: Nutrition: Goal: Adequate nutrition will be maintained Outcome: Adequate for Discharge   Problem: Coping: Goal: Level of anxiety will decrease Outcome: Adequate for Discharge   Problem: Elimination: Goal: Will not experience complications related to bowel motility Outcome: Adequate for Discharge Goal: Will not experience complications related to urinary retention Outcome: Adequate for Discharge   Problem: Pain Managment: Goal: General experience of comfort will improve and/or be controlled Outcome: Adequate for Discharge   Problem: Safety: Goal: Ability to remain free from injury will improve Outcome: Adequate for Discharge   Problem: Skin Integrity: Goal: Risk for  impaired skin integrity will decrease Outcome: Adequate for Discharge

## 2023-05-13 NOTE — Plan of Care (Signed)

## 2023-05-13 NOTE — Discharge Instructions (Signed)
IMPORTANT INFORMATION: PAY CLOSE ATTENTION   PHYSICIAN DISCHARGE INSTRUCTIONS  Follow with Primary care provider  Caprice Renshaw, MD  and other consultants as instructed by your Hospitalist Physician  [place] IF SYMPTOMS COME BACK, WORSEN OR NEW PROBLEM DEVELOPS   Please note: You were cared for by a hospitalist during your hospital stay. Every effort will be made to forward records to your primary care provider.  You can request that your primary care provider send for your hospital records if they have not received them.  Once you are discharged, your primary care physician will handle any further medical issues. Please note that NO REFILLS for any discharge medications will be authorized once you are discharged, as it is imperative that you return to your primary care physician (or establish a relationship with a primary care physician if you do not have one) for your post hospital discharge needs so that they can reassess your need for medications and monitor your lab values.  Please get a complete blood count and chemistry panel checked by your Primary MD at your next visit, and again as instructed by your Primary MD.  Get Medicines reviewed and adjusted: Please take all your medications with you for your next visit with your Primary MD  Laboratory/radiological data: Please request your Primary MD to go over all hospital tests and procedure/radiological results at the follow up, please ask your primary care provider to get all Hospital records sent to his/her office.  In some cases, they will be blood work, cultures and biopsy results pending at the time of your discharge. Please request that your primary care provider follow up on these results.  If you are diabetic, please bring your blood sugar readings with you to your follow up appointment with primary care.    Please call and make your follow up appointments as soon as possible.    Also Note the  following: If you experience worsening of your admission symptoms, develop shortness of breath, life threatening emergency, suicidal or homicidal thoughts you must seek medical attention immediately by calling 911 or calling your MD immediately  if symptoms less severe.  You must read complete instructions/literature along with all the possible adverse reactions/side effects for all the Medicines you take and that have been prescribed to you. Take any new Medicines after you have completely understood and accpet all the possible adverse reactions/side effects.   Do not drive when taking Pain medications or sleeping medications (Benzodiazepines)  Do not take more than prescribed Pain, Sleep and Anxiety Medications. It is not advisable to combine anxiety,sleep and pain medications without talking with your primary care practitioner  Special Instructions: If you have smoked or chewed Tobacco  in the last 2 yrs please stop smoking, stop any regular Alcohol  and or any Recreational drug use.  Wear Seat belts while driving.  Do not drive if taking any narcotic, mind altering or controlled substances or recreational drugs or alcohol.

## 2023-05-13 NOTE — Plan of Care (Signed)
   Problem: Activity: Goal: Risk for activity intolerance will decrease Outcome: Progressing   Problem: Coping: Goal: Level of anxiety will decrease Outcome: Progressing

## 2023-05-13 NOTE — Discharge Summary (Signed)
Physician Discharge Summary  [name] GEX:[id number] DOB: March 21, 1946 DOA: 05/08/2023  PCP: Charlynne Pander, MD  Admit date: 05/08/2023 Discharge date: 05/13/2023  Admitted From:  [facility]  Disposition: [facility]   Recommendations for Outpatient Follow-up:  Follow up with [facility] in 2 weeks Please obtain CBC in 1 week to follow up hemoglobin on apixaban Please check BMP in 1 week   Home Health:  SNF   Discharge Condition: STABLE   CODE STATUS: FULL DIET:  Heart Healthy  Brief Hospitalization Summary: Please see all hospital notes, images, labs for full details of the hospitalization. 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  CBC tested at the facility was noted to be 6.4 and she was sent to the emergency department.  Repeat CBC with hemoglobin of 5.7.  Patient tested guaiac positive in the stool.  Patient was started on IV pantoprazole and PRBC transfusion ordered and admission requested for management.  GI consultation requested in the ED and they will consult.    HOSPITAL COURSE BY PROBLEM LIST   Acute upper GI bleed  -pt presented with melanotic guaiac positive stool -Hemoglobin down to 5.7 on admission lab -Patient last received apixaban on 05/08/2023 per [facility] sent from [facility] -held further apixaban per GI and then can resume on 2/17 -typed and crossed and transfused 2 units PRBC 2/12, Hg up to 8.4 and stable at 8.4 on DC -GI consultation requested and completed EGD on 2/14 and colonoscopy 2/15 -colonoscopy 2/15 with findings of three 7-61mm polyps in sigmoid colon, removed with cold snare, normal ileum, left colon diverticulosis,  nonbleeding external and internal hemorrhoids seen. Colonoscopy does not explain anemia but felt most likely cause of melena from cameron lesion in setting of large hiatal hernia (6 cm) which may have healed with inpatient PPI therapy.  Continue pantoprazole 40 mg daily, repeat CBC and iron outpatient and may benefit from small bowel capsule endoscopy; recommended oral iron every 48 hours with BID miralax.  [facility] arranging for outpatient follow up.    EGD 2/14: - Normal esophagus.  - 6 cm hiatal hernia. - Erythematous mucosa in the stomach. Biopsied.  - Normal duodenal bulb and second portion of the duodenum.  - Duodenal diverticulum Per GI: This EGD does not explain the cause of severe anemia. Perhaps patient had cameron lesions in setting of large hiatal hernia which have healed with PPI , there was some erythema near the GE junction   Acute on chronic blood loss anemia Symptomatic anemia - transfused 2 units PRBC ordered in ED on 2/12 - follow up Anemia Panel: Fe 12, ferritin 15, B12 383 - follow CBC --- Hg stable at 8.4.  Recommend rechecking CBC in 1 week.  - IV pantoprazole 40 mg BID ordered, changed to pantoprazole 40 mg p.o. 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 - IV replacement ordered and giving additional Mg    Dysphagia - chronic - dys 3 diet ordered    Acquired Thrombophilia History of DVT   - held apixaban temporarily - follow CBC and hg stable at 8.4  - SCDs for DVT prevention, plan to resume apixaban Mon 2/17 per GI recs    Discharge Diagnoses:  Principal Problem:   Acute upper GI bleed Active Problems:   Acute on chronic blood loss anemia   Generalized abdominal pain   Guaiac positive stools   Symptomatic anemia   Psychosis (HCC)   Depression   Chronic pain   Decubitus ulcer of left buttock, stage 4 (HCC)   IBS (irritable bowel syndrome)   Hypokalemia   S/P hip replacement   Hypothyroidism   Dementia / Memory deficits   Dysphagia   Acquired thrombophilia (HCC)   Upper GI bleed   Gastritis and gastroduodenitis   Adenomatous polyp of sigmoid colon   Discharge Instructions:  Allergies as of 05/13/2023       Reactions   Diphenhydramine Other (See Comments)   Feels bad   Tape Dermatitis        Medication List     STOP taking these medications    aspirin EC 81 MG tablet   famotidine 20 MG tablet Commonly known as: PEPCID   ketoconazole 2 % cream Commonly known as: NIZORAL   nystatin powder Commonly known as: MYCOSTATIN/NYSTOP   omeprazole 20 MG capsule Commonly known as: PRILOSEC   oseltamivir 75 MG capsule Commonly known as: TAMIFLU   Probiotic Caps   QUEtiapine 50 MG tablet Commonly known as: SEROQUEL   traZODone 100 MG tablet Commonly known as: DESYREL       TAKE these medications    acetaminophen 325 MG tablet Commonly known as: TYLENOL Take 650 mg by mouth every 6 (six) hours as needed for fever.   Aimovig 140 MG/ML Soaj Generic drug: Erenumab-aooe Inject 140 mg into the skin every 30 (thirty) days.   ascorbic acid 500 MG tablet Commonly known as: VITAMIN C Take 1 tablet (500 mg total) by mouth daily. What changed: when to take this   atorvastatin 10 MG tablet Commonly known as: LIPITOR Take 10 mg by mouth at bedtime.   busPIRone 5 MG tablet Commonly known as: BUSPAR Take by mouth.   calcitonin (salmon)  200 UNIT/ACT nasal spray Commonly known as: MIACALCIN/FORTICAL Place 1 spray into alternate nostrils daily.   cyanocobalamin 1000 MCG tablet Take 1,000 mcg by mouth daily.   dicyclomine 20 MG tablet Commonly known as: BENTYL Take 20 mg by mouth 4 (four) times daily.   Eliquis 5 MG Tabs tablet Generic drug: apixaban Take 5 mg by mouth 2 (two) times daily.   ferrous sulfate 325 (65 FE) MG tablet Take 1 tablet (325 mg total) by mouth every other day. Start taking on: May 14, 2023 What changed: when to take this   HYDROcodone-acetaminophen 5-325 MG tablet Commonly known as: NORCO/VICODIN Take 0.5 tablets by mouth every 6 (six) hours as needed for up to 3 days for severe pain (pain score 7-10). What changed:  how much to take when to take this reasons to take this   levothyroxine 50 MCG tablet  Commonly known as: SYNTHROID Take 50 mcg by mouth daily before breakfast.   LORazepam 0.5 MG tablet Commonly known as: ATIVAN Take 2 tablets (1 mg total) by mouth 2 (two) times daily as needed for anxiety. What changed:  when to take this reasons to take this   memantine 10 MG tablet Commonly known as: NAMENDA Take 10 mg by mouth 2 (two) times daily.   methenamine 1 g tablet Commonly known as: HIPREX Take 1 g by mouth 2 (two) times daily.   OLANZapine 10 MG tablet Commonly known as: ZYPREXA Take 10 mg by mouth at bedtime.   pantoprazole 40 MG tablet Commonly known as: PROTONIX Take 40 mg by mouth daily.   pregabalin 50 MG capsule Commonly known as: LYRICA Take 1 capsule (50 mg total) by mouth daily as needed (neuropathy pain only). What changed:  how much to take how to take this when to take this reasons to take this additional instructions   senna-docusate 8.6-50 MG tablet Commonly known as: Senokot-S Take 2 tablets by mouth at bedtime. What changed: when to take this   venlafaxine XR 75 MG 24 hr capsule Commonly known as: EFFEXOR-XR Take 75 mg by mouth  daily.   vitamin D3 25 MCG tablet Commonly known as: CHOLECALCIFEROL Take 1 tablet (1,000 Units total) by mouth daily. Start taking on: May 14, 2023 What changed:  medication strength how much to take when to take this        Follow-up Information     [facility] at [facility]. Schedule an appointment as soon as possible for a visit in 2 week(s).   Specialty: Gastroenterology Why: Hospital Follow Up Contact information: [address] [place] [address] [phone]               Allergies  Allergen Reactions   Diphenhydramine Other (See Comments)    Feels bad   Tape Dermatitis   Allergies as of 05/13/2023       Reactions   Diphenhydramine Other (See Comments)   Feels bad   Tape Dermatitis        Medication List     STOP taking these medications    aspirin EC 81 MG tablet   famotidine 20 MG tablet Commonly known as: PEPCID   ketoconazole 2 % cream Commonly known as: NIZORAL   nystatin powder Commonly known as: MYCOSTATIN/NYSTOP   omeprazole 20 MG capsule Commonly known as: PRILOSEC   oseltamivir 75 MG capsule Commonly known as: TAMIFLU   Probiotic Caps   QUEtiapine 50 MG tablet Commonly known as: SEROQUEL   traZODone 100 MG tablet Commonly known as: DESYREL       TAKE these medications    acetaminophen 325 MG tablet Commonly known as: TYLENOL Take 650 mg by mouth every 6 (six) hours as needed for fever.   Aimovig 140 MG/ML Soaj Generic drug: Erenumab-aooe Inject 140 mg into the skin every 30 (thirty) days.   ascorbic acid 500 MG tablet Commonly known as: VITAMIN C Take 1 tablet (500 mg total) by mouth daily. What changed: when to take this   atorvastatin 10 MG tablet Commonly known as: LIPITOR Take 10 mg by mouth at bedtime.   busPIRone 5 MG tablet Commonly known as: BUSPAR Take by mouth.   calcitonin (salmon) 200 UNIT/ACT nasal spray Commonly known as:  MIACALCIN/FORTICAL Place 1 spray into alternate nostrils daily.   cyanocobalamin 1000 MCG tablet Take 1,000 mcg by mouth daily.   dicyclomine 20  MG tablet Commonly known as: BENTYL Take 20 mg by mouth 4 (four) times daily.   Eliquis 5 MG Tabs tablet Generic drug: apixaban Take 5 mg by mouth 2 (two) times daily.   ferrous sulfate 325 (65 FE) MG tablet Take 1 tablet (325 mg total) by mouth every other day. Start taking on: May 14, 2023 What changed: when to take this   HYDROcodone-acetaminophen 5-325 MG tablet Commonly known as: NORCO/VICODIN Take 0.5 tablets by mouth every 6 (six) hours as needed for up to 3 days for severe pain (pain score 7-10). What changed:  how much to take when to take this reasons to take this   levothyroxine 50 MCG tablet Commonly known as: SYNTHROID Take 50 mcg by mouth daily before breakfast.   LORazepam 0.5 MG tablet Commonly known as: ATIVAN Take 2 tablets (1 mg total) by mouth 2 (two) times daily as needed for anxiety. What changed:  when to take this reasons to take this   memantine 10 MG tablet Commonly known as: NAMENDA Take 10 mg by mouth 2 (two) times daily.   methenamine 1 g tablet Commonly known as: HIPREX Take 1 g by mouth 2 (two) times daily.   OLANZapine 10 MG tablet Commonly known as: ZYPREXA Take 10 mg by mouth at bedtime.   pantoprazole 40 MG tablet Commonly known as: PROTONIX Take 40 mg by mouth daily.   pregabalin 50 MG capsule Commonly known as: LYRICA Take 1 capsule (50 mg total) by mouth daily as needed (neuropathy pain only). What changed:  how much to take how to take this when to take this reasons to take this additional instructions   senna-docusate 8.6-50 MG tablet Commonly known as: Senokot-S Take 2 tablets by mouth at bedtime. What changed: when to take this   venlafaxine XR 75 MG 24 hr capsule Commonly known as: EFFEXOR-XR Take 75 mg by mouth daily.   vitamin D3 25 MCG tablet Commonly  known as: CHOLECALCIFEROL Take 1 tablet (1,000 Units total) by mouth daily. Start taking on: May 14, 2023 What changed:  medication strength how much to take when to take this        Procedures/Studies: No results found.   Subjective: Pt reports no specific complaints.   Discharge Exam: Vitals:   05/13/23 0446 05/13/23 0800  BP: (!) 107/57 (!) 107/51  Pulse: 88 78  Resp: 16 17  Temp: (!) 97.4 F (36.3 C) 98.1 F (36.7 C)  SpO2: 95% 95%   Vitals:   05/12/23 1412 05/12/23 2048 05/13/23 0446 05/13/23 0800  BP: (!) 115/55 (!) 115/57 (!) 107/57 (!) 107/51  Pulse: 64 (!) 59 88 78  Resp:  18 16 17   Temp: 97.8 F (36.6 C) 97.6 F (36.4 C) (!) 97.4 F (36.3 C) 98.1 F (36.7 C)  TempSrc: Oral Oral Oral Oral  SpO2: 96% 100% 95% 95%  Weight:      Height:       General: Pt is alert, awake, not in acute distress Cardiovascular: normal S1/S2 +, no rubs, no gallops Respiratory: CTA bilaterally, no wheezing, no rhonchi Abdominal: Soft, NT, ND, bowel sounds + Extremities: no edema, no cyanosis   The results of significant diagnostics from this hospitalization (including imaging, microbiology, ancillary and laboratory) are listed below for reference.     Microbiology: No results found for this or any previous visit (from the past 240 hours).   Labs: BNP (last 3 results) No results for input(s): "BNP" in the last 8760 hours. Basic  Metabolic Panel: Recent Labs  Lab 05/08/23 1017 05/09/23 0330 05/10/23 0429 05/11/23 0433 05/12/23 0531  NA 141 143 142 146*  --   K 3.2* 3.8 3.8 3.2*  --   CL 108 111 110 113*  --   CO2 26 26 25 25   --   GLUCOSE 100* 82 90 96  --   BUN 9 7* 5* 5*  --   CREATININE 0.49 0.49 0.53 0.54  --   CALCIUM 8.3* 8.3* 8.5* 8.5*  --   MG  --  1.9 1.8 1.8 2.5*   Liver Function Tests: Recent Labs  Lab 05/08/23 1017 05/11/23 0433  AST 13* 13*  ALT 9 8  ALKPHOS 82 84  BILITOT 0.4 0.3  PROT 6.1* 5.3*  ALBUMIN 3.0* 2.8*   Recent Labs   Lab 05/08/23 1017  LIPASE 29   No results for input(s): "AMMONIA" in the last 168 hours. CBC: Recent Labs  Lab 05/08/23 1017 05/09/23 0330 05/10/23 0429 05/11/23 0438 05/12/23 0531  WBC 3.9* 4.2 4.7 4.2 5.4  NEUTROABS 2.3  --   --   --   --   HGB 5.7* 8.4* 8.2* 8.3* 8.4*  HCT 20.4* 26.7* 25.8* 26.8* 27.8*  MCV 92.7 90.8 89.6 90.5 90.6  PLT 387 355 355 354 374   Cardiac Enzymes: No results for input(s): "CKTOTAL", "CKMB", "CKMBINDEX", "TROPONINI" in the last 168 hours. BNP: Invalid input(s): "POCBNP" CBG: No results for input(s): "GLUCAP" in the last 168 hours. D-Dimer No results for input(s): "DDIMER" in the last 72 hours. Hgb A1c No results for input(s): "HGBA1C" in the last 72 hours. Lipid Profile No results for input(s): "CHOL", "HDL", "LDLCALC", "TRIG", "CHOLHDL", "LDLDIRECT" in the last 72 hours. Thyroid function studies No results for input(s): "TSH", "T4TOTAL", "T3FREE", "THYROIDAB" in the last 72 hours.  Invalid input(s): "FREET3" Anemia work up No results for input(s): "VITAMINB12", "FOLATE", "FERRITIN", "TIBC", "IRON", "RETICCTPCT" in the last 72 hours. Urinalysis    Component Value Date/Time   COLORURINE YELLOW 04/23/2017 1752   APPEARANCEUR CLEAR 04/23/2017 1752   LABSPEC 1.010 04/23/2017 1752   PHURINE 6.5 04/23/2017 1752   GLUCOSEU NEGATIVE 04/23/2017 1752   HGBUR NEGATIVE 04/23/2017 1752   BILIRUBINUR NEGATIVE 04/23/2017 1752   KETONESUR NEGATIVE 04/23/2017 1752   PROTEINUR NEGATIVE 04/23/2017 1752   UROBILINOGEN 0.2 03/28/2013 1435   NITRITE NEGATIVE 04/23/2017 1752   LEUKOCYTESUR NEGATIVE 04/23/2017 1752   Sepsis Labs Recent Labs  Lab 05/09/23 0330 05/10/23 0429 05/11/23 0438 05/12/23 0531  WBC 4.2 4.7 4.2 5.4   Microbiology No results found for this or any previous visit (from the past 240 hours).  Time coordinating discharge:  41 mins   SIGNED:  Standley Dakins, MD  [facility] 05/13/2023, 10:05 AM How to contact the  [facility] Attending or Consulting provider 7A - 7P or covering provider during after hours 7P -7A, for this patient?  Check the care team in [facility] and look for a) attending/consulting TRH provider listed and b) the [facility] team listed Log into [email or web address] and use [facility]'s universal password to access. If you do not have the password, please contact the hospital operator. Locate the [facility] provider you are looking for under [facility] and page to a number that you can be directly reached. If you still have difficulty reaching the provider, please page the [facility] (Director on Call) for the Hospitalists listed on amion for assistance.

## 2023-05-13 NOTE — TOC Transition Note (Signed)
Transition of Care [facility]) - Discharge Note   Patient Details  Name: [name] MRN: [id number] Date of Birth: [birth year]  [facility] of [facility] CM/SW Contact:  Leitha Bleak, RN Phone Number: 05/13/2023, 11:03 AM   Clinical Narrative:   Patient discharging back to [facility]. RN calling report. Clinicals sent in the hub. DC summary emailed to Legal Guardian as requested.    Final next level of care: Long Term Nursing Home Barriers to Discharge: Barriers Resolved   Patient Goals and CMS Choice Patient states their goals for this hospitalization and ongoing recovery are:: return to LTC CMS Medicare.gov Compare Post Acute Care list provided to:: Patient Represenative (must comment) Choice offered to / list presented to : [facility] POA / Guardian     Discharge Placement               Patient to be transferred to facility by: EMS Name of family member notified: [name] Patient and family notified of of transfer: 05/13/23  Discharge Plan and Services Additional resources added to the After Visit Summary for        Social Drivers of Health (SDOH) Interventions SDOH Screenings   Food Insecurity: No Food Insecurity (05/08/2023)  Housing: Low Risk  (05/10/2023)  Transportation Needs: No Transportation Needs (05/08/2023)  Utilities: Not At Risk (05/08/2023)  Social Connections: Patient Unable To Answer (05/09/2023)  Tobacco Use: Low Risk  (05/10/2023)    Readmission Risk Interventions    05/09/2023    9:50 AM  Readmission Risk Prevention Plan  Transportation Screening Complete  PCP or Specialist Appt within 5-7 Days Not Complete  Home Care Screening Complete  Medication Review (RN CM) Complete

## 2023-05-13 NOTE — Progress Notes (Signed)
Patient was picked up for transport to [facility] by [employer]. Report was called.

## 2023-05-14 LAB — SURGICAL PATHOLOGY

## 2023-05-22 ENCOUNTER — Encounter (INDEPENDENT_AMBULATORY_CARE_PROVIDER_SITE_OTHER): Payer: Self-pay | Admitting: *Deleted

## 2023-06-03 ENCOUNTER — Inpatient Hospital Stay: Payer: Medicare (Managed Care) | Attending: Oncology | Admitting: Oncology

## 2023-06-03 ENCOUNTER — Inpatient Hospital Stay: Payer: Medicare (Managed Care)

## 2023-06-03 VITALS — BP 119/65 | HR 65 | Temp 98.3°F | Resp 19

## 2023-06-03 DIAGNOSIS — K449 Diaphragmatic hernia without obstruction or gangrene: Secondary | ICD-10-CM | POA: Diagnosis not present

## 2023-06-03 DIAGNOSIS — Z87891 Personal history of nicotine dependence: Secondary | ICD-10-CM | POA: Diagnosis not present

## 2023-06-03 DIAGNOSIS — K259 Gastric ulcer, unspecified as acute or chronic, without hemorrhage or perforation: Secondary | ICD-10-CM | POA: Insufficient documentation

## 2023-06-03 DIAGNOSIS — D509 Iron deficiency anemia, unspecified: Secondary | ICD-10-CM | POA: Insufficient documentation

## 2023-06-03 DIAGNOSIS — D500 Iron deficiency anemia secondary to blood loss (chronic): Secondary | ICD-10-CM | POA: Insufficient documentation

## 2023-06-03 NOTE — Assessment & Plan Note (Signed)
 Iron deficiency anemia likely secondary to [facility] lesions and hiatal hernia.  Was recently admitted to the hospital requiring blood transfusions.  No iron levels on labs done at that time.  Could not tolerate oral iron. -The most likely cause of her anemia is due to chronic blood los. We discussed some of the risks, benefits, and alternatives of intravenous iron infusions. The patient is symptomatic from anemia and the iron level is critically low. She tolerated oral iron supplement poorly and desires to achieved higher levels of iron faster for adequate hematopoesis. Some of the side-effects to be expected including risks of infusion reactions, phlebitis, headaches, nausea and fatigue.  The patient is willing to proceed. Patient education material was dispensed.  Goal is to keep ferritin level greater than 50 and resolution of anemia  -Continue to follow with GI  Return to clinic in 4 weeks with labs to assess response for IV iron need for further IV iron

## 2023-06-03 NOTE — Progress Notes (Signed)
 [facility] at [facility] HEMATOLOGY NEW VISIT  Charlynne Pander, MD  REASON FOR REFERRAL: Normocytic anemia secondary to possible GI blood loss  HISTORY OF PRESENT ILLNESS: [name] 78 y.o. female referred for anemia.  She is accompanied by her caretaker at the group home.  She has a past medical history of DVT on Eliquis, esophageal dysphagia, depression.  Patient was recently admitted to the hospital for hemoglobin of less than 6 and required transfusion of packed red blood cells.  Endoscopy and colonoscopy was done at this time and was thought that bleeding was from a [place] lesion that healed with PPI.  She did report some melena before getting admitted to the hospital.  She reports some fatigue today.  Denies any more bright red blood per rectum or melena.  Denies weight loss.  Patient does report some diffuse abdominal pain but she has had it since hospitalization.  She has no other complaints today.  Labs done at this time showed anemia along with iron deficiency.  Patient reports being sick with oral iron use and currently is not taking it.  Patient is a former smoker but quit several years ago, nonalcoholic.  Lives in the long-term care facility.  Denies any family history of colon cancer.   I have reviewed the past medical history, past surgical history, social history and family history with the patient   ALLERGIES:  is allergic to diphenhydramine and tape.  MEDICATIONS:  Current Outpatient Medications  Medication Sig Dispense Refill   acetaminophen (TYLENOL) 325 MG tablet Take 650 mg by mouth every 6 (six) hours as needed for fever.      ascorbic acid (VITAMIN C) 500 MG tablet Take 1 tablet (500 mg total) by mouth daily.     atorvastatin (LIPITOR) 10 MG tablet Take 10 mg by mouth at bedtime.      busPIRone (BUSPAR) 5 MG tablet Take by mouth.     calcitonin, salmon, (MIACALCIN/FORTICAL) 200 UNIT/ACT nasal spray Place 1 spray into alternate nostrils  daily.     cholecalciferol (CHOLECALCIFEROL) 25 MCG tablet Take 1 tablet (1,000 Units total) by mouth daily.     cyanocobalamin 1000 MCG tablet Take 1,000 mcg by mouth daily.     dicyclomine (BENTYL) 20 MG tablet Take 20 mg by mouth 4 (four) times daily.     ELIQUIS 5 MG TABS tablet Take 5 mg by mouth 2 (two) times daily.     Erenumab-aooe (AIMOVIG) 140 MG/ML SOAJ Inject 140 mg into the skin every 30 (thirty) days. 1.12 mL 3   ferrous sulfate 325 (65 FE) MG tablet Take 1 tablet (325 mg total) by mouth every other day.     HYDROcodone-acetaminophen (NORCO/VICODIN) 5-325 MG tablet Take by mouth.     levothyroxine (SYNTHROID, LEVOTHROID) 50 MCG tablet Take 50 mcg by mouth daily before breakfast.      LORazepam (ATIVAN) 0.5 MG tablet Take 2 tablets (1 mg total) by mouth 2 (two) times daily as needed for anxiety. 10 tablet 0   memantine (NAMENDA) 10 MG tablet Take 10 mg by mouth 2 (two) times daily.     methenamine (HIPREX) 1 g tablet Take 1 g by mouth 2 (two) times daily.     OLANZapine (ZYPREXA) 10 MG tablet Take 10 mg by mouth at bedtime.     pantoprazole (PROTONIX) 40 MG tablet Take 40 mg by mouth daily.     pregabalin (LYRICA) 75 MG capsule Take 75 mg by mouth 3 (three)  times daily.     senna-docusate (SENOKOT-S) 8.6-50 MG tablet Take 2 tablets by mouth at bedtime.     venlafaxine XR (EFFEXOR-XR) 75 MG 24 hr capsule Take 75 mg by mouth daily.     No current facility-administered medications for this visit.     REVIEW OF SYSTEMS:   Constitutional: Denies fevers, chills or night sweats Eyes: Denies blurriness of vision Ears, nose, mouth, throat, and face: Denies mucositis or sore throat Respiratory: Denies cough, dyspnea or wheezes Cardiovascular: Denies palpitation, chest discomfort or lower extremity swelling Gastrointestinal:  Denies nausea, heartburn or change in bowel habits Skin: Denies abnormal skin rashes Lymphatics: Denies new lymphadenopathy or easy bruising Neurological:Denies  numbness, tingling or new weaknesses Behavioral/Psych: Mood is stable, no new changes  All other systems were reviewed with the patient and are negative.  PHYSICAL EXAMINATION:   Vitals:   06/03/23 1252  BP: 119/65  Pulse: 65  Resp: 19  Temp: 98.3 F (36.8 C)  SpO2: 98%    GENERAL:alert, no distress and comfortable, in a wheelchair LUNGS: clear to auscultation and percussion with normal breathing effort HEART: regular rate & rhythm and no murmurs and no lower extremity edema ABDOMEN:abdomen soft, non-tender and normal bowel sounds Musculoskeletal:no cyanosis of digits and no clubbing  NEURO: alert & oriented x 3 with fluent speech  LABORATORY DATA:  I have reviewed the data as listed on labs from primary care done on 05/15/2023 CBC: WBC: 5.2, hemoglobin: 8.9, hematocrit: 27.4, MCV: 87, platelets: 289  Lab Results  Component Value Date   WBC 5.4 05/12/2023   NEUTROABS 2.3 05/08/2023   HGB 8.4 (L) 05/12/2023   HCT 27.8 (L) 05/12/2023   MCV 90.6 05/12/2023   PLT 374 05/12/2023      Chemistry      Component Value Date/Time   NA 146 (H) 05/11/2023 0433   K 3.2 (L) 05/11/2023 0433   CL 113 (H) 05/11/2023 0433   CO2 25 05/11/2023 0433   BUN 5 (L) 05/11/2023 0433   CREATININE 0.54 05/11/2023 0433      Component Value Date/Time   CALCIUM 8.5 (L) 05/11/2023 0433   ALKPHOS 84 05/11/2023 0433   AST 13 (L) 05/11/2023 0433   ALT 8 05/11/2023 0433   BILITOT 0.3 05/11/2023 0433      Latest Reference Range & Units 05/08/23 11:38  Iron 28 - 170 ug/dL 12 (L)  UIBC ug/dL 865  TIBC 784 - 696 ug/dL 295  Saturation Ratios 10.4 - 31.8 % 4 (L)  Ferritin 11 - 307 ng/mL 15  Folate >5.9 ng/mL 10.8  Vitamin B12 180 - 914 pg/mL 383  (L): Data is abnormally low  Colonoscopy: Impression:  - Three 7 to 9 mm polyps in the sigmoid colon and in the ascending colon, removed with a cold snare. Resected and retrieved.  - The examined portion of the ileum was normal. - Diverticulosis in  the left colon.  - Non- bleeding external and internal hemorrhoids.  - This colonoscopy does not explain Anemia and melena as there was no evidence of active or old bleeding seen throughout the examined colon and terminal ileum  - Most likley cause of Melena from cameron lesion in setting of large hiatal hernia ( 6cm) which may have healed with PPI therpay inpatient .   Endoscopy: Impression:  - Normal esophagus.  - 6 cm hiatal hernia. - Erythematous mucosa in the stomach. Biopsied.  - Normal duodenal bulb and second portion of the duodenum.  - Duodenal  diverticulum. This EGD does not explain the cause of severe anemia. Perhaps patient had cameron lesion in setting of large hiatal hernia which have healed with PPI , there was some erythema near the GE junction  Pathology: FINAL MICROSCOPIC DIAGNOSIS:   A. COLON, ASCENDING , SIGMOID, POLYPECTOMY:       Fragments of tubular adenoma.       Negative for high-grade dysplasia.  FINAL MICROSCOPIC DIAGNOSIS:   A. STOMACH, BIOPSY:  Reactive gastropathy with mild chronic gastritis  Negative for H. pylori, intestinal metaplasia, dysplasia carcinoma    ASSESSMENT & PLAN:  Patient is a 78 year old female referred for iron deficiency anemia secondary to GI bleeding.  Iron deficiency anemia due to chronic blood loss Iron deficiency anemia likely secondary to [facility] lesions and hiatal hernia.  Was recently admitted to the hospital requiring blood transfusions.  No iron levels on labs done at that time.  Could not tolerate oral iron. -The most likely cause of her anemia is due to chronic blood los. We discussed some of the risks, benefits, and alternatives of intravenous iron infusions. The patient is symptomatic from anemia and the iron level is critically low. She tolerated oral iron supplement poorly and desires to achieved higher levels of iron faster for adequate hematopoesis. Some of the side-effects to be expected including risks of infusion  reactions, phlebitis, headaches, nausea and fatigue.  The patient is willing to proceed. Patient education material was dispensed.  Goal is to keep ferritin level greater than 50 and resolution of anemia  -Continue to follow with GI  Return to clinic in 4 weeks with labs to assess response for IV iron need for further IV iron    Orders Placed This Encounter  Procedures   Ferritin    Standing Status:   Future    Expected Date:   07/01/2023    Expiration Date:   06/02/2024   Folate    Standing Status:   Future    Expected Date:   07/01/2023    Expiration Date:   06/02/2024   Vitamin B12    Standing Status:   Future    Expected Date:   07/01/2023    Expiration Date:   06/02/2024   CBC with Differential/Platelet    Standing Status:   Future    Expected Date:   07/01/2023    Expiration Date:   06/02/2024   Comprehensive metabolic panel    Standing Status:   Future    Expected Date:   07/01/2023    Expiration Date:   06/02/2024   Iron and TIBC    Standing Status:   Future    Expected Date:   07/01/2023    Expiration Date:   06/02/2024    The total time spent in the appointment was 40 minutes encounter with patients including review of chart and various tests results, discussions about plan of care and coordination of care plan   All questions were answered. The patient knows to call the clinic with any problems, questions or concerns. No barriers to learning was detected.   Cindie Crumbly, MD 3/10/20251:36 PM

## 2023-06-10 ENCOUNTER — Inpatient Hospital Stay: Payer: Medicare (Managed Care)

## 2023-06-10 VITALS — BP 144/56 | HR 52 | Temp 97.8°F | Resp 18

## 2023-06-10 DIAGNOSIS — D500 Iron deficiency anemia secondary to blood loss (chronic): Secondary | ICD-10-CM | POA: Diagnosis not present

## 2023-06-10 MED ORDER — SODIUM CHLORIDE 0.9 % IV SOLN: INTRAVENOUS | Status: DC

## 2023-06-10 MED ORDER — CETIRIZINE HCL 10 MG/ML IV SOLN
10.0000 mg | Freq: Once | INTRAVENOUS | Status: AC
  Administered 2023-06-10: 10 mg via INTRAVENOUS
  Filled 2023-06-10: qty 1

## 2023-06-10 MED ORDER — ACETAMINOPHEN 325 MG PO TABS
650.0000 mg | ORAL_TABLET | Freq: Once | ORAL | Status: AC
  Administered 2023-06-10: 650 mg via ORAL
  Filled 2023-06-10: qty 2

## 2023-06-10 MED ORDER — SODIUM CHLORIDE 0.9 % IV SOLN
1000.0000 mg | Freq: Once | INTRAVENOUS | Status: AC
  Administered 2023-06-10: 1000 mg via INTRAVENOUS
  Filled 2023-06-10: qty 10

## 2023-06-10 NOTE — Patient Instructions (Signed)
 [facility] [place] - A DEPT OF [facility]  Discharge Instructions: Thank you for choosing [facility] to provide your oncology and hematology care.  If you have a lab appointment with the Cancer Center - please note that after April 8th, 2024, all labs will be drawn in the cancer center.  You do not have to check in or register with the main entrance as you have in the past but will complete your check-in in the cancer center.  Wear comfortable clothing and clothing appropriate for easy access to any Portacath or PICC line.   We strive to give you quality time with your provider. You may need to reschedule your appointment if you arrive late (15 or more minutes).  Arriving late affects you and other patients whose appointments are after yours.  Also, if you miss three or more appointments without notifying the office, you may be dismissed from the clinic at the provider's discretion.      For prescription refill requests, have your pharmacy contact our office and allow 72 hours for refills to be completed.    Today you received the following chemotherapy and/or immunotherapy agents Monoferric      To help prevent nausea and vomiting after your treatment, we encourage you to take your nausea medication as directed.  BELOW ARE SYMPTOMS THAT SHOULD BE REPORTED IMMEDIATELY: *FEVER GREATER THAN 100.4 F (38 C) OR HIGHER *CHILLS OR SWEATING *NAUSEA AND VOMITING THAT IS NOT CONTROLLED WITH YOUR NAUSEA MEDICATION *UNUSUAL SHORTNESS OF BREATH *UNUSUAL BRUISING OR BLEEDING *URINARY PROBLEMS (pain or burning when urinating, or frequent urination) *BOWEL PROBLEMS (unusual diarrhea, constipation, pain near the anus) TENDERNESS IN MOUTH AND THROAT WITH OR WITHOUT PRESENCE OF ULCERS (sore throat, sores in mouth, or a toothache) UNUSUAL RASH, SWELLING OR PAIN  UNUSUAL VAGINAL DISCHARGE OR ITCHING   Items with * indicate a potential emergency and should be followed  up as soon as possible or go to the Emergency Department if any problems should occur.  Please show the CHEMOTHERAPY ALERT CARD or IMMUNOTHERAPY ALERT CARD at check-in to the Emergency Department and triage nurse.  Should you have questions after your visit or need to cancel or reschedule your appointment, please contact [facility] [place] - A DEPT OF Eligha [facility] [phone]  and follow the prompts.  Office hours are 8:00 a.m. to 4:30 p.m. Monday - Friday. Please note that voicemails left after 4:00 p.m. may not be returned until the following business day.  We are closed weekends and major holidays. You have access to a nurse at all times for urgent questions. Please call the main number to the clinic [phone] and follow the prompts.  For any non-urgent questions, you may also contact your provider using MyChart. We now offer e-Visits for anyone 54 and older to request care online for non-urgent symptoms. For details visit [email or web address].   Also download the MyChart app! Go to the app store, search "MyChart", open the app, select [place], and log in with your MyChart username and password.

## 2023-06-10 NOTE — Progress Notes (Signed)
Patient presents today for Monoferric infusion per providers order.  Vital signs WNL.  Patient has no new complaints at this time.    Peripheral IV started and blood return noted pre and post infusion.  Stable during infusion without adverse affects.  Vital signs stable.  No complaints at this time.  Discharge from clinic ambulatory in stable condition.  Alert and oriented X 3.  Follow up with [facility] as scheduled.

## 2023-06-21 ENCOUNTER — Encounter: Payer: Self-pay | Admitting: Internal Medicine

## 2023-06-21 ENCOUNTER — Ambulatory Visit (INDEPENDENT_AMBULATORY_CARE_PROVIDER_SITE_OTHER): Payer: Medicare (Managed Care) | Admitting: Internal Medicine

## 2023-06-21 VITALS — BP 146/68 | HR 70 | Temp 98.1°F | Ht 67.0 in | Wt 170.0 lb

## 2023-06-21 DIAGNOSIS — K921 Melena: Secondary | ICD-10-CM

## 2023-06-21 DIAGNOSIS — R1310 Dysphagia, unspecified: Secondary | ICD-10-CM

## 2023-06-21 DIAGNOSIS — D649 Anemia, unspecified: Secondary | ICD-10-CM | POA: Diagnosis not present

## 2023-06-21 DIAGNOSIS — R1319 Other dysphagia: Secondary | ICD-10-CM

## 2023-06-21 NOTE — Progress Notes (Signed)
 Primary Care Physician:  Charlynne Pander, MD Primary Gastroenterologist:  Dr. Jena Gauss  Pre-Procedure History & Physical: HPI:  [name] is a 78 y.o. female here for here for hospital follow-up melena/GI bleeding hemoglobin nadir to 5.7 up to 8.4 discharged with 2 units.  EGD demonstrated large hiatal hernia non-H. pylori gastritis colonoscopy demonstrated diverticulosis and some small adenomas removed.  Felt that her degree of anemia was not adequately explained by evaluation to this point.  Capsule recommended.  She has had no further melena no hematochezia no nausea or vomiting has not had a follow-up CBC since discharge.  She remains off of Eliquis pending outcome of GI evaluation.  Lee's note patient continues to complain of dysphagia to me.  She states that she has trouble swallowing pills they get caught pointing to her suprasternal notch.  Esophagus did appear patent on recent EGD.  Past Medical History:  Diagnosis Date   Anemia    Anemia    Anxiety    Anxiety and depression    Bacteremia    Cellulitis    Chronic back pain    Chronic diarrhea    Chronic right hip pain    Complication of anesthesia    Depression    DVT (deep venous thrombosis) (HCC)    Dysphagia    GERD (gastroesophageal reflux disease)    Gout    Gout    Headache    Hyperlipidemia    Hypopotassemia    Hypothyroidism    IBS (irritable bowel syndrome)    Major depressive disorder    Muscle weakness (generalized)    Osteoporosis    Pneumonia    PONV (postoperative nausea and vomiting)    Pressure ulcer    Pressure ulcer of buttock    Psychosis (HCC)    Thyroid disease    hypothyroidism    Past Surgical History:  Procedure Laterality Date   BIOPSY  05/10/2023   Procedure: BIOPSY;  Surgeon: Franky Macho, MD;  [facility];  Service: Endoscopy;;   COLONOSCOPY  2007   Dr. Benard Rink: Adenomatous colon polyp   COLONOSCOPY WITH PROPOFOL N/A 05/11/2023   Procedure: COLONOSCOPY WITH  PROPOFOL;  Surgeon: Franky Macho, MD;  [facility]: [facility];  Service: Endoscopy;  Laterality: N/A;   ESOPHAGOGASTRODUODENOSCOPY N/A 07/09/2016   Procedure: ESOPHAGOGASTRODUODENOSCOPY (EGD);  Surgeon: Corbin Ade, MD;  [facility];  Service: Endoscopy;  Laterality: N/A;   ESOPHAGOGASTRODUODENOSCOPY (EGD) WITH PROPOFOL N/A 04/18/2017   Procedure: ESOPHAGOGASTRODUODENOSCOPY (EGD) WITH PROPOFOL;  Surgeon: Corbin Ade, MD;  [facility];  Service: Endoscopy;  Laterality: N/A;  10:30am   ESOPHAGOGASTRODUODENOSCOPY (EGD) WITH PROPOFOL N/A 05/10/2023   Procedure: ESOPHAGOGASTRODUODENOSCOPY (EGD) WITH PROPOFOL;  Surgeon: Franky Macho, MD;  [facility];  Service: Endoscopy;  Laterality: N/A;   HIP ARTHROPLASTY  05/01/2011   Procedure: right ARTHROPLASTY BIPOLAR HIP;  Surgeon: Fuller Canada, MD;  Location: [facility];  Service: Orthopedics;  Laterality: Right;   INCISION AND DRAINAGE HIP  02/08/2012   Procedure: IRRIGATION AND DEBRIDEMENT HIP;  Surgeon: Vickki Hearing, MD;  Location: [facility];  Service: Orthopedics;  Laterality: Right;   INCISION AND DRAINAGE OF WOUND  05/24/2011   Procedure: IRRIGATION AND DEBRIDEMENT WOUND;  Surgeon: Fuller Canada, MD;  Location: [facility];  Service: Orthopedics;  Laterality: Right;   MALONEY DILATION N/A 04/18/2017   Procedure: Elease Hashimoto DILATION;  Surgeon: Corbin Ade, MD;  Location: [facility];  Service: Endoscopy;  Laterality: N/A;   POLYPECTOMY  05/11/2023   Procedure: POLYPECTOMY;  Surgeon: Franky Macho, MD;  [facility];  Service: Endoscopy;;    Prior to Admission medications   Medication Sig Start Date End Date Taking? Authorizing Provider  acetaminophen (TYLENOL) 325 MG tablet Take 650 mg by mouth every 6 (six) hours as needed for fever.    Yes [provider]  atorvastatin (LIPITOR) 10 MG tablet Take 10 mg by mouth at bedtime.    Yes [provider]  brimonidine  (ALPHAGAN) 0.2 % ophthalmic solution Place 1 drop into both eyes 3 (three) times daily.   Yes [provider]  busPIRone (BUSPAR) 5 MG tablet Take by mouth. 05/02/23  Yes [provider]  calcitonin, salmon, (MIACALCIN/FORTICAL) 200 UNIT/ACT nasal spray Place 1 spray into alternate nostrils daily. 05/05/23  Yes [provider]  cholecalciferol (CHOLECALCIFEROL) 25 MCG tablet Take 1 tablet (1,000 Units total) by mouth daily. 05/14/23  Yes Johnson, Clanford L, MD  cyanocobalamin 1000 MCG tablet Take 1,000 mcg by mouth daily.   Yes [provider]  dicyclomine (BENTYL) 20 MG tablet Take 20 mg by mouth 4 (four) times daily. 06/19/21  Yes [provider]  ELIQUIS 5 MG TABS tablet Take 5 mg by mouth 2 (two) times daily. 05/06/23  Yes [provider]  Erenumab-aooe (AIMOVIG) 140 MG/ML SOAJ Inject 140 mg into the skin every 30 (thirty) days. 11/16/21  Yes Ocie Doyne, MD  guaiFENesin (ROBITUSSIN) 100 MG/5ML liquid Take 10 mLs by mouth every 4 (four) hours as needed for cough or to loosen phlegm.   Yes [provider]  latanoprost (XALATAN) 0.005 % ophthalmic solution Place 1 drop into both eyes at bedtime.   Yes [provider]  levothyroxine (SYNTHROID, LEVOTHROID) 50 MCG tablet Take 50 mcg by mouth daily before breakfast.    Yes [provider]  LORazepam (ATIVAN) 0.5 MG tablet Take 2 tablets (1 mg total) by mouth 2 (two) times daily as needed for anxiety. 05/13/23  Yes Johnson, Clanford L, MD  memantine (NAMENDA) 10 MG tablet Take 10 mg by mouth 2 (two) times daily.   Yes [provider]  methenamine (HIPREX) 1 g tablet Take 1 g by mouth 2 (two) times daily. 04/09/23  Yes [provider]  OLANZapine (ZYPREXA) 10 MG tablet Take 10 mg by mouth at bedtime.   Yes [provider]  pantoprazole (PROTONIX) 40 MG tablet Take 40 mg by mouth daily. 04/10/23  Yes [provider]  pregabalin (LYRICA) 75 MG  capsule Take 75 mg by mouth 3 (three) times daily. 05/31/23  Yes [provider]  senna-docusate (SENOKOT-S) 8.6-50 MG tablet Take 2 tablets by mouth at bedtime. 05/13/23  Yes Johnson, Clanford L, MD  Vaginal Lubricant (REPLENS) GEL Place 1 Application vaginally at bedtime.   Yes [provider]  venlafaxine XR (EFFEXOR-XR) 75 MG 24 hr capsule Take 75 mg by mouth daily. 04/27/23  Yes [provider]    Allergies as of 06/21/2023 - Review Complete 06/21/2023  Allergen Reaction Noted   Diphenhydramine Other (See Comments) 04/30/2011   Tape Dermatitis 11/20/2013    Family History  Problem Relation Age of Onset   Migraines Mother     Social History   Socioeconomic History   Marital status: Single    Spouse name: Not on file   Number of children: Not on file   Years of education: Not on file   Highest education level: Not on  file  Occupational History   Not on file  Tobacco Use   Smoking status: Never   Smokeless tobacco: Never  Vaping Use   Vaping status: Never Used  Substance and Sexual Activity   Alcohol use: No   Drug use: No   Sexual activity: Never  Other Topics Concern   Not on file  Social History Narrative   03/16/21 lives at [facility]   Patient's legal guardian is Mr. Zebedee Iba. Phone number [phone].   Social Drivers of Corporate investment banker Strain: Not on file  Food Insecurity: No Food Insecurity (05/08/2023)   Hunger Vital Sign    Worried About Running Out of Food in the Last Year: Never true    Ran Out of Food in the Last Year: Never true  Transportation Needs: No Transportation Needs (05/08/2023)   PRAPARE - Administrator, Civil Service (Medical): No    Lack of Transportation (Non-Medical): No  Physical Activity: Not on file  Stress: Not on file  Social Connections: Patient Unable To Answer (05/09/2023)   Social Connection and Isolation Panel [NHANES]    Frequency of Communication with  Friends and Family: Patient unable to answer    Frequency of Social Gatherings with Friends and Family: Patient unable to answer    Attends Religious Services: Patient unable to answer    Active Member of Clubs or Organizations: Patient unable to answer    Attends Banker Meetings: Patient unable to answer    Marital Status: Patient unable to answer  Intimate Partner Violence: Not At Risk (05/08/2023)   Humiliation, Afraid, Rape, and Kick questionnaire    Fear of Current or Ex-Partner: No    Emotionally Abused: No    Physically Abused: No    Sexually Abused: No    Review of Systems: See HPI, otherwise negative ROS  Physical Exam: BP (!) 146/68 (BP Location: Left Arm, Patient Position: Sitting, Cuff Size: Normal)   Pulse 70   Temp 98.1 F (36.7 C) (Oral)   Ht 5\' 7"  (1.702 m)   Wt 170 lb (77.1 kg) Comment: facility reported 06/06/23  SpO2 95%   BMI 26.63 kg/m  General:   Alert,  pleasant and cooperative in NAD.  Accompanied by nursing home caregiver. Neck:  Supple; no masses or thyromegaly. No significant cervical adenopathy. Lungs:  Clear throughout to auscultation.   No wheezes, crackles, or rhonchi. No acute distress. Heart:  Regular rate and rhythm; no murmurs, clicks, rubs,  or gallops. Abdomen: Non-distended, normal bowel sounds.  Soft and nontender without appreciable mass or hepatosplenomegaly.  Pulses:  Normal pulses noted. Extremities:  Without clubbing or edema.  Impression/Plan: 78 year old lady with multiple comorbidities recently admitted with melena and a profound anemia requiring transfusion.  EGD and colonoscopy revealed no findings that would adequately explain her presentation.  Consequently, she is being offered a capsule study at this time.  Although her tubular esophagus was patent at the time of EGD she competed it continues to provide complaint of esophageal dysphagia.  Given this scenario, I feel it would be best offer her an EGD with capsule  placement and provide an empiric dilation of her esophagus as feasible/appropriate.   Recommendations:  CBC today or the first of next week  We will schedule an upper endoscopy with placement of a video capsule   Empiric dilation at that time as feasible / appropriate.  The risks, benefits, limitations, alternatives and imponderables have been reviewed with the  patient. Potential for esophageal dilation, biopsy, etc. have also been reviewed.  Questions have been answered. All parties agreeable.  Will plan to stretch your esophagus at that time.  No iron for 5 days prior to the procedure.  Continue to hold Eliquis until instructed otherwise.  Further recommendations to follow       Notice: This dictation was prepared with Dragon dictation along with smaller phrase technology. Any transcriptional errors that result from this process are unintentional and may not be corrected upon review.

## 2023-06-21 NOTE — Patient Instructions (Signed)
 CBC today or the first of next week  We will schedule an upper endoscopy with placement of a video capsule to take pictures of your small intestine complete evaluation for GI blood loss  Will plan to stretch your esophagus at that time.  No iron for 5 days prior to the procedure.  Further recommendations to follow

## 2023-06-24 ENCOUNTER — Other Ambulatory Visit: Payer: Self-pay | Admitting: Internal Medicine

## 2023-06-24 DIAGNOSIS — Z1231 Encounter for screening mammogram for malignant neoplasm of breast: Secondary | ICD-10-CM

## 2023-06-25 ENCOUNTER — Encounter: Payer: Self-pay | Admitting: Oncology

## 2023-07-03 ENCOUNTER — Telehealth: Payer: Self-pay | Admitting: *Deleted

## 2023-07-03 NOTE — Telephone Encounter (Signed)
 [facility] to schedule procedures  EGD/ED w/capsule placement w/scope, asa 3, [email or web address]

## 2023-07-08 ENCOUNTER — Inpatient Hospital Stay: Payer: Medicare (Managed Care)

## 2023-07-09 ENCOUNTER — Ambulatory Visit
Admission: RE | Admit: 2023-07-09 | Discharge: 2023-07-09 | Disposition: A | Discharge status: Home / Self Care | Payer: Medicare (Managed Care) | Source: Ambulatory Visit | Attending: Internal Medicine | Admitting: Internal Medicine

## 2023-07-09 DIAGNOSIS — Z1231 Encounter for screening mammogram for malignant neoplasm of breast: Secondary | ICD-10-CM

## 2023-07-10 ENCOUNTER — Inpatient Hospital Stay: Payer: Medicare (Managed Care) | Admitting: Oncology

## 2023-07-16 ENCOUNTER — Inpatient Hospital Stay: Payer: Medicare (Managed Care)

## 2023-07-19 ENCOUNTER — Inpatient Hospital Stay: Payer: Medicare (Managed Care)

## 2023-07-22 ENCOUNTER — Inpatient Hospital Stay: Payer: Medicare (Managed Care) | Admitting: Oncology

## 2023-07-22 ENCOUNTER — Telehealth: Payer: Self-pay | Admitting: Internal Medicine

## 2023-07-22 NOTE — Telephone Encounter (Signed)
 Sondra from [facility] left a message to schedule patient for an appointment but I saw your note from 07/03/23 that you had called and left message to schedule procedures.  I am thinking [facility] is wanting to schedule the procedures but if it is an appointment then you can forward back to me and I will call and schedule appt.  [phone] ext. [phone]

## 2023-07-22 NOTE — Telephone Encounter (Signed)
 LMOVM to return call.

## 2023-07-23 NOTE — Telephone Encounter (Signed)
 Will need to schedule once we get providers June schedule.

## 2023-07-26 ENCOUNTER — Inpatient Hospital Stay: Payer: Medicare (Managed Care) | Admitting: Oncology

## 2023-07-26 ENCOUNTER — Inpatient Hospital Stay: Payer: Medicare (Managed Care) | Attending: Oncology

## 2023-07-26 DIAGNOSIS — D500 Iron deficiency anemia secondary to blood loss (chronic): Secondary | ICD-10-CM | POA: Diagnosis present

## 2023-07-26 DIAGNOSIS — K5909 Other constipation: Secondary | ICD-10-CM | POA: Diagnosis not present

## 2023-07-26 LAB — CBC WITH DIFFERENTIAL/PLATELET
Abs Immature Granulocytes: 0.01 10*3/uL (ref 0.00–0.07)
Basophils Absolute: 0 10*3/uL (ref 0.0–0.1)
Basophils Relative: 1 %
Eosinophils Absolute: 0.2 10*3/uL (ref 0.0–0.5)
Eosinophils Relative: 5 %
HCT: 39.7 % (ref 36.0–46.0)
Hemoglobin: 12.2 g/dL (ref 12.0–15.0)
Immature Granulocytes: 0 %
Lymphocytes Relative: 28 %
Lymphs Abs: 1.4 10*3/uL (ref 0.7–4.0)
MCH: 27.9 pg (ref 26.0–34.0)
MCHC: 30.7 g/dL (ref 30.0–36.0)
MCV: 90.6 fL (ref 80.0–100.0)
Monocytes Absolute: 0.4 10*3/uL (ref 0.1–1.0)
Monocytes Relative: 8 %
Neutro Abs: 2.8 10*3/uL (ref 1.7–7.7)
Neutrophils Relative %: 58 %
Platelets: 302 10*3/uL (ref 150–400)
RBC: 4.38 MIL/uL (ref 3.87–5.11)
RDW: 16.4 % — ABNORMAL HIGH (ref 11.5–15.5)
WBC: 4.9 10*3/uL (ref 4.0–10.5)
nRBC: 0 % (ref 0.0–0.2)

## 2023-07-26 LAB — IRON AND TIBC
Iron: 42 ug/dL (ref 28–170)
Saturation Ratios: 17 % (ref 10.4–31.8)
TIBC: 250 ug/dL (ref 250–450)
UIBC: 208 ug/dL

## 2023-07-26 LAB — FERRITIN: Ferritin: 55 ng/mL (ref 11–307)

## 2023-07-26 LAB — COMPREHENSIVE METABOLIC PANEL WITH GFR
ALT: 10 U/L (ref 0–44)
AST: 14 U/L — ABNORMAL LOW (ref 15–41)
Albumin: 3.4 g/dL — ABNORMAL LOW (ref 3.5–5.0)
Alkaline Phosphatase: 93 U/L (ref 38–126)
Anion gap: 10 (ref 5–15)
BUN: 11 mg/dL (ref 8–23)
CO2: 28 mmol/L (ref 22–32)
Calcium: 8.8 mg/dL — ABNORMAL LOW (ref 8.9–10.3)
Chloride: 105 mmol/L (ref 98–111)
Creatinine, Ser: 0.66 mg/dL (ref 0.44–1.00)
GFR, Estimated: >60 mL/min (ref >60)
Glucose, Bld: 94 mg/dL (ref 70–99)
Potassium: 3.1 mmol/L — ABNORMAL LOW (ref 3.5–5.1)
Sodium: 143 mmol/L (ref 135–145)
Total Bilirubin: 0.4 mg/dL (ref 0.0–1.2)
Total Protein: 6.5 g/dL (ref 6.5–8.1)

## 2023-07-26 LAB — FOLATE: Folate: 5.8 ng/mL — ABNORMAL LOW (ref >5.9)

## 2023-07-26 LAB — VITAMIN B12: Vitamin B-12: 345 pg/mL (ref 180–914)

## 2023-08-02 ENCOUNTER — Inpatient Hospital Stay (HOSPITAL_BASED_OUTPATIENT_CLINIC_OR_DEPARTMENT_OTHER): Payer: Medicare (Managed Care) | Admitting: Oncology

## 2023-08-02 VITALS — BP 153/55 | HR 64 | Temp 97.7°F | Resp 18

## 2023-08-02 DIAGNOSIS — D500 Iron deficiency anemia secondary to blood loss (chronic): Secondary | ICD-10-CM

## 2023-08-02 NOTE — Assessment & Plan Note (Signed)
 Iron deficiency anemia likely secondary to [facility] lesions and hiatal hernia.   Was recently admitted to the hospital requiring blood transfusions.   S/p IV iron infusion with significant improvement Resolved anemia and iron deficiency at this time  - Patient does have chronic constipation and uses pain medication.  Will not recommend oral iron supplementation - Recommended to eat healthy with proteins and greens - Continue to follow with GI  Return to clinic in 3 months with labs

## 2023-08-02 NOTE — Progress Notes (Signed)
 [facility] at [facility]  HEMATOLOGY FOLLOW-UP VISIT  Terri Early, MD  REASON FOR FOLLOW-UP: Iron deficiency anemia likely secondary to GI bleed  ASSESSMENT & PLAN:  Patient is a 78 y.o. female following for iron deficiency anemia  Iron deficiency anemia due to chronic blood loss Iron deficiency anemia likely secondary to [facility] lesions and hiatal hernia.   Was recently admitted to the hospital requiring blood transfusions.   S/p IV iron infusion with significant improvement Resolved anemia and iron deficiency at this time  - Patient does have chronic constipation and uses pain medication.  Will not recommend oral iron supplementation - Recommended to eat healthy with proteins and greens - Continue to follow with GI  Return to clinic in 3 months with labs   Orders Placed This Encounter  Procedures   Ferritin    Standing Status:   Future    Expected Date:   10/28/2023    Expiration Date:   08/01/2024   Folate    Standing Status:   Future    Expected Date:   10/28/2023    Expiration Date:   08/01/2024   Vitamin B12    Standing Status:   Future    Expected Date:   10/28/2023    Expiration Date:   08/01/2024   CBC with Differential/Platelet    Standing Status:   Future    Expected Date:   10/28/2023    Expiration Date:   08/01/2024   Comprehensive metabolic panel with GFR    Standing Status:   Future    Expected Date:   10/28/2023    Expiration Date:   08/01/2024   Iron and TIBC    Standing Status:   Future    Expected Date:   10/28/2023    Expiration Date:   08/01/2024    The total time spent in the appointment was 20 minutes encounter with patients including review of chart and various tests results, discussions about plan of care and coordination of care plan   All questions were answered. The patient knows to call the clinic with any problems, questions or concerns. No barriers to learning was detected.  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 female following for iron deficiency anemia. [facility] was [facility]. She no longer feels tired and notes an improvement in her symptoms since receiving iron infusions. She is not currently taking iron pills and is unsure if they cause constipation.  She is overall feeling well.  I have reviewed the past medical history, past surgical history, social history and family history with the patient   ALLERGIES:  is allergic to diphenhydramine  and tape.  MEDICATIONS:  Current Outpatient Medications  Medication Sig Dispense Refill   acetaminophen  (TYLENOL ) 325 MG tablet Take 650 mg by mouth every 6 (six) hours as needed for fever.      atorvastatin  (LIPITOR) 10 MG tablet Take 10 mg by mouth at bedtime.      brimonidine (ALPHAGAN) 0.2 % ophthalmic solution Place 1 drop into both eyes 3 (three) times daily.     busPIRone (BUSPAR) 5 MG tablet Take by mouth.     calcitonin, salmon, (MIACALCIN /FORTICAL) 200 UNIT/ACT nasal spray Place 1 spray into alternate nostrils daily.  cholecalciferol  (CHOLECALCIFEROL ) 25 MCG tablet Take 1 tablet (1,000 Units total) by mouth daily.     cyanocobalamin  1000 MCG tablet Take 1,000 mcg by mouth daily.     dicyclomine  (BENTYL ) 20 MG tablet Take 20 mg by mouth 4 (four) times daily.     ELIQUIS 5 MG TABS tablet Take 5 mg by mouth 2 (two) times daily.     Erenumab -aooe (AIMOVIG ) 140 MG/ML SOAJ Inject 140 mg into the skin every 30 (thirty) days. 1.12 mL 3   guaiFENesin  (ROBITUSSIN) 100 MG/5ML liquid Take 10 mLs by mouth every 4 (four) hours as needed for cough or to loosen phlegm.     latanoprost (XALATAN) 0.005 % ophthalmic solution Place 1 drop into both eyes at bedtime.     levothyroxine   (SYNTHROID , LEVOTHROID) 50 MCG tablet Take 50 mcg by mouth daily before breakfast.      LORazepam  (ATIVAN ) 0.5 MG tablet Take 2 tablets (1 mg total) by mouth 2 (two) times daily as needed for anxiety. 10 tablet 0   memantine  (NAMENDA ) 10 MG tablet Take 10 mg by mouth 2 (two) times daily.     methenamine (HIPREX) 1 g tablet Take 1 g by mouth 2 (two) times daily.     OLANZapine  (ZYPREXA ) 10 MG tablet Take 10 mg by mouth at bedtime.     pantoprazole  (PROTONIX ) 40 MG tablet Take 40 mg by mouth daily.     pregabalin  (LYRICA ) 75 MG capsule Take 75 mg by mouth 3 (three) times daily.     senna-docusate (SENOKOT-S) 8.6-50 MG tablet Take 2 tablets by mouth at bedtime.     Vaginal Lubricant (REPLENS) GEL Place 1 Application vaginally at bedtime.     venlafaxine  XR (EFFEXOR -XR) 75 MG 24 hr capsule Take 75 mg by mouth daily.     No current facility-administered medications for this visit.     REVIEW OF SYSTEMS:   Constitutional: Denies fevers, chills or night sweats Eyes: Denies blurriness of vision Ears, nose, mouth, throat, and face: Denies mucositis or sore throat Respiratory: Denies cough, dyspnea or wheezes Cardiovascular: Denies palpitation, chest discomfort or lower extremity swelling Gastrointestinal:  Denies nausea, heartburn or change in bowel habits Skin: Denies abnormal skin rashes Lymphatics: Denies new lymphadenopathy or easy bruising Neurological:Denies numbness, tingling or new weaknesses Behavioral/Psych: Mood is stable, no new changes  All other systems were reviewed with the patient and are negative.  PHYSICAL EXAMINATION:   Vitals:   08/02/23 0846 08/02/23 0851  BP: (!) 172/66 (!) 153/55  Pulse: 64   Resp: 18   Temp: 97.7 F (36.5 C)   SpO2: 98%     GENERAL:alert, no distress and comfortable SKIN: skin color, texture, turgor are normal, no rashes or significant lesions LUNGS: clear to auscultation and percussion with normal breathing effort HEART: regular rate &  rhythm and no murmurs and no lower extremity edema ABDOMEN:abdomen soft, non-tender and normal bowel sounds Musculoskeletal:no cyanosis of digits and no clubbing  NEURO: alert & oriented x 3 with fluent speech  LABORATORY DATA:  I have reviewed the data as listed  Lab Results  Component Value Date   WBC 4.9 07/26/2023   NEUTROABS 2.8 07/26/2023   HGB 12.2 07/26/2023   HCT 39.7 07/26/2023   MCV 90.6 07/26/2023   PLT 302 07/26/2023      Chemistry      Component Value Date/Time   NA 143 07/26/2023 0824   K 3.1 (L) 07/26/2023 0824   CL 105 07/26/2023 0824  CO2 28 07/26/2023 0824   BUN 11 07/26/2023 0824   CREATININE 0.66 07/26/2023 0824      Component Value Date/Time   CALCIUM  8.8 (L) 07/26/2023 0824   ALKPHOS 93 07/26/2023 0824   AST 14 (L) 07/26/2023 0824   ALT 10 07/26/2023 0824   BILITOT 0.4 07/26/2023 0824      Latest Reference Range & Units 07/26/23 08:23 07/26/23 08:24  Iron 28 - 170 ug/dL 42   UIBC ug/dL 161   TIBC 096 - 045 ug/dL 409   Saturation Ratios 10.4 - 31.8 % 17   Ferritin 11 - 307 ng/mL 55   Folate >5.9 ng/mL  5.8 (L)  Vitamin B12 180 - 914 pg/mL 345   (L): Data is abnormally low   RADIOGRAPHIC STUDIES: I have personally reviewed the radiological images as listed and agreed with the findings in the report.  None to review

## 2023-08-08 ENCOUNTER — Encounter: Payer: Self-pay | Admitting: *Deleted

## 2023-08-08 NOTE — Telephone Encounter (Signed)
  Request for patient to stop medication prior to procedure or is needing cleareance  08/08/23  [name] [birth year]  What type of surgery is being performed? EGD with esophageal dilation with GIVENS capsule placement   When is surgery scheduled? 09/25/23  What type of clearance is required (medical or pharmacy to hold medication or both? medication  Are there any medications that need to be held prior to surgery and how long? Eliquis x 2 days  Name of physician performing surgery?  Dr.Rourk [facility] at [employer]: [phone] Fax: [phone]  Anethesia type (none, local, MAC, general)? MAC

## 2023-08-08 NOTE — Telephone Encounter (Signed)
 Pt has been scheduled for 09/25/23. Instructions faxed to the facility.

## 2023-08-08 NOTE — Telephone Encounter (Signed)
 Spoke with [place] at [facility] and she stated pt is taking Eliquis 5 mg. Clearance faxed for pt to hold Eliquis x 2 days prior to procedure.    LMOVM of Eusebio High, nursing supervisor at [facility], to call back to confirm that pt is not taking Eliquis.

## 2023-08-08 NOTE — Telephone Encounter (Signed)
 LMOVM of Eusebio High, nursing supervisor  at [facility], to call back to confirm that pt is not taking Eliquis.

## 2023-08-28 ENCOUNTER — Encounter: Payer: Self-pay | Admitting: *Deleted

## 2023-08-28 NOTE — Telephone Encounter (Signed)
 Clearance to hold Eliquis scanned under media tab. FYI

## 2023-08-28 NOTE — Telephone Encounter (Signed)
 Instructions and pre-op date faxed to [phone] attn: Erling Hawthorne

## 2023-08-28 NOTE — Telephone Encounter (Signed)
 Clearance to hold Eliquis scanned under media tab

## 2023-09-09 ENCOUNTER — Ambulatory Visit: Payer: Medicare (Managed Care) | Admitting: Family Medicine

## 2023-09-17 NOTE — Telephone Encounter (Signed)
 Spoke with Aliene at [employer] regarding prior authorization. Prior authorization has been faxed to [phone] Ref #[id number]

## 2023-09-23 ENCOUNTER — Encounter (HOSPITAL_COMMUNITY)
Admission: RE | Admit: 2023-09-23 | Discharge: 2023-09-23 | Disposition: A | Discharge status: Home / Self Care | Payer: Medicare (Managed Care) | Source: Ambulatory Visit | Attending: Internal Medicine | Admitting: Internal Medicine

## 2023-09-23 ENCOUNTER — Other Ambulatory Visit: Payer: Self-pay

## 2023-09-23 ENCOUNTER — Encounter (HOSPITAL_COMMUNITY): Payer: Self-pay

## 2023-09-23 NOTE — Patient Instructions (Signed)
    [name]  09/23/2023     @PREFPERIOPPHARMACY @  // Your procedure is scheduled on 09/25/2023.  // Report to [name] at  0645 A.M.   Call this number if you have problems the morning of surgery:  [phone]  If you experience any cold or flu symptoms such as cough, fever, chills, shortness of breath, etc. between now and your scheduled surgery, please notify us  at the above number.   Remember:  Follow the enclosed information from office for diet instructions.   You may drink clear liquids until 0415 am on 09/25/2023.  Clear liquids allowed are:                    Water , Juice (No red color; non-citric and without pulp; diabetics please choose diet or no sugar options), Carbonated beverages (diabetics please choose diet or no sugar options), Clear Tea (No creamer, milk, or cream, including half & half and powdered creamer), Black Coffee Only (No creamer, milk or cream, including half & half and powdered creamer), Plain Jell-O Only (No red color; diabetics please choose no sugar options), Clear Sports drink (No red color; diabetics please choose diet or no sugar options), and Plain Popsicles Only (No red color; diabetics please choose no sugar options)    Take these medicines the morning of surgery with A SIP OF WATER                       buspar, levothyroxine , namenda , protonix , lyrica .    Do not wear jewelry, make-up or nail polish, including gel polish,  artificial nails, or any other type of covering on natural nails (fingers and  toes).  Do not wear lotions, powders, or perfumes, or deodorant.  Do not shave 48 hours prior to surgery.  Men may shave face and neck.  Do not bring valuables to the hospital.  [facility] is not responsible for any belongings or valuables.  Contacts, dentures or bridgework may not be worn into surgery.  Leave your suitcase in the car.  After surgery it may be brought to your room.  For patients admitted to the hospital, discharge time will be  determined by your treatment team.  Patients discharged the day of surgery will not be allowed to drive home.    Special instructions:   DO NOT smoke tobacco or vape for 24 hours before your procedure.  Please read over the following fact sheets that you were given. Anesthesia Post-op Instructions

## 2023-09-23 NOTE — Pre-Procedure Instructions (Signed)
 Preop phone call done with [facility]. I also faxed preop diet instructions and all other instructions to them @ [phone].    I left VM for Kayla Clark-legal guardian of [place] of [phone], to call back about signing consent.

## 2023-09-24 NOTE — Pre-Procedure Instructions (Signed)
 Telephone consent obtained from [name], [place] legal guardian @[phone], by MARLA Milroy, RN and SHAUNNA Fuel, RN.

## 2023-09-25 ENCOUNTER — Ambulatory Visit (HOSPITAL_COMMUNITY): Payer: Medicare (Managed Care)

## 2023-09-25 ENCOUNTER — Encounter (HOSPITAL_COMMUNITY): Payer: Self-pay | Admitting: Internal Medicine

## 2023-09-25 ENCOUNTER — Encounter (HOSPITAL_COMMUNITY)
Admission: RE | Disposition: A | Discharge status: Home / Self Care | Payer: Self-pay | Source: Home / Self Care | Attending: Internal Medicine

## 2023-09-25 ENCOUNTER — Ambulatory Visit (HOSPITAL_COMMUNITY)
Admission: RE | Admit: 2023-09-25 | Discharge: 2023-09-25 | Disposition: A | Discharge status: Home / Self Care | Payer: Medicare (Managed Care) | Attending: Internal Medicine | Admitting: Internal Medicine

## 2023-09-25 DIAGNOSIS — K3189 Other diseases of stomach and duodenum: Secondary | ICD-10-CM | POA: Diagnosis not present

## 2023-09-25 DIAGNOSIS — E039 Hypothyroidism, unspecified: Secondary | ICD-10-CM | POA: Insufficient documentation

## 2023-09-25 DIAGNOSIS — K449 Diaphragmatic hernia without obstruction or gangrene: Secondary | ICD-10-CM

## 2023-09-25 DIAGNOSIS — D649 Anemia, unspecified: Secondary | ICD-10-CM | POA: Diagnosis not present

## 2023-09-25 DIAGNOSIS — R1310 Dysphagia, unspecified: Secondary | ICD-10-CM | POA: Diagnosis present

## 2023-09-25 DIAGNOSIS — F32A Depression, unspecified: Secondary | ICD-10-CM | POA: Diagnosis not present

## 2023-09-25 DIAGNOSIS — K219 Gastro-esophageal reflux disease without esophagitis: Secondary | ICD-10-CM | POA: Insufficient documentation

## 2023-09-25 DIAGNOSIS — K6389 Other specified diseases of intestine: Secondary | ICD-10-CM | POA: Diagnosis not present

## 2023-09-25 DIAGNOSIS — D509 Iron deficiency anemia, unspecified: Secondary | ICD-10-CM | POA: Diagnosis not present

## 2023-09-25 DIAGNOSIS — K921 Melena: Secondary | ICD-10-CM | POA: Diagnosis not present

## 2023-09-25 DIAGNOSIS — F419 Anxiety disorder, unspecified: Secondary | ICD-10-CM | POA: Insufficient documentation

## 2023-09-25 DIAGNOSIS — I890 Lymphedema, not elsewhere classified: Secondary | ICD-10-CM

## 2023-09-25 DIAGNOSIS — F418 Other specified anxiety disorders: Secondary | ICD-10-CM

## 2023-09-25 HISTORY — PX: GIVENS CAPSULE STUDY: SHX5432

## 2023-09-25 HISTORY — PX: ESOPHAGOGASTRODUODENOSCOPY: SHX5428

## 2023-09-25 HISTORY — PX: ESOPHAGEAL DILATION: SHX303

## 2023-09-25 SURGERY — EGD (ESOPHAGOGASTRODUODENOSCOPY)
Anesthesia: General

## 2023-09-25 MED ORDER — LACTATED RINGERS IV SOLN: INTRAVENOUS | Status: DC

## 2023-09-25 MED ORDER — LIDOCAINE 2% (20 MG/ML) 5 ML SYRINGE
INTRAMUSCULAR | Status: DC | PRN
  Administered 2023-09-25: 100 mg via INTRAVENOUS

## 2023-09-25 MED ORDER — LACTATED RINGERS IV SOLN: INTRAVENOUS | Status: DC | PRN

## 2023-09-25 MED ORDER — STERILE WATER FOR IRRIGATION IR SOLN
Status: DC | PRN
  Administered 2023-09-25: 60 mL

## 2023-09-25 MED ORDER — PROPOFOL 10 MG/ML IV BOLUS
INTRAVENOUS | Status: DC | PRN
  Administered 2023-09-25 (×3): 50 mg via INTRAVENOUS

## 2023-09-25 NOTE — H&P (Signed)
 @LOGO @   Primary Care Physician:  Isaiah Leisure, MD Primary Gastroenterologist:  Dr. Shaaron  Pre-Procedure History & Physical: HPI:  [name] is a 78 y.o. female here for further evaluation of IDA/melena via EGD with capsule placement.  Also esophageal dysphagia.  Esophageal dilation is feasible/appropriate today per plan.  Most recent hemoglobin normal in the 12 range.  Past Medical History:  Diagnosis Date   Anemia    Anemia    Anxiety    Anxiety and depression    Bacteremia    Cellulitis    Chronic back pain    Chronic diarrhea    Chronic right hip pain    Complication of anesthesia    Depression    DVT (deep venous thrombosis) (HCC)    Dysphagia    GERD (gastroesophageal reflux disease)    Gout    Gout    Headache    Hyperlipidemia    Hypopotassemia    Hypothyroidism    IBS (irritable bowel syndrome)    Major depressive disorder    Muscle weakness (generalized)    Osteoporosis    Pneumonia    PONV (postoperative nausea and vomiting)    Pressure ulcer    Pressure ulcer of buttock    Psychosis (HCC)    Thyroid disease    hypothyroidism    Past Surgical History:  Procedure Laterality Date   BIOPSY  05/10/2023   Procedure: BIOPSY;  Surgeon: Cinderella Deatrice FALCON, MD;  [facility]: [facility];  Service: Endoscopy;;   COLONOSCOPY  2007   Dr. Thomasina: Adenomatous colon polyp   COLONOSCOPY WITH PROPOFOL  N/A 05/11/2023   Procedure: COLONOSCOPY WITH PROPOFOL ;  Surgeon: Cinderella Deatrice FALCON, MD;  Location: [facility];  Service: Endoscopy;  Laterality: N/A;   ESOPHAGOGASTRODUODENOSCOPY N/A 07/09/2016   Procedure: ESOPHAGOGASTRODUODENOSCOPY (EGD);  Surgeon: Lamar CHRISTELLA Shaaron, MD;  Location: [facility];  Service: Endoscopy;  Laterality: N/A;   ESOPHAGOGASTRODUODENOSCOPY (EGD) WITH PROPOFOL  N/A 04/18/2017   Procedure: ESOPHAGOGASTRODUODENOSCOPY (EGD) WITH PROPOFOL ;  Surgeon: Shaaron Lamar CHRISTELLA, MD;  [facility]: [facility];  Service: Endoscopy;  Laterality: N/A;  10:30am    ESOPHAGOGASTRODUODENOSCOPY (EGD) WITH PROPOFOL  N/A 05/10/2023   Procedure: ESOPHAGOGASTRODUODENOSCOPY (EGD) WITH PROPOFOL ;  Surgeon: Cinderella Deatrice FALCON, MD;  [facility]: [facility];  Service: Endoscopy;  Laterality: N/A;   HIP ARTHROPLASTY  05/01/2011   Procedure: right ARTHROPLASTY BIPOLAR HIP;  Surgeon: Taft Minerva, MD;  Location: [facility];  Service: Orthopedics;  Laterality: Right;   INCISION AND DRAINAGE HIP  02/08/2012   Procedure: IRRIGATION AND DEBRIDEMENT HIP;  Surgeon: Taft FORBES Minerva, MD;  Location: [facility];  Service: Orthopedics;  Laterality: Right;   INCISION AND DRAINAGE OF WOUND  05/24/2011   Procedure: IRRIGATION AND DEBRIDEMENT WOUND;  Surgeon: Taft Minerva, MD;  Location: [facility];  Service: Orthopedics;  Laterality: Right;   MALONEY DILATION N/A 04/18/2017   Procedure: AGAPITO DILATION;  Surgeon: Shaaron Lamar CHRISTELLA, MD;  [facility];  Service: Endoscopy;  Laterality: N/A;   POLYPECTOMY  05/11/2023   Procedure: POLYPECTOMY;  Surgeon: Cinderella Deatrice FALCON, MD;  [facility];  Service: Endoscopy;;    Prior to Admission medications   Medication Sig Start Date End Date Taking? Authorizing Provider  atorvastatin  (LIPITOR) 10 MG tablet Take 10 mg by mouth at bedtime.    Yes [provider]  brimonidine (ALPHAGAN) 0.2 % ophthalmic solution Place 1 drop into both eyes 3 (three) times daily.   Yes [provider]  busPIRone (BUSPAR)  5 MG tablet Take by mouth. 05/02/23  Yes [provider]  calcitonin, salmon, (MIACALCIN /FORTICAL) 200 UNIT/ACT nasal spray Place 1 spray into alternate nostrils daily. 05/05/23  Yes [provider]  cholecalciferol  (CHOLECALCIFEROL ) 25 MCG tablet Take 1 tablet (1,000 Units total) by mouth daily. 05/14/23  Yes Johnson, Clanford L, MD  cyanocobalamin  1000 MCG tablet Take 1,000 mcg by mouth daily.   Yes [provider]  dicyclomine  (BENTYL ) 20 MG tablet Take 20 mg by mouth 4 (four) times daily. 06/19/21   Yes [provider]  latanoprost (XALATAN) 0.005 % ophthalmic solution Place 1 drop into both eyes at bedtime.   Yes [provider]  levothyroxine  (SYNTHROID , LEVOTHROID) 50 MCG tablet Take 50 mcg by mouth daily before breakfast.    Yes [provider]  memantine  (NAMENDA ) 10 MG tablet Take 10 mg by mouth 2 (two) times daily.   Yes [provider]  methenamine (HIPREX) 1 g tablet Take 1 g by mouth 2 (two) times daily. 04/09/23  Yes [provider]  OLANZapine  (ZYPREXA ) 10 MG tablet Take 10 mg by mouth at bedtime.   Yes [provider]  pantoprazole  (PROTONIX ) 40 MG tablet Take 40 mg by mouth daily. 04/10/23  Yes [provider]  pregabalin  (LYRICA ) 75 MG capsule Take 75 mg by mouth 3 (three) times daily. 05/31/23  Yes [provider]  senna-docusate (SENOKOT-S) 8.6-50 MG tablet Take 2 tablets by mouth at bedtime. 05/13/23  Yes Johnson, Clanford L, MD  venlafaxine  XR (EFFEXOR -XR) 75 MG 24 hr capsule Take 75 mg by mouth daily. 04/27/23  Yes [provider]  acetaminophen  (TYLENOL ) 325 MG tablet Take 650 mg by mouth every 6 (six) hours as needed for fever.     [provider]  ELIQUIS 5 MG TABS tablet Take 5 mg by mouth 2 (two) times daily. 05/06/23   [provider]  Erenumab -aooe (AIMOVIG ) 140 MG/ML SOAJ Inject 140 mg into the skin every 30 (thirty) days. 11/16/21   Rush Nest, MD  guaiFENesin  (ROBITUSSIN) 100 MG/5ML liquid Take 10 mLs by mouth every 4 (four) hours as needed for cough or to loosen phlegm.    [provider]  LORazepam  (ATIVAN ) 0.5 MG tablet Take 2 tablets (1 mg total) by mouth 2 (two) times daily as needed for anxiety. 05/13/23   Johnson, Clanford L, MD  Vaginal Lubricant (REPLENS) GEL Place 1 Application vaginally at bedtime.    [provider]    Allergies as of 08/08/2023 - Review Complete 08/02/2023  Allergen Reaction Noted   Diphenhydramine  Other (See Comments)  04/30/2011   Tape Dermatitis 11/20/2013    Family History  Problem Relation Age of Onset   Migraines Mother     Social History   Socioeconomic History   Marital status: Single    Spouse name: Not on file   Number of children: Not on file   Years of education: Not on file   Highest education level: Not on file  Occupational History   Not on file  Tobacco Use   Smoking status: Never   Smokeless tobacco: Never  Vaping Use   Vaping status: Never Used  Substance and Sexual Activity   Alcohol  use: No   Drug use: No   Sexual activity: Never  Other Topics Concern   Not on file  Social History Narrative   03/16/21 lives at [facility]   Patient's legal guardian is Mr. Renay Louder. Phone number [phone].   Social Drivers  of Health   Financial Resource Strain: Not on file  Food Insecurity: No Food Insecurity (05/08/2023)   Hunger Vital Sign    Worried About Running Out of Food in the Last Year: Never true    Ran Out of Food in the Last Year: Never true  Transportation Needs: No Transportation Needs (05/08/2023)   PRAPARE - Administrator, Civil Service (Medical): No    Lack of Transportation (Non-Medical): No  Physical Activity: Not on file  Stress: Not on file  Social Connections: Patient Unable To Answer (05/09/2023)   Social Connection and Isolation Panel    Frequency of Communication with Friends and Family: Patient unable to answer    Frequency of Social Gatherings with Friends and Family: Patient unable to answer    Attends Religious Services: Patient unable to answer    Active Member of Clubs or Organizations: Patient unable to answer    Attends Banker Meetings: Patient unable to answer    Marital Status: Patient unable to answer  Intimate Partner Violence: Not At Risk (05/08/2023)   Humiliation, Afraid, Rape, and Kick questionnaire    Fear of Current or Ex-Partner: No    Emotionally Abused: No    Physically Abused: No     Sexually Abused: No    Review of Systems: See HPI, otherwise negative ROS  Physical Exam: BP (!) 136/53 (BP Location: Left Arm)   Temp 97.9 F (36.6 C)   Resp 17   SpO2 97%  General:   Alert,  Well-developed, well-nourished, pleasant and cooperative in NAD Neck:  Supple; no masses or thyromegaly. No significant cervical adenopathy. Lungs:  Clear throughout to auscultation.   No wheezes, crackles, or rhonchi. No acute distress. Heart:  Regular rate and rhythm; no murmurs, clicks, rubs,  or gallops. Abdomen: Non-distended, normal bowel sounds.  Soft and nontender without appreciable mass or hepatosplenomegaly.  Pulses:  Normal pulses noted. Extremities:  Without clubbing or edema.  Impression/Plan: 78 year old lady with.  Presenting with melena and profound IDA earlier in the year.  EGD and colonoscopy findings failed to yield an adequate explanation for degree of anemia/melena.  She also has esophageal dysphagia.  She is here for EGD with esophageal dilation as feasible/appropriate with dilation as well as a capsule placement.  The risks, benefits, limitations, alternatives and imponderables have been reviewed with the patient. Potential for esophageal dilation, biopsy, etc. have also been reviewed.  Questions have been answered. All parties agreeable.   Eliquis held 6/29.     Notice: This dictation was prepared with Dragon dictation along with smaller phrase technology. Any transcriptional errors that result from this process are unintentional and may not be corrected upon review.

## 2023-09-25 NOTE — Transfer of Care (Signed)
 Immediate Anesthesia Transfer of Care Note  Patient: [name]  Procedure(s) Performed: EGD (ESOPHAGOGASTRODUODENOSCOPY) DILATION, ESOPHAGUS IMAGING PROCEDURE, GI TRACT, INTRALUMINAL, VIA CAPSULE  Patient Location: Endoscopy Unit  Anesthesia Type:General  Level of Consciousness: awake, alert , and patient cooperative  Airway & Oxygen Therapy: Patient Spontanous Breathing  Post-op Assessment: Report given to RN, Post -op Vital signs reviewed and stable, and Patient moving all extremities X 4  Post vital signs: Reviewed and stable  Last Vitals:  Vitals Value Taken Time  BP 107/58 09/25/23 08:53  Temp 36.4 C 09/25/23 08:53  Pulse 63 09/25/23 08:53  Resp 14 09/25/23 08:53  SpO2 94 % 09/25/23 08:53    Last Pain:  Vitals:   09/25/23 0853  TempSrc: Oral  PainSc: 0-No pain         Complications: No notable events documented.

## 2023-09-25 NOTE — Anesthesia Postprocedure Evaluation (Signed)
 Anesthesia Post Note  Patient: [name]  Procedure(s) Performed: EGD (ESOPHAGOGASTRODUODENOSCOPY) DILATION, ESOPHAGUS IMAGING PROCEDURE, GI TRACT, INTRALUMINAL, VIA CAPSULE  Patient location during evaluation: PACU Anesthesia Type: General Level of consciousness: awake and alert Pain management: pain level controlled Vital Signs Assessment: post-procedure vital signs reviewed and stable Respiratory status: spontaneous breathing, nonlabored ventilation, respiratory function stable and patient connected to nasal cannula oxygen Cardiovascular status: stable and blood pressure returned to baseline Postop Assessment: no apparent nausea or vomiting Anesthetic complications: no   No notable events documented.   Last Vitals:  Vitals:   09/25/23 0725 09/25/23 0853  BP: (!) 136/53 (!) 107/58  Pulse:  63  Resp: 17 14  Temp: 36.6 C (!) 36.4 C  SpO2: 97% 94%    Last Pain:  Vitals:   09/25/23 0853  TempSrc: Oral  PainSc: 0-No pain                 Andrea Limes

## 2023-09-25 NOTE — Discharge Instructions (Signed)
 EGD Discharge instructions Please read the instructions outlined below and refer to this sheet in the next few weeks. These discharge instructions provide you with general information on caring for yourself after you leave the hospital. Your doctor may also give you specific instructions. While your treatment has been planned according to the most current medical practices available, unavoidable complications occasionally occur. If you have any problems or questions after discharge, please call your doctor. ACTIVITY You may resume your regular activity but move at a slower pace for the next 24 hours.  Take frequent rest periods for the next 24 hours.  Walking will help expel (get rid of) the air and reduce the bloated feeling in your abdomen.  No driving for 24 hours (because of the anesthesia (medicine) used during the test).  You may shower.  Do not sign any important legal documents or operate any machinery for 24 hours (because of the anesthesia used during the test).  NUTRITION Drink plenty of fluids.  You may resume your normal diet.  Begin with a light meal and progress to your normal diet.  Avoid alcoholic beverages for 24 hours or as instructed by your caregiver.  MEDICATIONS You may resume your normal medications unless your caregiver tells you otherwise.  WHAT YOU CAN EXPECT TODAY You may experience abdominal discomfort such as a feeling of fullness or "gas" pains.  FOLLOW-UP Your doctor will discuss the results of your test with you.  SEEK IMMEDIATE MEDICAL ATTENTION IF ANY OF THE FOLLOWING OCCUR: Excessive nausea (feeling sick to your stomach) and/or vomiting.  Severe abdominal pain and distention (swelling).  Trouble swallowing.  Temperature over 101 F (37.8 C).  Rectal bleeding or vomiting of blood.    Hiatal hernia found otherwise upper GI tract appeared normal.  Esophagus stretched today  Small bowel video capsule placed.  Recommend post capsule deployment  diet  Further recommendations to follow after capsule data has been reviewed.

## 2023-09-25 NOTE — Anesthesia Preprocedure Evaluation (Addendum)
 Anesthesia Evaluation  Patient identified by MRN, date of birth, ID band Patient confused  General Assessment Comment:Unable to assess when the patient last ate or drank.SABRAattempting to contact nursing home  Reviewed: Allergy & Precautions, H&P , NPO status , Patient's Chart, lab work & pertinent test results  History of Anesthesia Complications (+) PONV and history of anesthetic complications  Airway Mallampati: II  TM Distance: >3 FB Neck ROM: Full    Dental  (+) Edentulous Upper, Edentulous Lower   Pulmonary pneumonia   Pulmonary exam normal breath sounds clear to auscultation       Cardiovascular negative cardio ROS Normal cardiovascular exam Rhythm:Regular Rate:Normal     Neuro/Psych  Headaches PSYCHIATRIC DISORDERS Anxiety Depression       GI/Hepatic Neg liver ROS,GERD  ,,  Endo/Other  Hypothyroidism    Renal/GU negative Renal ROS  negative genitourinary   Musculoskeletal negative musculoskeletal ROS (+)    Abdominal   Peds negative pediatric ROS (+)  Hematology  (+) Blood dyscrasia, anemia   Anesthesia Other Findings   Reproductive/Obstetrics negative OB ROS                              Anesthesia Physical Anesthesia Plan  ASA: 3  Anesthesia Plan: General   Post-op Pain Management:    Induction: Intravenous  PONV Risk Score and Plan:   Airway Management Planned: Nasal Cannula  Additional Equipment:   Intra-op Plan:   Post-operative Plan:   Informed Consent: I have reviewed the patients History and Physical, chart, labs and discussed the procedure including the risks, benefits and alternatives for the proposed anesthesia with the patient or authorized representative who has indicated his/her understanding and acceptance.     Dental advisory given  Plan Discussed with: CRNA  Anesthesia Plan Comments:          Anesthesia Quick Evaluation

## 2023-09-25 NOTE — Op Note (Signed)
 [facility] Patient Name: [name] Procedure Date: 09/25/2023 7:59 AM MRN: [id number] Date of Birth: [birth year] Attending MD: Terri Ozell Hollingshead , MD, [id number] CSN: [id number] Age: 78 Admit Type: Outpatient Procedure:                Upper GI endoscopy Indications:              Dysphagia/melena/IDA Providers:                Terri Ozell Hollingshead, MD, Crystal Page, Bascom Blush Referring MD:              Medicines:                Propofol  per Anesthesia Complications:            No immediate complications. Estimated Blood Loss:     Estimated blood loss was minimal. Procedure:                Pre-Anesthesia Assessment:                           - Prior to the procedure, a History and Physical                            was performed, and patient medications and                            allergies were reviewed. The patient's tolerance of                            previous anesthesia was also reviewed. The risks                            and benefits of the procedure and the sedation                            options and risks were discussed with the patient.                            All questions were answered, and informed consent                            was obtained. ASA Grade Assessment: III - A patient                            with severe systemic disease. After reviewing the                            risks and benefits, the patient was deemed in                            satisfactory condition to undergo the procedure.                           After obtaining informed consent, the endoscope was  passed under direct vision. Throughout the                            procedure, the patient's blood pressure, pulse, and                            oxygen saturations were monitored continuously. The                            [id number]-H190 ([id number]) scope was introduced through the                            mouth, and advanced to the second  part of duodenum.                            The upper GI endoscopy was accomplished without                            difficulty. The patient tolerated the procedure                            well. Scope In: 8:33:22 AM Scope Out: 8:46:57 AM Total Procedure Duration: 0 hours 13 minutes 35 seconds  Findings:      The examined esophagus was normal.      A medium-sized hiatal hernia was present. Stomach otherwise appeared       normal.      The duodenal bulb and second portion of the duodenum were normal. The       scope was withdrawn. Dilation was performed with a Maloney dilator with       mild resistance at 54 Fr. The dilation site was examined following       endoscope reinsertion and showed no change. Estimated blood loss: none.       Scope withdrawn. Given capsule deployment device loaded up scope       reintroduced into the stomach the device was passed across the pylorus       and ED capsule was launched into the duodenum under endoscopic       visualization. Impression:               - Normal esophagus. Dilated.                           - Medium-sized hiatal hernia.                           - Normal duodenal bulb and second portion of the                            duodenum.                           - No specimens collected. Small bowel capsule                            deployed Moderate Sedation:      Moderate (conscious) sedation was personally administered by an  anesthesia professional. The following parameters were monitored: oxygen       saturation, heart rate, blood pressure, respiratory rate, EKG, adequacy       of pulmonary ventilation, and response to care. Recommendation:           - Patient has a contact number available for                            emergencies. The signs and symptoms of potential                            delayed complications were discussed with the                            patient. Return to normal activities tomorrow.                             Written discharge instructions were provided to the                            patient.                           - Advance diet as tolerated. Follow-up results as                            they become available. Resume Eliquis today. Procedure Code(s):        --- Professional ---                           [phone], Esophagogastroduodenoscopy, flexible,                            transoral; diagnostic, including collection of                            specimen(s) by brushing or washing, when performed                            (separate procedure)                           43450, Dilation of esophagus, by unguided sound or                            bougie, single or multiple passes Diagnosis Code(s):        --- Professional ---                           K44.9, Diaphragmatic hernia without obstruction or                            gangrene                           R13.10, Dysphagia, unspecified CPT copyright 2022 American Medical Association. All rights reserved. The codes documented  in this report are preliminary and upon coder review may  be revised to meet current compliance requirements. [name]. Terri Stjames, MD Terri Ozell Hollingshead, MD 09/25/2023 8:58:15 AM This report has been signed electronically. Number of Addenda: 0

## 2023-09-26 ENCOUNTER — Encounter (HOSPITAL_COMMUNITY): Payer: Self-pay | Admitting: Internal Medicine

## 2023-10-18 ENCOUNTER — Telehealth: Payer: Self-pay | Admitting: Gastroenterology

## 2023-10-18 DIAGNOSIS — K921 Melena: Secondary | ICD-10-CM

## 2023-10-18 DIAGNOSIS — D509 Iron deficiency anemia, unspecified: Secondary | ICD-10-CM

## 2023-10-18 NOTE — Telephone Encounter (Signed)
 I reviewed patient's capsule with Dr. Shaaron. Please let pt know findings and recommendations as discussed with Dr. Shaaron.   Complete small bowel study. No active bleeding noted. There were a couple of nonbleeding erosions. There were several vascular areas seen, one with what appeared to have some disruption of the mucosa, could potentially have contributed to bleeding.     CT enterography, to further evaluate vascular area, rule out underlying mass.  Continue pantoprazole  40mg  daily before breakfast.  Recommend no NSAIDs. Continue to have anemia followed with hematology.    Please schedule CT enterography.

## 2023-10-18 NOTE — Op Note (Addendum)
  Small Bowel Givens Capsule Study Procedure date:  09/25/2023  Referring Provider:  Ozell Hollingshead, MD  PCP:  Dr. Isaiah Leisure, MD  Indication for procedure:  History of obscure GI bleeding with profound anemia/melena in the setting of Eliquis. Presenting with Hgb of 5.7 in 04/2023. EGD and colonoscopy at that time without findings to explain degree of anemia or melena. She was noted to have 6cm hiatal hernia, gastritis, and 3 polyps in the colon which were removed. Small bowel capsule study performed to rule out small bowel source for bleeding.    Patient data:  Wt: 77.1kg Ht: 5'7  Findings: Capsule placed with endoscopy. Small bowel passage time of 4h 58m, capsule reached colon. Prep was good, mucosa well seen. There was small nonbleeding erosions noted at 14m53s and 1h30m. There were several vascular areas seen at 1h5m19s, 1h44m13s, 1h9m8s, 2h90m19s. The lesion vascular area at 1h52m19s appeared to have some disruption of the mucosa, unclear significant but cannot rule out possible source for bleeding. Few benign lymphangiectasias noted, for example at 1h11m31s. Lipomatous appearing lesion noted at 2h26m41s.   First Gastric image:  N/A. Capsule placed endoscopically First Duodenal image: 70m8s First Ileo-Cecal Valve image: 4h9m36s First Cecal image: 4h25m36s Gastric Passage time: N/A Small Bowel Passage time:  4h80m  Summary & Recommendations:  Complete small bowel study. No active bleeding noted. There were a couple of nonbleeding erosions. There were several vascular areas seen, one with what appeared to have some disruption of the mucosa, could potentially have contributed to bleeding. I have showed pertinent images to Dr. Hollingshead. Patient needs CTE for further evaluation, to further evaluate vascular area, rule out underlying mass.   CT enterography.. Continue pantoprazole  40mg  daily before breakfast.  Recommend no NSAIDs. Continue to have anemia followed with hematology.    Sonny RAMAN. Ezzard RIGGERS [facility] [phone] 7/25/20251:18 PM  Attending note: Pertinent images reviewed.  Agree with summary and recommendations.

## 2023-10-21 NOTE — Telephone Encounter (Signed)
 Olam at [facility] was made aware and verbalized understanding. Was informed that someone in scheduling will reach out to facility to schedule CT.   Mandy: please fax copy of small bowel study to [facility] at [phone]

## 2023-10-22 NOTE — Addendum Note (Signed)
 Addended by: JEANELL GRAEME RAMAN on: 10/22/2023 09:39 AM   Modules accepted: Orders

## 2023-10-22 NOTE — Telephone Encounter (Signed)
 Called [facility] and was advised I needed to fax clinicals to [phone]

## 2023-10-29 NOTE — Telephone Encounter (Addendum)
 PA approved. Auth# [id number] exp 01/20/2024  Appt scheduled. Called facility no answer. I have also sent fax with appt information

## 2023-11-01 ENCOUNTER — Inpatient Hospital Stay: Payer: Medicare (Managed Care) | Attending: Oncology

## 2023-11-01 DIAGNOSIS — R053 Chronic cough: Secondary | ICD-10-CM | POA: Diagnosis not present

## 2023-11-01 DIAGNOSIS — K259 Gastric ulcer, unspecified as acute or chronic, without hemorrhage or perforation: Secondary | ICD-10-CM | POA: Insufficient documentation

## 2023-11-01 DIAGNOSIS — K5909 Other constipation: Secondary | ICD-10-CM | POA: Diagnosis not present

## 2023-11-01 DIAGNOSIS — D500 Iron deficiency anemia secondary to blood loss (chronic): Secondary | ICD-10-CM | POA: Insufficient documentation

## 2023-11-01 DIAGNOSIS — K449 Diaphragmatic hernia without obstruction or gangrene: Secondary | ICD-10-CM | POA: Diagnosis not present

## 2023-11-01 DIAGNOSIS — F32A Depression, unspecified: Secondary | ICD-10-CM | POA: Insufficient documentation

## 2023-11-01 LAB — COMPREHENSIVE METABOLIC PANEL WITH GFR
ALT: 12 U/L (ref 0–44)
AST: 16 U/L (ref 15–41)
Albumin: 3.6 g/dL (ref 3.5–5.0)
Alkaline Phosphatase: 105 U/L (ref 38–126)
Anion gap: 12 (ref 5–15)
BUN: 15 mg/dL (ref 8–23)
CO2: 28 mmol/L (ref 22–32)
Calcium: 9.3 mg/dL (ref 8.9–10.3)
Chloride: 106 mmol/L (ref 98–111)
Creatinine, Ser: 0.61 mg/dL (ref 0.44–1.00)
GFR, Estimated: >60 mL/min (ref >60)
Glucose, Bld: 114 mg/dL — ABNORMAL HIGH (ref 70–99)
Potassium: 3.5 mmol/L (ref 3.5–5.1)
Sodium: 146 mmol/L — ABNORMAL HIGH (ref 135–145)
Total Bilirubin: 0.7 mg/dL (ref 0.0–1.2)
Total Protein: 6.6 g/dL (ref 6.5–8.1)

## 2023-11-01 LAB — CBC WITH DIFFERENTIAL/PLATELET
Abs Immature Granulocytes: 0.02 K/uL (ref 0.00–0.07)
Basophils Absolute: 0 K/uL (ref 0.0–0.1)
Basophils Relative: 1 %
Eosinophils Absolute: 0.1 K/uL (ref 0.0–0.5)
Eosinophils Relative: 3 %
HCT: 39.7 % (ref 36.0–46.0)
Hemoglobin: 12.3 g/dL (ref 12.0–15.0)
Immature Granulocytes: 0 %
Lymphocytes Relative: 23 %
Lymphs Abs: 1.1 K/uL (ref 0.7–4.0)
MCH: 28.5 pg (ref 26.0–34.0)
MCHC: 31 g/dL (ref 30.0–36.0)
MCV: 91.9 fL (ref 80.0–100.0)
Monocytes Absolute: 0.3 K/uL (ref 0.1–1.0)
Monocytes Relative: 7 %
Neutro Abs: 3.2 K/uL (ref 1.7–7.7)
Neutrophils Relative %: 66 %
Platelets: 228 K/uL (ref 150–400)
RBC: 4.32 MIL/uL (ref 3.87–5.11)
RDW: 15.2 % (ref 11.5–15.5)
WBC: 4.9 K/uL (ref 4.0–10.5)
nRBC: 0 % (ref 0.0–0.2)

## 2023-11-01 LAB — IRON AND TIBC
Iron: 54 ug/dL (ref 28–170)
Saturation Ratios: 16 % (ref 10.4–31.8)
TIBC: 348 ug/dL (ref 250–450)
UIBC: 294 ug/dL

## 2023-11-01 LAB — FERRITIN: Ferritin: 18 ng/mL (ref 11–307)

## 2023-11-01 LAB — VITAMIN B12: Vitamin B-12: 405 pg/mL (ref 180–914)

## 2023-11-01 LAB — FOLATE: Folate: 10.8 ng/mL (ref >5.9)

## 2023-11-07 ENCOUNTER — Inpatient Hospital Stay (HOSPITAL_BASED_OUTPATIENT_CLINIC_OR_DEPARTMENT_OTHER): Payer: Medicare (Managed Care) | Admitting: Oncology

## 2023-11-07 VITALS — BP 131/65 | HR 65 | Temp 96.7°F | Resp 20

## 2023-11-07 DIAGNOSIS — D500 Iron deficiency anemia secondary to blood loss (chronic): Secondary | ICD-10-CM

## 2023-11-07 NOTE — Progress Notes (Signed)
   [name] OFFICE PROGRESS NOTE  Terri Leisure, MD  ASSESSMENT & PLAN:    Assessment & Plan Iron  deficiency anemia due to chronic blood loss Iron  deficiency anemia likely secondary to [facility] lesions and hiatal hernia.   Was recently admitted to the hospital requiring blood transfusions.   S/p IV iron  infusion with significant improvement Resolved anemia and iron  deficiency at this time  Patient does have chronic constipation and uses pain medication.  Will not recommend oral iron  supplementation Recommended to eat healthy with proteins and greens Continue to follow with GI  Return to clinic in 3 months with labs  No orders of the defined types were placed in this encounter.   INTERVAL HISTORY: Patient returns for recurrent anemia.   In the interim she had a upper endoscopy on 09/25/23 which showed normal esophagus, medium size hiatal hernia and normal duodenal bulb and second portion of the duodenum.  No specimens were collected.  Most recent colonoscopy is from 05/11/2023 which showed three 7 to 9 mm polyps in sigmoid colon, diverticulosis and nonbleeding external and internal hemorrhoids.  Due to [facility] lesions in setting of large hiatal hernia.  She is currently on Protonix .  Patient tolerated 1 g Monoferric  well on 06/10/2023.  She is currently not on oral iron  secondary to constipation.  Reports improvement of her energy levels following iron  infusion although they have decreased some over the past few months.  Reports an appetite of 65% energy levels are low.  She has a chronic cough which is stable.  Has occasional diarrhea.  Has dizziness with headaches at times especially when she stands.  Has stable chronic depression.  We reviewed CBC, CMP, iron  panel, vitamin B12, folate and ferritin.  SUMMARY OF HEMATOLOGIC HISTORY: Iron  deficient anemia likely secondary to GI bleeding - S/p IV iron -Monoferric  1000 mg on 06/10/2023 - Endoscopic colonoscopy consistent  with a probable [place] lesion  Lab Results  Component Value Date   HGB 12.3 11/01/2023   FERRITIN 18 11/01/2023   VITAMINB12 405 11/01/2023    Vitals:   11/07/23 1047  BP: 131/65  Pulse: 65  Resp: 20  Temp: (!) 96.7 F (35.9 C)  SpO2: 98%    I spent 25 minutes dedicated to the care of this patient (face-to-face and non-face-to-face) on the date of the encounter to include what is described in the assessment and plan.,  Delon Hope, NP 11/07/2023 11:03 AM

## 2023-11-07 NOTE — Assessment & Plan Note (Addendum)
 Iron deficiency anemia likely secondary to [facility] lesions and hiatal hernia.   Was recently admitted to the hospital requiring blood transfusions.   S/p IV iron infusion with significant improvement Resolved anemia and iron deficiency at this time  - Patient does have chronic constipation and uses pain medication.  Will not recommend oral iron supplementation - Recommended to eat healthy with proteins and greens - Continue to follow with GI  Return to clinic in 3 months with labs

## 2023-11-08 ENCOUNTER — Ambulatory Visit: Payer: Medicare (Managed Care) | Admitting: Oncology

## 2023-11-14 ENCOUNTER — Inpatient Hospital Stay: Payer: Medicare (Managed Care)

## 2023-11-14 VITALS — BP 148/64 | HR 60 | Temp 97.7°F | Resp 18

## 2023-11-14 DIAGNOSIS — D500 Iron deficiency anemia secondary to blood loss (chronic): Secondary | ICD-10-CM

## 2023-11-14 MED ORDER — CETIRIZINE HCL 10 MG/ML IV SOLN
10.0000 mg | Freq: Once | INTRAVENOUS | Status: AC
  Administered 2023-11-14: 10 mg via INTRAVENOUS
  Filled 2023-11-14: qty 1

## 2023-11-14 MED ORDER — ACETAMINOPHEN 325 MG PO TABS
650.0000 mg | ORAL_TABLET | Freq: Once | ORAL | Status: AC
  Administered 2023-11-14: 650 mg via ORAL
  Filled 2023-11-14: qty 2

## 2023-11-14 MED ORDER — SODIUM CHLORIDE 0.9 % IV SOLN
50.0000 mg | Freq: Once | INTRAVENOUS | Status: AC
  Administered 2023-11-14: 50 mg via INTRAVENOUS
  Filled 2023-11-14: qty 1

## 2023-11-14 MED ORDER — SODIUM CHLORIDE 0.9 % IV SOLN: INTRAVENOUS | Status: DC

## 2023-11-14 MED ORDER — FAMOTIDINE IN NACL 20-0.9 MG/50ML-% IV SOLN
20.0000 mg | Freq: Once | INTRAVENOUS | Status: AC
Start: 2023-11-14 — End: 2023-11-14
  Administered 2023-11-14: 20 mg via INTRAVENOUS
  Filled 2023-11-14: qty 50

## 2023-11-14 MED ORDER — METHYLPREDNISOLONE SODIUM SUCC 125 MG IJ SOLR
125.0000 mg | Freq: Once | INTRAMUSCULAR | Status: AC
  Administered 2023-11-14: 125 mg via INTRAVENOUS
  Filled 2023-11-14: qty 2

## 2023-11-14 MED ORDER — SODIUM CHLORIDE 0.9 % IV SOLN
950.0000 mg | Freq: Once | INTRAVENOUS | Status: AC
  Administered 2023-11-14: 950 mg via INTRAVENOUS
  Filled 2023-11-14: qty 19

## 2023-11-14 NOTE — Progress Notes (Signed)
 Patient tolerated iron  infusion with no complaints voiced.  Peripheral IV site clean and dry with good blood return noted before and after infusion. Pt observed for 30 minutes post iron  infusion without any complication. Gauze and coban dressing applied. Transportation called for pickup.  VSS with discharge and left in satisfactory condition with no s/s of distress noted. Pt escorted out via wheelchair to be driven back to facility by transportation. All follow ups as scheduled.   Jadriel Saxer

## 2023-11-14 NOTE — Patient Instructions (Signed)
 Iron Dextran Injection What is this medication? IRON DEXTRAN (EYE ern DEX tran) treats low levels of iron in your body. Iron is a mineral that plays an important role in making red blood cells, which carry oxygen from your lungs to the rest of your body. This medicine may be used for other purposes; ask your health care provider or pharmacist if you have questions. COMMON BRAND NAME(S): Dexferrum, INFeD What should I tell my care team before I take this medication? They need to know if you have any of these conditions: Anemia not caused by low iron levels Heart disease High levels of iron in the blood Kidney disease Liver disease An unusual or allergic reaction to iron, other medications, foods, dyes, or preservatives Pregnant or trying to get pregnant Breastfeeding How should I use this medication? This medication is injected into a vein or a muscle. It is given by your care team in a hospital or clinic setting. Talk to your care team about the use of this medication in children. While it may be prescribed for children as young as 4 months for selected conditions, precautions do apply. Overdosage: If you think you have taken too much of this medicine contact a poison control center or emergency room at once. NOTE: This medicine is only for you. Do not share this medicine with others. What if I miss a dose? Keep appointments for follow-up doses. It is important not to miss your dose. Call your care team if you are unable to keep an appointment. What may interact with this medication? Do not take this medication with any of the following: Deferoxamine Dimercaprol Other iron products This medication may also interact with the following: Chloramphenicol Deferasirox This list may not describe all possible interactions. Give your health care provider a list of all the medicines, herbs, non-prescription drugs, or dietary supplements you use. Also tell them if you smoke, drink alcohol, or use  illegal drugs. Some items may interact with your medicine. What should I watch for while using this medication? Visit your care team for regular checks on your progress. Tell your care team if your symptoms do not start to get better or if they get worse. You may need blood work while taking this medication. You may need to eat more foods that contain iron. Talk to your care team. Foods that contain iron include whole grains/cereals, dried fruits, beans, peas, leafy green vegetables, and organ meats (liver, kidney). Long-term use of this medication may increase your risk of some cancers. Talk to your care team about your risk of cancer. What side effects may I notice from receiving this medication? Side effects that you should report to your care team as soon as possible: Allergic reactions--skin rash, itching, hives, swelling of the face, lips, tongue, or throat Low blood pressure--dizziness, feeling faint or lightheaded, blurry vision Shortness of breath Side effects that usually do not require medical attention (report to your care team if they continue or are bothersome): Flushing Headache Joint pain Muscle pain Nausea Pain, redness, or irritation at injection site This list may not describe all possible side effects. Call your doctor for medical advice about side effects. You may report side effects to FDA at 1-800-FDA-1088. Where should I keep my medication? This medication is given in a hospital or clinic. It will not be stored at home. NOTE: This sheet is a summary. It may not cover all possible information. If you have questions about this medicine, talk to your doctor, pharmacist, or health  care provider.  2024 Elsevier/Gold Standard (2022-10-31 00:00:00)

## 2023-11-21 NOTE — Progress Notes (Signed)
 Chief Complaint  Patient presents with   RM1/MIGRAINES    Pt is here with her caretaker. Pt state she has sharp pains in her head. Pt states that she has headaches everyday. Pt states she has nausea with her headaches.     HISTORY OF PRESENT ILLNESS:  11/26/23 ALL:  [name] is a 78 y.o. female here today for follow up for headaches and hx meningioma. Last MRI 07/2022 showed 6mm stable meningioma. She was originally seen by Dr Rush 03/16/2021 and Lyrica  dose was adjusted. Amovig was started at follow up 05/2021 and increased to 140mg  10/2021. She saw Dr Buck 01/2023 who recommended she request a referral to an academic headache center.   Since, she tells tells me that she is having daily headaches. Describes a daily throbbing. Occasionally stabbing pain. Has light sensitivity and intermittent nausea. Quite room helps. She reports getting a headache shot at SNF. She does not feel it helps. Unclear what is used for abortive therapy but likely Tylenol . Also taking venlafaxine , pregabalin , memantine  and melatonin through PCP. These medications can be used for migraine prevention. She does not know if referral was placed to academic center at last visit.   She lives at [facility]. Has PCP with facility. Med list reports atorvastatin  and Eliquis.   PCP: Evalene Senegal [facility] NP: Joen Gaskins   She presents with Virl, medical records and transportation assistant with facility.   Prior Therapies  Amovig 140mg  monthly                                 Depakote 250 mg TID Gabapentin  300 mg TID Cymbalta 30 mg daily Lyrica  75/50/75 mg TID - drowsiness Tramadol  50 mg TID  HISTORY (copied from Dr Obie previous note)  Ms. [name] is a 78 year old female with an underlying complex medical history of anemia, cellulitis, chronic back pain, history of DVT, reflux disease, gout, hypothyroidism, hyperlipidemia, irritable bowel syndrome, anxiety, depression, and recurrent  headaches, who presents for follow-up consultation of her headaches.  The patient is accompanied by NH staff member/transportation today. She was previously followed by Dr. Delon Rush and was last seen by Dr. Rush on 11/14/2021.  I reviewed the office visit note and copied the note below for reference.  She was advised to continue with Aimovig  injections, the dose was increased 240 mg monthly.  She was advised to proceed with a brain MRI.  Her Lyrica  was continued at the current dose of 75 mg in the morning, 50 mg midday, and 75 mg in the evening.   She had a brain MRI with and without contrast on 07/27/2022 and I reviewed the results:   IMPRESSION: 1. Unchanged size and appearance of a 6 mm meningioma overlying the mid right frontal lobe. 2. No acute intracranial process.   The patient was notified of the test results through a phone call to her nursing home.   Today, 02/13/2023: She reports recurrent headaches, she reports nearly daily sharp headaches, they are primarily in the front, on both sides, no nausea or vomiting typically.  She does not typically have light sensitivity but sometimes.  She does not provide much in the way of details, does not sound like she has throbbing headaches or one-sided headaches or associated neurological accompaniments.  She has a lot of medications, she is on multiple potentially sedating medications, she takes Seroquel, Ativan , Zyprexa , Lyrica , Celexa , trazodone .  She is  on Aimovig  injections 140 mg every 30 days.  She has not fallen recently but reports that she cannot stand or walk.  She is in a wheelchair today.  She does not drink a whole lot of water , she is not sure how much water  she drinks at all.  She likes to drink caffeine in the form of coffee, about 2 cups/day, 1 serving of tea daily and 2 bottles of soda daily.  Records from her nursing home including [facility] were reviewed, nursing home consultation sheet was filled out.   Previously:   11/14/2021 (Dr.  Rush): << CC:  headaches   Follow-up Visit   Last visit: 06/21/21   Brief HPI: 78 year old female with a history of depression with psychosis, hypothyroidism, dementia (on namenda ) who follows in clinic for chronic daily headaches.   At her last visit she was started on Aimovig  70 mg monthly. Lyrica  was continued at 75/50/75.   Interval History: She continues to have daily headaches. They can be bifrontal or occipital and are associated with phonophobia. She continues to have frequent neck pain. She is a poor historian and comes alone today.   Headache days per month: 30 Headache free days per month: 0   Current Headache Regimen: Preventative: Lyrica  50/75/50, Aimovig  70 mg monthly Abortive: Tylenol , Tramadol  (from outside provider)   Prior Therapies                                  Depakote 250 mg TID Gabapentin  300 mg TID Cymbalta 30 mg daily Lyrica  75/50/75 mg TID - drowsiness Tramadol  50 mg TID Aimovig  70 mg   REVIEW OF SYSTEMS: Out of a complete 14 system review of symptoms, the patient complains only of the following symptoms, headaches, chronic pain, memory loss, and all other reviewed systems are negative.   ALLERGIES: Allergies  Allergen Reactions   Diphenhydramine  Other (See Comments)    Feels bad   Tape Dermatitis     HOME MEDICATIONS: Outpatient Medications Prior to Visit  Medication Sig Dispense Refill   acetaminophen  (TYLENOL ) 325 MG tablet Take 650 mg by mouth every 6 (six) hours as needed for fever.      atorvastatin  (LIPITOR) 10 MG tablet Take 10 mg by mouth at bedtime.      brimonidine (ALPHAGAN) 0.2 % ophthalmic solution Place 1 drop into both eyes 3 (three) times daily.     busPIRone (BUSPAR) 5 MG tablet Take 5 mg by mouth 2 (two) times daily.     calcitonin, salmon, (MIACALCIN /FORTICAL) 200 UNIT/ACT nasal spray Place 1 spray into alternate nostrils daily.     cholecalciferol  (CHOLECALCIFEROL ) 25 MCG tablet Take 1 tablet (1,000 Units total) by  mouth daily.     cyanocobalamin  1000 MCG tablet Take 1,000 mcg by mouth daily.     dicyclomine  (BENTYL ) 20 MG tablet Take 20 mg by mouth 4 (four) times daily.     ELIQUIS 5 MG TABS tablet Take 5 mg by mouth 2 (two) times daily.     HYDROcodone -acetaminophen  (NORCO/VICODIN) 5-325 MG tablet Take 1 tablet by mouth every 12 (twelve) hours as needed for moderate pain (pain score 4-6).     latanoprost (XALATAN) 0.005 % ophthalmic solution Place 1 drop into both eyes at bedtime.     levothyroxine  (SYNTHROID , LEVOTHROID) 50 MCG tablet Take 50 mcg by mouth daily before breakfast.      loperamide  (IMODIUM ) 2 MG capsule Take 2 mg  by mouth as needed for diarrhea or loose stools.     LORazepam  (ATIVAN ) 0.5 MG tablet Take 2 tablets (1 mg total) by mouth 2 (two) times daily as needed for anxiety. 10 tablet 0   melatonin 3 MG TABS tablet Take 3 mg by mouth at bedtime.     memantine  (NAMENDA ) 10 MG tablet Take 10 mg by mouth 2 (two) times daily.     methenamine (HIPREX) 1 g tablet Take 1 g by mouth 2 (two) times daily.     OLANZapine  (ZYPREXA ) 10 MG tablet Take 10 mg by mouth at bedtime.     pantoprazole  (PROTONIX ) 40 MG tablet Take 40 mg by mouth daily.     pregabalin  (LYRICA ) 75 MG capsule Take 75 mg by mouth 3 (three) times daily.     venlafaxine  XR (EFFEXOR -XR) 75 MG 24 hr capsule Take 75 mg by mouth daily.     Erenumab -aooe (AIMOVIG ) 140 MG/ML SOAJ Inject 140 mg into the skin every 30 (thirty) days. 1.12 mL 3   albuterol (PROVENTIL) (2.5 MG/3ML) 0.083% nebulizer solution      guaiFENesin  (ROBITUSSIN) 100 MG/5ML liquid Take 10 mLs by mouth every 4 (four) hours as needed for cough or to loosen phlegm.     senna-docusate (SENOKOT-S) 8.6-50 MG tablet Take 2 tablets by mouth at bedtime.     Vaginal Lubricant (REPLENS) GEL Place 1 Application vaginally at bedtime.     No facility-administered medications prior to visit.     PAST MEDICAL HISTORY: Past Medical History:  Diagnosis Date   Anemia    Anemia     Anxiety    Anxiety and depression    Bacteremia    Cellulitis    Chronic back pain    Chronic diarrhea    Chronic right hip pain    Complication of anesthesia    Depression    DVT (deep venous thrombosis) (HCC)    Dysphagia    GERD (gastroesophageal reflux disease)    Gout    Gout    Headache    Hyperlipidemia    Hypopotassemia    Hypothyroidism    IBS (irritable bowel syndrome)    Major depressive disorder    Muscle weakness (generalized)    Osteoporosis    Pneumonia    PONV (postoperative nausea and vomiting)    Pressure ulcer    Pressure ulcer of buttock    Psychosis (HCC)    Thyroid disease    hypothyroidism     PAST SURGICAL HISTORY: Past Surgical History:  Procedure Laterality Date   BIOPSY  05/10/2023   Procedure: BIOPSY;  Surgeon: Cinderella Deatrice FALCON, MD;  [facility]: [facility];  Service: Endoscopy;;   COLONOSCOPY  2007   Dr. Thomasina: Adenomatous colon polyp   COLONOSCOPY WITH PROPOFOL  N/A 05/11/2023   Procedure: COLONOSCOPY WITH PROPOFOL ;  Surgeon: Cinderella Deatrice FALCON, MD;  [facility]: [facility];  Service: Endoscopy;  Laterality: N/A;   ESOPHAGEAL DILATION N/A 09/25/2023   Procedure: DILATION, ESOPHAGUS;  Surgeon: Shaaron Lamar HERO, MD;  [facility]: [facility];  Service: Endoscopy;  Laterality: N/A;   ESOPHAGOGASTRODUODENOSCOPY N/A 07/09/2016   Procedure: ESOPHAGOGASTRODUODENOSCOPY (EGD);  Surgeon: Lamar HERO Shaaron, MD;  [facility]: [facility];  Service: Endoscopy;  Laterality: N/A;   ESOPHAGOGASTRODUODENOSCOPY N/A 09/25/2023   Procedure: EGD (ESOPHAGOGASTRODUODENOSCOPY);  Surgeon: Shaaron Lamar HERO, MD;  [facility]: [facility];  Service: Endoscopy;  Laterality: N/A;  8:15 AM, ASA 3  coming from [facility] and [employer]  ESOPHAGOGASTRODUODENOSCOPY (EGD) WITH PROPOFOL  N/A 04/18/2017   Procedure: ESOPHAGOGASTRODUODENOSCOPY (EGD) WITH PROPOFOL ;  Surgeon: Shaaron Lamar HERO, MD;  [facility]: [facility];  Service: Endoscopy;  Laterality: N/A;  10:30am    ESOPHAGOGASTRODUODENOSCOPY (EGD) WITH PROPOFOL  N/A 05/10/2023   Procedure: ESOPHAGOGASTRODUODENOSCOPY (EGD) WITH PROPOFOL ;  Surgeon: Cinderella Deatrice FALCON, MD;  [facility];  Service: Endoscopy;  Laterality: N/A;   GIVENS CAPSULE STUDY N/A 09/25/2023   Procedure: IMAGING PROCEDURE, GI TRACT, INTRALUMINAL, VIA CAPSULE;  Surgeon: Shaaron Lamar HERO, MD;  [facility]: [facility];  Service: Endoscopy;  Laterality: N/A;   HIP ARTHROPLASTY  05/01/2011   Procedure: right ARTHROPLASTY BIPOLAR HIP;  Surgeon: Taft Minerva, MD;  Location: [facility];  Service: Orthopedics;  Laterality: Right;   INCISION AND DRAINAGE HIP  02/08/2012   Procedure: IRRIGATION AND DEBRIDEMENT HIP;  Surgeon: Taft FORBES Minerva, MD;  Location: [facility];  Service: Orthopedics;  Laterality: Right;   INCISION AND DRAINAGE OF WOUND  05/24/2011   Procedure: IRRIGATION AND DEBRIDEMENT WOUND;  Surgeon: Taft Minerva, MD;  Location: [facility];  Service: Orthopedics;  Laterality: Right;   MALONEY DILATION N/A 04/18/2017   Procedure: AGAPITO DILATION;  Surgeon: Shaaron Lamar HERO, MD;  [facility]: [facility];  Service: Endoscopy;  Laterality: N/A;   POLYPECTOMY  05/11/2023   Procedure: POLYPECTOMY;  Surgeon: Cinderella Deatrice FALCON, MD;  [facility];  Service: Endoscopy;;     FAMILY HISTORY: Family History  Problem Relation Age of Onset   Migraines Mother      SOCIAL HISTORY: Social History   Socioeconomic History   Marital status: Single    Spouse name: Not on file   Number of children: Not on file   Years of education: Not on file   Highest education level: Not on file  Occupational History   Not on file  Tobacco Use   Smoking status: Never   Smokeless tobacco: Never  Vaping Use   Vaping status: Never Used  Substance and Sexual Activity   Alcohol  use: No   Drug use: No   Sexual activity: Never  Other Topics Concern   Not on file  Social History Narrative   03/16/21 lives at [facility]    Patient's legal guardian is Mr. Renay Louder. Phone number [phone].   Social Drivers of Corporate investment banker Strain: Not on file  Food Insecurity: No Food Insecurity (05/08/2023)   Hunger Vital Sign    Worried About Running Out of Food in the Last Year: Never true    Ran Out of Food in the Last Year: Never true  Transportation Needs: No Transportation Needs (05/08/2023)   PRAPARE - Administrator, Civil Service (Medical): No    Lack of Transportation (Non-Medical): No  Physical Activity: Not on file  Stress: Not on file  Social Connections: Patient Unable To Answer (05/09/2023)   Social Connection and Isolation Panel    Frequency of Communication with Friends and Family: Patient unable to answer    Frequency of Social Gatherings with Friends and Family: Patient unable to answer    Attends Religious Services: Patient unable to answer    Active Member of Clubs or Organizations: Patient unable to answer    Attends Banker Meetings: Patient unable to answer    Marital Status: Patient unable to answer  Intimate Partner Violence: Not At Risk (05/08/2023)   Humiliation, Afraid, Rape, and Kick questionnaire    Fear of Current or Ex-Partner: No  Emotionally Abused: No    Physically Abused: No    Sexually Abused: No     PHYSICAL EXAM  Vitals:   11/26/23 0847  BP: (!) 107/44  Pulse: 63  SpO2: 95%   There is no height or weight on file to calculate BMI.  Generalized: Well developed, in no acute distress, poor historian   Cardiology: normal rate and rhythm, no murmur auscultated  Respiratory: clear to auscultation bilaterally    Neurological examination  Mentation: Alert, oriented to person and limited history taking. Follows all commands speech and language fluent Cranial nerve II-XII: Pupils were equal round reactive to light. Extraocular movements were full, visual field were full on confrontational test. Facial sensation and strength were  normal. Uvula tongue midline. Head turning and shoulder shrug  were normal and symmetric. Motor: The motor testing reveals 5 over 5 strength of all 4 extremities. Good symmetric motor tone is noted throughout.  Sensory: Sensory testing is intact to soft touch on all 4 extremities. No evidence of extinction is noted.  Coordination: unable to perform  Gait and station: Gait not assessed   DIAGNOSTIC DATA (LABS, IMAGING, TESTING) - I reviewed patient records, labs, notes, testing and imaging myself where available.  Lab Results  Component Value Date   WBC 4.9 11/01/2023   HGB 12.3 11/01/2023   HCT 39.7 11/01/2023   MCV 91.9 11/01/2023   PLT 228 11/01/2023      Component Value Date/Time   NA 146 (H) 11/01/2023 0947   K 3.5 11/01/2023 0947   CL 106 11/01/2023 0947   CO2 28 11/01/2023 0947   GLUCOSE 114 (H) 11/01/2023 0947   BUN 15 11/01/2023 0947   CREATININE 0.61 11/01/2023 0947   CALCIUM  9.3 11/01/2023 0947   PROT 6.6 11/01/2023 0947   ALBUMIN 3.6 11/01/2023 0947   AST 16 11/01/2023 0947   ALT 12 11/01/2023 0947   ALKPHOS 105 11/01/2023 0947   BILITOT 0.7 11/01/2023 0947   GFRNONAA >60 11/01/2023 0947   GFRAA >60 04/18/2017 1119   No results found for: CHOL, HDL, LDLCALC, LDLDIRECT, TRIG, CHOLHDL No results found for: YHAJ8R Lab Results  Component Value Date   VITAMINB12 405 11/01/2023   Lab Results  Component Value Date   TSH 2.516 05/22/2012        No data to display               No data to display           ASSESSMENT AND PLAN  78 y.o. year old female  has a past medical history of Anemia, Anemia, Anxiety, Anxiety and depression, Bacteremia, Cellulitis, Chronic back pain, Chronic diarrhea, Chronic right hip pain, Complication of anesthesia, Depression, DVT (deep venous thrombosis) (HCC), Dysphagia, GERD (gastroesophageal reflux disease), Gout, Gout, Headache, Hyperlipidemia, Hypopotassemia, Hypothyroidism, IBS (irritable bowel  syndrome), Major depressive disorder, Muscle weakness (generalized), Osteoporosis, Pneumonia, PONV (postoperative nausea and vomiting), Pressure ulcer, Pressure ulcer of buttock, Psychosis (HCC), and Thyroid disease. here with    Meningioma (HCC)  Recurrent headache  At risk for dehydration  Polypharmacy  History of CVA (cerebrovascular accident)  AKIRAH STORCK continues to have daily headaches. We will switch Amovig to Emgality . Continue Tylenol  for abortive therapy. I have asked staff with [facility] and documented in facility orders to ask PCP to place referral to an academic center per Dr Obie previous request. Continue stroke prevention and comorbidity management with care team. Healthy lifestyle habits encouraged. She will follow up with PCP as  directed. She will return to see me in 6 months, sooner if needed. May transfer care to academic center once referral placed and she is seen. She verbalizes understanding and agreement with this plan.   No orders of the defined types were placed in this encounter.    Meds ordered this encounter  Medications   Galcanezumab -gnlm (EMGALITY ) 120 MG/ML SOAJ    Sig: Inject 120 mg into the skin every 30 (thirty) days.    Dispense:  3 mL    Refill:  3    Supervising Provider:   AHERN, ANTONIA B [id number]    I personally spent a total of 40 minutes in the care of the patient today including preparing to see the patient, getting/reviewing separately obtained history, performing a medically appropriate exam/evaluation, counseling and educating, placing orders, referring and communicating with other health care professionals, documenting clinical information in the EHR, independently interpreting results, communicating results, and coordinating care.   Greig Forbes, MSN, FNP-C 11/26/2023, 10:48 AM  [facility] [address], [address] [place], [place] [address] [phone]

## 2023-11-26 ENCOUNTER — Ambulatory Visit (INDEPENDENT_AMBULATORY_CARE_PROVIDER_SITE_OTHER): Payer: Medicare (Managed Care) | Admitting: Family Medicine

## 2023-11-26 ENCOUNTER — Encounter: Payer: Self-pay | Admitting: Family Medicine

## 2023-11-26 VITALS — BP 107/44 | HR 63

## 2023-11-26 DIAGNOSIS — Z8673 Personal history of transient ischemic attack (TIA), and cerebral infarction without residual deficits: Secondary | ICD-10-CM

## 2023-11-26 DIAGNOSIS — D329 Benign neoplasm of meninges, unspecified: Secondary | ICD-10-CM | POA: Diagnosis not present

## 2023-11-26 DIAGNOSIS — Z79899 Other long term (current) drug therapy: Secondary | ICD-10-CM

## 2023-11-26 DIAGNOSIS — Z9189 Other specified personal risk factors, not elsewhere classified: Secondary | ICD-10-CM

## 2023-11-26 DIAGNOSIS — R519 Headache, unspecified: Secondary | ICD-10-CM

## 2023-11-26 MED ORDER — EMGALITY 120 MG/ML ~~LOC~~ SOAJ: 120.0000 mg | SUBCUTANEOUS | 3 refills | Status: AC

## 2023-11-26 NOTE — Patient Instructions (Signed)
 Below is our plan:  We will switch your injection from Amovig to Emgality  every 30 days. Continue Tylenol  as needed for abortive therapy but try to avoid daily use.   I am asking your PCP at [facility] to refer you to an academic headache specialist. I would recommend considering [facility] or [facility]. [facility] is also an option for headache management.   Consider sleep evaluation. Sleep apnea could cause daily headaches.   Please make sure you are staying well hydrated. I recommend 50-60 ounces daily. Well balanced diet and regular exercise encouraged. Consistent sleep schedule with 6-8 hours recommended.   Please continue follow up with care team as directed.   Follow up with me in 6-8 months   You may receive a survey regarding today's visit. I encourage you to leave honest feed back as I do use this information to improve patient care. Thank you for seeing me today!   GENERAL HEADACHE INFORMATION:   Natural supplements: Magnesium  Oxide or Magnesium  Glycinate 500 mg at bed (up to 800 mg daily) Coenzyme Q10 300 mg in AM Vitamin B2- 200 mg twice a day   Add 1 supplement at a time since even natural supplements can have undesirable side effects. You can sometimes buy supplements cheaper (especially Coenzyme Q10) at www.WebmailGuide.co.za or at [facility].  Migraine with aura: There is increased risk for stroke in women with migraine with aura and a contraindication for the combined contraceptive pill for use by women who have migraine with aura. The risk for women with migraine without aura is lower. However other risk factors like smoking are far more likely to increase stroke risk than migraine. There is a recommendation for no smoking and for the use of OCPs without estrogen such as progestogen only pills particularly for women with migraine with aura.SABRA People who have migraine headaches with auras may be 3 times more likely to have a stroke caused by a blood clot,  compared to migraine patients who don't see auras. Women who take hormone-replacement therapy may be 30 percent more likely to suffer a clot-based stroke than women not taking medication containing estrogen. Other risk factors like smoking and high blood pressure may be  much more important.    Vitamins and herbs that show potential:   Magnesium : Magnesium  (250 mg twice a day or 500 mg at bed) has a relaxant effect on smooth muscles such as blood vessels. Individuals suffering from frequent or daily headache usually have low magnesium  levels which can be increase with daily supplementation of 400-750 mg. Three trials found 40-90% average headache reduction  when used as a preventative. Magnesium  may help with headaches are aura, the best evidence for magnesium  is for migraine with aura is its thought to stop the cortical spreading depression we believe is the pathophysiology of migraine aura.Magnesium  also demonstrated the benefit in menstrually related migraine.  Magnesium  is part of the messenger system in the serotonin cascade and it is a good muscle relaxant.  It is also useful for constipation which can be a side effect of other medications used to treat migraine. Good sources include nuts, whole grains, and tomatoes. Side Effects: loose stool/diarrhea  Riboflavin (vitamin B 2) 200 mg twice a day. This vitamin assists nerve cells in the production of ATP a principal energy storing molecule.  It is necessary for many chemical reactions in the body.  There have been at least 3 clinical trials of riboflavin using 400 mg per day all of which  suggested that migraine frequency can be decreased.  All 3 trials showed significant improvement in over half of migraine sufferers.  The supplement is found in bread, cereal, milk, meat, and poultry.  Most Americans get more riboflavin than the recommended daily allowance, however riboflavin deficiency is not necessary for the supplements to help prevent headache. Side  effects: energizing, green urine   Coenzyme Q10: This is present in almost all cells in the body and is critical component for the conversion of energy.  Recent studies have shown that a nutritional supplement of CoQ10 can reduce the frequency of migraine attacks by improving the energy production of cells as with riboflavin.  Doses of 150 mg twice a day have been shown to be effective.   Melatonin: Increasing evidence shows correlation between melatonin secretion and headache conditions.  Melatonin supplementation has decreased headache intensity and duration.  It is widely used as a sleep aid.  Sleep is natures way of dealing with migraine.  A dose of 3 mg is recommended to start for headaches including cluster headache. Higher doses up to 15 mg has been reviewed for use in Cluster headache and have been used. The rationale behind using melatonin for cluster is that many theories regarding the cause of Cluster headache center around the disruption of the normal circadian rhythm in the brain.  This helps restore the normal circadian rhythm.   HEADACHE DIET: Foods and beverages which may trigger migraine Note that only 20% of headache patients are food sensitive. You will know if you are food sensitive if you get a headache consistently 20 minutes to 2 hours after eating a certain food. Only cut out a food if it causes headaches, otherwise you might remove foods you enjoy! What matters most for diet is to eat a well balanced healthy diet full of vegetables and low fat protein, and to not miss meals.   Chocolate, other sweets ALL cheeses except cottage and cream cheese Dairy products, yogurt, sour cream, ice cream Liver Meat extracts (Bovril, Marmite, meat tenderizers) Meats or fish which have undergone aging, fermenting, pickling or smoking. These include: Hotdogs,salami,Lox,sausage, mortadellas,smoked salmon, pepperoni, Pickled herring Pods of broad bean (English beans, Chinese pea pods, [place]  (fava) beans, lima and navy beans Ripe avocado, ripe banana Yeast extracts or active yeast preparations such as Brewer's or Fleishman's (commercial bakes goods are permitted) Tomato based foods, pizza (lasagna, etc.)   MSG (monosodium glutamate) is disguised as many things; look for these common aliases: Monopotassium glutamate Autolysed yeast Hydrolysed protein Sodium caseinate "flavorings" "all natural preservatives Nutrasweet   Avoid all other foods that convincingly provoke headaches.   Resources: The Dizzy Bluford Aid Your Headache Diet, migrainestrong.com  https://zamora-andrews.com/   Caffeine and Migraine For patients that have migraine, caffeine intake more than 3 days per week can lead to dependency and increased migraine frequency. I would recommend cutting back on your caffeine intake as best you can. The recommended amount of caffeine is 200-300 mg daily, although migraine patients may experience dependency at even lower doses. While you may notice an increase in headache temporarily, cutting back will be helpful for headaches in the long run. For more information on caffeine and migraine, visit: https://americanmigrainefoundation.org/resource-library/caffeine-and-migraine/   Headache Prevention Strategies:   1. Maintain a headache diary; learn to identify and avoid triggers.  - This can be a simple note where you log when you had a headache, associated symptoms, and medications used - There are several smartphone apps developed to help track migraines: Migraine  Buddy, Migraine Monitor, Curelator N1-Headache App   Common triggers include: Emotional triggers: Emotional/Upset family or friends Emotional/Upset occupation Business reversal/success Anticipation anxiety Crisis-serious Post-crisis periodNew job/position   Physical triggers: Vacation Day Weekend Strenuous Exercise High Altitude Location New Move Menstrual  Day Physical Illness Oversleep/Not enough sleep Weather changes Light: Photophobia or light sesnitivity treatment involves a balance between desensitization and reduction in overly strong input. Use dark polarized glasses outside, but not inside. Avoid bright or fluorescent light, but do not dim environment to the point that going into a normally lit room hurts. Consider FL-41 tint lenses, which reduce the most irritating wavelengths without blocking too much light.  These can be obtained at axonoptics.com or theraspecs.com Foods: see list above.   2. Limit use of acute treatments (over-the-counter medications, triptans, etc.) to no more than 2 days per week or 10 days per month to prevent medication overuse headache (rebound headache).     3. Follow a regular schedule (including weekends and holidays): Don't skip meals. Eat a balanced diet. 8 hours of sleep nightly. Minimize stress. Exercise 30 minutes per day. Being overweight is associated with a 5 times increased risk of chronic migraine. Keep well hydrated and drink 6-8 glasses of water  per day.   4. Initiate non-pharmacologic measures at the earliest onset of your headache. Rest and quiet environment. Relax and reduce stress. Breathe2Relax is a free app that can instruct you on    some simple relaxtion and breathing techniques. Http://Dawnbuse.com is a    free website that provides teaching videos on relaxation.  Also, there are  many apps that   can be downloaded for "mindful" relaxation.  An app called YOGA NIDRA will help walk you through mindfulness. Another app called Calm can be downloaded to give you a structured mindfulness guide with daily reminders and skill development. Headspace for guided meditation Mindfulness Based Stress Reduction Online Course: www.palousemindfulness.com Cold compresses.   5. Don't wait!! Take the maximum allowable dosage of prescribed medication at the first sign of migraine.   6. Compliance:  Take  prescribed medication regularly as directed and at the first sign of a migraine.   7. Communicate:  Call your physician when problems arise, especially if your headaches change, increase in frequency/severity, or become associated with neurological symptoms (weakness, numbness, slurred speech, etc.). Proceed to emergency room if you experience new or worsening symptoms or symptoms do not resolve, if you have new neurologic symptoms or if headache is severe, or for any concerning symptom.   8. Headache/pain management therapies: Consider various complementary methods, including medication, behavioral therapy, psychological counselling, biofeedback, massage therapy, acupuncture, dry needling, and other modalities.  Such measures may reduce the need for medications. Counseling for pain management, where patients learn to function and ignore/minimize their pain, seems to work very well.   9. Recommend changing family's attention and focus away from patient's headaches. Instead, emphasize daily activities. If first question of day is 'How are your headaches/Do you have a headache today?', then patient will constantly think about headaches, thus making them worse. Goal is to re-direct attention away from headaches, toward daily activities and other distractions.   10. Helpful Websites: www.AmericanHeadacheSociety.org PatentHood.ch www.headaches.org TightMarket.nl www.achenet.org

## 2023-12-05 ENCOUNTER — Ambulatory Visit (HOSPITAL_COMMUNITY)
Admission: RE | Admit: 2023-12-05 | Discharge: 2023-12-05 | Disposition: A | Discharge status: Home / Self Care | Payer: Medicare (Managed Care) | Source: Ambulatory Visit | Attending: Gastroenterology | Admitting: Gastroenterology

## 2023-12-05 DIAGNOSIS — K921 Melena: Secondary | ICD-10-CM | POA: Insufficient documentation

## 2023-12-05 DIAGNOSIS — D509 Iron deficiency anemia, unspecified: Secondary | ICD-10-CM | POA: Insufficient documentation

## 2023-12-05 MED ORDER — IOHEXOL 300 MG/ML  SOLN
100.0000 mL | Freq: Once | INTRAMUSCULAR | Status: AC | PRN
Start: 2023-12-05 — End: 2023-12-05
  Administered 2023-12-05: 100 mL via INTRAVENOUS

## 2023-12-13 ENCOUNTER — Ambulatory Visit: Payer: Self-pay | Admitting: Gastroenterology

## 2024-01-14 ENCOUNTER — Ambulatory Visit: Payer: Medicare (Managed Care) | Admitting: Internal Medicine

## 2024-01-15 ENCOUNTER — Encounter: Payer: Self-pay | Admitting: Internal Medicine

## 2024-03-05 ENCOUNTER — Inpatient Hospital Stay: Payer: Medicare (Managed Care) | Attending: Oncology

## 2024-03-05 DIAGNOSIS — D500 Iron deficiency anemia secondary to blood loss (chronic): Secondary | ICD-10-CM

## 2024-03-05 DIAGNOSIS — K259 Gastric ulcer, unspecified as acute or chronic, without hemorrhage or perforation: Secondary | ICD-10-CM | POA: Diagnosis not present

## 2024-03-05 DIAGNOSIS — K5909 Other constipation: Secondary | ICD-10-CM | POA: Diagnosis not present

## 2024-03-05 DIAGNOSIS — K449 Diaphragmatic hernia without obstruction or gangrene: Secondary | ICD-10-CM | POA: Diagnosis not present

## 2024-03-05 LAB — COMPREHENSIVE METABOLIC PANEL WITH GFR
ALT: 16 U/L (ref 0–44)
AST: 27 U/L (ref 15–41)
Albumin: 4.4 g/dL (ref 3.5–5.0)
Alkaline Phosphatase: 106 U/L (ref 38–126)
Anion gap: 5 (ref 5–15)
BUN: 14 mg/dL (ref 8–23)
CO2: 35 mmol/L — ABNORMAL HIGH (ref 22–32)
Calcium: 9.5 mg/dL (ref 8.9–10.3)
Chloride: 108 mmol/L (ref 98–111)
Creatinine, Ser: 0.65 mg/dL (ref 0.44–1.00)
GFR, Estimated: >60 mL/min (ref >60)
Glucose, Bld: 93 mg/dL (ref 70–99)
Potassium: 4.1 mmol/L (ref 3.5–5.1)
Sodium: 148 mmol/L — ABNORMAL HIGH (ref 135–145)
Total Bilirubin: 0.2 mg/dL (ref 0.0–1.2)
Total Protein: 7.1 g/dL (ref 6.5–8.1)

## 2024-03-05 LAB — IRON AND TIBC
Iron: 58 ug/dL (ref 28–170)
Saturation Ratios: 18 % (ref 10.4–31.8)
TIBC: 321 ug/dL (ref 250–450)
UIBC: 263 ug/dL

## 2024-03-05 LAB — CBC WITH DIFFERENTIAL/PLATELET
Abs Immature Granulocytes: 0.02 K/uL (ref 0.00–0.07)
Basophils Absolute: 0 K/uL (ref 0.0–0.1)
Basophils Relative: 1 %
Eosinophils Absolute: 0.2 K/uL (ref 0.0–0.5)
Eosinophils Relative: 3 %
HCT: 39.9 % (ref 36.0–46.0)
Hemoglobin: 12.3 g/dL (ref 12.0–15.0)
Immature Granulocytes: 0 %
Lymphocytes Relative: 23 %
Lymphs Abs: 1.3 K/uL (ref 0.7–4.0)
MCH: 29.6 pg (ref 26.0–34.0)
MCHC: 30.8 g/dL (ref 30.0–36.0)
MCV: 95.9 fL (ref 80.0–100.0)
Monocytes Absolute: 0.6 K/uL (ref 0.1–1.0)
Monocytes Relative: 10 %
Neutro Abs: 3.7 K/uL (ref 1.7–7.7)
Neutrophils Relative %: 63 %
Platelets: 231 K/uL (ref 150–400)
RBC: 4.16 MIL/uL (ref 3.87–5.11)
RDW: 14.8 % (ref 11.5–15.5)
WBC: 5.8 K/uL (ref 4.0–10.5)
nRBC: 0 % (ref 0.0–0.2)

## 2024-03-05 LAB — FOLATE: Folate: 11.1 ng/mL (ref >5.9)

## 2024-03-05 LAB — FERRITIN: Ferritin: 59 ng/mL (ref 11–307)

## 2024-03-05 LAB — VITAMIN B12: Vitamin B-12: 741 pg/mL (ref 180–914)

## 2024-03-12 ENCOUNTER — Inpatient Hospital Stay (HOSPITAL_BASED_OUTPATIENT_CLINIC_OR_DEPARTMENT_OTHER): Payer: Medicare (Managed Care) | Admitting: Oncology

## 2024-03-12 VITALS — BP 99/54 | HR 74 | Temp 98.7°F | Resp 18

## 2024-03-12 DIAGNOSIS — D500 Iron deficiency anemia secondary to blood loss (chronic): Secondary | ICD-10-CM | POA: Diagnosis not present

## 2024-03-12 NOTE — Assessment & Plan Note (Addendum)
 Iron  deficiency anemia likely secondary to [facility] lesions and hiatal hernia.   Was recently admitted to the hospital requiring blood transfusions.   S/p IV iron  infusion with significant improvement Resolved anemia and iron  deficiency at this time  Patient does have chronic constipation and uses pain medication.  Will not recommend oral iron  supplementation Recommended to eat healthy with proteins and greens Continue to follow with GI  Return to clinic in 4 months with labs

## 2024-03-12 NOTE — Progress Notes (Signed)
" ° °  [name] OFFICE PROGRESS NOTE  Terri Leisure, MD  ASSESSMENT & PLAN:    Assessment & Plan Iron  deficiency anemia due to chronic blood loss Iron  deficiency anemia likely secondary to [facility] lesions and hiatal hernia.   Was recently admitted to the hospital requiring blood transfusions.   S/p IV iron  infusion with significant improvement Resolved anemia and iron  deficiency at this time  Patient does have chronic constipation and uses pain medication.  Will not recommend oral iron  supplementation Recommended to eat healthy with proteins and greens Continue to follow with GI  Return to clinic in 4 months with labs  Orders Placed This Encounter  Procedures   Ferritin    Standing Status:   Future    Expected Date:   07/12/2024    Expiration Date:   03/18/2025   Folate    Standing Status:   Future    Expected Date:   07/12/2024    Expiration Date:   03/18/2025   Vitamin B12    Standing Status:   Future    Expected Date:   07/12/2024    Expiration Date:   03/18/2025   CBC with Differential/Platelet    Standing Status:   Future    Expected Date:   07/12/2024    Expiration Date:   03/18/2025   Comprehensive metabolic panel with GFR    Standing Status:   Future    Expected Date:   07/12/2024    Expiration Date:   03/18/2025   Iron  and TIBC    Standing Status:   Future    Expected Date:   07/12/2024    Expiration Date:   03/18/2025    INTERVAL HISTORY: Patient returns for follow-up after receiving IV iron .   In the interim she had a upper endoscopy on 09/25/23 which showed normal esophagus, medium size hiatal hernia and normal duodenal bulb and second portion of the duodenum.  No specimens were collected.  Most recent colonoscopy is from 05/11/2023 which showed three 7 to 9 mm polyps in sigmoid colon, diverticulosis and nonbleeding external and internal hemorrhoids.  Due to [facility] lesions in setting of large hiatal hernia.  She is currently on Protonix .  She has  follow-up with GI on 03/24/2024.   Patient tolerated 1 g Monoferric  well on 11/14/2023.  She is currently not on oral iron  secondary to constipation.  Reports improvement of her energy levels following iron  infusion although they have decreased some over the past few months.  Reports an appetite of 75% energy levels are low.  She has a chronic cough which is stable.  Has occasional diarrhea.  Has dizziness with headaches at times especially when she stands.  Has stable chronic depression.  We reviewed CBC, CMP, iron  panel, vitamin B12, folate and ferritin.  SUMMARY OF HEMATOLOGIC HISTORY: Iron  deficient anemia likely secondary to GI bleeding - S/p IV iron -Monoferric  1000 mg on 06/10/2023 and 11/14/2023. - Endoscopic colonoscopy consistent with a probable [place] lesion  Lab Results  Component Value Date   HGB 12.3 03/05/2024   FERRITIN 59 03/05/2024   VITAMINB12 741 03/05/2024    Vitals:   03/12/24 1040  BP: (!) 99/54  Pulse: 74  Resp: 18  Temp: 98.7 F (37.1 C)  SpO2: 97%    I spent 25 minutes dedicated to the care of this patient (face-to-face and non-face-to-face) on the date of the encounter to include what is described in the assessment and plan.,  Terri Hope, NP 03/18/2024 9:02 AM "

## 2024-03-18 ENCOUNTER — Encounter: Payer: Self-pay | Admitting: Oncology

## 2024-03-24 ENCOUNTER — Encounter: Payer: Self-pay | Admitting: Internal Medicine

## 2024-03-24 ENCOUNTER — Ambulatory Visit (INDEPENDENT_AMBULATORY_CARE_PROVIDER_SITE_OTHER): Payer: Medicare (Managed Care) | Admitting: Internal Medicine

## 2024-03-24 VITALS — BP 118/73 | HR 66 | Temp 98.2°F | Ht 67.0 in

## 2024-03-24 DIAGNOSIS — R197 Diarrhea, unspecified: Secondary | ICD-10-CM | POA: Diagnosis not present

## 2024-03-24 DIAGNOSIS — D509 Iron deficiency anemia, unspecified: Secondary | ICD-10-CM

## 2024-03-24 DIAGNOSIS — Z8719 Personal history of other diseases of the digestive system: Secondary | ICD-10-CM

## 2024-03-24 DIAGNOSIS — R1319 Other dysphagia: Secondary | ICD-10-CM

## 2024-03-24 NOTE — Patient Instructions (Signed)
 Dicyclomine  as needed for diarrhea  Continue pantoprazole  40 mg once daily  No further GI evaluation unless new symptoms develop.

## 2024-03-24 NOTE — Progress Notes (Signed)
 "   Gastroenterology Progress Note    Primary Care Physician:  Isaiah Leisure, MD Primary Gastroenterologist:  Dr. Shaaron  Pre-Procedure History & Physical: HPI:  [name] is a 78 y.o. female here for follow-up of iron  deficiency anemia dysphagia and abnormal small bowel capsule study.  Nonbleeding erosions seen on capsule study previously.  CTE demonstrated a somewhat featureless distal colon-nonspecific finding could be inflammatory or infectious.  EGD and colonoscopy without significant findings and dysphagia resolved since she underwent empiric passage of a Maloney dilator.  History of iron  deficiency.  However, labs from 12/11 CBC normal H&H 12.3/39.9.  Ferritin 59.  Patient denies any abdominal pain.  According to the record, she continues on Eliquis.   Past Medical History:  Diagnosis Date   Anemia    Anemia    Anxiety    Anxiety and depression    Bacteremia    Cellulitis    Chronic back pain    Chronic diarrhea    Chronic right hip pain    Complication of anesthesia    Depression    DVT (deep venous thrombosis) (HCC)    Dysphagia    GERD (gastroesophageal reflux disease)    Gout    Gout    Headache    Hyperlipidemia    Hypopotassemia    Hypothyroidism    IBS (irritable bowel syndrome)    Major depressive disorder    Muscle weakness (generalized)    Osteoporosis    Pneumonia    PONV (postoperative nausea and vomiting)    Pressure ulcer    Pressure ulcer of buttock    Psychosis (HCC)    Thyroid disease    hypothyroidism    Past Surgical History:  Procedure Laterality Date   BIOPSY  05/10/2023   Procedure: BIOPSY;  Surgeon: Cinderella Deatrice FALCON, MD;  [facility];  Service: Endoscopy;;   COLONOSCOPY  2007   Dr. Thomasina: Adenomatous colon polyp   COLONOSCOPY WITH PROPOFOL  N/A 05/11/2023   Procedure: COLONOSCOPY WITH PROPOFOL ;  Surgeon: Cinderella Deatrice FALCON, MD;  [facility]: [facility];  Service: Endoscopy;  Laterality: N/A;   ESOPHAGEAL DILATION N/A  09/25/2023   Procedure: DILATION, ESOPHAGUS;  Surgeon: Shaaron Lamar HERO, MD;  [facility]: [facility];  Service: Endoscopy;  Laterality: N/A;   ESOPHAGOGASTRODUODENOSCOPY N/A 07/09/2016   Procedure: ESOPHAGOGASTRODUODENOSCOPY (EGD);  Surgeon: Lamar HERO Shaaron, MD;  [facility];  Service: Endoscopy;  Laterality: N/A;   ESOPHAGOGASTRODUODENOSCOPY N/A 09/25/2023   Procedure: EGD (ESOPHAGOGASTRODUODENOSCOPY);  Surgeon: Shaaron Lamar HERO, MD;  [facility];  Service: Endoscopy;  Laterality: N/A;  8:15 AM, ASA 3  coming from [facility]   ESOPHAGOGASTRODUODENOSCOPY (EGD) WITH PROPOFOL  N/A 04/18/2017   Procedure: ESOPHAGOGASTRODUODENOSCOPY (EGD) WITH PROPOFOL ;  Surgeon: Shaaron Lamar HERO, MD;  [facility];  Service: Endoscopy;  Laterality: N/A;  10:30am   ESOPHAGOGASTRODUODENOSCOPY (EGD) WITH PROPOFOL  N/A 05/10/2023   Procedure: ESOPHAGOGASTRODUODENOSCOPY (EGD) WITH PROPOFOL ;  Surgeon: Cinderella Deatrice FALCON, MD;  [facility];  Service: Endoscopy;  Laterality: N/A;   GIVENS CAPSULE STUDY N/A 09/25/2023   Procedure: IMAGING PROCEDURE, GI TRACT, INTRALUMINAL, VIA CAPSULE;  Surgeon: Shaaron Lamar HERO, MD;  [facility];  Service: Endoscopy;  Laterality: N/A;   HIP ARTHROPLASTY  05/01/2011   Procedure: right ARTHROPLASTY BIPOLAR HIP;  Surgeon: Taft Minerva, MD;  Location: [facility];  Service: Orthopedics;  Laterality: Right;   INCISION AND DRAINAGE HIP  02/08/2012   Procedure: IRRIGATION  AND DEBRIDEMENT HIP;  Surgeon: Taft FORBES Minerva, MD;  Location: [facility];  Service: Orthopedics;  Laterality: Right;   INCISION AND DRAINAGE OF WOUND  05/24/2011   Procedure: IRRIGATION AND DEBRIDEMENT WOUND;  Surgeon: Taft Minerva, MD;  Location: [facility];  Service: Orthopedics;  Laterality: Right;   MALONEY DILATION N/A 04/18/2017   Procedure: AGAPITO DILATION;  Surgeon: Shaaron Lamar HERO, MD;  [facility];  Service: Endoscopy;  Laterality:  N/A;   POLYPECTOMY  05/11/2023   Procedure: POLYPECTOMY;  Surgeon: Cinderella Deatrice FALCON, MD;  [facility];  Service: Endoscopy;;    Prior to Admission medications  Medication Sig Start Date End Date Taking? Authorizing Provider  acetaminophen  (TYLENOL ) 325 MG tablet Take 650 mg by mouth every 6 (six) hours as needed for fever.    Yes [provider]  atorvastatin  (LIPITOR) 10 MG tablet Take 10 mg by mouth at bedtime.    Yes [provider]  brimonidine (ALPHAGAN) 0.2 % ophthalmic solution Place 1 drop into both eyes 3 (three) times daily.   Yes [provider]  busPIRone (BUSPAR) 7.5 MG tablet Take 7.5 mg by mouth 2 (two) times daily. 02/25/24  Yes [provider]  calcitonin, salmon, (MIACALCIN /FORTICAL) 200 UNIT/ACT nasal spray Place 1 spray into alternate nostrils daily. 05/05/23  Yes [provider]  cholecalciferol  (CHOLECALCIFEROL ) 25 MCG tablet Take 1 tablet (1,000 Units total) by mouth daily. 05/14/23  Yes Johnson, Clanford L, MD  cyanocobalamin  1000 MCG tablet Take 1,000 mcg by mouth daily.   Yes [provider]  dicyclomine  (BENTYL ) 20 MG tablet Take 20 mg by mouth 4 (four) times daily. 06/19/21  Yes [provider]  ELIQUIS 5 MG TABS tablet Take 5 mg by mouth 2 (two) times daily. 05/06/23  Yes [provider]  ferrous gluconate (FERGON) 324 MG tablet Take 324 mg by mouth daily with breakfast.   Yes [provider]  fluticasone (FLONASE) 50 MCG/ACT nasal spray Place into both nostrils. 01/22/24  Yes [provider]  Galcanezumab -gnlm (EMGALITY ) 120 MG/ML SOAJ Inject 120 mg into the skin every 30 (thirty) days. 11/26/23  Yes Lomax, Amy, NP  HYDROcodone -acetaminophen  (NORCO/VICODIN) 5-325 MG tablet Take 1 tablet by mouth every 12 (twelve) hours as needed for moderate pain (pain score 4-6).   Yes [provider]  latanoprost (XALATAN) 0.005 % ophthalmic solution Place 1 drop into both eyes at  bedtime.   Yes [provider]  levothyroxine  (SYNTHROID , LEVOTHROID) 50 MCG tablet Take 50 mcg by mouth daily before breakfast.    Yes [provider]  loperamide  (IMODIUM ) 2 MG capsule Take 2 mg by mouth as needed for diarrhea or loose stools.   Yes [provider]  LORazepam  (ATIVAN ) 0.5 MG tablet Take 2 tablets (1 mg total) by mouth 2 (two) times daily as needed for anxiety. 05/13/23  Yes Johnson, Clanford L, MD  melatonin 3 MG TABS tablet Take 3 mg by mouth at bedtime.   Yes [provider]  memantine  (NAMENDA ) 10 MG tablet Take 10 mg by mouth 2 (two) times daily.   Yes [provider]  NURTEC 75 MG TBDP Take by mouth. 02/02/24  Yes [provider]  OLANZapine  (ZYPREXA ) 5 MG tablet Take by mouth. 02/25/24  Yes [provider]  ondansetron  (ZOFRAN ) 4 MG tablet Take 4 mg by mouth every 8 (eight) hours as needed. 02/13/24  Yes [provider]  pantoprazole  (PROTONIX ) 40 MG tablet Take 40 mg by mouth  daily. 04/10/23  Yes [provider]  pregabalin  (LYRICA ) 75 MG capsule Take 75 mg by mouth 3 (three) times daily. 05/31/23  Yes [provider]  PREMARIN vaginal cream Place 1 applicator vaginally daily. 03/19/24  Yes [provider]  simethicone  (MYLICON) 80 MG chewable tablet Chew 80 mg by mouth every 6 (six) hours as needed for flatulence.   Yes [provider]  venlafaxine  XR (EFFEXOR -XR) 37.5 MG 24 hr capsule Take 37.5 mg by mouth daily with breakfast. 02/22/24  Yes [provider]    Allergies as of 03/24/2024 - Review Complete 03/24/2024  Allergen Reaction Noted   Diphenhydramine  Other (See Comments) 04/30/2011   Tape Dermatitis 11/20/2013    Family History  Problem Relation Age of Onset   Migraines Mother     Social History   Socioeconomic History   Marital status: Single    Spouse name: Not on file   Number of children: Not on file   Years of education: Not on file    Highest education level: Not on file  Occupational History   Not on file  Tobacco Use   Smoking status: Never   Smokeless tobacco: Never  Vaping Use   Vaping status: Never Used  Substance and Sexual Activity   Alcohol  use: No   Drug use: No   Sexual activity: Never  Other Topics Concern   Not on file  Social History Narrative   [birth year] lives at [facility]   Patient's legal guardian is Mr. Renay Louder. Phone number [phone].   Social Drivers of Health   Tobacco Use: Low Risk (03/24/2024)   Patient History    Smoking Tobacco Use: Never    Smokeless Tobacco Use: Never    Passive Exposure: Not on file  Financial Resource Strain: Not on file  Food Insecurity: No Food Insecurity (05/08/2023)   Hunger Vital Sign    Worried About Running Out of Food in the Last Year: Never true    Ran Out of Food in the Last Year: Never true  Transportation Needs: No Transportation Needs (05/08/2023)   PRAPARE - Administrator, Civil Service (Medical): No    Lack of Transportation (Non-Medical): No  Physical Activity: Not on file  Stress: Not on file  Social Connections: Patient Unable To Answer (05/09/2023)   Social Connection and Isolation Panel    Frequency of Communication with Friends and Family: Patient unable to answer    Frequency of Social Gatherings with Friends and Family: Patient unable to answer    Attends Religious Services: Patient unable to answer    Active Member of Clubs or Organizations: Patient unable to answer    Attends Banker Meetings: Patient unable to answer    Marital Status: Patient unable to answer  Intimate Partner Violence: Not At Risk (05/08/2023)   Humiliation, Afraid, Rape, and Kick questionnaire    Fear of Current or Ex-Partner: No    Emotionally Abused: No    Physically Abused: No    Sexually Abused: No  Depression (PHQ2-9): Medium Risk (03/12/2024)   Depression (PHQ2-9)    PHQ-2 Score: 5  Alcohol  Screen: Not on  file  Housing: Low Risk (05/10/2023)   Housing Stability Vital Sign    Unable to Pay for Housing in the Last Year: No    Number of Times Moved in the Last Year: 0    Homeless in the Last Year: No  Utilities: Not At Risk (05/08/2023)   [facility] Utilities  Threatened with loss of utilities: No  Health Literacy: Not on file    Review of Systems   See HPI, otherwise negative ROS  Physical Exam: BP 118/73   Pulse 66   Temp 98.2 F (36.8 C) (Oral)   Ht 5' 7 (1.702 m)   SpO2 95%   BMI 26.62 kg/m  General:   Alert,  pleasant and cooperative in NAD.  Sitting in a wheelchair. Neck:  Supple; no masses or thyromegaly. No significant cervical adenopathy. Lungs:  Clear throughout to auscultation.   No wheezes, crackles, or rhonchi. No acute distress. Heart:  Regular rate and rhythm; no murmurs, clicks, rubs,  or gallops. Abdomen: Non-distended, normal bowel sounds.  Soft and nontender without appreciable mass or hepatosplenomegaly.    Impression/Plan:   Pleasant 78 year old lady with dementia /psychosis evaluated earlier this year for iron  deficiency.  EGD colonoscopy essentially negative  - dysphagia resolved with empiric passage of a Maloney dilator.  Nonbleeding erosions nonspecific on capsule study.  CTE demonstrated somewhat featureless distal which is nonspecific.  Does have occasional diarrhea managed well with dicyclomine .  Denies abdominal pain actually patient is doing pretty well at this point in time iron  deficiency has resolved.  I suspect colonoscopy findings on CT nonspecific likely not clinically significant at this point in time -it is good to know she had a colonoscopy within the last year.  Recommendations:  Dicyclomine  as needed for diarrhea  Continue pantoprazole  40 mg once daily  No further GI evaluation unless new symptoms develop.     Notice: This dictation was prepared with Dragon dictation along with smaller phrase technology. Any transcriptional errors that  result from this process are unintentional and may not be corrected upon review.  "

## 2024-07-07 ENCOUNTER — Ambulatory Visit: Payer: Medicare (Managed Care) | Admitting: Family Medicine

## 2024-07-09 ENCOUNTER — Inpatient Hospital Stay: Payer: Medicare (Managed Care)

## 2024-07-16 ENCOUNTER — Inpatient Hospital Stay: Payer: Medicare (Managed Care) | Admitting: Oncology
